# Patient Record
Sex: Male | Born: 1945 | Race: White | Hispanic: No | Smoking: Current every day smoker
Health system: Southern US, Community
[De-identification: ages and names within clinical notes are randomized; demographics above are authoritative.]

## PROBLEM LIST (undated history)

## (undated) DIAGNOSIS — I671 Cerebral aneurysm, nonruptured: Secondary | ICD-10-CM

## (undated) DIAGNOSIS — L0390 Cellulitis, unspecified: Secondary | ICD-10-CM

## (undated) DIAGNOSIS — E1140 Type 2 diabetes mellitus with diabetic neuropathy, unspecified: Secondary | ICD-10-CM

## (undated) DIAGNOSIS — J42 Unspecified chronic bronchitis: Secondary | ICD-10-CM

## (undated) DIAGNOSIS — H5440 Blindness, one eye, unspecified eye: Secondary | ICD-10-CM

## (undated) DIAGNOSIS — E119 Type 2 diabetes mellitus without complications: Secondary | ICD-10-CM

## (undated) DIAGNOSIS — M719 Bursopathy, unspecified: Secondary | ICD-10-CM

## (undated) DIAGNOSIS — E785 Hyperlipidemia, unspecified: Secondary | ICD-10-CM

## (undated) DIAGNOSIS — I10 Essential (primary) hypertension: Secondary | ICD-10-CM

---

## 1999-07-19 ENCOUNTER — Emergency Department (HOSPITAL_COMMUNITY): Admission: EM | Admit: 1999-07-19 | Discharge: 1999-07-19 | Payer: Self-pay | Admitting: Emergency Medicine

## 1999-07-20 ENCOUNTER — Emergency Department (HOSPITAL_COMMUNITY): Admission: EM | Admit: 1999-07-20 | Discharge: 1999-07-20 | Payer: Self-pay | Admitting: Emergency Medicine

## 2006-08-28 ENCOUNTER — Inpatient Hospital Stay (HOSPITAL_COMMUNITY): Admission: EM | Admit: 2006-08-28 | Discharge: 2006-08-30 | Payer: Self-pay | Admitting: Emergency Medicine

## 2015-04-08 ENCOUNTER — Encounter (HOSPITAL_COMMUNITY): Payer: Self-pay | Admitting: *Deleted

## 2015-04-08 ENCOUNTER — Emergency Department (HOSPITAL_COMMUNITY): Payer: Medicare Other

## 2015-04-08 ENCOUNTER — Inpatient Hospital Stay (HOSPITAL_COMMUNITY)
Admission: EM | Admit: 2015-04-08 | Discharge: 2015-04-15 | DRG: 871 | Disposition: A | Discharge status: Home Health | Payer: Medicare Other | Attending: Internal Medicine | Admitting: Internal Medicine

## 2015-04-08 DIAGNOSIS — I70209 Unspecified atherosclerosis of native arteries of extremities, unspecified extremity: Secondary | ICD-10-CM | POA: Diagnosis present

## 2015-04-08 DIAGNOSIS — Z7982 Long term (current) use of aspirin: Secondary | ICD-10-CM | POA: Diagnosis not present

## 2015-04-08 DIAGNOSIS — Z880 Allergy status to penicillin: Secondary | ICD-10-CM | POA: Diagnosis not present

## 2015-04-08 DIAGNOSIS — Z833 Family history of diabetes mellitus: Secondary | ICD-10-CM | POA: Diagnosis not present

## 2015-04-08 DIAGNOSIS — E872 Acidosis: Secondary | ICD-10-CM | POA: Diagnosis present

## 2015-04-08 DIAGNOSIS — I10 Essential (primary) hypertension: Secondary | ICD-10-CM | POA: Diagnosis present

## 2015-04-08 DIAGNOSIS — R739 Hyperglycemia, unspecified: Secondary | ICD-10-CM

## 2015-04-08 DIAGNOSIS — I714 Abdominal aortic aneurysm, without rupture: Secondary | ICD-10-CM | POA: Diagnosis present

## 2015-04-08 DIAGNOSIS — E1165 Type 2 diabetes mellitus with hyperglycemia: Secondary | ICD-10-CM | POA: Diagnosis present

## 2015-04-08 DIAGNOSIS — R7881 Bacteremia: Principal | ICD-10-CM | POA: Diagnosis present

## 2015-04-08 DIAGNOSIS — F17210 Nicotine dependence, cigarettes, uncomplicated: Secondary | ICD-10-CM | POA: Diagnosis present

## 2015-04-08 DIAGNOSIS — E860 Dehydration: Secondary | ICD-10-CM | POA: Diagnosis present

## 2015-04-08 DIAGNOSIS — I350 Nonrheumatic aortic (valve) stenosis: Secondary | ICD-10-CM | POA: Diagnosis present

## 2015-04-08 DIAGNOSIS — IMO0002 Reserved for concepts with insufficient information to code with codable children: Secondary | ICD-10-CM | POA: Insufficient documentation

## 2015-04-08 DIAGNOSIS — A419 Sepsis, unspecified organism: Secondary | ICD-10-CM | POA: Diagnosis not present

## 2015-04-08 DIAGNOSIS — M79651 Pain in right thigh: Secondary | ICD-10-CM | POA: Diagnosis not present

## 2015-04-08 DIAGNOSIS — E871 Hypo-osmolality and hyponatremia: Secondary | ICD-10-CM | POA: Diagnosis present

## 2015-04-08 DIAGNOSIS — J209 Acute bronchitis, unspecified: Secondary | ICD-10-CM | POA: Diagnosis present

## 2015-04-08 DIAGNOSIS — H5441 Blindness, right eye, normal vision left eye: Secondary | ICD-10-CM | POA: Diagnosis present

## 2015-04-08 DIAGNOSIS — F10239 Alcohol dependence with withdrawal, unspecified: Secondary | ICD-10-CM | POA: Diagnosis present

## 2015-04-08 DIAGNOSIS — F1010 Alcohol abuse, uncomplicated: Secondary | ICD-10-CM | POA: Diagnosis not present

## 2015-04-08 DIAGNOSIS — R400 Somnolence: Secondary | ICD-10-CM | POA: Diagnosis not present

## 2015-04-08 DIAGNOSIS — R29898 Other symptoms and signs involving the musculoskeletal system: Secondary | ICD-10-CM | POA: Diagnosis present

## 2015-04-08 DIAGNOSIS — B9562 Methicillin resistant Staphylococcus aureus infection as the cause of diseases classified elsewhere: Secondary | ICD-10-CM | POA: Diagnosis present

## 2015-04-08 DIAGNOSIS — E1169 Type 2 diabetes mellitus with other specified complication: Secondary | ICD-10-CM | POA: Diagnosis present

## 2015-04-08 DIAGNOSIS — I671 Cerebral aneurysm, nonruptured: Secondary | ICD-10-CM | POA: Diagnosis present

## 2015-04-08 DIAGNOSIS — G9340 Encephalopathy, unspecified: Secondary | ICD-10-CM | POA: Diagnosis present

## 2015-04-08 DIAGNOSIS — E785 Hyperlipidemia, unspecified: Secondary | ICD-10-CM | POA: Diagnosis present

## 2015-04-08 DIAGNOSIS — F1020 Alcohol dependence, uncomplicated: Secondary | ICD-10-CM | POA: Diagnosis not present

## 2015-04-08 DIAGNOSIS — E1310 Other specified diabetes mellitus with ketoacidosis without coma: Secondary | ICD-10-CM | POA: Diagnosis not present

## 2015-04-08 DIAGNOSIS — Z5329 Procedure and treatment not carried out because of patient's decision for other reasons: Secondary | ICD-10-CM | POA: Diagnosis present

## 2015-04-08 DIAGNOSIS — Z720 Tobacco use: Secondary | ICD-10-CM | POA: Diagnosis present

## 2015-04-08 DIAGNOSIS — M25559 Pain in unspecified hip: Secondary | ICD-10-CM

## 2015-04-08 DIAGNOSIS — E1110 Type 2 diabetes mellitus with ketoacidosis without coma: Secondary | ICD-10-CM | POA: Diagnosis present

## 2015-04-08 DIAGNOSIS — A4901 Methicillin susceptible Staphylococcus aureus infection, unspecified site: Secondary | ICD-10-CM | POA: Diagnosis not present

## 2015-04-08 DIAGNOSIS — E876 Hypokalemia: Secondary | ICD-10-CM | POA: Diagnosis not present

## 2015-04-08 DIAGNOSIS — M729 Fibroblastic disorder, unspecified: Secondary | ICD-10-CM | POA: Diagnosis present

## 2015-04-08 DIAGNOSIS — R011 Cardiac murmur, unspecified: Secondary | ICD-10-CM | POA: Diagnosis not present

## 2015-04-08 HISTORY — DX: Essential (primary) hypertension: I10

## 2015-04-08 HISTORY — DX: Blindness, one eye, unspecified eye: H54.40

## 2015-04-08 HISTORY — DX: Cerebral aneurysm, nonruptured: I67.1

## 2015-04-08 HISTORY — DX: Hyperlipidemia, unspecified: E78.5

## 2015-04-08 LAB — BASIC METABOLIC PANEL
ANION GAP: 20 — AB (ref 5–15)
Anion gap: 17 — ABNORMAL HIGH (ref 5–15)
BUN: 13 mg/dL (ref 6–20)
BUN: 15 mg/dL (ref 6–20)
CALCIUM: 9.7 mg/dL (ref 8.9–10.3)
CALCIUM: 9 mg/dL (ref 8.9–10.3)
CHLORIDE: 93 mmol/L — AB (ref 101–111)
CO2: 18 mmol/L — AB (ref 22–32)
CO2: 18 mmol/L — ABNORMAL LOW (ref 22–32)
CREATININE: 1.08 mg/dL (ref 0.61–1.24)
Chloride: 96 mmol/L — ABNORMAL LOW (ref 101–111)
Creatinine, Ser: 1.29 mg/dL — ABNORMAL HIGH (ref 0.61–1.24)
GFR calc non Af Amer: 55 mL/min — ABNORMAL LOW (ref >60)
Glucose, Bld: 424 mg/dL — ABNORMAL HIGH (ref 65–99)
Glucose, Bld: 297 mg/dL — ABNORMAL HIGH (ref 65–99)
Potassium: 4.8 mmol/L (ref 3.5–5.1)
Potassium: 4.2 mmol/L (ref 3.5–5.1)
SODIUM: 131 mmol/L — AB (ref 135–145)
Sodium: 131 mmol/L — ABNORMAL LOW (ref 135–145)

## 2015-04-08 LAB — CBG MONITORING, ED
GLUCOSE-CAPILLARY: 456 mg/dL — AB (ref 65–99)
GLUCOSE-CAPILLARY: 337 mg/dL — AB (ref 65–99)
Glucose-Capillary: 447 mg/dL — ABNORMAL HIGH (ref 65–99)
Glucose-Capillary: 235 mg/dL — ABNORMAL HIGH (ref 65–99)
Glucose-Capillary: 236 mg/dL — ABNORMAL HIGH (ref 65–99)

## 2015-04-08 LAB — CBC
HCT: 47.4 % (ref 39.0–52.0)
HEMOGLOBIN: 17 g/dL (ref 13.0–17.0)
MCH: 31.8 pg (ref 26.0–34.0)
MCHC: 35.9 g/dL (ref 30.0–36.0)
MCV: 88.6 fL (ref 78.0–100.0)
Platelets: 223 10*3/uL (ref 150–400)
RBC: 5.35 MIL/uL (ref 4.22–5.81)
RDW: 12.7 % (ref 11.5–15.5)
WBC: 17.7 10*3/uL — ABNORMAL HIGH (ref 4.0–10.5)

## 2015-04-08 LAB — ETHANOL: Alcohol, Ethyl (B): <5 mg/dL (ref <5)

## 2015-04-08 LAB — I-STAT CG4 LACTIC ACID, ED
LACTIC ACID, VENOUS: 3 mmol/L — AB (ref 0.5–2.0)
Lactic Acid, Venous: 1.14 mmol/L (ref 0.5–2.0)

## 2015-04-08 LAB — HEPATIC FUNCTION PANEL
ALBUMIN: 3 g/dL — AB (ref 3.5–5.0)
ALT: 13 U/L — AB (ref 17–63)
AST: 31 U/L (ref 15–41)
Alkaline Phosphatase: 75 U/L (ref 38–126)
BILIRUBIN DIRECT: 0.1 mg/dL (ref 0.1–0.5)
BILIRUBIN TOTAL: 0.9 mg/dL (ref 0.3–1.2)
Indirect Bilirubin: 0.8 mg/dL (ref 0.3–0.9)
Total Protein: 6 g/dL — ABNORMAL LOW (ref 6.5–8.1)

## 2015-04-08 LAB — URINALYSIS, ROUTINE W REFLEX MICROSCOPIC
Ketones, ur: >80 mg/dL — AB
Nitrite: NEGATIVE
Specific Gravity, Urine: 1.026 (ref 1.005–1.030)
Urobilinogen, UA: 0.2 mg/dL (ref 0.0–1.0)
pH: 5.5 (ref 5.0–8.0)

## 2015-04-08 LAB — LIPASE, BLOOD: Lipase: 12 U/L — ABNORMAL LOW (ref 22–51)

## 2015-04-08 LAB — URINE MICROSCOPIC-ADD ON

## 2015-04-08 LAB — AMMONIA: Ammonia: 19 umol/L (ref 9–35)

## 2015-04-08 MED ORDER — SODIUM CHLORIDE 0.9 % IV BOLUS (SEPSIS)
500.0000 mL | Freq: Once | INTRAVENOUS | Status: AC
  Administered 2015-04-08: 500 mL via INTRAVENOUS

## 2015-04-08 MED ORDER — KETOROLAC TROMETHAMINE 30 MG/ML IJ SOLN
15.0000 mg | Freq: Once | INTRAMUSCULAR | Status: AC
  Administered 2015-04-09: 15 mg via INTRAVENOUS
  Filled 2015-04-08: qty 1

## 2015-04-08 MED ORDER — INSULIN ASPART 100 UNIT/ML ~~LOC~~ SOLN
10.0000 [IU] | Freq: Once | SUBCUTANEOUS | Status: AC
  Administered 2015-04-08: 10 [IU] via INTRAVENOUS
  Filled 2015-04-08: qty 1

## 2015-04-08 MED ORDER — THIAMINE HCL 100 MG/ML IJ SOLN
Freq: Once | INTRAVENOUS | Status: AC
  Administered 2015-04-09: 04:00:00 via INTRAVENOUS
  Filled 2015-04-08: qty 1000

## 2015-04-08 MED ORDER — ASPIRIN EC 81 MG PO TBEC
81.0000 mg | DELAYED_RELEASE_TABLET | Freq: Every day | ORAL | Status: DC
  Administered 2015-04-09 – 2015-04-15 (×6): 81 mg via ORAL
  Filled 2015-04-08 (×7): qty 1

## 2015-04-08 MED ORDER — METFORMIN HCL 500 MG PO TABS: 1000.0000 mg | ORAL_TABLET | Freq: Two times a day (BID) | ORAL | Status: DC

## 2015-04-08 MED ORDER — SODIUM CHLORIDE 0.9 % IV SOLN
INTRAVENOUS | Status: DC
  Administered 2015-04-08: 20:00:00 via INTRAVENOUS

## 2015-04-08 MED ORDER — LORAZEPAM 2 MG/ML IJ SOLN: 1.0000 mg | Freq: Once | INTRAMUSCULAR | Status: AC

## 2015-04-08 MED ORDER — METOPROLOL SUCCINATE ER 50 MG PO TB24
50.0000 mg | ORAL_TABLET | Freq: Every day | ORAL | Status: DC
  Administered 2015-04-09: 50 mg via ORAL
  Filled 2015-04-08: qty 1

## 2015-04-08 MED ORDER — LINAGLIPTIN 5 MG PO TABS: 5.0000 mg | ORAL_TABLET | Freq: Every day | ORAL | Status: DC

## 2015-04-08 MED ORDER — ENOXAPARIN SODIUM 40 MG/0.4ML ~~LOC~~ SOLN
40.0000 mg | Freq: Every day | SUBCUTANEOUS | Status: DC
  Administered 2015-04-09 – 2015-04-15 (×7): 40 mg via SUBCUTANEOUS
  Filled 2015-04-08 (×7): qty 0.4

## 2015-04-08 MED ORDER — ONDANSETRON HCL 4 MG PO TABS: 4.0000 mg | ORAL_TABLET | Freq: Four times a day (QID) | ORAL | Status: DC | PRN

## 2015-04-08 MED ORDER — VITAMIN B-1 100 MG PO TABS
100.0000 mg | ORAL_TABLET | Freq: Every day | ORAL | Status: DC
  Administered 2015-04-09: 100 mg via ORAL
  Filled 2015-04-08: qty 1

## 2015-04-08 MED ORDER — FAMOTIDINE 20 MG PO TABS
20.0000 mg | ORAL_TABLET | Freq: Two times a day (BID) | ORAL | Status: DC
  Administered 2015-04-09 (×2): 20 mg via ORAL
  Filled 2015-04-08 (×2): qty 1

## 2015-04-08 MED ORDER — ONDANSETRON HCL 4 MG/2ML IJ SOLN: 4.0000 mg | Freq: Four times a day (QID) | INTRAMUSCULAR | Status: DC | PRN

## 2015-04-08 NOTE — ED Notes (Signed)
Pt incontinent of urine, pt cleaned and dried.  Red area noted at rectum
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
## 2015-04-08 NOTE — ED Notes (Signed)
CBG 236. 

## 2015-04-08 NOTE — ED Notes (Signed)
Pt friend reports rt leg pain and difficulty walking that has worsened in the last 2-3 days. Pt also reports "error" reading on CBG machine and has not been checking blood sugars. Denies any dizziness or lightheadedness. Pt reports increased urination.

## 2015-04-09 ENCOUNTER — Inpatient Hospital Stay (HOSPITAL_COMMUNITY): Payer: Medicare Other

## 2015-04-09 ENCOUNTER — Encounter (HOSPITAL_COMMUNITY): Payer: Self-pay

## 2015-04-09 DIAGNOSIS — R509 Fever, unspecified: Secondary | ICD-10-CM | POA: Insufficient documentation

## 2015-04-09 DIAGNOSIS — D72829 Elevated white blood cell count, unspecified: Secondary | ICD-10-CM | POA: Insufficient documentation

## 2015-04-09 DIAGNOSIS — E1310 Other specified diabetes mellitus with ketoacidosis without coma: Secondary | ICD-10-CM

## 2015-04-09 DIAGNOSIS — R29898 Other symptoms and signs involving the musculoskeletal system: Secondary | ICD-10-CM

## 2015-04-09 DIAGNOSIS — R011 Cardiac murmur, unspecified: Secondary | ICD-10-CM

## 2015-04-09 DIAGNOSIS — Z720 Tobacco use: Secondary | ICD-10-CM | POA: Diagnosis present

## 2015-04-09 DIAGNOSIS — J209 Acute bronchitis, unspecified: Secondary | ICD-10-CM | POA: Insufficient documentation

## 2015-04-09 DIAGNOSIS — E785 Hyperlipidemia, unspecified: Secondary | ICD-10-CM | POA: Diagnosis present

## 2015-04-09 DIAGNOSIS — G9340 Encephalopathy, unspecified: Secondary | ICD-10-CM

## 2015-04-09 DIAGNOSIS — F1010 Alcohol abuse, uncomplicated: Secondary | ICD-10-CM

## 2015-04-09 DIAGNOSIS — I10 Essential (primary) hypertension: Secondary | ICD-10-CM | POA: Diagnosis present

## 2015-04-09 DIAGNOSIS — E1110 Type 2 diabetes mellitus with ketoacidosis without coma: Secondary | ICD-10-CM | POA: Diagnosis present

## 2015-04-09 DIAGNOSIS — E1165 Type 2 diabetes mellitus with hyperglycemia: Secondary | ICD-10-CM

## 2015-04-09 LAB — GLUCOSE, CAPILLARY
Glucose-Capillary: 333 mg/dL — ABNORMAL HIGH (ref 65–99)
Glucose-Capillary: 213 mg/dL — ABNORMAL HIGH (ref 65–99)
Glucose-Capillary: 189 mg/dL — ABNORMAL HIGH (ref 65–99)
Glucose-Capillary: 280 mg/dL — ABNORMAL HIGH (ref 65–99)

## 2015-04-09 LAB — COMPREHENSIVE METABOLIC PANEL
ALBUMIN: 2.7 g/dL — AB (ref 3.5–5.0)
ALK PHOS: 72 U/L (ref 38–126)
ALT: 13 U/L — ABNORMAL LOW (ref 17–63)
ANION GAP: 14 (ref 5–15)
AST: 25 U/L (ref 15–41)
BILIRUBIN TOTAL: 1 mg/dL (ref 0.3–1.2)
BUN: 16 mg/dL (ref 6–20)
CALCIUM: 8.6 mg/dL — AB (ref 8.9–10.3)
CO2: 19 mmol/L — ABNORMAL LOW (ref 22–32)
Chloride: 97 mmol/L — ABNORMAL LOW (ref 101–111)
Creatinine, Ser: 1.04 mg/dL (ref 0.61–1.24)
GFR calc non Af Amer: >60 mL/min (ref >60)
Glucose, Bld: 322 mg/dL — ABNORMAL HIGH (ref 65–99)
POTASSIUM: 4 mmol/L (ref 3.5–5.1)
SODIUM: 130 mmol/L — AB (ref 135–145)
TOTAL PROTEIN: 5.4 g/dL — AB (ref 6.5–8.1)

## 2015-04-09 LAB — CBC
HEMATOCRIT: 43 % (ref 39.0–52.0)
HEMOGLOBIN: 15.1 g/dL (ref 13.0–17.0)
MCH: 31 pg (ref 26.0–34.0)
MCHC: 35.1 g/dL (ref 30.0–36.0)
MCV: 88.3 fL (ref 78.0–100.0)
Platelets: 206 10*3/uL (ref 150–400)
RBC: 4.87 MIL/uL (ref 4.22–5.81)
RDW: 12.9 % (ref 11.5–15.5)
WBC: 16.5 10*3/uL — ABNORMAL HIGH (ref 4.0–10.5)

## 2015-04-09 LAB — BASIC METABOLIC PANEL
ANION GAP: 11 (ref 5–15)
Anion gap: 10 (ref 5–15)
BUN: 15 mg/dL (ref 6–20)
BUN: 14 mg/dL (ref 6–20)
CHLORIDE: 100 mmol/L — AB (ref 101–111)
CO2: 21 mmol/L — AB (ref 22–32)
CO2: 18 mmol/L — AB (ref 22–32)
Calcium: 8.7 mg/dL — ABNORMAL LOW (ref 8.9–10.3)
Calcium: 8.3 mg/dL — ABNORMAL LOW (ref 8.9–10.3)
Chloride: 101 mmol/L (ref 101–111)
Creatinine, Ser: 0.88 mg/dL (ref 0.61–1.24)
Creatinine, Ser: 0.85 mg/dL (ref 0.61–1.24)
GFR calc Af Amer: >60 mL/min (ref >60)
GFR calc non Af Amer: >60 mL/min (ref >60)
GFR calc non Af Amer: >60 mL/min (ref >60)
GLUCOSE: 225 mg/dL — AB (ref 65–99)
GLUCOSE: 207 mg/dL — AB (ref 65–99)
POTASSIUM: 3.5 mmol/L (ref 3.5–5.1)
Potassium: 3.7 mmol/L (ref 3.5–5.1)
SODIUM: 131 mmol/L — AB (ref 135–145)
Sodium: 130 mmol/L — ABNORMAL LOW (ref 135–145)

## 2015-04-09 LAB — VITAMIN B12: Vitamin B-12: 572 pg/mL (ref 180–914)

## 2015-04-09 LAB — TSH: TSH: 0.947 u[IU]/mL (ref 0.350–4.500)

## 2015-04-09 LAB — PHOSPHORUS: PHOSPHORUS: 3.2 mg/dL (ref 2.5–4.6)

## 2015-04-09 LAB — CK: CK TOTAL: 338 U/L (ref 49–397)

## 2015-04-09 LAB — MAGNESIUM: Magnesium: 1.5 mg/dL — ABNORMAL LOW (ref 1.7–2.4)

## 2015-04-09 MED ORDER — LORAZEPAM 2 MG/ML IJ SOLN
0.0000 mg | Freq: Four times a day (QID) | INTRAMUSCULAR | Status: AC
  Administered 2015-04-09 (×2): 2 mg via INTRAVENOUS
  Filled 2015-04-09 (×4): qty 1

## 2015-04-09 MED ORDER — POTASSIUM CHLORIDE 2 MEQ/ML IV SOLN
INTRAVENOUS | Status: DC
  Administered 2015-04-09 (×2): via INTRAVENOUS
  Filled 2015-04-09 (×4): qty 1000

## 2015-04-09 MED ORDER — SODIUM CHLORIDE 0.9 % IV SOLN
1500.0000 mg | Freq: Once | INTRAVENOUS | Status: AC
  Administered 2015-04-09: 1500 mg via INTRAVENOUS
  Filled 2015-04-09: qty 1500

## 2015-04-09 MED ORDER — THIAMINE HCL 100 MG/ML IJ SOLN
100.0000 mg | Freq: Every day | INTRAMUSCULAR | Status: DC
  Administered 2015-04-10 – 2015-04-12 (×3): 100 mg via INTRAVENOUS
  Filled 2015-04-09 (×3): qty 2

## 2015-04-09 MED ORDER — IPRATROPIUM-ALBUTEROL 0.5-2.5 (3) MG/3ML IN SOLN
3.0000 mL | Freq: Two times a day (BID) | RESPIRATORY_TRACT | Status: DC
  Administered 2015-04-10 – 2015-04-14 (×8): 3 mL via RESPIRATORY_TRACT
  Filled 2015-04-09 (×10): qty 3

## 2015-04-09 MED ORDER — IPRATROPIUM-ALBUTEROL 0.5-2.5 (3) MG/3ML IN SOLN: 3.0000 mL | RESPIRATORY_TRACT | Status: DC | PRN

## 2015-04-09 MED ORDER — LEVOFLOXACIN IN D5W 750 MG/150ML IV SOLN
750.0000 mg | INTRAVENOUS | Status: DC
  Administered 2015-04-09: 750 mg via INTRAVENOUS
  Filled 2015-04-09 (×2): qty 150

## 2015-04-09 MED ORDER — INSULIN ASPART 100 UNIT/ML ~~LOC~~ SOLN
0.0000 [IU] | Freq: Every day | SUBCUTANEOUS | Status: DC
  Administered 2015-04-09: 3 [IU] via SUBCUTANEOUS
  Administered 2015-04-12 – 2015-04-13 (×2): 2 [IU] via SUBCUTANEOUS
  Administered 2015-04-14: 3 [IU] via SUBCUTANEOUS

## 2015-04-09 MED ORDER — SODIUM CHLORIDE 0.9 % IV BOLUS (SEPSIS)
500.0000 mL | Freq: Once | INTRAVENOUS | Status: AC
  Administered 2015-04-09: 500 mL via INTRAVENOUS

## 2015-04-09 MED ORDER — METOPROLOL TARTRATE 1 MG/ML IV SOLN
2.5000 mg | Freq: Four times a day (QID) | INTRAVENOUS | Status: DC
  Administered 2015-04-10: 2.5 mg via INTRAVENOUS
  Filled 2015-04-09: qty 5

## 2015-04-09 MED ORDER — ADULT MULTIVITAMIN W/MINERALS CH
1.0000 | ORAL_TABLET | Freq: Every day | ORAL | Status: DC
  Administered 2015-04-09: 1 via ORAL
  Filled 2015-04-09: qty 1

## 2015-04-09 MED ORDER — POTASSIUM CHLORIDE IN NACL 20-0.9 MEQ/L-% IV SOLN
INTRAVENOUS | Status: DC
  Administered 2015-04-10 – 2015-04-11 (×5): via INTRAVENOUS
  Filled 2015-04-09 (×4): qty 1000

## 2015-04-09 MED ORDER — FOLIC ACID 1 MG PO TABS
1.0000 mg | ORAL_TABLET | Freq: Every day | ORAL | Status: DC
  Administered 2015-04-09: 1 mg via ORAL
  Filled 2015-04-09: qty 1

## 2015-04-09 MED ORDER — LORAZEPAM 2 MG/ML IJ SOLN
0.0000 mg | Freq: Two times a day (BID) | INTRAMUSCULAR | Status: DC
  Administered 2015-04-10 – 2015-04-11 (×2): 2 mg via INTRAVENOUS
  Filled 2015-04-09 (×2): qty 1

## 2015-04-09 MED ORDER — NICOTINE 21 MG/24HR TD PT24
21.0000 mg | MEDICATED_PATCH | Freq: Every day | TRANSDERMAL | Status: DC | PRN
  Administered 2015-04-13: 21 mg via TRANSDERMAL
  Filled 2015-04-09: qty 1

## 2015-04-09 MED ORDER — INSULIN ASPART 100 UNIT/ML ~~LOC~~ SOLN
0.0000 [IU] | SUBCUTANEOUS | Status: DC
  Administered 2015-04-09: 11 [IU] via SUBCUTANEOUS
  Administered 2015-04-09: 5 [IU] via SUBCUTANEOUS

## 2015-04-09 MED ORDER — IPRATROPIUM-ALBUTEROL 0.5-2.5 (3) MG/3ML IN SOLN
3.0000 mL | Freq: Four times a day (QID) | RESPIRATORY_TRACT | Status: DC
  Administered 2015-04-09 (×2): 3 mL via RESPIRATORY_TRACT
  Filled 2015-04-09 (×3): qty 3

## 2015-04-09 MED ORDER — LORAZEPAM 1 MG PO TABS: 1.0000 mg | ORAL_TABLET | Freq: Four times a day (QID) | ORAL | Status: AC | PRN

## 2015-04-09 MED ORDER — INSULIN ASPART 100 UNIT/ML ~~LOC~~ SOLN
0.0000 [IU] | SUBCUTANEOUS | Status: DC
  Administered 2015-04-09: 2 [IU] via SUBCUTANEOUS
  Administered 2015-04-10 (×2): 3 [IU] via SUBCUTANEOUS
  Administered 2015-04-10: 2 [IU] via SUBCUTANEOUS
  Administered 2015-04-10: 5 [IU] via SUBCUTANEOUS
  Administered 2015-04-10 (×2): 3 [IU] via SUBCUTANEOUS
  Administered 2015-04-11: 2 [IU] via SUBCUTANEOUS
  Administered 2015-04-11 (×2): 1 [IU] via SUBCUTANEOUS
  Administered 2015-04-12: 7 [IU] via SUBCUTANEOUS
  Administered 2015-04-12: 2 [IU] via SUBCUTANEOUS
  Administered 2015-04-12: 1 [IU] via SUBCUTANEOUS
  Administered 2015-04-12: 2 [IU] via SUBCUTANEOUS
  Administered 2015-04-12: 3 [IU] via SUBCUTANEOUS
  Administered 2015-04-12: 5 [IU] via SUBCUTANEOUS
  Administered 2015-04-13 (×2): 3 [IU] via SUBCUTANEOUS
  Administered 2015-04-13: 5 [IU] via SUBCUTANEOUS
  Administered 2015-04-13 – 2015-04-14 (×2): 2 [IU] via SUBCUTANEOUS
  Administered 2015-04-14: 3 [IU] via SUBCUTANEOUS
  Administered 2015-04-14: 5 [IU] via SUBCUTANEOUS

## 2015-04-09 MED ORDER — VANCOMYCIN HCL IN DEXTROSE 1-5 GM/200ML-% IV SOLN
1000.0000 mg | Freq: Two times a day (BID) | INTRAVENOUS | Status: DC
  Administered 2015-04-09 – 2015-04-11 (×5): 1000 mg via INTRAVENOUS
  Filled 2015-04-09 (×7): qty 200

## 2015-04-09 MED ORDER — FAMOTIDINE IN NACL 20-0.9 MG/50ML-% IV SOLN
20.0000 mg | Freq: Two times a day (BID) | INTRAVENOUS | Status: DC
  Administered 2015-04-09 – 2015-04-11 (×5): 20 mg via INTRAVENOUS
  Filled 2015-04-09 (×8): qty 50

## 2015-04-09 MED ORDER — LORAZEPAM 2 MG/ML IJ SOLN
1.0000 mg | Freq: Four times a day (QID) | INTRAMUSCULAR | Status: AC | PRN
  Administered 2015-04-09 – 2015-04-11 (×2): 1 mg via INTRAVENOUS
  Filled 2015-04-09 (×2): qty 1

## 2015-04-09 MED ORDER — INSULIN ASPART 100 UNIT/ML ~~LOC~~ SOLN: 0.0000 [IU] | Freq: Every day | SUBCUTANEOUS | Status: DC

## 2015-04-09 MED ORDER — INSULIN GLARGINE 100 UNIT/ML ~~LOC~~ SOLN
10.0000 [IU] | Freq: Every day | SUBCUTANEOUS | Status: DC
  Administered 2015-04-09: 10 [IU] via SUBCUTANEOUS
  Filled 2015-04-09 (×2): qty 0.1

## 2015-04-09 MED ORDER — INSULIN ASPART 100 UNIT/ML ~~LOC~~ SOLN: 0.0000 [IU] | Freq: Three times a day (TID) | SUBCUTANEOUS | Status: DC

## 2015-04-09 NOTE — Progress Notes (Signed)
Pt came from ED to room 6N26; pt is asleep; daughter at bedside. Will continue to monitor
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
## 2015-04-09 NOTE — Progress Notes (Signed)
  Echocardiogram 2D Echocardiogram has been performed.  Andrew [name] 04/09/2015, 9:32 AM

## 2015-04-09 NOTE — Progress Notes (Signed)
CRITICAL VALUE ALERT  Critical value received: 2725 Date of notification:  04/09/15  Time of notification: 0852  Critical value read back: yes  Nurse who received alert: Jeralene Peters MD notified (1st page):  D. Tat Time of first page: [phone] MD notified (2nd page):  Time of second page:  Responding MD: D. Tat Time MD responded:  [phone]

## 2015-04-09 NOTE — Progress Notes (Signed)
Inpatient Diabetes Program Recommendations  AACE/ADA: New Consensus Statement on Inpatient Glycemic Control (2013)  Target Ranges:  Prepandial:   less than 140 mg/dL      Peak postprandial:   less than 180 mg/dL (1-2 hours)      Critically ill patients:  140 - 180 mg/dL   Review of Glycemic Control:  Results for VISHAAL, STROLLO (MRN [id number]) as of 04/09/2015 11:36  Ref. Range 04/08/2015 18:20 04/08/2015 19:58 04/08/2015 23:07 04/09/2015 07:48 04/09/2015 11:04  Glucose-Capillary Latest Ref Range: 65-99 mg/dL 337 (H) 235 (H) 236 (H) 333 (H) 213 (H)   A1C pending.  Diabetes history: Type 2 diabetes Outpatient Diabetes medications: Januvia 100 mg daily and Metformin 1000 mg bid Current orders for Inpatient glycemic control:  Novolog moderate tid with meals and HS  Please consider starting basal insulin while patient is in the hospital.  Consider Lantus 16 units daily (0.2 unit/kg).  While NPO, consider increasing Novolog frequency to q 4 hours and change scale to sensitive.    Note that patient was on Metformin at home prior to admit, however due to history of ETOH abuse, may consider not restarting Metformin at discharge?  Will follow.  Thanks, Adah Perl, RN, BC-ADM Inpatient Diabetes Coordinator Pager [phone] (8a-5p)
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
## 2015-04-09 NOTE — Progress Notes (Signed)
Utilization review completed. Terik Haughey, RN, BSN. 

## 2015-04-09 NOTE — Progress Notes (Signed)
ANTIBIOTIC CONSULT NOTE - INITIAL  Pharmacy Consult for vancomycin Indication: Bacteremia  Allergies  Allergen Reactions  . Penicillins Rash and Other (See Comments)    Possibly rash?    Patient Measurements: Weight: 182 lb 5.1 oz (82.7 kg)  Vital Signs: Temp: 98.3 F (36.8 C) (08/23 0538) Temp Source: Oral (08/23 0538) BP: 154/65 mmHg (08/23 0538) Pulse Rate: 78 (08/23 0538) Intake/Output from previous day: 08/22 0701 - 08/23 0700 In: 555.4 [P.O.:220; I.V.:185.4; IV Piggyback:150] Out: -  Intake/Output from this shift: Total I/O In: 0  Out: 200 [Urine:200]  Labs:  Recent Labs  04/08/15 1416 04/08/15 1916 04/09/15 0502  WBC 17.7*  --  16.5*  HGB 17.0  --  15.1  PLT 223  --  206  CREATININE 1.29* 1.08 1.04   CrCl cannot be calculated (Unknown ideal weight.). No results for input(s): VANCOTROUGH, VANCOPEAK, VANCORANDOM, GENTTROUGH, GENTPEAK, GENTRANDOM, TOBRATROUGH, TOBRAPEAK, TOBRARND, AMIKACINPEAK, AMIKACINTROU, AMIKACIN in the last 72 hours.   Microbiology: Recent Results (from the past 720 hour(s))  Culture, blood (routine x 2)     Status: None (Preliminary result)   Collection Time: 04/08/15  8:15 PM  Result Value Ref Range Status   Specimen Description BLOOD RIGHT ARM  Final   Special Requests BOTTLES DRAWN AEROBIC AND ANAEROBIC 10ML  Final   Culture  Setup Time   Final    GRAM POSITIVE COCCI IN CLUSTERS ANAEROBIC BOTTLE ONLY CRITICAL RESULT CALLED TO, READ BACK BY AND VERIFIED WITH: J. BOZEMAN,RN AT 0848 ON [id number] BY Rhea Bleacher    Culture GRAM POSITIVE COCCI  Final   Report Status PENDING  Incomplete    Medical History: Past Medical History  Diagnosis Date  . Diabetes mellitus without complication   . Hypertension   . Hyperlipemia   . Blind right eye    Assessment: 69 yo M presents on 8/22 with R leg pain and DM. Levaquin was started yesterday for acute bronchitis and low grade fever. Found to have 1/2 positive blood cx's for GPC in clusters  so pharmacy consulted to add vancomycin. Now afebrile, WBC elevated at 16.5. SCr 1.04, CrCl ~70-29ml/min.  Goal of Therapy:  Vancomycin trough level 15-20 mcg/ml  Resolution of infection  Plan:  Give vancomycin 1500mg  IV x 1, then start 1g IV Q12H Continue Levaquin 750mg  IV Q24 per MD Monitor clinical picture, renal function, VT prn F/U C&S, abx deescalation / LOT  Lucindia Lemley J 04/09/2015,9:05 AM

## 2015-04-10 ENCOUNTER — Inpatient Hospital Stay (HOSPITAL_COMMUNITY): Payer: Medicare Other

## 2015-04-10 DIAGNOSIS — B9561 Methicillin susceptible Staphylococcus aureus infection as the cause of diseases classified elsewhere: Secondary | ICD-10-CM

## 2015-04-10 DIAGNOSIS — R7881 Bacteremia: Secondary | ICD-10-CM | POA: Diagnosis present

## 2015-04-10 DIAGNOSIS — F1020 Alcohol dependence, uncomplicated: Secondary | ICD-10-CM

## 2015-04-10 DIAGNOSIS — I70209 Unspecified atherosclerosis of native arteries of extremities, unspecified extremity: Secondary | ICD-10-CM | POA: Diagnosis present

## 2015-04-10 DIAGNOSIS — M79651 Pain in right thigh: Secondary | ICD-10-CM | POA: Diagnosis present

## 2015-04-10 DIAGNOSIS — Z720 Tobacco use: Secondary | ICD-10-CM

## 2015-04-10 DIAGNOSIS — A4901 Methicillin susceptible Staphylococcus aureus infection, unspecified site: Secondary | ICD-10-CM

## 2015-04-10 LAB — RAPID URINE DRUG SCREEN, HOSP PERFORMED
AMPHETAMINES: NOT DETECTED
BENZODIAZEPINES: POSITIVE — AB
Barbiturates: NOT DETECTED
Cocaine: NOT DETECTED
OPIATES: NOT DETECTED
Tetrahydrocannabinol: NOT DETECTED

## 2015-04-10 LAB — BASIC METABOLIC PANEL
ANION GAP: 16 — AB (ref 5–15)
BUN: 13 mg/dL (ref 6–20)
CO2: 17 mmol/L — AB (ref 22–32)
Calcium: 8.5 mg/dL — ABNORMAL LOW (ref 8.9–10.3)
Chloride: 100 mmol/L — ABNORMAL LOW (ref 101–111)
Creatinine, Ser: 1.03 mg/dL (ref 0.61–1.24)
GFR calc Af Amer: >60 mL/min (ref >60)
GLUCOSE: 297 mg/dL — AB (ref 65–99)
POTASSIUM: 3.6 mmol/L (ref 3.5–5.1)
Sodium: 133 mmol/L — ABNORMAL LOW (ref 135–145)

## 2015-04-10 LAB — CBC
HEMATOCRIT: 42.3 % (ref 39.0–52.0)
HEMOGLOBIN: 14.9 g/dL (ref 13.0–17.0)
MCH: 31.2 pg (ref 26.0–34.0)
MCHC: 35.2 g/dL (ref 30.0–36.0)
MCV: 88.5 fL (ref 78.0–100.0)
Platelets: 191 10*3/uL (ref 150–400)
RBC: 4.78 MIL/uL (ref 4.22–5.81)
RDW: 13.1 % (ref 11.5–15.5)
WBC: 12.7 10*3/uL — ABNORMAL HIGH (ref 4.0–10.5)

## 2015-04-10 LAB — GLUCOSE, CAPILLARY
GLUCOSE-CAPILLARY: 267 mg/dL — AB (ref 65–99)
GLUCOSE-CAPILLARY: 205 mg/dL — AB (ref 65–99)
Glucose-Capillary: 210 mg/dL — ABNORMAL HIGH (ref 65–99)
Glucose-Capillary: 207 mg/dL — ABNORMAL HIGH (ref 65–99)
Glucose-Capillary: 159 mg/dL — ABNORMAL HIGH (ref 65–99)
Glucose-Capillary: 209 mg/dL — ABNORMAL HIGH (ref 65–99)

## 2015-04-10 LAB — URINE CULTURE

## 2015-04-10 LAB — HEMOGLOBIN A1C
Hgb A1c MFr Bld: 13.8 % — ABNORMAL HIGH (ref 4.8–5.6)
MEAN PLASMA GLUCOSE: 349 mg/dL

## 2015-04-10 MED ORDER — SODIUM BICARBONATE 650 MG PO TABS
650.0000 mg | ORAL_TABLET | Freq: Two times a day (BID) | ORAL | Status: AC
  Administered 2015-04-10 (×2): 650 mg via ORAL
  Filled 2015-04-10 (×2): qty 1

## 2015-04-10 MED ORDER — CHLORHEXIDINE GLUCONATE 0.12 % MT SOLN
15.0000 mL | Freq: Two times a day (BID) | OROMUCOSAL | Status: DC
  Administered 2015-04-10 – 2015-04-14 (×9): 15 mL via OROMUCOSAL
  Filled 2015-04-10 (×7): qty 15

## 2015-04-10 MED ORDER — METOPROLOL TARTRATE 1 MG/ML IV SOLN
5.0000 mg | Freq: Four times a day (QID) | INTRAVENOUS | Status: DC
  Administered 2015-04-10 – 2015-04-12 (×8): 5 mg via INTRAVENOUS
  Filled 2015-04-10 (×8): qty 5

## 2015-04-10 MED ORDER — SODIUM CHLORIDE 0.9 % IV BOLUS (SEPSIS)
1000.0000 mL | Freq: Once | INTRAVENOUS | Status: AC
  Administered 2015-04-10: 1000 mL via INTRAVENOUS

## 2015-04-10 MED ORDER — IOHEXOL 300 MG/ML  SOLN
100.0000 mL | Freq: Once | INTRAMUSCULAR | Status: AC | PRN
  Administered 2015-04-10: 100 mL via INTRAVENOUS

## 2015-04-10 MED ORDER — INSULIN GLARGINE 100 UNIT/ML ~~LOC~~ SOLN
16.0000 [IU] | Freq: Every day | SUBCUTANEOUS | Status: DC
  Administered 2015-04-10 – 2015-04-13 (×4): 16 [IU] via SUBCUTANEOUS
  Filled 2015-04-10 (×5): qty 0.16

## 2015-04-10 MED ORDER — CETYLPYRIDINIUM CHLORIDE 0.05 % MT LIQD
7.0000 mL | Freq: Two times a day (BID) | OROMUCOSAL | Status: DC
  Administered 2015-04-10 – 2015-04-15 (×5): 7 mL via OROMUCOSAL
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
## 2015-04-10 NOTE — Consult Note (Signed)
[facility]    Date of Admission:  04/08/2015           Day 2 vancomycin       Reason for Consult: Automatic consultation for staph aureus bacteremia    Primary Care Physician: Dr. Kathryne Eriksson  Principal Problem:   Staphylococcus aureus bacteremia Active Problems:   Diabetic ketoacidosis without coma associated with type 2 diabetes mellitus   Acute pain of right thigh   Hypertension   Hyperlipidemia   Alcohol abuse   Tobacco abuse disorder   Acute encephalopathy   Leg weakness   Atherosclerotic peripheral vascular disease   . antiseptic oral rinse  7 mL Mouth Rinse q12n4p  . aspirin EC  81 mg Oral Daily  . chlorhexidine  15 mL Mouth Rinse BID  . enoxaparin (LOVENOX) injection  40 mg Subcutaneous Daily  . famotidine (PEPCID) IV  20 mg Intravenous Q12H  . insulin aspart  0-5 Units Subcutaneous QHS  . insulin aspart  0-9 Units Subcutaneous Q4H  . insulin glargine  16 Units Subcutaneous QHS  . ipratropium-albuterol  3 mL Nebulization BID  . LORazepam  0-4 mg Intravenous Q6H   Followed by  . [START ON 04/11/2015] LORazepam  0-4 mg Intravenous Q12H  . metoprolol  5 mg Intravenous 4 times per day  . sodium bicarbonate  650 mg Oral BID  . thiamine IV  100 mg Intravenous Daily  . vancomycin  1,000 mg Intravenous Q12H    Recommendations: 1. Continue vancomycin pending final antibiotics susceptibility results 2. Repeat blood cultures 3. Transthoracic echocardiogram 4. Consider imaging of right thigh   Assessment: Andrew [name] has staph aureus bacteremia in the setting of poorly controlled diabetes and heavy alcohol use. I do not see a definite focus for infection but the acute right thigh pain may need further investigation. He has a history of penicillin allergy and cannot tell me if he has ever safely taken cephalo-spore ends. I will continue vancomycin alone pending antibiotics susceptibility results. I've ordered repeat blood cultures  and transthoracic echocardiogram.    HPI: Andrew [name] is a 68 y.o. male with diabetes, hypertension, dyslipidemia and heavy alcohol use who was brought to the hospital 2 days ago by his neighbor who had become aware of poor blood sugar control. He was febrile on admission and both admission blood cultures are growing staph aureus. He was encephalopathic upon admission but is now a little more alert and able to answer some questions. He states that he has pain in both legs but it is much worse in his right thigh. He states that the pain has been there for the last 4 days. He denies any trauma.   Review of Systems: Review of systems not obtained due to patient factors.  Past Medical History  Diagnosis Date  . Diabetes mellitus without complication   . Hypertension   . Hyperlipemia   . Blind right eye     Social History  Substance Use Topics  . Smoking status: Current Every Day Smoker -- 1.50 packs/day    Types: Cigarettes  . Smokeless tobacco: None  . Alcohol Use: 16.8 oz/week    28 Cans of beer per week    Family History  Problem Relation Age of Onset  . Diabetes Mellitus II Mother   . Diabetes Mellitus II Father   . Diabetes Mellitus II Sister   . Diabetes Mellitus II Sister    Allergies  Allergen Reactions  . Penicillins Rash and Other (See Comments)    Possibly rash?    OBJECTIVE: Blood pressure 177/69, pulse 78, temperature 98.5 F (36.9 C), temperature source Oral, resp. rate 18, weight 184 lb 11.9 oz (83.8 kg), SpO2 95 %. General: He is asleep but arouses slowly to voice and is able to answer simple questions. He is disheveled and unshaven Skin: Abrasions on his right elbow. No splinter or conjunctival hemorrhages noted. 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 Pain with palpation in right thigh. No cellulitis or fluctuance noted. No foot ulcers.  Lab Results Lab Results  Component Value Date   WBC 12.7* 04/10/2015   HGB 14.9 04/10/2015   HCT 42.3 04/10/2015   MCV 88.5 04/10/2015   PLT 191 04/10/2015    Lab Results  Component Value Date   CREATININE 1.03 04/10/2015   BUN 13 04/10/2015   NA 133* 04/10/2015   K 3.6 04/10/2015   CL 100* 04/10/2015   CO2 17* 04/10/2015    Lab Results  Component Value Date   ALT 13* 04/09/2015   AST 25 04/09/2015   ALKPHOS 72 04/09/2015   BILITOT 1.0 04/09/2015     Microbiology: Recent Results (from the past 240 hour(s))  Culture, blood (routine x 2)     Status: None (Preliminary result)   Collection Time: 04/08/15  8:15 PM  Result Value Ref Range Status   Specimen Description BLOOD RIGHT ARM  Final   Special Requests BOTTLES DRAWN AEROBIC AND ANAEROBIC 10ML  Final   Culture  Setup Time   Final    GRAM POSITIVE COCCI IN CLUSTERS IN BOTH AEROBIC AND ANAEROBIC BOTTLES CRITICAL RESULT CALLED TO, READ BACK BY AND VERIFIED WITH: J. BOZEMAN,RN AT [id number] ON [id number] BY Rhea Bleacher    Culture STAPHYLOCOCCUS AUREUS  Final   Report Status PENDING  Incomplete  Culture, blood (routine x 2)     Status: None (Preliminary result)   Collection Time: 04/08/15  8:25 PM  Result Value Ref Range Status   Specimen Description BLOOD RIGHT FOREARM  Final   Special Requests BOTTLES DRAWN AEROBIC AND ANAEROBIC 10ML  Final   Culture  Setup Time   Final    GRAM POSITIVE COCCI IN CLUSTERS IN BOTH AEROBIC AND ANAEROBIC BOTTLES CRITICAL RESULT CALLED TO, READ BACK BY AND VERIFIED WITH: J [facility] 04/09/15 @ Gustav[place] M VESTAL    Culture STAPHYLOCOCCUS AUREUS  Final   Report Status PENDING  Incomplete  Urine culture     Status: None   Collection Time: 04/08/15  9:20 PM  Result Value Ref Range Status   Specimen Description URINE, CLEAN CATCH  Final   Special Requests NONE  Final     Culture MULTIPLE SPECIES PRESENT, SUGGEST RECOLLECTION  Final   Report Status 04/10/2015 FINAL  Final    Michel Bickers, MD [facility] [phone] pager   [phone] cell 04/10/2015, 12:11 PM

## 2015-04-11 DIAGNOSIS — E785 Hyperlipidemia, unspecified: Secondary | ICD-10-CM

## 2015-04-11 DIAGNOSIS — B9562 Methicillin resistant Staphylococcus aureus infection as the cause of diseases classified elsewhere: Secondary | ICD-10-CM

## 2015-04-11 DIAGNOSIS — A419 Sepsis, unspecified organism: Secondary | ICD-10-CM

## 2015-04-11 DIAGNOSIS — I10 Essential (primary) hypertension: Secondary | ICD-10-CM

## 2015-04-11 LAB — GLUCOSE, CAPILLARY
GLUCOSE-CAPILLARY: 99 mg/dL (ref 65–99)
GLUCOSE-CAPILLARY: 126 mg/dL — AB (ref 65–99)
GLUCOSE-CAPILLARY: 120 mg/dL — AB (ref 65–99)
Glucose-Capillary: 160 mg/dL — ABNORMAL HIGH (ref 65–99)
Glucose-Capillary: 132 mg/dL — ABNORMAL HIGH (ref 65–99)

## 2015-04-11 LAB — CBC
HEMATOCRIT: 40.3 % (ref 39.0–52.0)
HEMOGLOBIN: 14.2 g/dL (ref 13.0–17.0)
MCH: 30.7 pg (ref 26.0–34.0)
MCHC: 35.2 g/dL (ref 30.0–36.0)
MCV: 87 fL (ref 78.0–100.0)
Platelets: 165 10*3/uL (ref 150–400)
RBC: 4.63 MIL/uL (ref 4.22–5.81)
RDW: 13.3 % (ref 11.5–15.5)
WBC: 11.6 10*3/uL — ABNORMAL HIGH (ref 4.0–10.5)

## 2015-04-11 LAB — CULTURE, BLOOD (ROUTINE X 2)

## 2015-04-11 LAB — COMPREHENSIVE METABOLIC PANEL
ALBUMIN: 2.2 g/dL — AB (ref 3.5–5.0)
ALK PHOS: 94 U/L (ref 38–126)
ALT: 24 U/L (ref 17–63)
ANION GAP: 9 (ref 5–15)
AST: 41 U/L (ref 15–41)
BILIRUBIN TOTAL: 0.7 mg/dL (ref 0.3–1.2)
BUN: 7 mg/dL (ref 6–20)
CALCIUM: 8.3 mg/dL — AB (ref 8.9–10.3)
CO2: 24 mmol/L (ref 22–32)
CREATININE: 0.52 mg/dL — AB (ref 0.61–1.24)
Chloride: 102 mmol/L (ref 101–111)
GFR calc Af Amer: >60 mL/min (ref >60)
GFR calc non Af Amer: >60 mL/min (ref >60)
GLUCOSE: 142 mg/dL — AB (ref 65–99)
Potassium: 3.1 mmol/L — ABNORMAL LOW (ref 3.5–5.1)
Sodium: 135 mmol/L (ref 135–145)
TOTAL PROTEIN: 5.1 g/dL — AB (ref 6.5–8.1)

## 2015-04-11 MED ORDER — POTASSIUM CHLORIDE CRYS ER 20 MEQ PO TBCR
40.0000 meq | EXTENDED_RELEASE_TABLET | ORAL | Status: AC
  Administered 2015-04-11: 40 meq via ORAL
  Filled 2015-04-11: qty 2

## 2015-04-11 MED ORDER — SODIUM CHLORIDE 0.9 % IV SOLN
INTRAVENOUS | Status: DC
  Administered 2015-04-11 – 2015-04-12 (×3): via INTRAVENOUS
  Filled 2015-04-11 (×6): qty 1000

## 2015-04-11 NOTE — Evaluation (Signed)
Physical Therapy Evaluation Patient Details Name: Andrew [name] MRN: [id number] DOB: [birth year] Today's Date: 04/11/2015   History of Present Illness  69 year old male with diabetes mellitus, hypertension, hyperlipidemia presents with 3 day history of generalized weakness, bilateral lower extremity pain and weakness, lethargy and somnolence, and polyuria. Found to have staph aureus bacteremia, diabetic ketoacidosis/Diabetes mellitus type II uncontrolled, RLE pain/weakness, and acute encephalopathy.  Clinical Impression  Pt admitted with the above complications. Pt currently with functional limitations due to the deficits listed below (see PT Problem List). Limited evaluation due to significant lethargy. Required max assist to stand, unsafe to transfer at this time. MD reports pt will not be scheduled routine ativan tomorrow which may provide a better picture of Andrew [name] current functional level. Pt will benefit from skilled PT to increase their independence and safety with mobility to allow discharge to the venue listed below.       Follow Up Recommendations Other (comment) (To be determined based on progression.) Hopeful to obtain a better assessment of patient's current functional status tomorrow if he is less lethargic. However, we may need to consider higher level of care (SNF) if patient is still having great difficulty with mobility.     Equipment Recommendations   (TBD based on progression)    Recommendations for Other Services OT consult     Precautions / Restrictions Precautions Precautions: Fall Restrictions Weight Bearing Restrictions: No      Mobility  Bed Mobility Overal bed mobility: Needs Assistance Bed Mobility: Rolling;Sidelying to Sit;Sit to Sidelying Rolling: Mod assist Sidelying to sit: Mod assist;HOB elevated     Sit to sidelying: Mod assist General bed mobility comments: Mod assist to bring LEs off of bed and for truncal support. Max VC for technique  and initiation.  Transfers Overall transfer level: Needs assistance Equipment used: Rolling walker (2 wheeled) Transfers: Sit to/from Stand Sit to Stand: Max assist         General transfer comment: Pt attempted on own and could not initiate stand from bed. Max assist for boost and cues for hand placement and upright posture to rise, but did not stand fully upright before LEs buckle and pt sits back onto bed. Poor grip with hands BIL. Poor initiation and not alert enough to attempt pivot transfer safely.  Ambulation/Gait                Stairs            Wheelchair Mobility    Modified Rankin (Stroke Patients Only)       Balance Overall balance assessment: Needs assistance Sitting-balance support: No upper extremity supported;Feet supported Sitting balance-Leahy Scale: Fair     Standing balance support: Bilateral upper extremity supported Standing balance-Leahy Scale: Zero                               Pertinent Vitals/Pain Pain Assessment: 0-10 Pain Score:  ("off and on" no value or location given) Pain Intervention(s): Monitored during session;Repositioned    Home Living Family/patient expects to be discharged to:: Private residence Living Arrangements: Children (Lives with son, questionable ability to care for pt ) Available Help at Discharge: Family Type of Home: Mobile home Home Access: Stairs to enter Entrance Stairs-Rails: Psychiatric nurse of Steps: 5 Home Layout: One level Home Equipment: Cane - single point Additional Comments: Pt is a poor historian, most information provided from friend in room.    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 Pt states he sometimes uses cane to ambulate.     Hand Dominance        Extremity/Trunk  Assessment   Upper Extremity Assessment: Defer to OT evaluation           Lower Extremity Assessment: Difficult to assess due to impaired cognition         Communication   Communication: Other (comment) (low tone- low level of arousal)  Cognition Arousal/Alertness: Lethargic;Suspect due to medications Behavior During Therapy: Flat affect Overall Cognitive Status: No family/caregiver present to determine baseline cognitive functioning Area of Impairment: Orientation Orientation Level: Disoriented to;Place;Time;Situation                  General Comments General comments (skin integrity, edema, etc.): Very lethargic, requires tactile and verbal cues to facilitate desired tasks    Exercises        Assessment/Plan    PT Assessment Patient needs continued PT services  PT Diagnosis Difficulty walking;Generalized weakness;Acute pain;Altered mental status   PT Problem List Decreased strength;Decreased activity tolerance;Decreased balance;Decreased mobility;Decreased knowledge of use of DME;Decreased cognition;Decreased safety awareness;Pain  PT Treatment Interventions DME instruction;Gait training;Stair training;Functional mobility training;Therapeutic activities;Therapeutic exercise;Balance training;Cognitive remediation;Patient/family education   PT Goals (Current goals can be found in the Care Plan section) Acute Rehab PT Goals Patient Stated Goal: None stated PT Goal Formulation: Patient unable to participate in goal setting Time For Goal Achievement: 04/25/15 Potential to Achieve Goals: Good    Frequency Min 3X/week   Barriers to discharge Decreased caregiver support questionable assist from family at home.    Co-evaluation               End of Session Equipment Utilized During Treatment: Gait belt Activity Tolerance: Patient limited by lethargy Patient left: in bed;with call bell/phone within reach;with bed alarm set;with nursing/sitter in room Nurse  Communication: Mobility status         Time: 5631-4970 PT Time Calculation (min) (ACUTE ONLY): 18 min   Charges:   PT Evaluation $Initial PT Evaluation Tier I: 1 Procedure     PT G CodesEllouise Newer 04/11/2015, 3:52 PM Camille Bal [place], [place]

## 2015-04-11 NOTE — Care Management Important Message (Signed)
Important Message  Patient Details  Name: Andrew [name] MRN: [id number] Date of Birth: Jul 15, 1946   Medicare Important Message Given:  Yes-second notification given    Delorse Lek 04/11/2015, 12:07 PM
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
## 2015-04-11 NOTE — Progress Notes (Signed)
Patient ID: Andrew [name], male   DOB: 24-Apr-1946, 69 y.o.   MRN: [id number]         [facility]    Date of Admission:  04/08/2015   Total days of antibiotics 3          Principal Problem:   Bacteremia due to methicillin resistant Staphylococcus aureus Active Problems:   Diabetic ketoacidosis without coma associated with type 2 diabetes mellitus   Acute pain of right thigh   Hypertension   Hyperlipidemia   Alcohol abuse   Tobacco abuse disorder   Acute encephalopathy   Leg weakness   Atherosclerotic peripheral vascular disease   Right thigh pain   . antiseptic oral rinse  7 mL Mouth Rinse q12n4p  . aspirin EC  81 mg Oral Daily  . chlorhexidine  15 mL Mouth Rinse BID  . enoxaparin (LOVENOX) injection  40 mg Subcutaneous Daily  . famotidine (PEPCID) IV  20 mg Intravenous Q12H  . insulin aspart  0-5 Units Subcutaneous QHS  . insulin aspart  0-9 Units Subcutaneous Q4H  . insulin glargine  16 Units Subcutaneous QHS  . ipratropium-albuterol  3 mL Nebulization BID  . LORazepam  0-4 mg Intravenous Q12H  . metoprolol  5 mg Intravenous 4 times per day  . potassium chloride  40 mEq Oral Q4H  . thiamine IV  100 mg Intravenous Daily  . vancomycin  1,000 mg Intravenous Q12H   Review of Systems: Review of systems not obtained due to patient factors.  Past Medical History  Diagnosis Date  . Diabetes mellitus without complication   . Hypertension   . Hyperlipemia   . Blind right eye     Social History  Substance Use Topics  . Smoking status: Current Every Day Smoker -- 1.50 packs/day    Types: Cigarettes  . Smokeless tobacco: None  . Alcohol Use: 16.8 oz/week    28 Cans of beer per week    Family History  Problem Relation Age of Onset  . Diabetes Mellitus II Mother   . Diabetes Mellitus II Father   . Diabetes Mellitus II Sister   . Diabetes Mellitus II Sister    Allergies  Allergen Reactions  . Penicillins Rash and Other (See Comments)      Possibly rash?    OBJECTIVE: Filed Vitals:   04/10/15 2108 04/11/15 0137 04/11/15 0350 04/11/15 1312  BP:  117/49 130/79 163/57  Pulse:  71 71 81  Temp:  97.9 F (36.6 C) 98 F (36.7 C) 98.4 F (36.9 C)  TempSrc:  Oral Oral Oral  Resp:  19 19 22   Weight:      SpO2: 96% 97% 96% 92%   There is no height on file to calculate BMI.  General: somnolent. He does not arouse to voice (he has received Ativan) Skin: no splinter or conjunctival hemorrhages. No rash Lungs: clear Cor: regular S1 and S2 no murmurs Right thigh: No redness, swelling or fluctuance. Palpation does not seem to elicit pain  Lab Results Lab Results  Component Value Date   WBC 11.6* 04/11/2015   HGB 14.2 04/11/2015   HCT 40.3 04/11/2015   MCV 87.0 04/11/2015   PLT 165 04/11/2015    Lab Results  Component Value Date   CREATININE 0.52* 04/11/2015   BUN 7 04/11/2015   NA 135 04/11/2015   K 3.1* 04/11/2015   CL 102 04/11/2015   CO2 24 04/11/2015    Lab Results  Component Value Date  ALT 24 04/11/2015   AST 41 04/11/2015   ALKPHOS 94 04/11/2015   BILITOT 0.7 04/11/2015     Microbiology: Recent Results (from the past 240 hour(s))  Culture, blood (routine x 2)     Status: None   Collection Time: 04/08/15  8:15 PM  Result Value Ref Range Status   Specimen Description BLOOD RIGHT ARM  Final   Special Requests BOTTLES DRAWN AEROBIC AND ANAEROBIC 10ML  Final   Culture  Setup Time   Final    GRAM POSITIVE COCCI IN CLUSTERS IN BOTH AEROBIC AND ANAEROBIC BOTTLES CRITICAL RESULT CALLED TO, READ BACK BY AND VERIFIED WITH: J. BOZEMAN,RN AT 0848 ON [id number] BY Rhea Bleacher    Culture METHICILLIN RESISTANT STAPHYLOCOCCUS AUREUS  Final   Report Status 04/11/2015 FINAL  Final   Organism ID, Bacteria METHICILLIN RESISTANT STAPHYLOCOCCUS AUREUS  Final      Susceptibility   Methicillin resistant staphylococcus aureus - MIC*    CIPROFLOXACIN <=0.5 SENSITIVE Sensitive     ERYTHROMYCIN >=8 RESISTANT Resistant      GENTAMICIN <=0.5 SENSITIVE Sensitive     OXACILLIN >=4 RESISTANT Resistant     TETRACYCLINE <=1 SENSITIVE Sensitive     VANCOMYCIN 1 SENSITIVE Sensitive     TRIMETH/SULFA <=10 SENSITIVE Sensitive     CLINDAMYCIN <=0.25 SENSITIVE Sensitive     RIFAMPIN <=0.5 SENSITIVE Sensitive     Inducible Clindamycin NEGATIVE Sensitive     * METHICILLIN RESISTANT STAPHYLOCOCCUS AUREUS  Culture, blood (routine x 2)     Status: None   Collection Time: 04/08/15  8:25 PM  Result Value Ref Range Status   Specimen Description BLOOD RIGHT FOREARM  Final   Special Requests BOTTLES DRAWN AEROBIC AND ANAEROBIC 10ML  Final   Culture  Setup Time   Final    GRAM POSITIVE COCCI IN CLUSTERS IN BOTH AEROBIC AND ANAEROBIC BOTTLES CRITICAL RESULT CALLED TO, READ BACK BY AND VERIFIED WITH: J BOZEMAN 04/09/15 @ 0949 M VESTAL    Culture   Final    STAPHYLOCOCCUS AUREUS SUSCEPTIBILITIES PERFORMED ON PREVIOUS CULTURE WITHIN THE LAST 5 DAYS.    Report Status 04/11/2015 FINAL  Final  Urine culture     Status: None   Collection Time: 04/08/15  9:20 PM  Result Value Ref Range Status   Specimen Description URINE, CLEAN CATCH  Final   Special Requests NONE  Final   Culture MULTIPLE SPECIES PRESENT, SUGGEST RECOLLECTION  Final   Report Status 04/10/2015 FINAL  Final  Culture, blood (routine x 2)     Status: None (Preliminary result)   Collection Time: 04/10/15  1:20 PM  Result Value Ref Range Status   Specimen Description BLOOD RIGHT ARM  Final   Special Requests BOTTLES DRAWN AEROBIC ONLY 5CC  Final   Culture NO GROWTH < 24 HOURS  Final   Report Status PENDING  Incomplete  Culture, blood (routine x 2)     Status: None (Preliminary result)   Collection Time: 04/10/15  1:30 PM  Result Value Ref Range Status   Specimen Description BLOOD RIGHT HAND  Final   Special Requests IN PEDIATRIC BOTTLE 3CC  Final   Culture NO GROWTH < 24 HOURS  Final   Report Status PENDING  Incomplete   CT OF THE RIGHT HIP WITH CONTRAST;  CT OF THE RIGHT LOWER EXTREMITY WITH CONTRAST 04/10/2015  TECHNIQUE: Multidetector CT imaging was performed following the standard protocol during bolus administration of intravenous contrast.  CONTRAST: 154mL OMNIPAQUE IOHEXOL 300 MG/ML SOLN  COMPARISON: None.  FINDINGS: There is no acute fracture or dislocation. There is no lytic or sclerotic osseous lesion. There is mild generalized osteopenia. The superior and inferior right pubic rami are intact. There is no bone destruction.  The muscles enhance normally and homogeneously. There is a small amount of fluid deep to the extensor compartment thigh muscles. There is no other fluid collection or hematoma. There is peripheral vascular atherosclerotic disease. There is no soft tissue emphysema. There is no radiopaque foreign body.  IMPRESSION: 1. Nonspecific fasciitis involving the extensor compartment of the right thigh which may be secondary to an infectious or inflammatory etiology. There is no drainable fluid collection.   Electronically Signed  By: Kathreen Devoid  On: 04/10/2015 20:07  ASSESSMENT: He has MRSA bacteremia and may have some very early soft tissue infection in his right thigh. I doubt that he needs surgery. I will continue vancomycin pending repeat blood cultures. Transthoracic echocardiogram does not reveal any evidence of endocarditis. We will need to consider TEE once he is more alert.  PLAN: 1. Continue vancomycin 2. Monitor repeat blood cultures 3. TEE soon  Michel Bickers, [place] [facility] [place] [phone] pager   [phone] cell 04/11/2015, 2:40 PM

## 2015-04-11 NOTE — Evaluation (Signed)
Clinical/Bedside Swallow Evaluation Patient Details  Name: Andrew [name] MRN: [id number] Date of Birth: 1945/10/23  Today's Date: 04/11/2015 Time: SLP Start Time (ACUTE ONLY): 70 SLP Stop Time (ACUTE ONLY): 1605 SLP Time Calculation (min) (ACUTE ONLY): 15 min  Past Medical History:  Past Medical History  Diagnosis Date  . Diabetes mellitus without complication   . Hypertension   . Hyperlipemia   . Blind right eye    Past Surgical History: History reviewed. No pertinent past surgical history. HPI:  69 year old male with diabetes mellitus, hypertension, hyperlipidemia presents with 3 day history of generalized weakness, bilateral lower extremity pain and weakness, lethargy and somnolence, and polyuria. Found to have staph aureus bacteremia, diabetic ketoacidosis/Diabetes mellitus type II uncontrolled, RLE pain/weakness, and acute encephalopathy.   Assessment / Plan / Recommendation Clinical Impression  Bedside swallow evaluation completed.  Patient's lethargy appears to be cause of his acute reversible dysphagia.  Patient required Max multimodal cues for arousal to consume Dys.2 textures and thin liquids with effective oral clearance and no overt s/s of aspiration.  Patient declined further trials due to report of being too sleepy.  Patient will benefit from acute follow up for diet check and potential upgrade, but will likely not require SLP follow up at discharge.     Aspiration Risk  Mild    Diet Recommendation Dysphagia 2 (Fine chop);Thin   Medication Administration: Whole meds with puree Compensations: Slow rate;Small sips/bites    Other  Recommendations Oral Care Recommendations: Oral care BID   Follow Up Recommendations  None   Frequency and Duration min 2x/week  1 week   Pertinent Vitals/Pain None   SLP Swallow Goals  see care plan    Swallow Study Prior Functional Status  Type of Home: Mobile home Available Help at Discharge: Family    General Other  Pertinent Information: 69 year old male with diabetes mellitus, hypertension, hyperlipidemia presents with 3 day history of generalized weakness, bilateral lower extremity pain and weakness, lethargy and somnolence, and polyuria. Found to have staph aureus bacteremia, diabetic ketoacidosis/Diabetes mellitus type II uncontrolled, RLE pain/weakness, and acute encephalopathy. Type of Study: Bedside swallow evaluation Previous Swallow Assessment: none on record Diet Prior to this Study: Thin liquids Temperature Spikes Noted: No Respiratory Status: Room air History of Recent Intubation: No Behavior/Cognition: Lethargic/Drowsy;Requires cueing Oral Cavity - Dentition: Missing dentition Self-Feeding Abilities: Able to feed self;Needs assist;Needs set up Patient Positioning: Upright in bed Baseline Vocal Quality: Normal Volitional Cough: Weak Volitional Swallow: Able to elicit    Oral/Motor/Sensory Function Overall Oral Motor/Sensory Function: Appears within functional limits for tasks assessed (some generalized weakness)   Ice Chips Ice chips: Within functional limits Presentation: Spoon   Thin Liquid Thin Liquid: Within functional limits Presentation: Cup;Straw Pharyngeal  Phase Impairments: Multiple swallows Other Comments: however large sips were consumed     Nectar Thick Nectar Thick Liquid: Not tested   Honey Thick Honey Thick Liquid: Not tested   Puree Puree: Within functional limits   Solid   GO    Other Comments: refused regular textures, but safest consumed Dys.2 textures      Gunnar Fusi, M.A., CCC-SLP [phone]  Charlcie Prisco 04/11/2015,4:28 PM

## 2015-04-12 ENCOUNTER — Encounter (HOSPITAL_COMMUNITY): Payer: Self-pay | Admitting: Internal Medicine

## 2015-04-12 DIAGNOSIS — E876 Hypokalemia: Secondary | ICD-10-CM

## 2015-04-12 DIAGNOSIS — R400 Somnolence: Secondary | ICD-10-CM

## 2015-04-12 DIAGNOSIS — I671 Cerebral aneurysm, nonruptured: Secondary | ICD-10-CM | POA: Diagnosis present

## 2015-04-12 HISTORY — DX: Cerebral aneurysm, nonruptured: I67.1

## 2015-04-12 LAB — BASIC METABOLIC PANEL
Anion gap: 11 (ref 5–15)
CALCIUM: 8.3 mg/dL — AB (ref 8.9–10.3)
CO2: 24 mmol/L (ref 22–32)
CREATININE: 0.5 mg/dL — AB (ref 0.61–1.24)
Chloride: 100 mmol/L — ABNORMAL LOW (ref 101–111)
GFR calc Af Amer: >60 mL/min (ref >60)
GFR calc non Af Amer: >60 mL/min (ref >60)
GLUCOSE: 142 mg/dL — AB (ref 65–99)
Potassium: 3.1 mmol/L — ABNORMAL LOW (ref 3.5–5.1)
Sodium: 135 mmol/L (ref 135–145)

## 2015-04-12 LAB — VANCOMYCIN, TROUGH: VANCOMYCIN TR: 8 ug/mL — AB (ref 10.0–20.0)

## 2015-04-12 LAB — GLUCOSE, CAPILLARY
GLUCOSE-CAPILLARY: 161 mg/dL — AB (ref 65–99)
GLUCOSE-CAPILLARY: 162 mg/dL — AB (ref 65–99)
Glucose-Capillary: 132 mg/dL — ABNORMAL HIGH (ref 65–99)
Glucose-Capillary: 211 mg/dL — ABNORMAL HIGH (ref 65–99)
Glucose-Capillary: 342 mg/dL — ABNORMAL HIGH (ref 65–99)
Glucose-Capillary: 274 mg/dL — ABNORMAL HIGH (ref 65–99)
Glucose-Capillary: 249 mg/dL — ABNORMAL HIGH (ref 65–99)

## 2015-04-12 LAB — CBC
HCT: 41.2 % (ref 39.0–52.0)
HEMOGLOBIN: 14.9 g/dL (ref 13.0–17.0)
MCH: 31.3 pg (ref 26.0–34.0)
MCHC: 36.2 g/dL — AB (ref 30.0–36.0)
MCV: 86.6 fL (ref 78.0–100.0)
Platelets: 208 10*3/uL (ref 150–400)
RBC: 4.76 MIL/uL (ref 4.22–5.81)
RDW: 13.1 % (ref 11.5–15.5)
WBC: 11.8 10*3/uL — ABNORMAL HIGH (ref 4.0–10.5)

## 2015-04-12 LAB — MAGNESIUM: Magnesium: 1.2 mg/dL — ABNORMAL LOW (ref 1.7–2.4)

## 2015-04-12 MED ORDER — KETOROLAC TROMETHAMINE 30 MG/ML IJ SOLN
30.0000 mg | Freq: Four times a day (QID) | INTRAMUSCULAR | Status: DC | PRN
  Administered 2015-04-12: 30 mg via INTRAVENOUS
  Filled 2015-04-12: qty 1

## 2015-04-12 MED ORDER — VANCOMYCIN HCL 10 G IV SOLR
1500.0000 mg | Freq: Two times a day (BID) | INTRAVENOUS | Status: DC
  Administered 2015-04-12 – 2015-04-13 (×4): 1500 mg via INTRAVENOUS
  Filled 2015-04-12 (×5): qty 1500

## 2015-04-12 MED ORDER — VITAMIN B-1 100 MG PO TABS
100.0000 mg | ORAL_TABLET | Freq: Every day | ORAL | Status: DC
  Administered 2015-04-13 – 2015-04-15 (×3): 100 mg via ORAL
  Filled 2015-04-12 (×3): qty 1

## 2015-04-12 MED ORDER — POTASSIUM CHLORIDE CRYS ER 20 MEQ PO TBCR
40.0000 meq | EXTENDED_RELEASE_TABLET | ORAL | Status: AC
  Administered 2015-04-12 (×2): 40 meq via ORAL
  Filled 2015-04-12 (×2): qty 2

## 2015-04-12 MED ORDER — METOPROLOL TARTRATE 1 MG/ML IV SOLN
7.5000 mg | Freq: Four times a day (QID) | INTRAVENOUS | Status: DC
  Administered 2015-04-12 – 2015-04-13 (×5): 7.5 mg via INTRAVENOUS
  Filled 2015-04-12 (×5): qty 10

## 2015-04-12 MED ORDER — MAGNESIUM SULFATE 4 GM/100ML IV SOLN
4.0000 g | Freq: Once | INTRAVENOUS | Status: AC
  Administered 2015-04-12: 4 g via INTRAVENOUS
  Filled 2015-04-12: qty 100

## 2015-04-12 MED ORDER — TRAMADOL HCL 50 MG PO TABS
50.0000 mg | ORAL_TABLET | Freq: Four times a day (QID) | ORAL | Status: DC | PRN
  Administered 2015-04-15: 50 mg via ORAL
  Filled 2015-04-12: qty 1

## 2015-04-12 MED ORDER — FAMOTIDINE 20 MG PO TABS
20.0000 mg | ORAL_TABLET | Freq: Two times a day (BID) | ORAL | Status: DC
  Administered 2015-04-12 – 2015-04-15 (×6): 20 mg via ORAL
  Filled 2015-04-12 (×6): qty 1

## 2015-04-12 NOTE — Evaluation (Signed)
Occupational Therapy Evaluation Patient Details Name: Andrew [name] MRN: [id number] DOB: [birth year] Today's Date: 04/12/2015    History of Present Illness 69 year old male with diabetes mellitus, hypertension, hyperlipidemia presents with 3 day history of generalized weakness, bilateral lower extremity pain and weakness, lethargy and somnolence, and polyuria. Found to have staph aureus bacteremia, diabetic ketoacidosis/Diabetes mellitus type II uncontrolled, RLE pain/weakness, and acute encephalopathy.   Clinical Impression   PT admitted with staph aureus bacteremia and plyura. Pt currently with functional limitiations due to the deficits listed below (see OT problem list). PTA from home  Pt will benefit from skilled OT to increase their independence and safety with adls and balance to allow discharge SNF. Pt currently with decr sitting balance, standing tolerance and cogntiive deficits     Follow Up Recommendations  SNF    Equipment Recommendations  Other (comment) (defer)    Recommendations for Other Services       Precautions / Restrictions Precautions Precautions: Fall      Mobility Bed Mobility Overal bed mobility: Needs Assistance Bed Mobility: Supine to Sit Rolling: Min assist         General bed mobility comments: pt heavy use of bed rail and hob elevated slightly. Pt able to progress bil LE off eob  Transfers Overall transfer level: Needs assistance Equipment used: Rolling walker (2 wheeled) Transfers: Sit to/from Stand Sit to Stand: +2 physical assistance;Min assist Stand pivot transfers: Min assist       General transfer comment: cues for safety. pt motivated to get in chair to eat and talking about desire to return to smoking. Pt with noticeable stains on bil UE from cigarettes    Balance Overall balance assessment: Needs assistance Sitting-balance support: Bilateral upper extremity supported;Feet supported Sitting balance-Leahy Scale: Good                                       ADL Overall ADL's : Needs assistance/impaired Eating/Feeding: Set up;Sitting Eating/Feeding Details (indicate cue type and reason): cues for swallowing precautions, pocketing minimal and very rapid intake of food in mouth. cues to drink between bites. Pt states "69 yo old and I can't even eat by myself" Grooming: Wash/dry hands;Set up;Sitting   Upper Body Bathing: Moderate assistance;Bed level   Lower Body Bathing: Total assistance;Sit to/from stand                         General ADL Comments: Pt agreeable reluctantly to complete OOB to chair to eat lunch at 1300PM. pt declined PO intake previous with RN assistance. 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    Lower Extremity Assessment Lower Extremity Assessment: Defer to PT evaluation   Cervical / Trunk Assessment Cervical / Trunk Assessment: Kyphotic   Communication Communication Communication: No difficulties   Cognition Arousal/Alertness: Awake/alert Behavior During Therapy: Flat affect Overall Cognitive Status: No family/caregiver present to determine baseline cognitive functioning  General Comments       Exercises       Shoulder Instructions      Home Living Family/patient expects to be discharged to:: Private residence Living Arrangements: Children Available Help at Discharge: Family Type of Home: Mobile home Home Access: Stairs to enter Technical brewer of Steps: 5 Entrance Stairs-Rails: Right;Left Home Layout: One level     Bathroom Shower/Tub:  Teacher, early years/pre: [place]: Cane - single point          Prior Functioning/Environment Level of Independence: Independent with assistive device(s)             OT Diagnosis: Generalized weakness;Cognitive deficits   OT Problem List: Decreased strength;Decreased activity tolerance;Impaired balance (sitting and/or standing);Decreased safety awareness;Decreased cognition;Decreased knowledge of use of DME or AE;Decreased knowledge of precautions   OT Treatment/Interventions: Self-care/ADL training;Therapeutic exercise;DME and/or AE instruction;Therapeutic activities;Cognitive remediation/compensation;Patient/family education;Balance training    OT Goals(Current goals can be found in the care plan section) Acute Rehab OT Goals Patient Stated Goal: to smoke OT Goal Formulation: Patient unable to participate in goal setting Time For Goal Achievement: 04/26/15 Potential to Achieve Goals: Good  OT Frequency: Min 2X/week   Barriers to D/C:            Co-evaluation              End of Session Equipment Utilized During Treatment: Gait belt;Rolling walker Nurse Communication: Mobility status;Precautions  Activity Tolerance: Patient tolerated treatment well Patient left: in chair;with call bell/phone within reach;with chair alarm set   Time: 7262-0355 OT Time Calculation (min): 28 min Charges:  OT General Charges $OT Visit: 1 Procedure OT Evaluation $Initial OT Evaluation Tier I: 1 Procedure OT Treatments $Self Care/Home Management : 8-22 mins G-Codes:    Peri Maris 27-Apr-2015, 3:05 PM   Jeri Modena   OTR/L Pager: [phone] Office: [phone] .

## 2015-04-12 NOTE — Progress Notes (Signed)
Speech Language Pathology Treatment: Dysphagia  Patient Details Name: Andrew [name] MRN: [id number] DOB: 1946/07/26 Today's Date: 04/12/2015 Time: 1601-0932 SLP Time Calculation (min) (ACUTE ONLY): 10 min  Assessment / Plan / Recommendation Clinical Impression  Pt more alert today and able to consume regular textures and thin liquids with Mod I and extra time. He does describe eating softer solids at home. Will advance diet to Dys 3 textures, continuing thin liquids. Will f/u an additional time while pt remains in house to ensure tolerance.   HPI Other Pertinent Information: 69 year old male with diabetes mellitus, hypertension, hyperlipidemia presents with 3 day history of generalized weakness, bilateral lower extremity pain and weakness, lethargy and somnolence, and polyuria. Found to have staph aureus bacteremia, diabetic ketoacidosis/Diabetes mellitus type II uncontrolled, RLE pain/weakness, and acute encephalopathy.   Pertinent Vitals Pain Assessment: No/denies pain  SLP Plan  Continue with current plan of care    Recommendations Diet recommendations: Dysphagia 3 (mechanical soft);Thin liquid Liquids provided via: Cup;Straw Medication Administration: Whole meds with puree Supervision: Patient able to self feed;Intermittent supervision to cue for compensatory strategies Compensations: Slow rate;Small sips/bites Postural Changes and/or Swallow Maneuvers: Seated upright 90 degrees       Oral Care Recommendations: Oral care BID Follow up Recommendations: None Plan: Continue with current plan of care     Germain Osgood, M.A. CCC-SLP [phone]  Germain Osgood 04/12/2015, 2:15 PM

## 2015-04-12 NOTE — Progress Notes (Signed)
ANTIBIOTIC CONSULT NOTE - Follow-up  Pharmacy Consult for vancomycin Indication: Bacteremia  Allergies  Allergen Reactions  . Penicillins Rash and Other (See Comments)    Possibly rash?    Patient Measurements: Weight: 184 lb 11.9 oz (83.8 kg)  Vital Signs: Temp: 98.6 F (37 C) (08/26 0500) Temp Source: Oral (08/26 0500) BP: 179/57 mmHg (08/26 0500) Pulse Rate: 75 (08/26 0500) Intake/Output from previous day: 08/25 0701 - 08/26 0700 In: 3264.6 [P.O.:300; I.V.:2464.6; IV Piggyback:500] Out: -  Intake/Output from this shift: Total I/O In: 240 [P.O.:240] Out: -   Labs:  Recent Labs  04/10/15 0324 04/11/15 0404 04/12/15 0341  WBC 12.7* 11.6* 11.8*  HGB 14.9 14.2 14.9  PLT 191 165 208  CREATININE 1.03 0.52* 0.50*   CrCl cannot be calculated (Unknown ideal weight.).  Recent Labs  04/12/15 0925  [place] 8*     Microbiology: Recent Results (from the past 720 hour(s))  Culture, blood (routine x 2)     Status: None   Collection Time: 04/08/15  8:15 PM  Result Value Ref Range Status   Specimen Description BLOOD RIGHT ARM  Final   Special Requests BOTTLES DRAWN AEROBIC AND ANAEROBIC 10ML  Final   Culture  Setup Time   Final    GRAM POSITIVE COCCI IN CLUSTERS IN BOTH AEROBIC AND ANAEROBIC BOTTLES CRITICAL RESULT CALLED TO, READ BACK BY AND VERIFIED WITH: J. BOZEMAN,RN AT 0848 ON [id number] BY Rhea Bleacher    Culture METHICILLIN RESISTANT STAPHYLOCOCCUS AUREUS  Final   Report Status 04/11/2015 FINAL  Final   Organism ID, Bacteria METHICILLIN RESISTANT STAPHYLOCOCCUS AUREUS  Final      Susceptibility   Methicillin resistant staphylococcus aureus - MIC*    CIPROFLOXACIN <=0.5 SENSITIVE Sensitive     ERYTHROMYCIN >=8 RESISTANT Resistant     GENTAMICIN <=0.5 SENSITIVE Sensitive     OXACILLIN >=4 RESISTANT Resistant     TETRACYCLINE <=1 SENSITIVE Sensitive     VANCOMYCIN 1 SENSITIVE Sensitive     TRIMETH/SULFA <=10 SENSITIVE Sensitive     CLINDAMYCIN <=0.25  SENSITIVE Sensitive     RIFAMPIN <=0.5 SENSITIVE Sensitive     Inducible Clindamycin NEGATIVE Sensitive     * METHICILLIN RESISTANT STAPHYLOCOCCUS AUREUS  Culture, blood (routine x 2)     Status: None   Collection Time: 04/08/15  8:25 PM  Result Value Ref Range Status   Specimen Description BLOOD RIGHT FOREARM  Final   Special Requests BOTTLES DRAWN AEROBIC AND ANAEROBIC 10ML  Final   Culture  Setup Time   Final    GRAM POSITIVE COCCI IN CLUSTERS IN BOTH AEROBIC AND ANAEROBIC BOTTLES CRITICAL RESULT CALLED TO, READ BACK BY AND VERIFIED WITH: J BOZEMAN 04/09/15 @ 0949 M VESTAL    Culture   Final    STAPHYLOCOCCUS AUREUS SUSCEPTIBILITIES PERFORMED ON PREVIOUS CULTURE WITHIN THE LAST 5 DAYS.    Report Status 04/11/2015 FINAL  Final  Urine culture     Status: None   Collection Time: 04/08/15  9:20 PM  Result Value Ref Range Status   Specimen Description URINE, CLEAN CATCH  Final   Special Requests NONE  Final   Culture MULTIPLE SPECIES PRESENT, SUGGEST RECOLLECTION  Final   Report Status 04/10/2015 FINAL  Final  Culture, blood (routine x 2)     Status: None (Preliminary result)   Collection Time: 04/10/15  1:20 PM  Result Value Ref Range Status   Specimen Description BLOOD RIGHT ARM  Final   Special Requests BOTTLES DRAWN AEROBIC ONLY  5CC  Final   Culture NO GROWTH < 24 HOURS  Final   Report Status PENDING  Incomplete  Culture, blood (routine x 2)     Status: None (Preliminary result)   Collection Time: 04/10/15  1:30 PM  Result Value Ref Range Status   Specimen Description BLOOD RIGHT HAND  Final   Special Requests IN PEDIATRIC BOTTLE 3CC  Final   Culture NO GROWTH < 24 HOURS  Final   Report Status PENDING  Incomplete   Assessment: 69 yo M presents on 8/22 with R leg pain and DM. Levaquin was started yesterday for acute bronchitis and low grade fever. Started on vancomycin for MRSA bacteremia. Pt is afebrile and WBC is 11.8. Scr is down to 0.5. A trough today is low at 8.    8/22 Blood cx > 2/2 MRSA 8/22 Urine cx > multiple species 8/24 Blood cx > NGTD  8/23 Vancomycin >> 8/23 Levaquin >> 8/24  8/26 VT = 8 on 1gm IV Q12H  Goal of Therapy:  Vancomycin trough level 15-20 mcg/ml  Resolution of infection  Plan:  - Change vancomycin to 1500mg  IV Q12H - F/u renal fxn, C&S, clinical status and trough at [facility], PharmD, BCPS Pager # [phone] 04/12/2015 10:38 AM

## 2015-04-12 NOTE — Progress Notes (Signed)
Physical Therapy Treatment Patient Details Name: Andrew [name] MRN: [id number] DOB: [birth year] Today's Date: 04/12/2015    History of Present Illness 69 year old male with diabetes mellitus, hypertension, hyperlipidemia presents with 3 day history of generalized weakness, bilateral lower extremity pain and weakness, lethargy and somnolence, and polyuria. Found to have staph aureus bacteremia, diabetic ketoacidosis/Diabetes mellitus type II uncontrolled, RLE pain/weakness, and acute encephalopathy.    PT Comments    Patient more alert and, though slow with all responses able to verbalize he cannot manage while his son is at work and feels will needs post acute rehab prior to d/c home.  Will continue to follow acutely.  Follow Up Recommendations  SNF     Equipment Recommendations  None recommended by PT    Recommendations for Other Services       Precautions / Restrictions Precautions Precautions: Fall Restrictions Weight Bearing Restrictions: No    Mobility  Bed Mobility Overal bed mobility: Needs Assistance Bed Mobility: Supine to Sit         Sit to sidelying: Min assist;HOB elevated General bed mobility comments: heavy use of rail, mod assist to scoot to edge of bed, increased time allowed with increased patient participation  Transfers Overall transfer level: Needs assistance Equipment used: Rolling walker (2 wheeled) Transfers: Sit to/from [facility] Sit to Stand: Mod assist;+2 safety/equipment Stand pivot transfers: Mod assist       General transfer comment: Patient pulling up on walker, assist to stabilize, but able to lift with help and stand pivot with walker to chair increased time, cues for sequencing and very small shuffling steps  Ambulation/Gait                 Stairs            Wheelchair Mobility    Modified Rankin (Stroke Patients Only)       Balance Overall balance assessment: Needs assistance   Sitting  balance-Leahy Scale: Good     Standing balance support: Bilateral upper extremity supported Standing balance-Leahy Scale: Poor Standing balance comment: minguard with RW for support for static balance, mod support moving bed to chair due to posterior bias                    Cognition Arousal/Alertness: Awake/alert Behavior During Therapy: Flat affect Overall Cognitive Status: No family/caregiver present to determine baseline cognitive functioning                      Exercises      General Comments        Pertinent Vitals/Pain Pain Score: 8  Pain Location: right leg/hip Pain Descriptors / Indicators: Aching Pain Intervention(s): Monitored during session;Repositioned    Home Living                      Prior Function            PT Goals (current goals can now be found in the care plan section) Acute Rehab PT Goals Patient Stated Goal: agreeable to SNF Progress towards PT goals: Progressing toward goals    Frequency  Min 3X/week    PT Plan Discharge plan needs to be updated    Co-evaluation             End of Session Equipment Utilized During Treatment: Gait belt Activity Tolerance: Patient tolerated treatment well Patient left: in chair;with call bell/phone within reach;with chair alarm set     Time:  7425-9563 PT Time Calculation (min) (ACUTE ONLY): 18 min  Charges:  $Therapeutic Activity: 8-22 mins                    G Codes:      WYNN,CYNDI 2015/05/04, 10:16 AM  Magda Kiel, PT [phone] 05-04-2015

## 2015-04-12 NOTE — Progress Notes (Signed)
Long discussion with the patient regarding need for TEE. Cardiology consulted for TEE for MRSA bacteremia. Although he had acute encephalopathy, during my interview, he was alert and oriented x 4. He repeated stating he is on low income and cannot afford more test despite me telling him our Education officer, museum and case manager can help him with that and this test if identify cardiac SBE can actually prevent future admissions.   After repeatedly emphasizing the benefit of TEE, he became emotional and broke into tears. At this point, i do not feel comfortable pressing on the issue. Will let Internal medicine and ID to discuss with him again, if he agreed, cardiology would be happy to do the TEE.  Andrew Corrigan PA Pager: [phone]

## 2015-04-12 NOTE — Progress Notes (Signed)
Patient ID: Andrew [name], male   DOB: 1946-04-17, 69 y.o.   MRN: [id number]         [place] [facility]    Date of Admission:  04/08/2015   Total days of antibiotics 4          Principal Problem:   Bacteremia due to methicillin resistant Staphylococcus aureus Active Problems:   Diabetic ketoacidosis without coma associated with type 2 diabetes mellitus   Acute pain of right thigh   Hypertension   Hyperlipidemia   Alcohol abuse   Tobacco abuse disorder   Acute encephalopathy   Leg weakness   Atherosclerotic peripheral vascular disease   Right thigh pain   Hypokalemia   . antiseptic oral rinse  7 mL Mouth Rinse q12n4p  . aspirin EC  81 mg Oral Daily  . chlorhexidine  15 mL Mouth Rinse BID  . enoxaparin (LOVENOX) injection  40 mg Subcutaneous Daily  . famotidine  20 mg Oral BID  . insulin aspart  0-5 Units Subcutaneous QHS  . insulin aspart  0-9 Units Subcutaneous Q4H  . insulin glargine  16 Units Subcutaneous QHS  . ipratropium-albuterol  3 mL Nebulization BID  . metoprolol  7.5 mg Intravenous 4 times per day  . potassium chloride  40 mEq Oral Q4H  . [START ON 04/13/2015] thiamine  100 mg Oral Daily  . vancomycin  1,500 mg Intravenous Q12H   Review of Systems: Review of systems not obtained due to patient factors.  Past Medical History  Diagnosis Date  . Diabetes mellitus without complication   . Hypertension   . Hyperlipemia   . Blind right eye     Social History  Substance Use Topics  . Smoking status: Current Every Day Smoker -- 1.50 packs/day    Types: Cigarettes  . Smokeless tobacco: None  . Alcohol Use: 16.8 oz/week    28 Cans of beer per week    Family History  Problem Relation Age of Onset  . Diabetes Mellitus II Mother   . Diabetes Mellitus II Father   . Diabetes Mellitus II Sister   . Diabetes Mellitus II Sister    Allergies  Allergen Reactions  . Penicillins Rash and Other (See Comments)    Possibly rash?     OBJECTIVE: Filed Vitals:   04/12/15 0500 04/12/15 1000 04/12/15 1150 04/12/15 1300  BP: 179/57  163/69   Pulse: 75  84   Temp: 98.6 F (37 C)  98.6 F (37 C)   TempSrc: Oral  Oral   Resp: 19  26   Height:    5\' 6"  (1.676 m)  Weight:      SpO2: 95% 95% 94%    Body mass index is 29.83 kg/(m^2).  General: he is a little more alert than yesterday but remains quite somnolent and difficult to arouse. He will grunt occasional one-word answers to questions Skin: no splinter or conjunctival hemorrhages. Abrasion right elbow Lungs: clear Cor: regular S1 and S2 no murmurs Right thigh: palpation of his right lateral thigh proximally does elicit pain but there is no redness, warmth, or fluctuance noted  Lab Results Lab Results  Component Value Date   WBC 11.8* 04/12/2015   HGB 14.9 04/12/2015   HCT 41.2 04/12/2015   MCV 86.6 04/12/2015   PLT 208 04/12/2015    Lab Results  Component Value Date   CREATININE 0.50* 04/12/2015   BUN <5* 04/12/2015   NA 135 04/12/2015   K 3.1* 04/12/2015  CL 100* 04/12/2015   CO2 24 04/12/2015    Lab Results  Component Value Date   ALT 24 04/11/2015   AST 41 04/11/2015   ALKPHOS 94 04/11/2015   BILITOT 0.7 04/11/2015     Microbiology: Recent Results (from the past 240 hour(s))  Culture, blood (routine x 2)     Status: None   Collection Time: 04/08/15  8:15 PM  Result Value Ref Range Status   Specimen Description BLOOD RIGHT ARM  Final   Special Requests BOTTLES DRAWN AEROBIC AND ANAEROBIC 10ML  Final   Culture  Setup Time   Final    GRAM POSITIVE COCCI IN CLUSTERS IN BOTH AEROBIC AND ANAEROBIC BOTTLES CRITICAL RESULT CALLED TO, READ BACK BY AND VERIFIED WITH: J. BOZEMAN,RN AT 0848 ON [id number] BY Rhea Bleacher    Culture METHICILLIN RESISTANT STAPHYLOCOCCUS AUREUS  Final   Report Status 04/11/2015 FINAL  Final   Organism ID, Bacteria METHICILLIN RESISTANT STAPHYLOCOCCUS AUREUS  Final      Susceptibility   Methicillin resistant  staphylococcus aureus - MIC*    CIPROFLOXACIN <=0.5 SENSITIVE Sensitive     ERYTHROMYCIN >=8 RESISTANT Resistant     GENTAMICIN <=0.5 SENSITIVE Sensitive     OXACILLIN >=4 RESISTANT Resistant     TETRACYCLINE <=1 SENSITIVE Sensitive     VANCOMYCIN 1 SENSITIVE Sensitive     TRIMETH/SULFA <=10 SENSITIVE Sensitive     CLINDAMYCIN <=0.25 SENSITIVE Sensitive     RIFAMPIN <=0.5 SENSITIVE Sensitive     Inducible Clindamycin NEGATIVE Sensitive     * METHICILLIN RESISTANT STAPHYLOCOCCUS AUREUS  Culture, blood (routine x 2)     Status: None   Collection Time: 04/08/15  8:25 PM  Result Value Ref Range Status   Specimen Description BLOOD RIGHT FOREARM  Final   Special Requests BOTTLES DRAWN AEROBIC AND ANAEROBIC 10ML  Final   Culture  Setup Time   Final    GRAM POSITIVE COCCI IN CLUSTERS IN BOTH AEROBIC AND ANAEROBIC BOTTLES CRITICAL RESULT CALLED TO, READ BACK BY AND VERIFIED WITH: J BOZEMAN 04/09/15 @ 0949 M VESTAL    Culture   Final    STAPHYLOCOCCUS AUREUS SUSCEPTIBILITIES PERFORMED ON PREVIOUS CULTURE WITHIN THE LAST 5 DAYS.    Report Status 04/11/2015 FINAL  Final  Urine culture     Status: None   Collection Time: 04/08/15  9:20 PM  Result Value Ref Range Status   Specimen Description URINE, CLEAN CATCH  Final   Special Requests NONE  Final   Culture MULTIPLE SPECIES PRESENT, SUGGEST RECOLLECTION  Final   Report Status 04/10/2015 FINAL  Final  Culture, blood (routine x 2)     Status: None (Preliminary result)   Collection Time: 04/10/15  1:20 PM  Result Value Ref Range Status   Specimen Description BLOOD RIGHT ARM  Final   Special Requests BOTTLES DRAWN AEROBIC ONLY 5CC  Final   Culture NO GROWTH < 24 HOURS  Final   Report Status PENDING  Incomplete  Culture, blood (routine x 2)     Status: None (Preliminary result)   Collection Time: 04/10/15  1:30 PM  Result Value Ref Range Status   Specimen Description BLOOD RIGHT HAND  Final   Special Requests IN PEDIATRIC BOTTLE 3CC  Final     Culture NO GROWTH < 24 HOURS  Final   Report Status PENDING  Incomplete   CT OF THE RIGHT HIP WITH CONTRAST; CT OF THE RIGHT LOWER EXTREMITY WITH CONTRAST 04/10/2015  TECHNIQUE: Multidetector CT imaging was  performed following the standard protocol during bolus administration of intravenous contrast.  CONTRAST: 132mL OMNIPAQUE IOHEXOL 300 MG/ML SOLN  COMPARISON: None.  FINDINGS: There is no acute fracture or dislocation. There is no lytic or sclerotic osseous lesion. There is mild generalized osteopenia. The superior and inferior right pubic rami are intact. There is no bone destruction.  The muscles enhance normally and homogeneously. There is a small amount of fluid deep to the extensor compartment thigh muscles. There is no other fluid collection or hematoma. There is peripheral vascular atherosclerotic disease. There is no soft tissue emphysema. There is no radiopaque foreign body.  IMPRESSION: 1. Nonspecific fasciitis involving the extensor compartment of the right thigh which may be secondary to an infectious or inflammatory etiology. There is no drainable fluid collection.   Electronically Signed  By: Kathreen Devoid  On: 04/10/2015 20:07  ASSESSMENT: He has MRSA bacteremia and may have some very early soft tissue infection in his right thigh.I would simply continue vancomycin for now and see how his mental status and right thigh pain respond to vancomycin. I would ask cardiology to consider TEE early next week. Repeat blood cultures remain negative. If they are negative tomorrow it would be okay to place PICC.  PLAN: 1. Continue vancomycin 2. Monitor repeat blood cultures 3. TEE soon 4. Please call Dr. Talbot Grumbling [phone]) for any infectious disease questions this weekend  Michel Bickers, MD [facility] for [place] [phone] pager   [phone] cell 04/12/2015, 2:15 PM

## 2015-04-12 NOTE — Progress Notes (Signed)
[facility] PROGRESS NOTE  Andrew [name] NAT:[id number] DOB: [birth year] DOA: 04/08/2015 PCP: No primary care provider on file.  Brief history 69 year old male with diabetes mellitus, hypertension, hyperlipidemia presents with 3 day history of generalized weakness, bilateral lower extremity pain and weakness, lethargy and somnolence, and polyuria. The patient lives with his son, but unfortunately he and his son both drink on a daily basis. The patient's daughter provides supplemental history, and she states that he has been drinking daily for at least the last 3 years. She is not clear exactly how much he drinks but his primary alcohol is beer. Currently, the patient is encephalopathic and unable to provide any history. Upon presentation, the patient was noted to have serum glucose 424 with anion gap of 20. The patient was started on intravenous fluids with improvement of his anion gap and serum glucose. Blood cultures were obtained and are growing MRSA.   Assessment/Plan: #1 MRSA  bacteremia Preliminary blood cultures 2 out of 2 positive for staph aureus bacteremia. Patient has been seen in consultation by infectious diseases and recommend continue IV vancomycin. IV Levaquin has been discontinued. 2-D echo has been done with a EF of 65-70%. No wall motion abnormalities. Mild aortic valvular stenosis. Grade 1 diastolic dysfunction. Patient still with complaints of right thigh pain. Plain films of the right thigh was negative. CT of the right hip and right thigh show nonspecific fasciitis/very early soft tissue infection. Continue IV vancomycin. ID recommended TEE early next week to rule out vegetations. Will ask cardiology for TEE hopefully home on day. Per ID if repeat blood cultures continued to remain negative tomorrow okay to place a PICC line. ID following and appreciate input and recommendations.  #2 acute encephalopathy Likely multifactorial secondary to dehydration, history of alcohol  use, bacteremia and DKA. Patient eyes closed. Patient was alert early on per nursing. CT head negative. B-12 levels within normal limits. TSH within normal limits. Ammonia level at 19. Patient has been seen by speech therapy and patient placed on a dysphagia 2 diet. Continue empiric IV vancomycin. Follow.  #3 diabetic ketoacidosis/Diabetes mellitus type II uncontrolled Anion gap closed and patient has been transitioned to subcutaneous insulin. Hemoglobin A1c13.8. CBGs have ranged from 132-211. Continue Lantus 16 units daily. Sliding scale insulin. Continue current dysphagia 2 diet. Follow.  #4 right leg pain/weakness B-12 levels within normal limits. TSH within normal limits. MRI of the T and L-spine with no source of infection. No definite cord pathology or acute fracture noted on MRI of the T-spine. MRI of the L-spine with no acute fracture or malalignment. Mild canal stenosis at L3-4. Neural foraminal narrowing L2-3 through L5-S1. Moderate to severe on the right at L5-S1. Tiny) right central L5-S1 disc extrusion. Plain films of the right thigh were negative. CT scan of the right thigh with and nonspecific fasciitis involving the extensor compartment of the right thigh may be secondary to an infectious or inflammatory etiology. No abscess noted. Continue empiric IV antibiotics. Follow.   #5 hyponatremia/hypokalemia/hypomagnesemia Likely secondary to problem #3. Hyponatremia has improved. Replete potassium and magnesium. Keep magnesium greater than 2.  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 Patient currently sleeping and mental status slightly more improved than yesterday as he was more alert earlier on today. Patient denies any abdominal pain. Blood pressure has been stable. Discussed with vascular surgery, Dr. Donnetta Hutching who will assess the patient and patient likely needs outpatient follow-up with surveillance ultrasound in about 6 months.   #7 ETOH dependence Continue  the alcohol withdrawal  protocol. Scheduled Ativan has been discontinued secondary to patient's increased lethargy. Continue when necessary Ativan. Continue thiamine and folic acid.  #8 prophylaxis Lovenox for DVT prophylaxis.   Code Status: Full Family Communication: No family at bedside. Disposition Plan: Likely skilled nursing facility   Consultants:  ID: Dr. Megan Salon 04/10/2015    Procedures:  Plain films right femur 04/08/2015  2-D echo 04/09/2015,   MRI T and L spine 04/09/2015  Chest x-ray 04/08/2015  CT head 04/10/2015  Antibiotics:  IV Vancomycin 04/09/2015  IV Levaquin 04/09/2015>>>>> 04/09/2015  HPI/Subjective: Patient sleeping. Per nursing patient was slightly more alert earlier on today than he was yesterday.  Objective: Filed Vitals:   04/12/15 1150  BP: 163/69  Pulse: 84  Temp: 98.6 F (37 C)  Resp: 26    Intake/Output Summary (Last 24 hours) at 04/12/15 1537 Last data filed at 04/12/15 1400  Gross per 24 hour  Intake 3944.59 ml  Output      0 ml  Net 3944.59 ml   Filed Weights   04/09/15 0242 04/10/15 0528  Weight: 82.7 kg (182 lb 5.1 oz) 83.8 kg (184 lb 11.9 oz)    Exam:   General:  NAD  Cardiovascular: RRR  Respiratory: CTAB  Abdomen: Soft/NT/ND/+BS  Musculoskeletal: No c/c/e.   Data Reviewed: Basic Metabolic Panel:  Recent Labs Lab 04/09/15 0502 04/09/15 1052 04/09/15 1416 04/10/15 0324 04/11/15 0404 04/12/15 0341  NA 130* 131* 130* 133* 135 135  K 4.0 3.7 3.5 3.6 3.1* 3.1*  CL 97* 100* 101 100* 102 100*  CO2 19* 21* 18* 17* 24 24  GLUCOSE 322* 225* 207* 297* 142* 142*  BUN 16 15 14 13 7  <5*  CREATININE 1.04 0.88 0.85 1.03 0.52* 0.50*  CALCIUM 8.6* 8.7* 8.3* 8.5* 8.3* 8.3*  MG 1.5*  --   --   --   --  1.2*  PHOS 3.2  --   --   --   --   --    Liver Function Tests:  Recent Labs Lab 04/08/15 2049 04/09/15 0502 04/11/15 0404  AST 31 25 41  ALT 13* 13* 24  ALKPHOS 75 72 94  BILITOT 0.9 1.0 0.7  PROT 6.0* 5.4* 5.1*   ALBUMIN 3.0* 2.7* 2.2*    Recent Labs Lab 04/08/15 2049  LIPASE 12*    Recent Labs Lab 04/08/15 2046  AMMONIA 19   CBC:  Recent Labs Lab 04/08/15 1416 04/09/15 0502 04/10/15 0324 04/11/15 0404 04/12/15 0341  WBC 17.7* 16.5* 12.7* 11.6* 11.8*  HGB 17.0 15.1 14.9 14.2 14.9  HCT 47.4 43.0 42.3 40.3 41.2  MCV 88.6 88.3 88.5 87.0 86.6  PLT 223 206 191 165 208   Cardiac Enzymes:  Recent Labs Lab 04/09/15 1555  CKTOTAL 338   BNP (last 3 results) No results for input(s): BNP in the last 8760 hours.  ProBNP (last 3 results) No results for input(s): PROBNP in the last 8760 hours.  CBG:  Recent Labs Lab 04/11/15 1952 04/12/15 0020 04/12/15 0402 04/12/15 0819 04/12/15 1138  GLUCAP 120* 161* 132* 162* 211*    Recent Results (from the past 240 hour(s))  Culture, blood (routine x 2)     Status: None   Collection Time: 04/08/15  8:15 PM  Result Value Ref Range Status   Specimen Description BLOOD RIGHT ARM  Final   Special Requests BOTTLES DRAWN AEROBIC AND ANAEROBIC 10ML  Final   Culture  Setup Time   Final  GRAM POSITIVE COCCI IN CLUSTERS IN BOTH AEROBIC AND ANAEROBIC BOTTLES CRITICAL RESULT CALLED TO, READ BACK BY AND VERIFIED WITH: J. BOZEMAN,RN AT 0848 ON [id number] BY Rhea Bleacher    Culture METHICILLIN RESISTANT STAPHYLOCOCCUS AUREUS  Final   Report Status 04/11/2015 FINAL  Final   Organism ID, Bacteria METHICILLIN RESISTANT STAPHYLOCOCCUS AUREUS  Final      Susceptibility   Methicillin resistant staphylococcus aureus - MIC*    CIPROFLOXACIN <=0.5 SENSITIVE Sensitive     ERYTHROMYCIN >=8 RESISTANT Resistant     GENTAMICIN <=0.5 SENSITIVE Sensitive     OXACILLIN >=4 RESISTANT Resistant     TETRACYCLINE <=1 SENSITIVE Sensitive     VANCOMYCIN 1 SENSITIVE Sensitive     TRIMETH/SULFA <=10 SENSITIVE Sensitive     CLINDAMYCIN <=0.25 SENSITIVE Sensitive     RIFAMPIN <=0.5 SENSITIVE Sensitive     Inducible Clindamycin NEGATIVE Sensitive     * METHICILLIN  RESISTANT STAPHYLOCOCCUS AUREUS  Culture, blood (routine x 2)     Status: None   Collection Time: 04/08/15  8:25 PM  Result Value Ref Range Status   Specimen Description BLOOD RIGHT FOREARM  Final   Special Requests BOTTLES DRAWN AEROBIC AND ANAEROBIC 10ML  Final   Culture  Setup Time   Final    GRAM POSITIVE COCCI IN CLUSTERS IN BOTH AEROBIC AND ANAEROBIC BOTTLES CRITICAL RESULT CALLED TO, READ BACK BY AND VERIFIED WITH: J BOZEMAN 04/09/15 @ 0949 M VESTAL    Culture   Final    STAPHYLOCOCCUS AUREUS SUSCEPTIBILITIES PERFORMED ON PREVIOUS CULTURE WITHIN THE LAST 5 DAYS.    Report Status 04/11/2015 FINAL  Final  Urine culture     Status: None   Collection Time: 04/08/15  9:20 PM  Result Value Ref Range Status   Specimen Description URINE, CLEAN CATCH  Final   Special Requests NONE  Final   Culture MULTIPLE SPECIES PRESENT, SUGGEST RECOLLECTION  Final   Report Status 04/10/2015 FINAL  Final  Culture, blood (routine x 2)     Status: None (Preliminary result)   Collection Time: 04/10/15  1:20 PM  Result Value Ref Range Status   Specimen Description BLOOD RIGHT ARM  Final   Special Requests BOTTLES DRAWN AEROBIC ONLY 5CC  Final   Culture NO GROWTH 2 DAYS  Final   Report Status PENDING  Incomplete  Culture, blood (routine x 2)     Status: None (Preliminary result)   Collection Time: 04/10/15  1:30 PM  Result Value Ref Range Status   Specimen Description BLOOD RIGHT HAND  Final   Special Requests IN PEDIATRIC BOTTLE 3CC  Final   Culture NO GROWTH 2 DAYS  Final   Report Status PENDING  Incomplete     Studies: Ct Hip Right W Contrast  04/10/2015   CLINICAL DATA:  Chronic right hip and thigh pain.  No trauma.  EXAM: CT OF THE RIGHT HIP WITH CONTRAST; CT OF THE RIGHT LOWER EXTREMITY WITH CONTRAST  TECHNIQUE: Multidetector CT imaging was performed following the standard protocol during bolus administration of intravenous contrast.  CONTRAST:  137mL OMNIPAQUE IOHEXOL 300 MG/ML  SOLN   COMPARISON:  None.  FINDINGS: There is no acute fracture or dislocation. There is no lytic or sclerotic osseous lesion. There is mild generalized osteopenia. The superior and inferior right pubic rami are intact. There is no bone destruction.  The muscles enhance normally and homogeneously. There is a small amount of fluid deep to the extensor compartment thigh muscles. There is no other  fluid collection or hematoma. There is peripheral vascular atherosclerotic disease. There is no soft tissue emphysema. There is no radiopaque foreign body.  IMPRESSION: 1. Nonspecific fasciitis involving the extensor compartment of the right thigh which may be secondary to an infectious or inflammatory etiology. There is no drainable fluid collection.   Electronically Signed   By: Kathreen Devoid   On: 04/10/2015 20:07   Ct Femur Right W Contrast  04/10/2015   CLINICAL DATA:  Chronic right hip and thigh pain.  No trauma.  EXAM: CT OF THE RIGHT HIP WITH CONTRAST; CT OF THE RIGHT LOWER EXTREMITY WITH CONTRAST  TECHNIQUE: Multidetector CT imaging was performed following the standard protocol during bolus administration of intravenous contrast.  CONTRAST:  140mL OMNIPAQUE IOHEXOL 300 MG/ML  SOLN  COMPARISON:  None.  FINDINGS: There is no acute fracture or dislocation. There is no lytic or sclerotic osseous lesion. There is mild generalized osteopenia. The superior and inferior right pubic rami are intact. There is no bone destruction.  The muscles enhance normally and homogeneously. There is a small amount of fluid deep to the extensor compartment thigh muscles. There is no other fluid collection or hematoma. There is peripheral vascular atherosclerotic disease. There is no soft tissue emphysema. There is no radiopaque foreign body.  IMPRESSION: 1. Nonspecific fasciitis involving the extensor compartment of the right thigh which may be secondary to an infectious or inflammatory etiology. There is no drainable fluid collection.    Electronically Signed   By: Kathreen Devoid   On: 04/10/2015 20:07    Scheduled Meds: . antiseptic oral rinse  7 mL Mouth Rinse q12n4p  . aspirin EC  81 mg Oral Daily  . chlorhexidine  15 mL Mouth Rinse BID  . enoxaparin (LOVENOX) injection  40 mg Subcutaneous Daily  . famotidine  20 mg Oral BID  . insulin aspart  0-5 Units Subcutaneous QHS  . insulin aspart  0-9 Units Subcutaneous Q4H  . insulin glargine  16 Units Subcutaneous QHS  . ipratropium-albuterol  3 mL Nebulization BID  . magnesium sulfate 1 - 4 g bolus IVPB  4 g Intravenous Once  . metoprolol  7.5 mg Intravenous 4 times per day  . [START ON 04/13/2015] thiamine  100 mg Oral Daily  . vancomycin  1,500 mg Intravenous Q12H   Continuous Infusions:    Principal Problem:   Bacteremia due to methicillin resistant Staphylococcus aureus Active Problems:   Hypertension   Hyperlipidemia   Alcohol abuse   Tobacco abuse disorder   Acute encephalopathy   Diabetic ketoacidosis without coma associated with type 2 diabetes mellitus   Leg weakness   Atherosclerotic peripheral vascular disease   Acute pain of right thigh   Right thigh pain   Hypokalemia   Saccular aneurysm: 4.8cm infrarenal AAA per MRI (04/09/2015)    Time spent: 33 mins    [facility] MD [facility] Pager [phone]. If 7PM-7AM, please contact night-coverage at [email or web address], password [facility] 04/12/2015, 3:37 PM  LOS: 4 days

## 2015-04-13 DIAGNOSIS — I10 Essential (primary) hypertension: Secondary | ICD-10-CM | POA: Clinically undetermined

## 2015-04-13 DIAGNOSIS — I714 Abdominal aortic aneurysm, without rupture: Secondary | ICD-10-CM

## 2015-04-13 LAB — GLUCOSE, CAPILLARY
GLUCOSE-CAPILLARY: 163 mg/dL — AB (ref 65–99)
GLUCOSE-CAPILLARY: 204 mg/dL — AB (ref 65–99)
GLUCOSE-CAPILLARY: 208 mg/dL — AB (ref 65–99)
GLUCOSE-CAPILLARY: 234 mg/dL — AB (ref 65–99)
Glucose-Capillary: 114 mg/dL — ABNORMAL HIGH (ref 65–99)
Glucose-Capillary: 274 mg/dL — ABNORMAL HIGH (ref 65–99)

## 2015-04-13 LAB — CBC
HCT: 38 % — ABNORMAL LOW (ref 39.0–52.0)
Hemoglobin: 13.4 g/dL (ref 13.0–17.0)
MCH: 30.6 pg (ref 26.0–34.0)
MCHC: 35.3 g/dL (ref 30.0–36.0)
MCV: 86.8 fL (ref 78.0–100.0)
PLATELETS: 218 10*3/uL (ref 150–400)
RBC: 4.38 MIL/uL (ref 4.22–5.81)
RDW: 13 % (ref 11.5–15.5)
WBC: 8.9 10*3/uL (ref 4.0–10.5)

## 2015-04-13 LAB — BASIC METABOLIC PANEL
ANION GAP: 7 (ref 5–15)
BUN: 7 mg/dL (ref 6–20)
CALCIUM: 8.2 mg/dL — AB (ref 8.9–10.3)
CHLORIDE: 102 mmol/L (ref 101–111)
CO2: 27 mmol/L (ref 22–32)
CREATININE: 0.49 mg/dL — AB (ref 0.61–1.24)
GFR calc non Af Amer: >60 mL/min (ref >60)
GLUCOSE: 183 mg/dL — AB (ref 65–99)
Potassium: 3.4 mmol/L — ABNORMAL LOW (ref 3.5–5.1)
SODIUM: 136 mmol/L (ref 135–145)

## 2015-04-13 LAB — MAGNESIUM: MAGNESIUM: 1.8 mg/dL (ref 1.7–2.4)

## 2015-04-13 MED ORDER — MAGNESIUM SULFATE 2 GM/50ML IV SOLN
2.0000 g | Freq: Once | INTRAVENOUS | Status: AC
  Administered 2015-04-13: 2 g via INTRAVENOUS
  Filled 2015-04-13 (×2): qty 50

## 2015-04-13 MED ORDER — LORAZEPAM 2 MG/ML IJ SOLN
1.0000 mg | Freq: Four times a day (QID) | INTRAMUSCULAR | Status: DC | PRN
  Administered 2015-04-13: 1 mg via INTRAVENOUS
  Filled 2015-04-13: qty 1

## 2015-04-13 MED ORDER — LORAZEPAM 1 MG PO TABS
1.0000 mg | ORAL_TABLET | Freq: Four times a day (QID) | ORAL | Status: DC | PRN
  Administered 2015-04-14: 1 mg via ORAL
  Filled 2015-04-13: qty 1

## 2015-04-13 MED ORDER — METOPROLOL TARTRATE 25 MG PO TABS
25.0000 mg | ORAL_TABLET | Freq: Two times a day (BID) | ORAL | Status: DC
  Administered 2015-04-13 – 2015-04-14 (×3): 25 mg via ORAL
  Filled 2015-04-13 (×3): qty 1

## 2015-04-13 MED ORDER — ADULT MULTIVITAMIN W/MINERALS CH
1.0000 | ORAL_TABLET | Freq: Every day | ORAL | Status: DC
  Administered 2015-04-13 – 2015-04-15 (×3): 1 via ORAL
  Filled 2015-04-13 (×3): qty 1

## 2015-04-13 MED ORDER — POTASSIUM CHLORIDE CRYS ER 20 MEQ PO TBCR
40.0000 meq | EXTENDED_RELEASE_TABLET | Freq: Once | ORAL | Status: AC
  Administered 2015-04-13: 40 meq via ORAL
  Filled 2015-04-13: qty 2

## 2015-04-13 MED ORDER — AMLODIPINE BESYLATE 5 MG PO TABS
5.0000 mg | ORAL_TABLET | Freq: Every day | ORAL | Status: DC
  Administered 2015-04-13: 5 mg via ORAL
  Filled 2015-04-13: qty 1
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
## 2015-04-13 NOTE — Progress Notes (Signed)
[facility] PROGRESS NOTE  Andrew [name] UYQ:[id number] DOB: Nov 28, 1945 DOA: 04/08/2015 PCP: No primary care provider on file.  Brief history 69 year old male with diabetes mellitus, hypertension, hyperlipidemia presents with 3 day history of generalized weakness, bilateral lower extremity pain and weakness, lethargy and somnolence, and polyuria. The patient lives with his son, but unfortunately he and his son both drink on a daily basis. The patient's daughter provides supplemental history, and she states that he has been drinking daily for at least the last 3 years. She is not clear exactly how much he drinks but his primary alcohol is beer. Currently, the patient is encephalopathic and unable to provide any history. Upon presentation, the patient was noted to have serum glucose 424 with anion gap of 20. The patient was started on intravenous fluids with improvement of his anion gap and serum glucose. Blood cultures were obtained and are growing MRSA.   Assessment/Plan: #1 MRSA  bacteremia Preliminary blood cultures 2 out of 2 positive for staph aureus bacteremia. Patient has been seen in consultation by infectious diseases and recommend continue IV vancomycin. IV Levaquin has been discontinued. 2-D echo has been done with a EF of 65-70%. No wall motion abnormalities. Mild aortic valvular stenosis. Grade 1 diastolic dysfunction. Patient still with complaints of right thigh pain. Plain films of the right thigh was negative. CT of the right hip and right thigh show nonspecific fasciitis/very early soft tissue infection. Continue IV vancomycin. ID recommended TEE early next week to rule out vegetations. Cardiology was asked to assess patient for TEE however patient kept repeating to cardiology PA that he was on a low income and cannot afford any more tests and a such cardiology will hold off until patient is in agreement. Spoke with patient concerning TEE and need for it to rule out vegetations  however patient is concerned about his finances and patient insistent on being discharged home today in order to take care of some business. Severity of patient's illness was explained to the patient and told that he was not medically stable at this time to be discharged. It was explained to patient that if he wanted to leave he would have to leave [place]. Patient's family to speak with patient. If repeat blood cultures continued to remain negative tomorrow okay to place a PICC line. ID following and appreciate input and recommendations. Patient will likely need at least 6-8 weeks of IV antibiotics.  #2 acute encephalopathy Likely multifactorial secondary to dehydration, history of alcohol use, bacteremia and DKA. Patient is more alert today interacting and following commands and answering questions appropriately. CT head negative. B-12 levels within normal limits. TSH within normal limits. Ammonia level at 19. Patient has been seen by speech therapy and diet has been advanced to a regular diet with thin liquids. Continue empiric IV vancomycin. Follow.  #3 diabetic ketoacidosis/Diabetes mellitus type II uncontrolled Anion gap closed and patient has been transitioned to subcutaneous insulin. Hemoglobin A1c13.8. CBGs have ranged from 132-211. Continue Lantus 16 units daily. Sliding scale insulin. Patient's diet has been advanced to a regular diet which will change to carb modified diet.  #4 right leg pain/weakness B-12 levels within normal limits. TSH within normal limits. MRI of the T and L-spine with no source of infection. No definite cord pathology or acute fracture noted on MRI of the T-spine. MRI of the L-spine with no acute fracture or malalignment. Mild canal stenosis at L3-4. Neural foraminal narrowing L2-3 through L5-S1. Moderate to severe on the  right at L5-S1. Tiny) right central L5-S1 disc extrusion. Plain films of the right thigh were negative. CT scan of the right thigh with and  nonspecific fasciitis involving the extensor compartment of the right thigh may be secondary to an infectious or inflammatory etiology. No abscess noted. Continue empiric IV antibiotics. Follow.   #5 hyponatremia/hypokalemia/hypomagnesemia Likely secondary to problem #3. Hyponatremia has improved. Replete potassium and magnesium. Keep magnesium greater than 2.  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 Patient hasn't seen in consultation by vascular surgery, Dr. Donnetta Hutching was recommending outpatient follow-up with repeat ultrasound in 6 months. Vascular surgery recommending that if aneurysm reached 5.5-6 cm would consider elective repair. Appreciate vascular surgeries input and recommendations.   #7 ETOH dependence Continue the alcohol withdrawal protocol. Scheduled Ativan has been discontinued secondary to patient's increased lethargy. Patient is more alert. Patient with no signs of DTs at this time. Continue when necessary Ativan. Continue thiamine and folic acid.  #8 hypertension Change IV Lopressor to oral Lopressor. Add Norvasc for better blood pressure control.  #9 prophylaxis Lovenox for DVT prophylaxis.   Code Status: Full Family Communication: Updated patient and son at bedside. Disposition Plan: Likely needs skilled nursing facility   Consultants:  ID: Dr. Megan Salon 04/10/2015  Vascular surgery: Dr. Donnetta Hutching 04/13/2015  Procedures:  Plain films right femur 04/08/2015  2-D echo 04/09/2015,   MRI T and L spine 04/09/2015  Chest x-ray 04/08/2015  CT head 04/10/2015  Antibiotics:  IV Vancomycin 04/09/2015  IV Levaquin 04/09/2015>>>>> 04/09/2015  HPI/Subjective: Patient alert and following commands. Patient denies any chest pain. No shortness of breath. No abdominal pain. Patient states he needs to leave today, see has business to take care of at home. Patient is concerned of his finances.  Objective: Filed Vitals:   04/13/15 0418  BP: 169/65  Pulse: 89   Temp: 98.3 F (36.8 C)  Resp: 19    Intake/Output Summary (Last 24 hours) at 04/13/15 1557 Last data filed at 04/13/15 0905  Gross per 24 hour  Intake    980 ml  Output    700 ml  Net    280 ml   Filed Weights   04/09/15 0242 04/10/15 0528  Weight: 82.7 kg (182 lb 5.1 oz) 83.8 kg (184 lb 11.9 oz)    Exam:   General:  NAD. Alert.  Cardiovascular: RRR  Respiratory: CTAB  Abdomen: Soft/NT/ND/+BS  Musculoskeletal: No c/c/e.   Data Reviewed: Basic Metabolic Panel:  Recent Labs Lab 04/09/15 0502  04/09/15 1416 04/10/15 0324 04/11/15 0404 04/12/15 0341 04/13/15 0252  NA 130*  < > 130* 133* 135 135 136  K 4.0  < > 3.5 3.6 3.1* 3.1* 3.4*  CL 97*  < > 101 100* 102 100* 102  CO2 19*  < > 18* 17* 24 24 27   GLUCOSE 322*  < > 207* 297* 142* 142* 183*  BUN 16  < > 14 13 7  <5* 7  CREATININE 1.04  < > 0.85 1.03 0.52* 0.50* 0.49*  CALCIUM 8.6*  < > 8.3* 8.5* 8.3* 8.3* 8.2*  MG 1.5*  --   --   --   --  1.2* 1.8  PHOS 3.2  --   --   --   --   --   --   < > = values in this interval not displayed. Liver Function Tests:  Recent Labs Lab 04/08/15 2049 04/09/15 0502 04/11/15 0404  AST 31 25 41  ALT 13* 13* 24  ALKPHOS 75 72 94  BILITOT 0.9 1.0 0.7  PROT 6.0* 5.4* 5.1*  ALBUMIN 3.0* 2.7* 2.2*    Recent Labs Lab 04/08/15 2049  LIPASE 12*    Recent Labs Lab 04/08/15 2046  AMMONIA 19   CBC:  Recent Labs Lab 04/09/15 0502 04/10/15 0324 04/11/15 0404 04/12/15 0341 04/13/15 0252  WBC 16.5* 12.7* 11.6* 11.8* 8.9  HGB 15.1 14.9 14.2 14.9 13.4  HCT 43.0 42.3 40.3 41.2 38.0*  MCV 88.3 88.5 87.0 86.6 86.8  PLT 206 191 165 208 218   Cardiac Enzymes:  Recent Labs Lab 04/09/15 1555  CKTOTAL 338   BNP (last 3 results) No results for input(s): BNP in the last 8760 hours.  ProBNP (last 3 results) No results for input(s): PROBNP in the last 8760 hours.  CBG:  Recent Labs Lab 04/13/15 0003 04/13/15 0414 04/13/15 0812 04/13/15 1205 04/13/15 1532   GLUCAP 234* 163* 114* 274* 204*    Recent Results (from the past 240 hour(s))  Culture, blood (routine x 2)     Status: None   Collection Time: 04/08/15  8:15 PM  Result Value Ref Range Status   Specimen Description BLOOD RIGHT ARM  Final   Special Requests BOTTLES DRAWN AEROBIC AND ANAEROBIC 10ML  Final   Culture  Setup Time   Final    GRAM POSITIVE COCCI IN CLUSTERS IN BOTH AEROBIC AND ANAEROBIC BOTTLES CRITICAL RESULT CALLED TO, READ BACK BY AND VERIFIED WITH: J. BOZEMAN,RN AT 0848 ON [id number] BY Rhea Bleacher    Culture METHICILLIN RESISTANT STAPHYLOCOCCUS AUREUS  Final   Report Status 04/11/2015 FINAL  Final   Organism ID, Bacteria METHICILLIN RESISTANT STAPHYLOCOCCUS AUREUS  Final      Susceptibility   Methicillin resistant staphylococcus aureus - MIC*    CIPROFLOXACIN <=0.5 SENSITIVE Sensitive     ERYTHROMYCIN >=8 RESISTANT Resistant     GENTAMICIN <=0.5 SENSITIVE Sensitive     OXACILLIN >=4 RESISTANT Resistant     TETRACYCLINE <=1 SENSITIVE Sensitive     VANCOMYCIN 1 SENSITIVE Sensitive     TRIMETH/SULFA <=10 SENSITIVE Sensitive     CLINDAMYCIN <=0.25 SENSITIVE Sensitive     RIFAMPIN <=0.5 SENSITIVE Sensitive     Inducible Clindamycin NEGATIVE Sensitive     * METHICILLIN RESISTANT STAPHYLOCOCCUS AUREUS  Culture, blood (routine x 2)     Status: None   Collection Time: 04/08/15  8:25 PM  Result Value Ref Range Status   Specimen Description BLOOD RIGHT FOREARM  Final   Special Requests BOTTLES DRAWN AEROBIC AND ANAEROBIC 10ML  Final   Culture  Setup Time   Final    GRAM POSITIVE COCCI IN CLUSTERS IN BOTH AEROBIC AND ANAEROBIC BOTTLES CRITICAL RESULT CALLED TO, READ BACK BY AND VERIFIED WITH: J BOZEMAN 04/09/15 @ 0949 M VESTAL    Culture   Final    STAPHYLOCOCCUS AUREUS SUSCEPTIBILITIES PERFORMED ON PREVIOUS CULTURE WITHIN THE LAST 5 DAYS.    Report Status 04/11/2015 FINAL  Final  Urine culture     Status: None   Collection Time: 04/08/15  9:20 PM  Result Value Ref  Range Status   Specimen Description URINE, CLEAN CATCH  Final   Special Requests NONE  Final   Culture MULTIPLE SPECIES PRESENT, SUGGEST RECOLLECTION  Final   Report Status 04/10/2015 FINAL  Final  Culture, blood (routine x 2)     Status: None (Preliminary result)   Collection Time: 04/10/15  1:20 PM  Result Value Ref Range Status   Specimen Description BLOOD  RIGHT ARM  Final   Special Requests BOTTLES DRAWN AEROBIC ONLY 5CC  Final   Culture NO GROWTH 3 DAYS  Final   Report Status PENDING  Incomplete  Culture, blood (routine x 2)     Status: None (Preliminary result)   Collection Time: 04/10/15  1:30 PM  Result Value Ref Range Status   Specimen Description BLOOD RIGHT HAND  Final   Special Requests IN PEDIATRIC BOTTLE 3CC  Final   Culture NO GROWTH 3 DAYS  Final   Report Status PENDING  Incomplete     Studies: No results found.  Scheduled Meds: . antiseptic oral rinse  7 mL Mouth Rinse q12n4p  . aspirin EC  81 mg Oral Daily  . chlorhexidine  15 mL Mouth Rinse BID  . enoxaparin (LOVENOX) injection  40 mg Subcutaneous Daily  . famotidine  20 mg Oral BID  . insulin aspart  0-5 Units Subcutaneous QHS  . insulin aspart  0-9 Units Subcutaneous Q4H  . insulin glargine  16 Units Subcutaneous QHS  . ipratropium-albuterol  3 mL Nebulization BID  . magnesium sulfate 1 - 4 g bolus IVPB  2 g Intravenous Once  . metoprolol  7.5 mg Intravenous 4 times per day  . thiamine  100 mg Oral Daily  . vancomycin  1,500 mg Intravenous Q12H   Continuous Infusions:    Principal Problem:   Bacteremia due to methicillin resistant Staphylococcus aureus Active Problems:   Hypertension   Hyperlipidemia   Alcohol abuse   Tobacco abuse disorder   Acute encephalopathy   Diabetic ketoacidosis without coma associated with type 2 diabetes mellitus   Leg weakness   Atherosclerotic peripheral vascular disease   Acute pain of right thigh   Right thigh pain   Hypokalemia   Saccular aneurysm: 4.8cm  infrarenal AAA per MRI (04/09/2015)   Hypomagnesemia    Time spent: 13 mins    [facility] MD [facility] Pager [phone]. If 7PM-7AM, please contact night-coverage at [email or web address], password [facility] 04/13/2015, 3:57 PM  LOS: 5 days

## 2015-04-13 NOTE — Progress Notes (Signed)
Pt very agitated and wanting to go home.  Walked with rolling walker around the dept.  Called Dr. Grandville Silos and asked for sedative med.

## 2015-04-13 NOTE — Progress Notes (Signed)
Speech Language Pathology Treatment: Dysphagia  Patient Details Name: Andrew [name] MRN: [id number] DOB: 1945/10/28 Today's Date: 04/13/2015 Time: 4099-2780 SLP Time Calculation (min) (ACUTE ONLY): 15 min  Assessment / Plan / Recommendation Clinical Impression  Pt continues with improved safety with swallowing.  Still presents with mild confusion, but is eating safely and protecting his airway now that encephalopathy is resolving.  Recommend advancing diet to regular solids, thin liquids.  No present concerns for aspiration.  SLP to sign off.    HPI Other Pertinent Information: 69 year old male with diabetes mellitus, hypertension, hyperlipidemia presents with 3 day history of generalized weakness, bilateral lower extremity pain and weakness, lethargy and somnolence, and polyuria. Found to have staph aureus bacteremia, diabetic ketoacidosis/Diabetes mellitus type II uncontrolled, RLE pain/weakness, and acute encephalopathy.   Pertinent Vitals Pain Assessment: No/denies pain  SLP Plan  All goals met    Recommendations Diet recommendations: Regular;Thin liquid Liquids provided via: Cup;Straw Medication Administration: Whole meds with liquid Supervision: Patient able to self feed;Intermittent supervision to cue for compensatory strategies Compensations: Slow rate;Small sips/bites              Oral Care Recommendations: Oral care BID Plan: All goals met        [name] 04/13/2015, 10:39 AM

## 2015-04-14 ENCOUNTER — Other Ambulatory Visit: Payer: Self-pay | Admitting: *Deleted

## 2015-04-14 DIAGNOSIS — I714 Abdominal aortic aneurysm, without rupture, unspecified: Secondary | ICD-10-CM

## 2015-04-14 LAB — CBC
HEMATOCRIT: 43.3 % (ref 39.0–52.0)
Hemoglobin: 15 g/dL (ref 13.0–17.0)
MCH: 30.8 pg (ref 26.0–34.0)
MCHC: 34.6 g/dL (ref 30.0–36.0)
MCV: 88.9 fL (ref 78.0–100.0)
Platelets: 274 10*3/uL (ref 150–400)
RBC: 4.87 MIL/uL (ref 4.22–5.81)
RDW: 13.3 % (ref 11.5–15.5)
WBC: 9.1 10*3/uL (ref 4.0–10.5)

## 2015-04-14 LAB — BASIC METABOLIC PANEL
Anion gap: 10 (ref 5–15)
BUN: <5 mg/dL — ABNORMAL LOW (ref 6–20)
CALCIUM: 8.7 mg/dL — AB (ref 8.9–10.3)
CO2: 28 mmol/L (ref 22–32)
CREATININE: 0.51 mg/dL — AB (ref 0.61–1.24)
Chloride: 99 mmol/L — ABNORMAL LOW (ref 101–111)
GFR calc Af Amer: >60 mL/min (ref >60)
GFR calc non Af Amer: >60 mL/min (ref >60)
GLUCOSE: 202 mg/dL — AB (ref 65–99)
Potassium: 3.4 mmol/L — ABNORMAL LOW (ref 3.5–5.1)
Sodium: 137 mmol/L (ref 135–145)

## 2015-04-14 LAB — GLUCOSE, CAPILLARY
Glucose-Capillary: 167 mg/dL — ABNORMAL HIGH (ref 65–99)
Glucose-Capillary: 259 mg/dL — ABNORMAL HIGH (ref 65–99)
Glucose-Capillary: 250 mg/dL — ABNORMAL HIGH (ref 65–99)
Glucose-Capillary: 257 mg/dL — ABNORMAL HIGH (ref 65–99)

## 2015-04-14 LAB — VANCOMYCIN, TROUGH: Vancomycin Tr: 13 ug/mL (ref 10.0–20.0)

## 2015-04-14 LAB — MAGNESIUM: Magnesium: 1.7 mg/dL (ref 1.7–2.4)

## 2015-04-14 MED ORDER — INSULIN ASPART 100 UNIT/ML ~~LOC~~ SOLN
0.0000 [IU] | Freq: Three times a day (TID) | SUBCUTANEOUS | Status: DC
  Administered 2015-04-15 (×2): 3 [IU] via SUBCUTANEOUS
  Administered 2015-04-15: 2 [IU] via SUBCUTANEOUS

## 2015-04-14 MED ORDER — INSULIN ASPART 100 UNIT/ML ~~LOC~~ SOLN
3.0000 [IU] | Freq: Three times a day (TID) | SUBCUTANEOUS | Status: DC
  Administered 2015-04-14 – 2015-04-15 (×3): 3 [IU] via SUBCUTANEOUS

## 2015-04-14 MED ORDER — VANCOMYCIN HCL 10 G IV SOLR
1750.0000 mg | Freq: Two times a day (BID) | INTRAVENOUS | Status: DC
  Administered 2015-04-14 – 2015-04-15 (×3): 1750 mg via INTRAVENOUS
  Filled 2015-04-14 (×4): qty 1750

## 2015-04-14 MED ORDER — AMLODIPINE BESYLATE 10 MG PO TABS
10.0000 mg | ORAL_TABLET | Freq: Every day | ORAL | Status: DC
Start: 2015-04-14 — End: 2015-04-14

## 2015-04-14 MED ORDER — AMLODIPINE BESYLATE 5 MG PO TABS
5.0000 mg | ORAL_TABLET | Freq: Every day | ORAL | Status: DC
  Administered 2015-04-14: 5 mg via ORAL
  Filled 2015-04-14: qty 1

## 2015-04-14 MED ORDER — INSULIN GLARGINE 100 UNIT/ML ~~LOC~~ SOLN
20.0000 [IU] | Freq: Every day | SUBCUTANEOUS | Status: DC
  Administered 2015-04-15: 20 [IU] via SUBCUTANEOUS
  Filled 2015-04-14 (×4): qty 0.2

## 2015-04-14 MED ORDER — MAGNESIUM SULFATE 50 % IJ SOLN
3.0000 g | Freq: Once | INTRAVENOUS | Status: AC
  Administered 2015-04-14: 3 g via INTRAVENOUS
  Filled 2015-04-14: qty 6

## 2015-04-14 MED ORDER — POTASSIUM CHLORIDE CRYS ER 20 MEQ PO TBCR
40.0000 meq | EXTENDED_RELEASE_TABLET | Freq: Once | ORAL | Status: AC
  Administered 2015-04-14: 40 meq via ORAL
  Filled 2015-04-14: qty 2

## 2015-04-14 NOTE — Progress Notes (Signed)
Discussed plan of care with dtr, Janette, at length.  Talked with IV team regarding PICC line placement.  Most likely, it will be tomorrow morning before the line is placed due to emergencies.

## 2015-04-14 NOTE — Progress Notes (Signed)
Called and left a message for son Letitia Libra to call me at hospital.  Called and s/w Janette, dtr. Regarding pt wanting to leave AMA against recommendation of Dr. Grandville Silos who spoke with him earlier.  She is coming over to the hospital at present.

## 2015-04-14 NOTE — Clinical Social Work Note (Cosign Needed)
Clinical Social Work Assessment  Patient Details  Name: Andrew [name] MRN: [id number] Date of Birth: May 15, 1946  Date of referral:  04/14/15               Reason for consult:  Facility Placement, Discharge Planning                Permission sought to share information with:  Case Manager, Family Supports, Customer service manager Permission granted to share information::  Yes, Verbal Permission Granted  Name::      ([place] and Donnie )  Agency::   (None )  Relationship::   (Daughter and Son)  Sport and exercise psychologist Information:   [phone])  Housing/Transportation Living arrangements for the past 2 months:  Single Family Home Source of Information:  Patient Patient Interpreter Needed:  None Criminal Activity/Legal Involvement Pertinent to Current Situation/Hospitalization:  No - Comment as needed Significant Relationships:  Adult Children Lives with:  Self Do you feel safe going back to the place where you live?  Yes Need for family participation in patient care:  Yes (Comment)  Care giving concerns:  Patient lives alone at home.    Social Worker assessment / plan:  Holiday representative met with patient at bedside in reference to post-acute placement for SNF. CSW introduced CSW role and SNF process. CSW also reviewed and provided SNF list. Patient stated he wants to return home as he has responsibilities to care for at home. Patient currently refusing SNF placement. CSW asked if patient had family to handle his responsibilities at home while his away at short-term rehab. Patient stated his wife is deceased and he cannot depend on his son because he works often. Pt is NOT agreeable to SNF search and insist that he returns home. CSW noted patient attempting to leave AMA. RN has contacted family. No further concerns reported by patient at this time. CSW to contact pt's dtr and or son and will continue to follow patient for disposition.   Employment status:  Retired Designer, industrial/product PT Recommendations:  [place] / Referral to community resources:  [place]  Patient/Family's Response to care:  Pt lying to bed alert and oriented x4. Pt refusing SNF placement and stated he will return home alone. Pt respectful during assessment and appreciated visit from [place].   Patient/Family's Understanding of and Emotional Response to Diagnosis, Current Treatment, and Prognosis: Patient refusing recommended rehab placement. Pt acknowledged he will be home alone however still insisting that returns home TODAY.   Emotional Assessment Appearance:  Appears younger than stated age Attitude/Demeanor/Rapport:  Apprehensive Affect (typically observed):  Pleasant, Blunt, Apprehensive Orientation:  Oriented to Self, Oriented to Place, Oriented to  Time, Oriented to Situation Alcohol / Substance use:  Not Applicable Psych involvement (Current and /or in the community):  No (Comment)  Discharge Needs  Concerns to be addressed:  Denies Needs/Concerns at this time, Patient refuses services Readmission within the last 30 days:  No Current discharge risk:  Dependent with Mobility, Lives alone Barriers to Discharge:  Continued Medical Work up  [employer], MSW, LCSWA [phone] 04/14/2015 2:39 PM

## 2015-04-14 NOTE — Progress Notes (Signed)
ANTIBIOTIC CONSULT NOTE - Follow-up  Pharmacy Consult for vancomycin Indication: Bacteremia  Allergies  Allergen Reactions  . Penicillins Rash and Other (See Comments)    Possibly rash?    Patient Measurements: Height: 5\' 6"  (167.6 cm) Weight: 184 lb 11.9 oz (83.8 kg) IBW/kg (Calculated) : 63.8  Vital Signs: Temp: 97.7 F (36.5 C) (08/28 0656) Temp Source: Oral (08/28 0656) BP: 166/70 mmHg (08/28 0656) Pulse Rate: 70 (08/28 0656) Intake/Output from previous day: 08/27 0701 - 08/28 0700 In: 990 [P.O.:440; IV Piggyback:550] Out: 350 [Urine:350] Intake/Output from this shift: Total I/O In: 240 [P.O.:240] Out: -   Labs:  Recent Labs  04/12/15 0341 04/13/15 0252 04/14/15 0405  WBC 11.8* 8.9 9.1  HGB 14.9 13.4 15.0  PLT 208 218 274  CREATININE 0.50* 0.49* 0.51*   Estimated Creatinine Clearance: 88.5 mL/min (by C-G formula based on Cr of 0.51).  Recent Labs  04/12/15 0925 04/14/15 1205  [place] 8* 13     Microbiology: Recent Results (from the past 720 hour(s))  Culture, blood (routine x 2)     Status: None   Collection Time: 04/08/15  8:15 PM  Result Value Ref Range Status   Specimen Description BLOOD RIGHT ARM  Final   Special Requests BOTTLES DRAWN AEROBIC AND ANAEROBIC 10ML  Final   Culture  Setup Time   Final    GRAM POSITIVE COCCI IN CLUSTERS IN BOTH AEROBIC AND ANAEROBIC BOTTLES CRITICAL RESULT CALLED TO, READ BACK BY AND VERIFIED WITH: J. BOZEMAN,RN AT 0848 ON [id number] BY Rhea Bleacher    Culture METHICILLIN RESISTANT STAPHYLOCOCCUS AUREUS  Final   Report Status 04/11/2015 FINAL  Final   Organism ID, Bacteria METHICILLIN RESISTANT STAPHYLOCOCCUS AUREUS  Final      Susceptibility   Methicillin resistant staphylococcus aureus - MIC*    CIPROFLOXACIN <=0.5 SENSITIVE Sensitive     ERYTHROMYCIN >=8 RESISTANT Resistant     GENTAMICIN <=0.5 SENSITIVE Sensitive     OXACILLIN >=4 RESISTANT Resistant     TETRACYCLINE <=1 SENSITIVE Sensitive    VANCOMYCIN 1 SENSITIVE Sensitive     TRIMETH/SULFA <=10 SENSITIVE Sensitive     CLINDAMYCIN <=0.25 SENSITIVE Sensitive     RIFAMPIN <=0.5 SENSITIVE Sensitive     Inducible Clindamycin NEGATIVE Sensitive     * METHICILLIN RESISTANT STAPHYLOCOCCUS AUREUS  Culture, blood (routine x 2)     Status: None   Collection Time: 04/08/15  8:25 PM  Result Value Ref Range Status   Specimen Description BLOOD RIGHT FOREARM  Final   Special Requests BOTTLES DRAWN AEROBIC AND ANAEROBIC 10ML  Final   Culture  Setup Time   Final    GRAM POSITIVE COCCI IN CLUSTERS IN BOTH AEROBIC AND ANAEROBIC BOTTLES CRITICAL RESULT CALLED TO, READ BACK BY AND VERIFIED WITH: J BOZEMAN 04/09/15 @ 0949 M VESTAL    Culture   Final    STAPHYLOCOCCUS AUREUS SUSCEPTIBILITIES PERFORMED ON PREVIOUS CULTURE WITHIN THE LAST 5 DAYS.    Report Status 04/11/2015 FINAL  Final  Urine culture     Status: None   Collection Time: 04/08/15  9:20 PM  Result Value Ref Range Status   Specimen Description URINE, CLEAN CATCH  Final   Special Requests NONE  Final   Culture MULTIPLE SPECIES PRESENT, SUGGEST RECOLLECTION  Final   Report Status 04/10/2015 FINAL  Final  Culture, blood (routine x 2)     Status: None (Preliminary result)   Collection Time: 04/10/15  1:20 PM  Result Value Ref Range Status  Specimen Description BLOOD RIGHT ARM  Final   Special Requests BOTTLES DRAWN AEROBIC ONLY 5CC  Final   Culture NO GROWTH 3 DAYS  Final   Report Status PENDING  Incomplete  Culture, blood (routine x 2)     Status: None (Preliminary result)   Collection Time: 04/10/15  1:30 PM  Result Value Ref Range Status   Specimen Description BLOOD RIGHT HAND  Final   Special Requests IN PEDIATRIC BOTTLE 3CC  Final   Culture NO GROWTH 3 DAYS  Final   Report Status PENDING  Incomplete   Assessment: 69 yo M presents on 8/22 with R leg pain and DM. Levaquin was started yesterday for acute bronchitis and low grade fever. Started on vancomycin for MRSA  bacteremia. Pt is afebrile and WBC is 11.8. Scr is down to 0.5. A trough today remains low at 13.    8/22 Blood cx > 2/2 MRSA 8/22 Urine cx > multiple species 8/24 Blood cx > NGTD  8/23 Vancomycin >> 8/23 Levaquin >> 8/24  8/26 VT = 8 on 1gm IV Q12H 8/28 VT = 13 on 1500mg  IV Q12H  Goal of Therapy:  Vancomycin trough level 15-20 mcg/ml  Resolution of infection  Plan:  - Change vancomycin to 1750mg  IV Q12H - will need to check trough right away at steady state, trying to avoid Q8H dosing with a prolonged course of vancomycin anticipated - F/u renal fxn, C&S, clinical status and trough at [facility], PharmD, BCPS Pager # [phone] 04/14/2015 1:29 PM

## 2015-04-14 NOTE — Progress Notes (Signed)
[facility] PROGRESS NOTE  Andrew [name] UTM:[id number] DOB: 08/05/1946 DOA: 04/08/2015 PCP: No primary care provider on file.  Brief history 69 year old male with diabetes mellitus, hypertension, hyperlipidemia presents with 3 day history of generalized weakness, bilateral lower extremity pain and weakness, lethargy and somnolence, and polyuria. The patient lives with his son, but unfortunately he and his son both drink on a daily basis. The patient's daughter provides supplemental history, and she states that he has been drinking daily for at least the last 3 years. She is not clear exactly how much he drinks but his primary alcohol is beer. Currently, the patient is encephalopathic and unable to provide any history. Upon presentation, the patient was noted to have serum glucose 424 with anion gap of 20. The patient was started on intravenous fluids with improvement of his anion gap and serum glucose. Blood cultures were obtained and are growing MRSA.  TEE has been recommended per ID however patient concern about finances and currently refusing TEE.  Assessment/Plan: #1 MRSA  bacteremia Blood cultures from 04/08/2015 positive for MRSA. Patient has been seen in consultation by infectious diseases and recommend continue IV vancomycin. IV Levaquin has been discontinued. 2-D echo has been done with a EF of 65-70%. No wall motion abnormalities. Mild aortic valvular stenosis. Grade 1 diastolic dysfunction. No signs of vegetations. Patient still with complaints of right thigh pain. Plain films of the right thigh was negative. CT of the right hip and right thigh show nonspecific fasciitis/very early soft tissue infection. Continue IV vancomycin. ID recommended TEE early next week to rule out vegetations. Cardiology was asked to assess patient for TEE however patient kept repeating to cardiology PA that he was on a low income and cannot afford any more tests and as such cardiology will hold off until  patient is in agreement. Spoke with patient concerning TEE and need for it to rule out vegetations however patient is concerned about his finances and patient insistent on being discharged home today in order to take care of some business. Severity of patient's illness was explained to the patient and told that he was not medically stable at this time to be discharged. It was explained to patient that if he wanted to leave he would have to leave [place]. Patient's neighbor witnessed discussion and was trying to speak with patient. Repeat blood cultures from 04/10/2015 are currently remaining negative. Will place an order for PICC line. Patient will likely need at least 6-8 weeks of IV antibiotics. ID following.  #2 acute encephalopathy Likely multifactorial secondary to dehydration, history of alcohol use, bacteremia and DKA. Patient is more alert today interacting and following commands and answering questions appropriately. CT head negative. B-12 levels within normal limits. TSH within normal limits. Ammonia level at 19. Patient has been seen by speech therapy and diet has been advanced. Patient currently tolerating a solid diet. Continue empiric IV vancomycin. Follow.  #3 diabetic ketoacidosis/Diabetes mellitus type II uncontrolled Likely triggered by problem #1(MRSA bacteremia ).Anion gap closed and patient has been transitioned to subcutaneous insulin. Hemoglobin A1c13.8. CBGs have ranged from 167-259. Continue Lantus 16 units daily. Sliding scale insulin. Will add meal coverage insulin. Continue carb modified diet.  #4 right leg pain/weakness B-12 levels within normal limits. TSH within normal limits. MRI of the T and L-spine with no source of infection. No definite cord pathology or acute fracture noted on MRI of the T-spine. MRI of the L-spine with no acute fracture or malalignment. Mild canal stenosis  at L3-4. Neural foraminal narrowing L2-3 through L5-S1. Moderate to severe on the  right at L5-S1. Tiny) right central L5-S1 disc extrusion. Plain films of the right thigh were negative. CT scan of the right thigh with and nonspecific fasciitis involving the extensor compartment of the right thigh may be secondary to an infectious or inflammatory etiology. No abscess noted. Continue empiric IV antibiotics. Follow.   #5 hyponatremia/hypokalemia/hypomagnesemia Likely secondary to problem #3. Hyponatremia has improved. Replete potassium and magnesium. Keep magnesium greater than 2.  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 Patient hasn't seen in consultation by vascular surgery, Dr. Donnetta Hutching was recommending outpatient follow-up with repeat ultrasound in 6 months. Vascular surgery recommending that if aneurysm reached 5.5-6 cm would consider elective repair. Appreciate vascular surgeries input and recommendations.   #7 ETOH dependence Continue the alcohol withdrawal protocol. Scheduled Ativan has been discontinued secondary to patient's increased lethargy. Patient is more alert. Patient with no signs of DTs at this time. Continue when necessary Ativan. Continue thiamine and folic acid.  #8 hypertension Continue oral Lopressor. Increase Norvasc to 10 mg daily.   #9 prophylaxis Lovenox for DVT prophylaxis.   Code Status: Full Family Communication: Updated patient and neighbor at bedside. Disposition Plan: Likely needs skilled nursing facility versus home health. Patient asking to leave today however he's been advised that he is not medically ready to leave.   Consultants:  ID: Dr. Megan Salon 04/10/2015  Vascular surgery: Dr. Donnetta Hutching 04/13/2015  Procedures:  Plain films right femur 04/08/2015  2-D echo 04/09/2015,   MRI T and L spine 04/09/2015  Chest x-ray 04/08/2015  CT head 04/10/2015  Antibiotics:  IV Vancomycin 04/09/2015  IV Levaquin 04/09/2015>>>>> 04/09/2015  HPI/Subjective: Patient alert and following commands. Patient denies any chest pain.  No shortness of breath. No abdominal pain. Patient states he is ready to leave today. Patient has been advised that is not medically stable at this time to leave.  Objective: Filed Vitals:   04/14/15 1255  BP: 175/81  Pulse: 72  Temp: 97.7 F (36.5 C)  Resp: 18    Intake/Output Summary (Last 24 hours) at 04/14/15 1543 Last data filed at 04/14/15 1403  Gross per 24 hour  Intake   2290 ml  Output    350 ml  Net   1940 ml   Filed Weights   04/09/15 0242 04/10/15 0528  Weight: 82.7 kg (182 lb 5.1 oz) 83.8 kg (184 lb 11.9 oz)    Exam:   General:  NAD. Alert.  Cardiovascular: RRR  Respiratory: CTAB  Abdomen: Soft/NT/ND/+BS  Musculoskeletal: No c/c/e.   Data Reviewed: Basic Metabolic Panel:  Recent Labs Lab 04/09/15 0502  04/10/15 0324 04/11/15 0404 04/12/15 0341 04/13/15 0252 04/14/15 0405  NA 130*  < > 133* 135 135 136 137  K 4.0  < > 3.6 3.1* 3.1* 3.4* 3.4*  CL 97*  < > 100* 102 100* 102 99*  CO2 19*  < > 17* 24 24 27 28   GLUCOSE 322*  < > 297* 142* 142* 183* 202*  BUN 16  < > 13 7 <5* 7 <5*  CREATININE 1.04  < > 1.03 0.52* 0.50* 0.49* 0.51*  CALCIUM 8.6*  < > 8.5* 8.3* 8.3* 8.2* 8.7*  MG 1.5*  --   --   --  1.2* 1.8 1.7  PHOS 3.2  --   --   --   --   --   --   < > = values in this interval  not displayed. Liver Function Tests:  Recent Labs Lab 04/08/15 2049 04/09/15 0502 04/11/15 0404  AST 31 25 41  ALT 13* 13* 24  ALKPHOS 75 72 94  BILITOT 0.9 1.0 0.7  PROT 6.0* 5.4* 5.1*  ALBUMIN 3.0* 2.7* 2.2*    Recent Labs Lab 04/08/15 2049  LIPASE 12*    Recent Labs Lab 04/08/15 2046  AMMONIA 19   CBC:  Recent Labs Lab 04/10/15 0324 04/11/15 0404 04/12/15 0341 04/13/15 0252 04/14/15 0405  WBC 12.7* 11.6* 11.8* 8.9 9.1  HGB 14.9 14.2 14.9 13.4 15.0  HCT 42.3 40.3 41.2 38.0* 43.3  MCV 88.5 87.0 86.6 86.8 88.9  PLT 191 165 208 218 274   Cardiac Enzymes:  Recent Labs Lab 04/09/15 1555  CKTOTAL 338   BNP (last 3 results) No  results for input(s): BNP in the last 8760 hours.  ProBNP (last 3 results) No results for input(s): PROBNP in the last 8760 hours.  CBG:  Recent Labs Lab 04/13/15 1205 04/13/15 1532 04/13/15 2053 04/14/15 0735 04/14/15 1207  GLUCAP 274* 204* 208* 167* 259*    Recent Results (from the past 240 hour(s))  Culture, blood (routine x 2)     Status: None   Collection Time: 04/08/15  8:15 PM  Result Value Ref Range Status   Specimen Description BLOOD RIGHT ARM  Final   Special Requests BOTTLES DRAWN AEROBIC AND ANAEROBIC 10ML  Final   Culture  Setup Time   Final    GRAM POSITIVE COCCI IN CLUSTERS IN BOTH AEROBIC AND ANAEROBIC BOTTLES CRITICAL RESULT CALLED TO, READ BACK BY AND VERIFIED WITH: J. BOZEMAN,RN AT 0848 ON [id number] BY Rhea Bleacher    Culture METHICILLIN RESISTANT STAPHYLOCOCCUS AUREUS  Final   Report Status 04/11/2015 FINAL  Final   Organism ID, Bacteria METHICILLIN RESISTANT STAPHYLOCOCCUS AUREUS  Final      Susceptibility   Methicillin resistant staphylococcus aureus - MIC*    CIPROFLOXACIN <=0.5 SENSITIVE Sensitive     ERYTHROMYCIN >=8 RESISTANT Resistant     GENTAMICIN <=0.5 SENSITIVE Sensitive     OXACILLIN >=4 RESISTANT Resistant     TETRACYCLINE <=1 SENSITIVE Sensitive     VANCOMYCIN 1 SENSITIVE Sensitive     TRIMETH/SULFA <=10 SENSITIVE Sensitive     CLINDAMYCIN <=0.25 SENSITIVE Sensitive     RIFAMPIN <=0.5 SENSITIVE Sensitive     Inducible Clindamycin NEGATIVE Sensitive     * METHICILLIN RESISTANT STAPHYLOCOCCUS AUREUS  Culture, blood (routine x 2)     Status: None   Collection Time: 04/08/15  8:25 PM  Result Value Ref Range Status   Specimen Description BLOOD RIGHT FOREARM  Final   Special Requests BOTTLES DRAWN AEROBIC AND ANAEROBIC 10ML  Final   Culture  Setup Time   Final    GRAM POSITIVE COCCI IN CLUSTERS IN BOTH AEROBIC AND ANAEROBIC BOTTLES CRITICAL RESULT CALLED TO, READ BACK BY AND VERIFIED WITH: J BOZEMAN 04/09/15 @ 0949 M VESTAL    Culture    Final    STAPHYLOCOCCUS AUREUS SUSCEPTIBILITIES PERFORMED ON PREVIOUS CULTURE WITHIN THE LAST 5 DAYS.    Report Status 04/11/2015 FINAL  Final  Urine culture     Status: None   Collection Time: 04/08/15  9:20 PM  Result Value Ref Range Status   Specimen Description URINE, CLEAN CATCH  Final   Special Requests NONE  Final   Culture MULTIPLE SPECIES PRESENT, SUGGEST RECOLLECTION  Final   Report Status 04/10/2015 FINAL  Final  Culture, blood (routine x 2)  Status: None (Preliminary result)   Collection Time: 04/10/15  1:20 PM  Result Value Ref Range Status   Specimen Description BLOOD RIGHT ARM  Final   Special Requests BOTTLES DRAWN AEROBIC ONLY 5CC  Final   Culture NO GROWTH 3 DAYS  Final   Report Status PENDING  Incomplete  Culture, blood (routine x 2)     Status: None (Preliminary result)   Collection Time: 04/10/15  1:30 PM  Result Value Ref Range Status   Specimen Description BLOOD RIGHT HAND  Final   Special Requests IN PEDIATRIC BOTTLE 3CC  Final   Culture NO GROWTH 3 DAYS  Final   Report Status PENDING  Incomplete     Studies: No results found.  Scheduled Meds: . amLODipine  5 mg Oral Daily  . antiseptic oral rinse  7 mL Mouth Rinse q12n4p  . aspirin EC  81 mg Oral Daily  . chlorhexidine  15 mL Mouth Rinse BID  . enoxaparin (LOVENOX) injection  40 mg Subcutaneous Daily  . famotidine  20 mg Oral BID  . insulin aspart  0-5 Units Subcutaneous QHS  . insulin aspart  0-9 Units Subcutaneous Q4H  . insulin aspart  3 Units Subcutaneous TID WC  . insulin glargine  16 Units Subcutaneous QHS  . metoprolol tartrate  25 mg Oral BID  . multivitamin with minerals  1 tablet Oral Daily  . thiamine  100 mg Oral Daily  . vancomycin  1,750 mg Intravenous Q12H   Continuous Infusions:    Principal Problem:   Bacteremia due to methicillin resistant Staphylococcus aureus Active Problems:   Hypertension   Hyperlipidemia   Alcohol abuse   Tobacco abuse disorder   Acute  encephalopathy   Diabetic ketoacidosis without coma associated with type 2 diabetes mellitus   Leg weakness   Atherosclerotic peripheral vascular disease   Acute pain of right thigh   Right thigh pain   Hypokalemia   Saccular aneurysm: 4.8cm infrarenal AAA per MRI (04/09/2015)   Hypomagnesemia   Essential hypertension    Time spent: 56 mins    [facility] MD [facility] Pager [phone]. If 7PM-7AM, please contact night-coverage at [email or web address], password [facility] 04/14/2015, 3:43 PM  LOS: 6 days

## 2015-04-15 ENCOUNTER — Encounter (HOSPITAL_COMMUNITY)
Admission: EM | Disposition: A | Discharge status: Home Health | Payer: Self-pay | Source: Home / Self Care | Attending: Internal Medicine

## 2015-04-15 LAB — GLUCOSE, CAPILLARY
GLUCOSE-CAPILLARY: 202 mg/dL — AB (ref 65–99)
Glucose-Capillary: 182 mg/dL — ABNORMAL HIGH (ref 65–99)
Glucose-Capillary: 217 mg/dL — ABNORMAL HIGH (ref 65–99)

## 2015-04-15 LAB — CBC
HEMATOCRIT: 40.2 % (ref 39.0–52.0)
HEMOGLOBIN: 13.8 g/dL (ref 13.0–17.0)
MCH: 30.3 pg (ref 26.0–34.0)
MCHC: 34.3 g/dL (ref 30.0–36.0)
MCV: 88.2 fL (ref 78.0–100.0)
Platelets: 352 10*3/uL (ref 150–400)
RBC: 4.56 MIL/uL (ref 4.22–5.81)
RDW: 13.3 % (ref 11.5–15.5)
WBC: 8.3 10*3/uL (ref 4.0–10.5)

## 2015-04-15 LAB — BASIC METABOLIC PANEL
Anion gap: 11 (ref 5–15)
BUN: 7 mg/dL (ref 6–20)
CHLORIDE: 97 mmol/L — AB (ref 101–111)
CO2: 27 mmol/L (ref 22–32)
CREATININE: 0.56 mg/dL — AB (ref 0.61–1.24)
Calcium: 8.6 mg/dL — ABNORMAL LOW (ref 8.9–10.3)
GFR calc non Af Amer: >60 mL/min (ref >60)
GLUCOSE: 261 mg/dL — AB (ref 65–99)
Potassium: 3.5 mmol/L (ref 3.5–5.1)
Sodium: 135 mmol/L (ref 135–145)

## 2015-04-15 LAB — CULTURE, BLOOD (ROUTINE X 2)
CULTURE: NO GROWTH
Culture: NO GROWTH

## 2015-04-15 LAB — MAGNESIUM: Magnesium: 1.7 mg/dL (ref 1.7–2.4)

## 2015-04-15 SURGERY — ECHOCARDIOGRAM, TRANSESOPHAGEAL
Anesthesia: Moderate Sedation

## 2015-04-15 MED ORDER — METOPROLOL TARTRATE 50 MG PO TABS
50.0000 mg | ORAL_TABLET | Freq: Two times a day (BID) | ORAL | Status: DC
  Administered 2015-04-15: 50 mg via ORAL
  Filled 2015-04-15: qty 1

## 2015-04-15 MED ORDER — HYDRALAZINE HCL 20 MG/ML IJ SOLN: 10.0000 mg | Freq: Four times a day (QID) | INTRAMUSCULAR | Status: DC | PRN

## 2015-04-15 MED ORDER — SODIUM CHLORIDE 0.9 % IV SOLN: 1750.0000 mg | Freq: Two times a day (BID) | INTRAVENOUS | Status: AC

## 2015-04-15 MED ORDER — TRAMADOL HCL 50 MG PO TABS: 50.0000 mg | ORAL_TABLET | Freq: Four times a day (QID) | ORAL | Status: DC | PRN

## 2015-04-15 MED ORDER — AMLODIPINE BESYLATE 10 MG PO TABS: 10.0000 mg | ORAL_TABLET | Freq: Every day | ORAL | Status: DC

## 2015-04-15 MED ORDER — AMLODIPINE BESYLATE 10 MG PO TABS
10.0000 mg | ORAL_TABLET | Freq: Every day | ORAL | Status: DC
  Administered 2015-04-15: 10 mg via ORAL
  Filled 2015-04-15: qty 1

## 2015-04-15 MED ORDER — INSULIN STARTER KIT- SYRINGES (ENGLISH)
1.0000 | Freq: Once | Status: DC
  Filled 2015-04-15: qty 1

## 2015-04-15 MED ORDER — METOPROLOL SUCCINATE ER 100 MG PO TB24: 100.0000 mg | ORAL_TABLET | Freq: Every day | ORAL | Status: DC

## 2015-04-15 MED ORDER — ADULT MULTIVITAMIN W/MINERALS CH: 1.0000 | ORAL_TABLET | Freq: Every day | ORAL | Status: DC

## 2015-04-15 MED ORDER — THIAMINE HCL 100 MG PO TABS: 100.0000 mg | ORAL_TABLET | Freq: Every day | ORAL | Status: DC

## 2015-04-15 MED ORDER — HEPARIN SOD (PORK) LOCK FLUSH 100 UNIT/ML IV SOLN
250.0000 [IU] | INTRAVENOUS | Status: AC | PRN
  Administered 2015-04-15: 250 [IU]

## 2015-04-15 MED ORDER — INSULIN ASPART PROT & ASPART (70-30 MIX) 100 UNIT/ML ~~LOC~~ SUSP
20.0000 [IU] | Freq: Two times a day (BID) | SUBCUTANEOUS | Status: DC
  Administered 2015-04-15: 20 [IU] via SUBCUTANEOUS
  Filled 2015-04-15: qty 10

## 2015-04-15 MED ORDER — MAGNESIUM SULFATE 50 % IJ SOLN
3.0000 g | Freq: Once | INTRAVENOUS | Status: AC
  Administered 2015-04-15: 3 g via INTRAVENOUS
  Filled 2015-04-15: qty 6

## 2015-04-15 NOTE — Progress Notes (Addendum)
Inpatient Diabetes Program Recommendations  AACE/ADA: New Consensus Statement on Inpatient Glycemic Control (2013)  Target Ranges:  Prepandial:   less than 140 mg/dL      Peak postprandial:   less than 180 mg/dL (1-2 hours)      Critically ill patients:  140 - 180 mg/dL   Results for Andrew [name], Andrew [name] (MRN [id number]) as of 04/15/2015 13:48  Ref. Range 04/14/2015 07:35 04/14/2015 12:07 04/14/2015 17:11 04/14/2015 21:08  Glucose-Capillary Latest Ref Range: 65-99 mg/dL 167 (H) 259 (H) 250 (H) 257 (H)    Results for Andrew [name], Andrew [name] (MRN [id number]) as of 04/15/2015 13:48  Ref. Range 04/15/2015 07:28 04/15/2015 13:01  Glucose-Capillary Latest Ref Range: 65-99 mg/dL 202 (H) 182 (H)    Diabetes history: Type 2 diabetes  Home DM Meds: Januvia 100 mg daily       Metformin 1000 mg bid  Current Insulin Orders: Lantus 20 units QHS          Novolog Sensitive SSI (0-9 units) TID AC + HS      Novolog 3 units tidwc     -Note that patient was on Metformin at home prior to admit, however due to history of ETOH abuse, may consider not restarting Metformin at discharge.  -Note also patient requiring a fair amount of insulin here in the hospital.  Likely needs insulin at home, however, I am unsure he will be able to provide insulin injections to himself at home.    [facility] with patient about using insulin at home.  Patient told me he has taken insulin before but could not tell me what kind or how much.  I placed a vial of saline and an insulin syringe in front of patient and asked him to please demonstrate to me that he was comfortable drawing up and giving insulin.  Patient looked at me and told me he wasn't "gonna do it" and that his daughter would have to give him his insulin at home.  Patient then went on to tell me that he wants to go home today and that he is "gonna leave" if the doctor does not let him go home.  "That doctor has had 7 days to fix me!".  -Have asked RNs caring for patient to provide  insulin education to patient's daughter and/or son when they arrive.  Not sure patient will be able or willing to give himself insulin at home.    MD- Not sure we should d/c patient home on a complicated insulin regimen.  Patient does not seem willing to learn how to give insulin to himself and I don't know how available patient's son and daughter will be to provide 4 shots of insulin per day to patient (Lantus and Novolog).    Perhaps we should consider switching patient to bid dosing of 70/30 insulin?  We could go ahead and convert patient to 70/30 insulin here in the hospital and send him home on that regimen instead.  Would start with 70/30 insulin- 20 units bid with meals (start tonight at supper)     Will follow Wyn Quaker RN, MSN, CDE Diabetes Coordinator Inpatient Glycemic Control Team Team Pager: [phone] (8a-5p)

## 2015-04-15 NOTE — Care Management Note (Signed)
Case Management Note  Patient Details  Name: Andrew [name] MRN: [id number] Date of Birth: 1945/12/20  Subjective/Objective:                    Action/Plan:  Confirmed face sheet information with patient .  Explained PT/OT recommending short term SNF placement  And he may need PICC and 6 to 8 weeks of IV antibiotics , patient states he is aware but refusing SNF .   Explained home health will teach him how to adm IV antibiotics [facility] will not be there to give every dose) , patient states he will be able to adm IV ABX once shown.   Patient gave permission for NCM to talk with his daughter Lou Cal [phone] . Spoke with Ms Constance Goltz . She is aware patient refusing SNF and plan to discharge to home with 6 to 8 weeks IV antibiotics . Ms Constance Goltz states her brother lives with patient and is able to provide 24 assistance and assist with IV antibiotics .   Referral given to [facility] with [place] .  Will need home health orders.   Magdalen Spatz RN BSN [phone]  Expected Discharge Date:                Expected Discharge Plan:  [place]  In-House Referral:     Discharge planning Services  CM Consult  Post Acute Care Choice:  Home Health Choice offered to:   patient   DME Arranged:    DME Agency:     HH Arranged:  RN [place] Agency:  [facility]  Status of Service:  In process, will continue to follow  Medicare Important Message Given:  Yes-second notification given Date Medicare IM Given:    Medicare IM give by:    Date Additional Medicare IM Given:    Additional Medicare Important Message give by:     If discussed at [place] of Stay Meetings, dates discussed:    Additional Comments:  Marilu Favre, RN 04/15/2015, 8:56 AM

## 2015-04-15 NOTE — Discharge Summary (Signed)
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 BOTH AEROBIC AND ANAEROBIC BOTTLES CRITICAL RESULT CALLED TO, READ BACK BY AND VERIFIED WITH: J BOZEMAN 04/09/15 @ 0949 M VESTAL    Culture   Final    STAPHYLOCOCCUS AUREUS SUSCEPTIBILITIES PERFORMED ON PREVIOUS CULTURE WITHIN THE LAST 5 DAYS.    Report Status 04/11/2015 FINAL  Final  Urine culture     Status: None   Collection Time: 04/08/15  9:20 PM  Result Value Ref Range Status   Specimen Description URINE, CLEAN CATCH  Final   Special Requests NONE  Final   Culture MULTIPLE SPECIES PRESENT, SUGGEST RECOLLECTION  Final   Report Status 04/10/2015 FINAL  Final  Culture, blood (routine x 2)     Status: None   Collection Time: 04/10/15  1:20 PM  Result Value Ref Range Status   Specimen Description BLOOD RIGHT ARM  Final   Special Requests BOTTLES DRAWN AEROBIC ONLY 5CC  Final   Culture NO GROWTH 5 DAYS  Final   Report Status 04/15/2015 FINAL  Final  Culture, blood (routine x 2)     Status: None   Collection Time: 04/10/15  1:30 PM  Result Value Ref Range Status   Specimen Description BLOOD RIGHT HAND  Final   Special  Requests IN PEDIATRIC BOTTLE 3CC  Final   Culture NO GROWTH 5 DAYS  Final   Report Status 04/15/2015 FINAL  Final     Labs: Basic Metabolic Panel:  Recent Labs Lab 04/09/15 0502  04/11/15 0404 04/12/15 0341 04/13/15 0252 04/14/15 0405 04/15/15 0240  NA  130*  < > 135 135 136 137 135  K 4.0  < > 3.1* 3.1* 3.4* 3.4* 3.5  CL 97*  < > 102 100* 102 99* 97*  CO2 19*  < > 24 24 27 28 27   GLUCOSE 322*  < > 142* 142* 183* 202* 261*  BUN 16  < > 7 <5* 7 <5* 7  CREATININE 1.04  < > 0.52* 0.50* 0.49* 0.51* 0.56*  CALCIUM 8.6*  < > 8.3* 8.3* 8.2* 8.7* 8.6*  MG 1.5*  --   --  1.2* 1.8 1.7 1.7  PHOS 3.2  --   --   --   --   --   --   < > = values in this interval not displayed. Liver Function Tests:  Recent Labs Lab 04/08/15 2049 04/09/15 0502 04/11/15 0404  AST 31 25 41  ALT 13* 13* 24  ALKPHOS 75 72 94  BILITOT 0.9 1.0 0.7  PROT 6.0* 5.4* 5.1*  ALBUMIN 3.0* 2.7* 2.2*    Recent Labs Lab 04/08/15 2049  LIPASE 12*    Recent Labs Lab 04/08/15 2046  AMMONIA 19   CBC:  Recent Labs Lab 04/11/15 0404 04/12/15 0341 04/13/15 0252 04/14/15 0405 04/15/15 0240  WBC 11.6* 11.8* 8.9 9.1 8.3  HGB 14.2 14.9 13.4 15.0 13.8  HCT 40.3 41.2 38.0* 43.3 40.2  MCV 87.0 86.6 86.8 88.9 88.2  PLT 165 208 218 274 352   Cardiac Enzymes:  Recent Labs Lab 04/09/15 1555  CKTOTAL 338   BNP: BNP (last 3 results) No results for input(s): BNP in the last 8760 hours.  ProBNP (last 3 results) No results for input(s): PROBNP in the last 8760 hours.  CBG:  Recent Labs Lab 04/14/15 1207 04/14/15 1711 04/14/15 2108 04/15/15 0728 04/15/15 1301  GLUCAP 259* 250* 257* 202* 182*       Signed:  Vanisha Whiten MD [facility] 04/15/2015, 4:05 PM

## 2015-04-15 NOTE — Progress Notes (Signed)
Peripherally Inserted Central Catheter/Midline Placement  The IV Nurse has discussed with the patient and/or persons authorized to consent for the patient, the purpose of this procedure and the potential benefits and risks involved with this procedure.  The benefits include less needle sticks, lab draws from the catheter and patient may be discharged home with the catheter.  Risks include, but not limited to, infection, bleeding, blood clot (thrombus formation), and puncture of an artery; nerve damage and irregular heat beat.  Alternatives to this procedure were also discussed.  PICC/Midline Placement Documentation        Maveryck Bahri, Nicolette Bang 04/15/2015, 1:54 PM

## 2015-04-15 NOTE — Progress Notes (Signed)
Patient ID: Andrew [name], male   DOB: [birth year], 69 y.o.   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 amLODipine  10 mg Oral Daily  . antiseptic oral rinse  7 mL Mouth Rinse q12n4p  . aspirin EC  81 mg Oral Daily  . enoxaparin (LOVENOX) injection  40 mg Subcutaneous Daily  . famotidine  20 mg Oral BID  . insulin aspart  0-5 Units Subcutaneous QHS  . insulin aspart  0-9 Units Subcutaneous TID WC  . insulin aspart  3 Units Subcutaneous TID WC  . insulin glargine  20 Units Subcutaneous QHS  . magnesium sulfate 1 - 4 g bolus IVPB  3 g Intravenous Once  . metoprolol tartrate  50 mg Oral BID  . multivitamin with minerals  1 tablet Oral Daily  . thiamine  100 mg Oral Daily  . vancomycin  1,750 mg Intravenous Q12H   SUBJECTIVE: He is adamant that he is going home today.  Review of Systems: Review of systems not obtained due to patient factors.  Past Medical History  Diagnosis Date  . Diabetes mellitus without complication   . Hypertension   . Hyperlipemia   . Blind right eye   . 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 Smoking status: Current Every Day Smoker -- 1.50 packs/day    Types: Cigarettes  . Smokeless tobacco: None  . Alcohol Use: 16.8 oz/week    28 Cans of beer per week    Family History   Problem Relation Age of Onset  . Diabetes Mellitus II Mother   . Diabetes Mellitus II Father   . Diabetes Mellitus II Sister   . Diabetes Mellitus II Sister    Allergies  Allergen Reactions  . Penicillins Rash and Other (See Comments)    Possibly rash?    OBJECTIVE: Filed Vitals:   04/14/15 1255 04/14/15 2109 04/15/15 0517 04/15/15 1047  BP: 175/81 172/83 178/74 172/73  Pulse: 72 71 67 70  Temp: 97.7 F (36.5 C) 98.6 F (37 C) 98.3 F (36.8 C)   TempSrc: Oral Oral Oral   Resp: 18  18 18   Height:      Weight:      SpO2: 97% 95% 97% 98%   Body mass index is 29.83 kg/(m^2).  General: he is much more alert Skin: no splinter or conjunctival hemorrhages. Abrasion right elbow Unchanged Lungs: clear Cor: regular S1 and S2 no murmurs Right thigh: less pain with palpation  Lab Results Lab Results  Component Value Date   WBC 8.3 04/15/2015   HGB 13.8 04/15/2015   HCT 40.2 04/15/2015   MCV 88.2 04/15/2015   PLT 352 04/15/2015    Lab Results  Component Value Date  CREATININE 0.56* 04/15/2015   BUN 7 04/15/2015   NA 135 04/15/2015   K 3.5 04/15/2015   CL 97* 04/15/2015   CO2 27 04/15/2015    Lab Results  Component Value Date   ALT 24 04/11/2015   AST 41 04/11/2015   ALKPHOS 94 04/11/2015   BILITOT 0.7 04/11/2015     Microbiology: Recent Results (from the past 240 hour(s))  Culture, blood (routine x 2)     Status: None   Collection Time: 04/08/15  8:15 PM  Result Value Ref Range Status   Specimen Description BLOOD RIGHT ARM  Final   Special Requests BOTTLES DRAWN AEROBIC AND ANAEROBIC 10ML  Final   Culture  Setup Time   Final    GRAM POSITIVE COCCI IN CLUSTERS IN BOTH AEROBIC AND ANAEROBIC BOTTLES CRITICAL RESULT CALLED TO, READ BACK BY AND VERIFIED WITH: J. BOZEMAN,RN AT 0848 ON [id number] BY Rhea Bleacher    Culture METHICILLIN RESISTANT STAPHYLOCOCCUS AUREUS  Final   Report Status 04/11/2015 FINAL  Final   Organism ID, Bacteria METHICILLIN RESISTANT  STAPHYLOCOCCUS AUREUS  Final      Susceptibility   Methicillin resistant staphylococcus aureus - MIC*    CIPROFLOXACIN <=0.5 SENSITIVE Sensitive     ERYTHROMYCIN >=8 RESISTANT Resistant     GENTAMICIN <=0.5 SENSITIVE Sensitive     OXACILLIN >=4 RESISTANT Resistant     TETRACYCLINE <=1 SENSITIVE Sensitive     VANCOMYCIN 1 SENSITIVE Sensitive     TRIMETH/SULFA <=10 SENSITIVE Sensitive     CLINDAMYCIN <=0.25 SENSITIVE Sensitive     RIFAMPIN <=0.5 SENSITIVE Sensitive     Inducible Clindamycin NEGATIVE Sensitive     * METHICILLIN RESISTANT STAPHYLOCOCCUS AUREUS  Culture, blood (routine x 2)     Status: None   Collection Time: 04/08/15  8:25 PM  Result Value Ref Range Status   Specimen Description BLOOD RIGHT FOREARM  Final   Special Requests BOTTLES DRAWN AEROBIC AND ANAEROBIC 10ML  Final   Culture  Setup Time   Final    GRAM POSITIVE COCCI IN CLUSTERS IN BOTH AEROBIC AND ANAEROBIC BOTTLES CRITICAL RESULT CALLED TO, READ BACK BY AND VERIFIED WITH: J BOZEMAN 04/09/15 @ 0949 M VESTAL    Culture   Final    STAPHYLOCOCCUS AUREUS SUSCEPTIBILITIES PERFORMED ON PREVIOUS CULTURE WITHIN THE LAST 5 DAYS.    Report Status 04/11/2015 FINAL  Final  Urine culture     Status: None   Collection Time: 04/08/15  9:20 PM  Result Value Ref Range Status   Specimen Description URINE, CLEAN CATCH  Final   Special Requests NONE  Final   Culture MULTIPLE SPECIES PRESENT, SUGGEST RECOLLECTION  Final   Report Status 04/10/2015 FINAL  Final  Culture, blood (routine x 2)     Status: None   Collection Time: 04/10/15  1:20 PM  Result Value Ref Range Status   Specimen Description BLOOD RIGHT ARM  Final   Special Requests BOTTLES DRAWN AEROBIC ONLY 5CC  Final   Culture NO GROWTH 5 DAYS  Final   Report Status 04/15/2015 FINAL  Final  Culture, blood (routine x 2)     Status: None   Collection Time: 04/10/15  1:30 PM  Result Value Ref Range Status   Specimen Description BLOOD RIGHT HAND  Final   Special  Requests IN PEDIATRIC BOTTLE 3CC  Final   Culture NO GROWTH 5 DAYS  Final   Report Status 04/15/2015 FINAL  Final    ASSESSMENT: He is waiting PICC placement.  He did not even want to talk to me today about the potential utility of TEE. He states that he needs to go home today. I would simply continue vancomycin for 3 more weeks to cover for the possibility of endocarditis as well as right thigh soft tissue infection.  PLAN: 1. Continue vancomycin For 3 more weeks through 05/05/2015  Michel Bickers, MD [facility] [phone] pager   [phone] cell 04/15/2015, 12:53 PM

## 2015-04-15 NOTE — Progress Notes (Signed)
OT Cancellation Note  Patient Details Name: Andrew [name] MRN: [id number] DOB: 02-Jun-1946   Cancelled Treatment:    Reason Eval/Treat Not Completed: Patient declined OT treatment on my attempt to provide services. He first reported, "I'm lazy." After encouragement provided to participate, patient reported, "I'm not doing anything right now, and don't come back." Note case management notes indicating patient refusing SNF placement, and that patient will be home alone at times because his son works. OT will attempt treatment again tomorrow.  Brant Peets A 04/15/2015, 10:13 AM

## 2015-04-16 NOTE — Progress Notes (Signed)
Patient's family has arrived and is "ready to take patient home family". The son Brynda Greathouse is the primary caregiver. Brynda Greathouse is able to verbalize his understanding of the communication between his sister and [facility] staff from earlier today. Discharge instructions, including next dose due times and medication changes are reviewed with the patient, Brynda Greathouse, and a family friend. Printed AVS and prescriptions are given to son. Patient is discharged to home via wheelchair. Accompanied by family

## 2015-04-17 ENCOUNTER — Emergency Department (HOSPITAL_COMMUNITY)
Admission: EM | Admit: 2015-04-17 | Discharge: 2015-04-18 | Disposition: A | Discharge status: Home / Self Care | Payer: Medicare Other | Attending: Emergency Medicine | Admitting: Emergency Medicine

## 2015-04-17 DIAGNOSIS — X58XXXA Exposure to other specified factors, initial encounter: Secondary | ICD-10-CM | POA: Insufficient documentation

## 2015-04-17 DIAGNOSIS — R531 Weakness: Secondary | ICD-10-CM | POA: Insufficient documentation

## 2015-04-17 DIAGNOSIS — Y9389 Activity, other specified: Secondary | ICD-10-CM | POA: Diagnosis not present

## 2015-04-17 DIAGNOSIS — Y9289 Other specified places as the place of occurrence of the external cause: Secondary | ICD-10-CM | POA: Diagnosis not present

## 2015-04-17 DIAGNOSIS — Z880 Allergy status to penicillin: Secondary | ICD-10-CM | POA: Insufficient documentation

## 2015-04-17 DIAGNOSIS — S0993XA Unspecified injury of face, initial encounter: Secondary | ICD-10-CM | POA: Insufficient documentation

## 2015-04-17 DIAGNOSIS — Z720 Tobacco use: Secondary | ICD-10-CM | POA: Diagnosis not present

## 2015-04-17 DIAGNOSIS — I10 Essential (primary) hypertension: Secondary | ICD-10-CM | POA: Diagnosis not present

## 2015-04-17 DIAGNOSIS — H5441 Blindness, right eye, normal vision left eye: Secondary | ICD-10-CM | POA: Insufficient documentation

## 2015-04-17 DIAGNOSIS — Z79899 Other long term (current) drug therapy: Secondary | ICD-10-CM | POA: Diagnosis not present

## 2015-04-17 DIAGNOSIS — E1165 Type 2 diabetes mellitus with hyperglycemia: Secondary | ICD-10-CM | POA: Diagnosis present

## 2015-04-17 DIAGNOSIS — Y998 Other external cause status: Secondary | ICD-10-CM | POA: Insufficient documentation

## 2015-04-17 DIAGNOSIS — R569 Unspecified convulsions: Secondary | ICD-10-CM | POA: Insufficient documentation

## 2015-04-17 DIAGNOSIS — Z7982 Long term (current) use of aspirin: Secondary | ICD-10-CM | POA: Diagnosis not present

## 2015-04-18 ENCOUNTER — Encounter (HOSPITAL_COMMUNITY): Payer: Self-pay | Admitting: Emergency Medicine

## 2015-04-18 ENCOUNTER — Emergency Department (HOSPITAL_COMMUNITY): Payer: Medicare Other

## 2015-04-18 LAB — CBC WITH DIFFERENTIAL/PLATELET
BASOS ABS: 0 10*3/uL (ref 0.0–0.1)
BASOS PCT: 0 % (ref 0–1)
EOS ABS: 0.2 10*3/uL (ref 0.0–0.7)
Eosinophils Relative: 1 % (ref 0–5)
HEMATOCRIT: 38.8 % — AB (ref 39.0–52.0)
Hemoglobin: 13.3 g/dL (ref 13.0–17.0)
Lymphocytes Relative: 7 % — ABNORMAL LOW (ref 12–46)
Lymphs Abs: 1.2 10*3/uL (ref 0.7–4.0)
MCH: 31.1 pg (ref 26.0–34.0)
MCHC: 34.3 g/dL (ref 30.0–36.0)
MCV: 90.7 fL (ref 78.0–100.0)
MONO ABS: 1 10*3/uL (ref 0.1–1.0)
Monocytes Relative: 6 % (ref 3–12)
NEUTROS ABS: 14.3 10*3/uL — AB (ref 1.7–7.7)
NEUTROS PCT: 86 % — AB (ref 43–77)
Platelets: 413 10*3/uL — ABNORMAL HIGH (ref 150–400)
RBC: 4.28 MIL/uL (ref 4.22–5.81)
RDW: 13 % (ref 11.5–15.5)
WBC: 16.7 10*3/uL — ABNORMAL HIGH (ref 4.0–10.5)

## 2015-04-18 LAB — URINALYSIS, ROUTINE W REFLEX MICROSCOPIC
BILIRUBIN URINE: NEGATIVE
Glucose, UA: >1000 mg/dL — AB
KETONES UR: NEGATIVE mg/dL
Leukocytes, UA: NEGATIVE
NITRITE: NEGATIVE
SPECIFIC GRAVITY, URINE: 1.015 (ref 1.005–1.030)
UROBILINOGEN UA: 0.2 mg/dL (ref 0.0–1.0)
pH: 7 (ref 5.0–8.0)

## 2015-04-18 LAB — COMPREHENSIVE METABOLIC PANEL
ALK PHOS: 183 U/L — AB (ref 38–126)
ALT: 33 U/L (ref 17–63)
ANION GAP: 9 (ref 5–15)
AST: 28 U/L (ref 15–41)
Albumin: 2.8 g/dL — ABNORMAL LOW (ref 3.5–5.0)
BILIRUBIN TOTAL: 0.7 mg/dL (ref 0.3–1.2)
BUN: 10 mg/dL (ref 6–20)
CALCIUM: 9 mg/dL (ref 8.9–10.3)
CO2: 30 mmol/L (ref 22–32)
CREATININE: 0.6 mg/dL — AB (ref 0.61–1.24)
Chloride: 94 mmol/L — ABNORMAL LOW (ref 101–111)
GFR calc non Af Amer: >60 mL/min (ref >60)
Glucose, Bld: 380 mg/dL — ABNORMAL HIGH (ref 65–99)
Potassium: 3.9 mmol/L (ref 3.5–5.1)
Sodium: 133 mmol/L — ABNORMAL LOW (ref 135–145)
TOTAL PROTEIN: 6.4 g/dL — AB (ref 6.5–8.1)

## 2015-04-18 LAB — TROPONIN I

## 2015-04-18 LAB — URINE MICROSCOPIC-ADD ON

## 2015-04-18 LAB — ETHANOL

## 2015-04-18 LAB — CBG MONITORING, ED: Glucose-Capillary: 383 mg/dL — ABNORMAL HIGH (ref 65–99)

## 2015-04-18 MED ORDER — LORAZEPAM 1 MG PO TABS: ORAL_TABLET | ORAL | Status: DC

## 2015-04-18 NOTE — Discharge Instructions (Signed)
You describe a seizure tonight. His CT scan did not show a reason for a seizure. He may only have one, if he has another, return to the ED and he will be started on seizure medication at that time. You should get a call about getting an outpatient MR of his brain to further evaluate him for seizures. Call Dr Freddie Apley office, he is a neurologist, to have him do further evaluation of seizures on your father, such as an EEG or brain wave test. NO DRIVING until told he can by his doctors.    Driving and Equipment Restrictions Some medical problems make it dangerous to drive, ride a bike, or use machines. Some of these problems are:  A hard blow to the head (concussion).  Passing out (fainting).  Twitching and shaking (seizures).  Low blood sugar.  Taking medicine to help you relax (sedatives).  Taking pain medicines.  Wearing an eye patch.  Wearing splints. This can make it hard to use parts of your body that you need to drive safely. HOME CARE   Do not drive until your doctor says it is okay.  Do not use machines until your doctor says it is okay. You may need a form signed by your doctor (medical release) before you can drive again. You may also need this form before you do other tasks where you need to be fully alert. MAKE SURE YOU:  Understand these instructions.  Will watch your condition.  Will get help right away if you are not doing well or get worse. Document Released: 09/10/2004 Document Revised: 10/26/2011 Document Reviewed: 12/11/2009 [facility] Patient Information 2015 [place], [place]. This information is not intended to replace advice given to you by your health care provider. Make sure you discuss any questions you have with your health care provider.  Seizure, Adult A seizure is abnormal electrical activity in the brain. Seizures usually last from 30 seconds to 2 minutes. There are various types of seizures. Before a seizure, you may have a warning sensation (aura)  that a seizure is about to occur. An aura may include the following symptoms:   Fear or anxiety.  Nausea.  Feeling like the room is spinning (vertigo).  Vision changes, such as seeing flashing lights or spots. Common symptoms during a seizure include:  A change in attention or behavior (altered mental status).  Convulsions with rhythmic jerking movements.  Drooling.  Rapid eye movements.  Grunting.  Loss of bladder and bowel control.  Bitter taste in the mouth.  Tongue biting. After a seizure, you may feel confused and sleepy. You may also have an injury resulting from convulsions during the seizure. HOME CARE INSTRUCTIONS   If you are given medicines, take them exactly as prescribed by your health care provider.  Keep all follow-up appointments as directed by your health care provider.  Do not swim or drive or engage in risky activity during which a seizure could cause further injury to you or others until your health care provider says it is OK.  Get adequate rest.  Teach friends and family what to do if you have a seizure. They should:  Lay you on the ground to prevent a fall.  Put a cushion under your head.  Loosen any tight clothing around your neck.  Turn you on your side. If vomiting occurs, this helps keep your airway clear.  Stay with you until you recover.  Know whether or not you need emergency care. SEEK IMMEDIATE MEDICAL CARE IF:  The  seizure lasts longer than 5 minutes.  The seizure is severe or you do not wake up immediately after the seizure.  You have an altered mental status after the seizure.  You are having more frequent or worsening seizures. Someone should drive you to the emergency department or call local emergency services (911 in [place] MAKE SURE YOU:  Understand these instructions.  Will watch your condition.  Will get help right away if you are not doing well or get worse. Document Released: 07/31/2000 Document Revised:  05/24/2013 Document Reviewed: 03/15/2013 [facility] Patient Information 2015 [place], [place]. This information is not intended to replace advice given to you by your health care provider. Make sure you discuss any questions you have with your health care provider.
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
## 2015-04-18 NOTE — ED Notes (Signed)
Per ems they were called out for cpr and family was doing chest compressions on pt while he was talking to them. Pt was combative when ems arrive and blood sugar 346.

## 2015-04-19 ENCOUNTER — Telehealth: Payer: Self-pay | Admitting: Vascular Surgery

## 2015-04-19 NOTE — ED Notes (Signed)
Patient daughter at bedside until time of discharge, patient didn't have any further incidents of getting up unassisted, he was given assistance with all of his bathroom room needs x 1 person assistance.

## 2015-04-19 NOTE — ED Notes (Signed)
Patient ambulated unassisted out of bed and wake to back of door and urinated on floor, patient legs gave out and patient was assisted to floor by Maurine Simmering, ED tech.  After assessing patient for any injuries, he was able to stand with two person assist, and placed back in bed.  No injuries noted, vital signs stable. Fall bracelet and socks placed on patient, with frequent monitoring.

## 2015-04-19 NOTE — Telephone Encounter (Addendum)
-----   Message from Mena Goes, RN sent at 04/14/2015 12:44 PM EDT ----- Regarding: Schedule   ----- Message -----    From: Rosetta Posner, MD    Sent: 04/13/2015   9:16 AM      To: [facility] Charge Pool  Level III consult for incidental finding of 4.8 cm aneurysm. Needs office visit and ultrasound with me in 6 months. Does not need to go on the green list.  unable to notify patient by phone of fu appt.  on 10-22-14 at 9:30 for lab and then to see dr. early at 10:30, mailed appt. letter

## 2015-04-22 ENCOUNTER — Other Ambulatory Visit (HOSPITAL_COMMUNITY)
Admission: RE | Admit: 2015-04-22 | Discharge: 2015-04-22 | Disposition: A | Discharge status: Home / Self Care | Payer: Medicare Other | Source: Other Acute Inpatient Hospital | Attending: Internal Medicine | Admitting: Internal Medicine

## 2015-04-22 DIAGNOSIS — B9562 Methicillin resistant Staphylococcus aureus infection as the cause of diseases classified elsewhere: Secondary | ICD-10-CM | POA: Diagnosis present

## 2015-04-22 LAB — CBC WITH DIFFERENTIAL/PLATELET
BASOS ABS: 0.1 10*3/uL (ref 0.0–0.1)
BASOS PCT: 1 % (ref 0–1)
EOS PCT: 2 % (ref 0–5)
Eosinophils Absolute: 0.3 10*3/uL (ref 0.0–0.7)
HCT: 42 % (ref 39.0–52.0)
Hemoglobin: 14.4 g/dL (ref 13.0–17.0)
LYMPHS PCT: 18 % (ref 12–46)
Lymphs Abs: 2 10*3/uL (ref 0.7–4.0)
MCH: 31.3 pg (ref 26.0–34.0)
MCHC: 34.3 g/dL (ref 30.0–36.0)
MCV: 91.3 fL (ref 78.0–100.0)
MONO ABS: 1.1 10*3/uL — AB (ref 0.1–1.0)
Monocytes Relative: 9 % (ref 3–12)
NEUTROS ABS: 8.2 10*3/uL — AB (ref 1.7–7.7)
Neutrophils Relative %: 70 % (ref 43–77)
PLATELETS: 436 10*3/uL — AB (ref 150–400)
RBC: 4.6 MIL/uL (ref 4.22–5.81)
RDW: 12.9 % (ref 11.5–15.5)
WBC: 11.7 10*3/uL — AB (ref 4.0–10.5)

## 2015-04-22 LAB — BASIC METABOLIC PANEL
ANION GAP: 8 (ref 5–15)
BUN: 12 mg/dL (ref 6–20)
CALCIUM: 9.1 mg/dL (ref 8.9–10.3)
CO2: 31 mmol/L (ref 22–32)
Chloride: 92 mmol/L — ABNORMAL LOW (ref 101–111)
Creatinine, Ser: 0.62 mg/dL (ref 0.61–1.24)
GFR calc Af Amer: >60 mL/min (ref >60)
GLUCOSE: 392 mg/dL — AB (ref 65–99)
POTASSIUM: 5 mmol/L (ref 3.5–5.1)
SODIUM: 131 mmol/L — AB (ref 135–145)

## 2015-04-22 LAB — C-REACTIVE PROTEIN

## 2015-04-22 LAB — VANCOMYCIN, TROUGH: Vancomycin Tr: 22 ug/mL — ABNORMAL HIGH (ref 10.0–20.0)

## 2015-04-22 LAB — SEDIMENTATION RATE: SED RATE: 46 mm/h — AB (ref 0–16)

## 2015-04-28 ENCOUNTER — Other Ambulatory Visit (HOSPITAL_COMMUNITY)
Admission: RE | Admit: 2015-04-28 | Discharge: 2015-04-28 | Disposition: A | Discharge status: Home / Self Care | Payer: Medicare Other | Source: Skilled Nursing Facility | Attending: Internal Medicine | Admitting: Internal Medicine

## 2015-04-28 DIAGNOSIS — Z5181 Encounter for therapeutic drug level monitoring: Secondary | ICD-10-CM | POA: Diagnosis present

## 2015-04-28 LAB — BASIC METABOLIC PANEL
Anion gap: 10 (ref 5–15)
BUN: 14 mg/dL (ref 6–20)
CALCIUM: 8.9 mg/dL (ref 8.9–10.3)
CO2: 29 mmol/L (ref 22–32)
Chloride: 96 mmol/L — ABNORMAL LOW (ref 101–111)
Creatinine, Ser: 0.5 mg/dL — ABNORMAL LOW (ref 0.61–1.24)
GFR calc Af Amer: >60 mL/min (ref >60)
GLUCOSE: 236 mg/dL — AB (ref 65–99)
Potassium: 4 mmol/L (ref 3.5–5.1)
Sodium: 135 mmol/L (ref 135–145)

## 2015-04-28 LAB — CBC WITH DIFFERENTIAL/PLATELET
BASOS ABS: 0.2 10*3/uL — AB (ref 0.0–0.1)
Basophils Relative: 2 % — ABNORMAL HIGH (ref 0–1)
EOS PCT: 3 % (ref 0–5)
Eosinophils Absolute: 0.3 10*3/uL (ref 0.0–0.7)
HEMATOCRIT: 44 % (ref 39.0–52.0)
Hemoglobin: 14.7 g/dL (ref 13.0–17.0)
LYMPHS PCT: 21 % (ref 12–46)
Lymphs Abs: 1.9 10*3/uL (ref 0.7–4.0)
MCH: 30.8 pg (ref 26.0–34.0)
MCHC: 33.4 g/dL (ref 30.0–36.0)
MCV: 92.2 fL (ref 78.0–100.0)
MONO ABS: 1 10*3/uL (ref 0.1–1.0)
MONOS PCT: 11 % (ref 3–12)
Neutro Abs: 5.8 10*3/uL (ref 1.7–7.7)
Neutrophils Relative %: 63 % (ref 43–77)
PLATELETS: 313 10*3/uL (ref 150–400)
RBC: 4.77 MIL/uL (ref 4.22–5.81)
RDW: 13.3 % (ref 11.5–15.5)
WBC: 9.2 10*3/uL (ref 4.0–10.5)

## 2015-04-28 LAB — C-REACTIVE PROTEIN

## 2015-04-28 LAB — SEDIMENTATION RATE: SED RATE: 24 mm/h — AB (ref 0–16)

## 2015-04-28 LAB — VANCOMYCIN, TROUGH: Vancomycin Tr: 17 ug/mL (ref 10.0–20.0)

## 2015-05-05 ENCOUNTER — Other Ambulatory Visit (HOSPITAL_COMMUNITY)
Admission: RE | Admit: 2015-05-05 | Discharge: 2015-05-05 | Disposition: A | Discharge status: Home / Self Care | Payer: Medicare Other | Source: Other Acute Inpatient Hospital | Attending: Internal Medicine | Admitting: Internal Medicine

## 2015-05-05 DIAGNOSIS — Z5181 Encounter for therapeutic drug level monitoring: Secondary | ICD-10-CM | POA: Diagnosis present

## 2015-05-05 DIAGNOSIS — E118 Type 2 diabetes mellitus with unspecified complications: Secondary | ICD-10-CM | POA: Diagnosis present

## 2015-05-05 LAB — CBC WITH DIFFERENTIAL/PLATELET
Basophils Absolute: 0.1 K/uL (ref 0.0–0.1)
Basophils Relative: 1 %
Eosinophils Absolute: 0.4 K/uL (ref 0.0–0.7)
Eosinophils Relative: 4 %
HCT: 41.4 % (ref 39.0–52.0)
Hemoglobin: 13.9 g/dL (ref 13.0–17.0)
Lymphocytes Relative: 18 %
Lymphs Abs: 1.8 K/uL (ref 0.7–4.0)
MCH: 30.1 pg (ref 26.0–34.0)
MCHC: 33.6 g/dL (ref 30.0–36.0)
MCV: 89.6 fL (ref 78.0–100.0)
Monocytes Absolute: 1.5 K/uL — ABNORMAL HIGH (ref 0.1–1.0)
Monocytes Relative: 15 %
Neutro Abs: 6 K/uL (ref 1.7–7.7)
Neutrophils Relative %: 62 %
Platelets: 338 K/uL (ref 150–400)
RBC: 4.62 MIL/uL (ref 4.22–5.81)
RDW: 13 % (ref 11.5–15.5)
WBC: 9.6 K/uL (ref 4.0–10.5)

## 2015-05-05 LAB — BASIC METABOLIC PANEL WITH GFR
Anion gap: 9 (ref 5–15)
BUN: 15 mg/dL (ref 6–20)
CO2: 29 mmol/L (ref 22–32)
Calcium: 8.6 mg/dL — ABNORMAL LOW (ref 8.9–10.3)
Chloride: 97 mmol/L — ABNORMAL LOW (ref 101–111)
Creatinine, Ser: 0.55 mg/dL — ABNORMAL LOW (ref 0.61–1.24)
GFR calc Af Amer: >60 mL/min
GFR calc non Af Amer: >60 mL/min
Glucose, Bld: 114 mg/dL — ABNORMAL HIGH (ref 65–99)
Potassium: 4.3 mmol/L (ref 3.5–5.1)
Sodium: 135 mmol/L (ref 135–145)

## 2015-05-05 LAB — SEDIMENTATION RATE: Sed Rate: 38 mm/hr — ABNORMAL HIGH (ref 0–16)

## 2015-05-05 LAB — C-REACTIVE PROTEIN: CRP: 1.1 mg/dL — ABNORMAL HIGH

## 2015-05-05 LAB — VANCOMYCIN, TROUGH: VANCOMYCIN TR: 19 ug/mL (ref 10.0–20.0)

## 2015-05-09 ENCOUNTER — Telehealth: Payer: Self-pay | Admitting: Internal Medicine

## 2015-05-09 NOTE — Telephone Encounter (Signed)
[facility] pharmacy called asking about the end date of the antibiotics and the end date. I looked at the progress note from Dr. Megan Salon and gave them the stop date of 05/05/2015

## 2015-07-16 ENCOUNTER — Encounter: Payer: Self-pay | Admitting: Internal Medicine

## 2015-10-22 ENCOUNTER — Other Ambulatory Visit (HOSPITAL_COMMUNITY): Payer: Medicare Other

## 2015-10-22 ENCOUNTER — Ambulatory Visit: Payer: Medicare Other | Admitting: Vascular Surgery

## 2015-10-30 ENCOUNTER — Encounter: Payer: Self-pay | Admitting: Vascular Surgery

## 2015-11-05 ENCOUNTER — Ambulatory Visit: Payer: Medicare Other | Admitting: Vascular Surgery

## 2015-11-05 ENCOUNTER — Other Ambulatory Visit (HOSPITAL_COMMUNITY): Payer: Medicare Other

## 2016-01-16 DIAGNOSIS — H548 Legal blindness, as defined in USA: Secondary | ICD-10-CM | POA: Diagnosis present

## 2016-03-11 DIAGNOSIS — E1143 Type 2 diabetes mellitus with diabetic autonomic (poly)neuropathy: Secondary | ICD-10-CM | POA: Diagnosis present

## 2016-08-30 ENCOUNTER — Inpatient Hospital Stay (HOSPITAL_COMMUNITY): Payer: Medicare Other

## 2016-08-30 ENCOUNTER — Emergency Department (HOSPITAL_COMMUNITY): Payer: Medicare Other

## 2016-08-30 ENCOUNTER — Inpatient Hospital Stay (HOSPITAL_COMMUNITY)
Admission: EM | Admit: 2016-08-30 | Discharge: 2016-09-15 | DRG: 871 | Disposition: A | Discharge status: Home Health | Payer: Medicare Other | Attending: Family Medicine | Admitting: Family Medicine

## 2016-08-30 ENCOUNTER — Encounter (HOSPITAL_COMMUNITY): Payer: Self-pay | Admitting: Emergency Medicine

## 2016-08-30 DIAGNOSIS — L03113 Cellulitis of right upper limb: Secondary | ICD-10-CM | POA: Insufficient documentation

## 2016-08-30 DIAGNOSIS — E43 Unspecified severe protein-calorie malnutrition: Secondary | ICD-10-CM | POA: Diagnosis present

## 2016-08-30 DIAGNOSIS — Z833 Family history of diabetes mellitus: Secondary | ICD-10-CM | POA: Diagnosis not present

## 2016-08-30 DIAGNOSIS — F17210 Nicotine dependence, cigarettes, uncomplicated: Secondary | ICD-10-CM | POA: Diagnosis present

## 2016-08-30 DIAGNOSIS — E876 Hypokalemia: Secondary | ICD-10-CM | POA: Diagnosis present

## 2016-08-30 DIAGNOSIS — M71121 Other infective bursitis, right elbow: Secondary | ICD-10-CM | POA: Diagnosis present

## 2016-08-30 DIAGNOSIS — E1140 Type 2 diabetes mellitus with diabetic neuropathy, unspecified: Secondary | ICD-10-CM | POA: Diagnosis present

## 2016-08-30 DIAGNOSIS — I5032 Chronic diastolic (congestive) heart failure: Secondary | ICD-10-CM | POA: Diagnosis present

## 2016-08-30 DIAGNOSIS — E860 Dehydration: Secondary | ICD-10-CM | POA: Diagnosis present

## 2016-08-30 DIAGNOSIS — Z5320 Procedure and treatment not carried out because of patient's decision for unspecified reasons: Secondary | ICD-10-CM | POA: Diagnosis present

## 2016-08-30 DIAGNOSIS — J9811 Atelectasis: Secondary | ICD-10-CM | POA: Diagnosis present

## 2016-08-30 DIAGNOSIS — E872 Acidosis: Secondary | ICD-10-CM | POA: Diagnosis present

## 2016-08-30 DIAGNOSIS — J9601 Acute respiratory failure with hypoxia: Secondary | ICD-10-CM | POA: Diagnosis present

## 2016-08-30 DIAGNOSIS — I714 Abdominal aortic aneurysm, without rupture: Secondary | ICD-10-CM | POA: Diagnosis present

## 2016-08-30 DIAGNOSIS — L8990 Pressure ulcer of unspecified site, unspecified stage: Secondary | ICD-10-CM | POA: Insufficient documentation

## 2016-08-30 DIAGNOSIS — B9562 Methicillin resistant Staphylococcus aureus infection as the cause of diseases classified elsewhere: Secondary | ICD-10-CM | POA: Diagnosis present

## 2016-08-30 DIAGNOSIS — N179 Acute kidney failure, unspecified: Secondary | ICD-10-CM | POA: Diagnosis present

## 2016-08-30 DIAGNOSIS — R1312 Dysphagia, oropharyngeal phase: Secondary | ICD-10-CM | POA: Diagnosis present

## 2016-08-30 DIAGNOSIS — L0291 Cutaneous abscess, unspecified: Secondary | ICD-10-CM

## 2016-08-30 DIAGNOSIS — A4102 Sepsis due to Methicillin resistant Staphylococcus aureus: Secondary | ICD-10-CM | POA: Diagnosis present

## 2016-08-30 DIAGNOSIS — A419 Sepsis, unspecified organism: Secondary | ICD-10-CM | POA: Diagnosis present

## 2016-08-30 DIAGNOSIS — Z880 Allergy status to penicillin: Secondary | ICD-10-CM

## 2016-08-30 DIAGNOSIS — J189 Pneumonia, unspecified organism: Secondary | ICD-10-CM | POA: Diagnosis present

## 2016-08-30 DIAGNOSIS — G9341 Metabolic encephalopathy: Secondary | ICD-10-CM | POA: Diagnosis present

## 2016-08-30 DIAGNOSIS — Z79899 Other long term (current) drug therapy: Secondary | ICD-10-CM | POA: Diagnosis not present

## 2016-08-30 DIAGNOSIS — F1010 Alcohol abuse, uncomplicated: Secondary | ICD-10-CM | POA: Diagnosis present

## 2016-08-30 DIAGNOSIS — E871 Hypo-osmolality and hyponatremia: Secondary | ICD-10-CM | POA: Diagnosis present

## 2016-08-30 DIAGNOSIS — Z6821 Body mass index (BMI) 21.0-21.9, adult: Secondary | ICD-10-CM

## 2016-08-30 DIAGNOSIS — Y95 Nosocomial condition: Secondary | ICD-10-CM | POA: Diagnosis present

## 2016-08-30 DIAGNOSIS — L89029 Pressure ulcer of left elbow, unspecified stage: Secondary | ICD-10-CM | POA: Diagnosis present

## 2016-08-30 DIAGNOSIS — M25521 Pain in right elbow: Secondary | ICD-10-CM

## 2016-08-30 DIAGNOSIS — R748 Abnormal levels of other serum enzymes: Secondary | ICD-10-CM | POA: Diagnosis not present

## 2016-08-30 DIAGNOSIS — E11649 Type 2 diabetes mellitus with hypoglycemia without coma: Secondary | ICD-10-CM | POA: Diagnosis present

## 2016-08-30 DIAGNOSIS — R749 Abnormal serum enzyme level, unspecified: Secondary | ICD-10-CM | POA: Diagnosis not present

## 2016-08-30 DIAGNOSIS — F419 Anxiety disorder, unspecified: Secondary | ICD-10-CM | POA: Diagnosis present

## 2016-08-30 DIAGNOSIS — E785 Hyperlipidemia, unspecified: Secondary | ICD-10-CM | POA: Diagnosis present

## 2016-08-30 DIAGNOSIS — R778 Other specified abnormalities of plasma proteins: Secondary | ICD-10-CM | POA: Diagnosis present

## 2016-08-30 DIAGNOSIS — H5461 Unqualified visual loss, right eye, normal vision left eye: Secondary | ICD-10-CM | POA: Diagnosis present

## 2016-08-30 DIAGNOSIS — I10 Essential (primary) hypertension: Secondary | ICD-10-CM | POA: Diagnosis not present

## 2016-08-30 DIAGNOSIS — R079 Chest pain, unspecified: Secondary | ICD-10-CM | POA: Diagnosis present

## 2016-08-30 DIAGNOSIS — Z9119 Patient's noncompliance with other medical treatment and regimen: Secondary | ICD-10-CM

## 2016-08-30 DIAGNOSIS — Z794 Long term (current) use of insulin: Secondary | ICD-10-CM | POA: Diagnosis not present

## 2016-08-30 DIAGNOSIS — B9561 Methicillin susceptible Staphylococcus aureus infection as the cause of diseases classified elsewhere: Secondary | ICD-10-CM | POA: Diagnosis not present

## 2016-08-30 DIAGNOSIS — R0602 Shortness of breath: Secondary | ICD-10-CM

## 2016-08-30 DIAGNOSIS — E878 Other disorders of electrolyte and fluid balance, not elsewhere classified: Secondary | ICD-10-CM | POA: Diagnosis present

## 2016-08-30 DIAGNOSIS — Z7982 Long term (current) use of aspirin: Secondary | ICD-10-CM

## 2016-08-30 DIAGNOSIS — R531 Weakness: Secondary | ICD-10-CM

## 2016-08-30 DIAGNOSIS — I110 Hypertensive heart disease with heart failure: Secondary | ICD-10-CM | POA: Diagnosis present

## 2016-08-30 DIAGNOSIS — R7989 Other specified abnormal findings of blood chemistry: Secondary | ICD-10-CM

## 2016-08-30 DIAGNOSIS — R0603 Acute respiratory distress: Secondary | ICD-10-CM

## 2016-08-30 DIAGNOSIS — R7881 Bacteremia: Secondary | ICD-10-CM | POA: Diagnosis present

## 2016-08-30 LAB — I-STAT VENOUS BLOOD GAS, ED
ACID-BASE DEFICIT: 7 mmol/L — AB (ref 0.0–2.0)
Bicarbonate: 19.4 mmol/L — ABNORMAL LOW (ref 20.0–28.0)
O2 Saturation: 59 %
TCO2: 21 mmol/L (ref 0–100)
pCO2, Ven: 40.5 mmHg — ABNORMAL LOW (ref 44.0–60.0)
pH, Ven: 7.289 (ref 7.250–7.430)
pO2, Ven: 34 mmHg (ref 32.0–45.0)

## 2016-08-30 LAB — URINALYSIS, ROUTINE W REFLEX MICROSCOPIC
BILIRUBIN URINE: NEGATIVE
Glucose, UA: >=500 mg/dL — AB
KETONES UR: 20 mg/dL — AB
Leukocytes, UA: NEGATIVE
Nitrite: NEGATIVE
PH: 5 (ref 5.0–8.0)
Protein, ur: 100 mg/dL — AB
Specific Gravity, Urine: 1.02 (ref 1.005–1.030)

## 2016-08-30 LAB — PROTIME-INR
INR: 1.19
PROTHROMBIN TIME: 15.2 s (ref 11.4–15.2)

## 2016-08-30 LAB — CBC
HCT: 45.4 % (ref 39.0–52.0)
HEMOGLOBIN: 16.5 g/dL (ref 13.0–17.0)
MCH: 30.1 pg (ref 26.0–34.0)
MCHC: 36.3 g/dL — ABNORMAL HIGH (ref 30.0–36.0)
MCV: 82.8 fL (ref 78.0–100.0)
Platelets: 309 10*3/uL (ref 150–400)
RBC: 5.48 MIL/uL (ref 4.22–5.81)
RDW: 13.4 % (ref 11.5–15.5)
WBC: 24.8 10*3/uL — ABNORMAL HIGH (ref 4.0–10.5)

## 2016-08-30 LAB — I-STAT TROPONIN, ED: TROPONIN I, POC: 0.06 ng/mL (ref 0.00–0.08)

## 2016-08-30 LAB — C-REACTIVE PROTEIN: CRP: 26.1 mg/dL — ABNORMAL HIGH (ref <1.0)

## 2016-08-30 LAB — BASIC METABOLIC PANEL
ANION GAP: 20 — AB (ref 5–15)
BUN: 29 mg/dL — ABNORMAL HIGH (ref 6–20)
CALCIUM: 9.9 mg/dL (ref 8.9–10.3)
CO2: 17 mmol/L — AB (ref 22–32)
Chloride: 92 mmol/L — ABNORMAL LOW (ref 101–111)
Creatinine, Ser: 1.28 mg/dL — ABNORMAL HIGH (ref 0.61–1.24)
GFR calc non Af Amer: 55 mL/min — ABNORMAL LOW (ref >60)
Glucose, Bld: 279 mg/dL — ABNORMAL HIGH (ref 65–99)
Potassium: 4.2 mmol/L (ref 3.5–5.1)
Sodium: 129 mmol/L — ABNORMAL LOW (ref 135–145)

## 2016-08-30 LAB — PROCALCITONIN: Procalcitonin: 1.62 ng/mL

## 2016-08-30 LAB — APTT: APTT: 37 s — AB (ref 24–36)

## 2016-08-30 LAB — I-STAT CG4 LACTIC ACID, ED: Lactic Acid, Venous: 4.85 mmol/L (ref 0.5–1.9)

## 2016-08-30 LAB — GLUCOSE, CAPILLARY: Glucose-Capillary: 272 mg/dL — ABNORMAL HIGH (ref 65–99)

## 2016-08-30 LAB — SEDIMENTATION RATE: Sed Rate: 56 mm/hr — ABNORMAL HIGH (ref 0–16)

## 2016-08-30 LAB — BRAIN NATRIURETIC PEPTIDE: B NATRIURETIC PEPTIDE 5: 292.7 pg/mL — AB (ref 0.0–100.0)

## 2016-08-30 LAB — TROPONIN I: TROPONIN I: 0.07 ng/mL — AB (ref <0.03)

## 2016-08-30 LAB — CBG MONITORING, ED: Glucose-Capillary: 303 mg/dL — ABNORMAL HIGH (ref 65–99)

## 2016-08-30 LAB — D-DIMER, QUANTITATIVE (NOT AT ARMC): D DIMER QUANT: 2.64 ug{FEU}/mL — AB (ref 0.00–0.50)

## 2016-08-30 LAB — LACTIC ACID, PLASMA: Lactic Acid, Venous: 1.5 mmol/L (ref 0.5–1.9)

## 2016-08-30 MED ORDER — ONDANSETRON HCL 4 MG/2ML IJ SOLN: 4.0000 mg | Freq: Four times a day (QID) | INTRAMUSCULAR | Status: DC | PRN

## 2016-08-30 MED ORDER — SODIUM CHLORIDE 0.9 % IV BOLUS (SEPSIS): 1000.0000 mL | Freq: Once | INTRAVENOUS | Status: DC

## 2016-08-30 MED ORDER — ADULT MULTIVITAMIN W/MINERALS CH
1.0000 | ORAL_TABLET | Freq: Every day | ORAL | Status: DC
  Administered 2016-08-31 – 2016-09-15 (×15): 1 via ORAL
  Filled 2016-08-30 (×16): qty 1

## 2016-08-30 MED ORDER — ZOLPIDEM TARTRATE 5 MG PO TABS
5.0000 mg | ORAL_TABLET | Freq: Every evening | ORAL | Status: DC | PRN
  Administered 2016-08-30 – 2016-09-06 (×3): 5 mg via ORAL
  Filled 2016-08-30 (×3): qty 1

## 2016-08-30 MED ORDER — VANCOMYCIN HCL IN DEXTROSE 750-5 MG/150ML-% IV SOLN
750.0000 mg | Freq: Two times a day (BID) | INTRAVENOUS | Status: DC
  Filled 2016-08-30: qty 150

## 2016-08-30 MED ORDER — VITAMIN B-1 100 MG PO TABS
100.0000 mg | ORAL_TABLET | Freq: Every day | ORAL | Status: DC
  Administered 2016-08-31 – 2016-09-15 (×15): 100 mg via ORAL
  Filled 2016-08-30 (×17): qty 1

## 2016-08-30 MED ORDER — OXYCODONE-ACETAMINOPHEN 5-325 MG PO TABS
1.0000 | ORAL_TABLET | Freq: Four times a day (QID) | ORAL | Status: DC | PRN
  Administered 2016-08-30 – 2016-09-07 (×7): 1 via ORAL
  Filled 2016-08-30 (×8): qty 1

## 2016-08-30 MED ORDER — AMLODIPINE BESYLATE 10 MG PO TABS
10.0000 mg | ORAL_TABLET | Freq: Every day | ORAL | Status: DC
  Administered 2016-08-31 – 2016-09-15 (×15): 10 mg via ORAL
  Filled 2016-08-30 (×3): qty 1
  Filled 2016-08-30: qty 2
  Filled 2016-08-30: qty 1
  Filled 2016-08-30 (×5): qty 2
  Filled 2016-08-30: qty 1
  Filled 2016-08-30: qty 2
  Filled 2016-08-30: qty 1
  Filled 2016-08-30: qty 2
  Filled 2016-08-30 (×2): qty 1
  Filled 2016-08-30: qty 2

## 2016-08-30 MED ORDER — INSULIN GLARGINE 100 UNIT/ML ~~LOC~~ SOLN
3.0000 [IU] | Freq: Every day | SUBCUTANEOUS | Status: DC
  Administered 2016-08-30: 3 [IU] via SUBCUTANEOUS
  Filled 2016-08-30: qty 0.03

## 2016-08-30 MED ORDER — SODIUM CHLORIDE 0.9 % IV SOLN
INTRAVENOUS | Status: DC
  Administered 2016-08-30: 21:00:00 via INTRAVENOUS

## 2016-08-30 MED ORDER — SODIUM CHLORIDE 0.9 % IV BOLUS (SEPSIS)
1500.0000 mL | Freq: Once | INTRAVENOUS | Status: AC
  Administered 2016-08-30: 1500 mL via INTRAVENOUS

## 2016-08-30 MED ORDER — ACETAMINOPHEN 325 MG PO TABS: 650.0000 mg | ORAL_TABLET | ORAL | Status: DC | PRN

## 2016-08-30 MED ORDER — METOPROLOL SUCCINATE ER 100 MG PO TB24
100.0000 mg | ORAL_TABLET | Freq: Every day | ORAL | Status: DC
  Administered 2016-08-31 – 2016-09-15 (×15): 100 mg via ORAL
  Filled 2016-08-30 (×16): qty 1

## 2016-08-30 MED ORDER — SODIUM CHLORIDE 0.9 % IV BOLUS (SEPSIS)
1000.0000 mL | Freq: Once | INTRAVENOUS | Status: AC
  Administered 2016-08-30: 1000 mL via INTRAVENOUS

## 2016-08-30 MED ORDER — PIPERACILLIN-TAZOBACTAM 3.375 G IVPB 30 MIN
3.3750 g | Freq: Once | INTRAVENOUS | Status: AC
  Administered 2016-08-30: 3.375 g via INTRAVENOUS
  Filled 2016-08-30: qty 50

## 2016-08-30 MED ORDER — INSULIN ASPART 100 UNIT/ML ~~LOC~~ SOLN
0.0000 [IU] | Freq: Every day | SUBCUTANEOUS | Status: DC
  Administered 2016-08-30: 3 [IU] via SUBCUTANEOUS

## 2016-08-30 MED ORDER — ASPIRIN EC 81 MG PO TBEC: 81.0000 mg | DELAYED_RELEASE_TABLET | Freq: Every day | ORAL | Status: DC

## 2016-08-30 MED ORDER — ONDANSETRON HCL 4 MG/2ML IJ SOLN: 4.0000 mg | Freq: Three times a day (TID) | INTRAMUSCULAR | Status: DC | PRN

## 2016-08-30 MED ORDER — VANCOMYCIN HCL IN DEXTROSE 1-5 GM/200ML-% IV SOLN
1000.0000 mg | Freq: Once | INTRAVENOUS | Status: AC
  Administered 2016-08-30: 1000 mg via INTRAVENOUS
  Filled 2016-08-30: qty 200

## 2016-08-30 MED ORDER — PIPERACILLIN-TAZOBACTAM 3.375 G IVPB
3.3750 g | Freq: Three times a day (TID) | INTRAVENOUS | Status: DC
  Administered 2016-08-31 (×2): 3.375 g via INTRAVENOUS
  Filled 2016-08-30 (×3): qty 50

## 2016-08-30 MED ORDER — ALBUTEROL SULFATE (2.5 MG/3ML) 0.083% IN NEBU: 2.5000 mg | INHALATION_SOLUTION | RESPIRATORY_TRACT | Status: DC | PRN

## 2016-08-30 MED ORDER — ENOXAPARIN SODIUM 40 MG/0.4ML ~~LOC~~ SOLN
40.0000 mg | SUBCUTANEOUS | Status: DC
  Administered 2016-08-30 – 2016-09-14 (×16): 40 mg via SUBCUTANEOUS
  Filled 2016-08-30 (×18): qty 0.4

## 2016-08-30 MED ORDER — INSULIN ASPART 100 UNIT/ML ~~LOC~~ SOLN
0.0000 [IU] | Freq: Three times a day (TID) | SUBCUTANEOUS | Status: DC
  Administered 2016-09-01: 2 [IU] via SUBCUTANEOUS
  Administered 2016-09-02: 3 [IU] via SUBCUTANEOUS

## 2016-08-30 NOTE — ED Triage Notes (Addendum)
Per family onset yesterday with weakness, shortness of breath, and chest pain. Family also reports that blood sugar was > 500 yesterday. Pt unable to sit up in wheelchair in triage. Pt also has redness, swelling to left elbow area.

## 2016-08-30 NOTE — ED Provider Notes (Signed)
[facility] DEPT Provider Note  CSN: [id number] Arrival date & time: 08/30/16  [id number]  History   Chief Complaint Chief Complaint  Patient presents with  . Weakness  . Shortness of Breath  . Chest Pain   HPI LADARIUS [facility] [name] is a 71 y.o. male.  The history is provided by a relative and medical records.  Illness  This is a new problem. The current episode [facility] [name] more than 2 days ago. The problem occurs constantly. The problem has been gradually worsening. Nothing aggravates the symptoms. Nothing relieves the symptoms.    Past Medical History:  Diagnosis Date  . Blind right eye   . Diabetes mellitus without complication ([place])   . Hyperlipemia   . Hypertension   . 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 Sepsis ([place]) 08/30/2016  . Chest pain 08/30/2016  . Cellulitis of right elbow 08/30/2016  . AKI (acute kidney injury) ([place]) 08/30/2016  . Chronic diastolic CHF (congestive heart failure) ([place]) 08/30/2016  . Elbow pain, right   . Essential hypertension   . Hypokalemia 04/12/2015  . 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 Hypomagnesemia   . Bacteremia due to methicillin resistant Staphylococcus aureus 04/10/2015  . Atherosclerotic peripheral vascular disease ([place]) 04/10/2015  . Acute pain of right thigh 04/10/2015  . Right thigh pain   . Acute bronchitis 04/09/2015  . Hypertension 04/09/2015  . Hyperlipidemia 04/09/2015  . Alcohol abuse 04/09/2015  . Tobacco abuse disorder 04/09/2015  . Acute encephalopathy   . Diabetic ketoacidosis without coma associated with type 2 diabetes mellitus ([place])   . Leg weakness    History reviewed. No pertinent surgical history.   Home Medications    Prior to Admission medications   Medication Sig Start Date End Date Taking? Authorizing Provider  amLODipine (NORVASC) 10 MG tablet Take 1 tablet (10 mg total) by  mouth daily. 04/15/15  Yes Eugenie Filler, MD  aspirin 325 MG tablet Take 325 mg by mouth daily.   Yes Historical Provider, MD  gabapentin (NEURONTIN) 100 MG capsule Take 100 mg by mouth 3 (three) times daily.   Yes Historical Provider, MD  Insulin Glargine (TOUJEO SOLOSTAR) 300 UNIT/ML SOPN Inject 60 units of lipase into the skin daily. Per sliding scale   Yes Historical Provider, MD  JANUVIA 100 MG tablet Take 100 mg by mouth daily. 02/07/15  Yes Historical Provider, MD  lisinopril (PRINIVIL,ZESTRIL) 10 MG tablet Take 10 mg by mouth daily.   Yes Historical Provider, MD  metFORMIN (GLUCOPHAGE) 500 MG tablet Take 1,000 mg by mouth 2 (two) times daily with a meal.   Yes Historical Provider, MD  metoprolol succinate (TOPROL-XL) 50 MG 24 hr tablet Take 50 mg by mouth daily. Take with or immediately following a meal.   Yes Historical Provider, MD  simvastatin (ZOCOR) 40 MG tablet Take 40 mg by mouth daily.   Yes Historical Provider, MD  aspirin EC 81 MG tablet Take 81 mg by mouth daily.    Historical Provider, MD  LORazepam (ATIVAN) 1 MG tablet Take 1 tablet 30 minutes before MRI Patient not taking: Reported on 08/30/2016 04/18/15   Rolland Porter, MD  metoprolol succinate (TOPROL-XL) 100 MG 24 hr tablet Take 1 tablet (100 mg total) by mouth daily. Patient not taking: Reported on 08/30/2016 04/15/15   Eugenie Filler, MD  Multiple Vitamin (MULTIVITAMIN WITH MINERALS) TABS tablet Take 1 tablet  by mouth daily. Patient not taking: Reported on 08/30/2016 04/15/15   Eugenie Filler, MD  thiamine 100 MG tablet Take 1 tablet (100 mg total) by mouth daily. Patient not taking: Reported on 08/30/2016 04/15/15   Eugenie Filler, MD  traMADol (ULTRAM) 50 MG tablet Take 1 tablet (50 mg total) by mouth every 6 (six) hours as needed for moderate pain. Patient not taking: Reported on 08/30/2016 04/15/15   Eugenie Filler, MD   Family History Family History  Problem Relation Age of Onset  . Diabetes Mellitus II Mother     . Diabetes Mellitus II Father   . Diabetes Mellitus II Sister   . Diabetes Mellitus II Sister    Social History Social History  Substance Use Topics  . Smoking status: Current Every Day Smoker    Packs/day: 1.50    Types: Cigarettes  . Smokeless tobacco: Never Used  . Alcohol use 16.8 oz/week    28 Cans of beer per week    Allergies   Penicillins  Review of Systems Review of Systems  Unable to perform ROS: Acuity of condition  Constitutional: Positive for appetite change (decreased), chills and fever.  Respiratory: Positive for cough.     Physical Exam Updated Vital Signs BP (!) 146/71 (BP Location: Left Arm)   Pulse 99   Temp 99.4 F (37.4 C) (Oral)   Resp 20   Ht 5\' 8"  (1.727 m)   Wt 65.4 kg   SpO2 98%   BMI 21.92 kg/m   Physical Exam  Constitutional: No distress.  Elderly ill-appearing Caucasian male  HENT:  Head: Normocephalic and atraumatic.  Eyes: Pupils are equal, round, and reactive to light.  Neck: Normal range of motion. Neck supple.  Cardiovascular: Regular rhythm and normal heart sounds.  Tachycardia present.   Borderline hypotensive  Pulmonary/Chest: Effort normal and breath sounds normal.  Coarse breath bases bilaterally  Abdominal: Soft. Bowel sounds are normal. He exhibits no distension.  Musculoskeletal: He exhibits no edema or deformity.  Neurological: He is alert. He displays normal reflexes.  Skin: Skin is dry. Capillary refill takes 2 to 3 seconds. He is not diaphoretic. There is pallor.  Nursing note and vitals reviewed.   ED Treatments / Results  Labs (all labs ordered are listed, but only abnormal results are displayed) Labs Reviewed  BASIC METABOLIC PANEL - Abnormal; Notable for the following:       Result Value   Sodium 129 (*)    Chloride 92 (*)    CO2 17 (*)    Glucose, Bld 279 (*)    BUN 29 (*)    Creatinine, Ser 1.28 (*)    GFR calc non Af Amer 55 (*)    Anion gap 20 (*)    All other components within normal limits   CBC - Abnormal; Notable for the following:    WBC 24.8 (*)    MCHC 36.3 (*)    All other components within normal limits  URINALYSIS, ROUTINE W REFLEX MICROSCOPIC - Abnormal; Notable for the following:    APPearance HAZY (*)    Glucose, UA >=500 (*)    Hgb urine dipstick MODERATE (*)    Ketones, ur 20 (*)    Protein, ur 100 (*)    Bacteria, UA RARE (*)    Squamous Epithelial / LPF 0-5 (*)    All other components within normal limits  BRAIN NATRIURETIC PEPTIDE - Abnormal; Notable for the following:    B Natriuretic Peptide 292.7 (*)  All other components within normal limits  D-DIMER, QUANTITATIVE (NOT AT [facility]) - Abnormal; Notable for the following:    D-Dimer, Quant 2.64 (*)    All other components within normal limits  C-REACTIVE PROTEIN - Abnormal; Notable for the following:    CRP 26.1 (*)    All other components within normal limits  SEDIMENTATION RATE - Abnormal; Notable for the following:    Sed Rate 56 (*)    All other components within normal limits  TROPONIN I - Abnormal; Notable for the following:    Troponin I 0.07 (*)    All other components within normal limits  APTT - Abnormal; Notable for the following:    aPTT 37 (*)    All other components within normal limits  GLUCOSE, CAPILLARY - Abnormal; Notable for the following:    Glucose-Capillary 272 (*)    All other components within normal limits  I-STAT CG4 LACTIC ACID, ED - Abnormal; Notable for the following:    Lactic Acid, Venous 4.85 (*)    All other components within normal limits  CBG MONITORING, ED - Abnormal; Notable for the following:    Glucose-Capillary 303 (*)    All other components within normal limits  I-STAT VENOUS BLOOD GAS, ED - Abnormal; Notable for the following:    pCO2, Ven 40.5 (*)    Bicarbonate 19.4 (*)    Acid-base deficit 7.0 (*)    All other components within normal limits  CULTURE, BLOOD (ROUTINE X 2)  CULTURE, BLOOD (ROUTINE X 2)  URINE CULTURE  CREATININE, URINE, RANDOM   SODIUM, URINE, RANDOM  LACTIC ACID, PLASMA  LACTIC ACID, PLASMA  PROCALCITONIN  PROTIME-INR  HIV ANTIBODY (ROUTINE TESTING)  HEMOGLOBIN A1C  LIPID PANEL  TROPONIN I  TROPONIN I  I-STAT TROPOININ, ED   EKG  EKG Interpretation  Date/Time:  Sunday August 30 2016 17:48:05 EST Ventricular Rate:  116 PR Interval:  188 QRS Duration: 98 QT Interval:  370 QTC Calculation: 514 R Axis:   52 Text Interpretation:  Sinus tachycardia Biatrial enlargement Left ventricular hypertrophy with repolarization abnormality Abnormal ECG SINCE LAST TRACING HEART RATE HAS INCREASED Confirmed by RAY MD, Andee Poles [phone]) on 08/30/2016 5:53:35 PM      Radiology Dg Elbow 2 Views Right  Result Date: 08/30/2016 CLINICAL DATA:  L bowl redness swelling and pain EXAM: RIGHT ELBOW - 2 VIEW COMPARISON:  None. FINDINGS: Examination is limited by a positioning. No gross fracture dislocation. Large spur of the olecranon. Diffuse soft tissue swelling. Probable joint effusion although limited assessment due to positioning. IMPRESSION: 1. Nonstandard positioning limits the exam 2. No gross fracture dislocation 3. Soft tissue swelling.  Possible joint effusion Electronically Signed   By: Donavan Foil M.D.   On: 08/30/2016 22:52   Dg Chest Portable 1 View  Result Date: 08/30/2016 CLINICAL DATA:  Weakness and shortness of breath with chest pain EXAM: PORTABLE CHEST 1 VIEW COMPARISON:  04/08/2015 FINDINGS: Mild patchy atelectasis at the left lung base. No effusion. Stable cardiomediastinal silhouette with atherosclerosis. No pneumothorax. Multiple metallic densities over the neck and upper chest. IMPRESSION: Mild patchy atelectasis at the left lung base. Atherosclerotic vascular disease of the aorta Electronically Signed   By: Donavan Foil M.D.   On: 08/30/2016 18:42    Procedures Procedures (including critical care time)  Medications Ordered in ED Medications  amLODipine (NORVASC) tablet 10 mg (not administered)   aspirin EC tablet 81 mg (81 mg Oral Not Given 08/30/16 2108)  metoprolol succinate (TOPROL-XL)  24 hr tablet 100 mg (not administered)  multivitamin with minerals tablet 1 tablet (1 tablet Oral Not Given 08/30/16 2109)  thiamine (VITAMIN B-1) tablet 100 mg (100 mg Oral Not Given 08/30/16 2109)  oxyCODONE-acetaminophen (PERCOCET/ROXICET) 5-325 MG per tablet 1 tablet (1 tablet Oral Given 08/30/16 2219)  acetaminophen (TYLENOL) tablet 650 mg (not administered)  ondansetron (ZOFRAN) injection 4 mg (not administered)  enoxaparin (LOVENOX) injection 40 mg (40 mg Subcutaneous Given 08/30/16 2131)  zolpidem (AMBIEN) tablet 5 mg (5 mg Oral Given 08/30/16 2219)  albuterol (PROVENTIL) (2.5 MG/3ML) 0.083% nebulizer solution 2.5 mg (not administered)  insulin aspart (novoLOG) injection 0-9 Units (not administered)  insulin aspart (novoLOG) injection 0-5 Units (3 Units Subcutaneous Given 08/30/16 2213)  insulin glargine (LANTUS) injection 3 Units (3 Units Subcutaneous Given 08/30/16 2212)  piperacillin-tazobactam (ZOSYN) IVPB 3.375 g (not administered)  vancomycin (VANCOCIN) IVPB 750 mg/150 ml premix (not administered)  iopamidol (ISOVUE-370) 76 % injection (not administered)  piperacillin-tazobactam (ZOSYN) IVPB 3.375 g (0 g Intravenous Stopped 08/30/16 1945)  vancomycin (VANCOCIN) IVPB 1000 mg/200 mL premix (0 mg Intravenous Stopped 08/30/16 2008)  sodium chloride 0.9 % bolus 1,000 mL (0 mLs Intravenous Stopped 08/30/16 2028)  sodium chloride 0.9 % bolus 1,500 mL (1,500 mLs Intravenous New Bag/Given 08/30/16 2111)     Initial Impression / Assessment and Plan / ED Course  I have reviewed the triage vital signs and the nursing notes.  Pertinent labs & imaging results that were available during my care of the patient were reviewed by me and considered in my medical decision making (see chart for details).  Clinical Course   71 y.o. male with above stated PMHx, HPI, and physical. History as above.  Concern for  sepsis. Blood & urine cultures obtained, Given IVF's. [facility] [name] on broad spectrum abx with Vancomycin/Zosyn. WBC 25. LA 5. Chest x-ray with patchiness at left lung base, atelectasis versus pneumonia. UA with rare bacteria but negative nitrite and no leuks - doubt UTI. R elbow cellulitis possible source.  Laboratory and imaging results were personally reviewed by myself and used in the medical decision making of this patient's treatment and disposition.  Pt admitted to medicine for further evaluation and management of sepsis. Pt understands and agrees with the plan and has no further questions or concerns.   Pt care discussed with and followed by my attending, Dr. Clarene Critchley, MD Pager [phone]   Final Clinical Impressions(s) / ED Diagnoses  Sepsis Leukocytosis AKI Hyponatremia Hypochloremia  New Prescriptions Current Discharge Medication List       Mayer Camel, MD 08/31/16 [id number]    Pattricia Boss, MD 09/03/16 1332

## 2016-08-30 NOTE — ED Notes (Signed)
[phone] Libra (son)   [phone] Barbaraann Share) neighbor  Please call with info  [phone] Jeanett Schlein (daughter)

## 2016-08-30 NOTE — Progress Notes (Signed)
Pharmacy Antibiotic Note  Andrew [name] is a 71 y.o. male admitted on 08/30/2016 with sepsis.  Pharmacy has been consulted for Vancomycin / Zosyn dosing.  Tolerated Zosyn in the ER  Plan: Zosyn 3.375 grams iv Q 8 hours - 4 hr infusion Vancomycin 750 mg iv Q 12 hours     Temp (24hrs), Avg:98.4 F (36.9 C), Min:97.7 F (36.5 C), Max:99.1 F (37.3 C)   Recent Labs Lab 08/30/16 1754 08/30/16 1804  WBC 24.8*  --   CREATININE 1.28*  --   LATICACIDVEN  --  4.85*    CrCl cannot be calculated (Unknown ideal weight.).    Allergies  Allergen Reactions  . Penicillins Rash and Other (See Comments)    Possibly rash?     Thank you for allowing pharmacy to be a part of this patient's care.  Tad Moore 08/30/2016 8:57 PM

## 2016-08-30 NOTE — H&P (Signed)
History and Physical    Andrew [name] BDZ:[id number] DOB: 02-24-1946 DOA: 08/30/2016  Referring MD/NP/PA:   PCP: Woody Seller, MD   Patient coming from:  The patient is coming from home.  At baseline, pt is independent for most of ADL.   Chief Complaint: chest pain, SOB, right elbow pain  HPI: Andrew [name] is a 71 y.o. male with medical history significant of hypertension, hyperlipidemia, diabetes mellitus, anxiety, AAA premises 4.8 cm on MRI 04/09/15), right eye blindness, dCHF, who presents with chest pain, SOB and right elbow pain.  Patient states that he has been having chest pain, shortness breath and generalized weakness for 2 days. His chest pain is located in the substernal area, moderate, nonradiating, pressure-like pain. It is associated with shortness of breath. No cough, fever or chills. No runny nose or sore throat. Patient also has right elbow pain, swelling and redness in the past several days. Patient denies nausea, vomiting, diarrhea, abdominal pain, symptoms of UTI or unilateral weakness.  ED Course: pt was found to have WBC 24.8, lactate of 4.85, troponin 0.07, acute renal injury with Cre 1.28, hyponatremia with sodium 129, which is corrected to 133, bicarbonate 17, AG 20, blood sugar 279. X-ray of the right elbow is negative for bony fracture. Chest x-ray showed patchy atelectasis in the left base without infiltration. D-dimer positive, but CT angiograms negative for PE or infiltration. Pt is admitted to tele bed as inpt.  # CTA of chest: 1. No CT evidence for acute pulmonary embolus. 2. No acute infiltrates.  Mild emphysematous disease. 3. 3 mm right upper lobe pulmonary nodule. No follow-up needed if patient is low-risk. Non-contrast chest CT can be considered in 12 months if patient is high-risk. This recommendation follows the consensus statement: Guidelines for Management of Incidental Pulmonary Nodules Detected on CT Images: From the Fleischner  Society 2017; Radiology 2017; 284:228-243. 4. Left adrenal gland appears slightly enlarged with possible nodules. Follow-up CT with adrenal protocol could be considered for further evaluation.      Review of Systems:   General: no fevers, chills, no changes in body weight, has fatigue HEENT: right eye blindness, No hearing changes or sore throat Respiratory: no dyspnea, coughing, wheezing CV: no chest pain, no palpitations GI: no nausea, vomiting, abdominal pain, diarrhea, constipation GU: no dysuria, burning on urination, increased urinary frequency, hematuria  Ext: no leg edema Neuro: no unilateral weakness, numbness, or tingling, no vision change or hearing loss Skin: right elbow is red, tender and erythematous. There is small skin tear in left elbow. MSK: No muscle spasm, no deformity, no limitation of range of movement in spin Heme: No easy bruising.  Travel history: No recent long distant travel.  Allergy:  Allergies  Allergen Reactions  . Penicillins Rash and Other (See Comments)    Possibly rash? Has patient had a PCN reaction causing immediate rash, facial/tongue/throat swelling, SOB or lightheadedness with hypotension: YES Has patient had a PCN reaction causing severe rash involving mucus membranes or skin necrosis:NO Has patient had a PCN reaction that required hospitalization NO Has patient had a PCN reaction occurring within the last 10 years:NO If all of the above answers are "NO", then may proceed with Cephalosporin use.    Past Medical History:  Diagnosis Date  . Blind right eye   . Diabetes mellitus without complication ([place])   . Hyperlipemia   . Hypertension   . 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 No  pertinent surgical history.  Social History:  reports that he has been smoking Cigarettes.  He has been smoking about 1.50 packs per day. He has never used smokeless tobacco. He reports that he drinks  about 16.8 oz of alcohol per week . His drug history is not on file.  Family History:  Family History  Problem Relation Age of Onset  . Diabetes Mellitus II Mother   . Diabetes Mellitus II Father   . Diabetes Mellitus II Sister   . Diabetes Mellitus II Sister      Prior to Admission medications   Medication Sig Start Date End Date Taking? Authorizing Provider  amLODipine (NORVASC) 10 MG tablet Take 1 tablet (10 mg total) by mouth daily. 04/15/15   Eugenie Filler, MD  aspirin EC 81 MG tablet Take 81 mg by mouth daily.    Historical Provider, MD  JANUVIA 100 MG tablet Take 100 mg by mouth daily. 02/07/15   Historical Provider, MD  LORazepam (ATIVAN) 1 MG tablet Take 1 tablet 30 minutes before MRI 04/18/15   Rolland Porter, MD  metFORMIN (GLUCOPHAGE) 500 MG tablet Take 1,000 mg by mouth 2 (two) times daily with a meal.    Historical Provider, MD  metoprolol succinate (TOPROL-XL) 100 MG 24 hr tablet Take 1 tablet (100 mg total) by mouth daily. 04/15/15   Eugenie Filler, MD  Multiple Vitamin (MULTIVITAMIN WITH MINERALS) TABS tablet Take 1 tablet by mouth daily. 04/15/15   Eugenie Filler, MD  thiamine 100 MG tablet Take 1 tablet (100 mg total) by mouth daily. 04/15/15   Eugenie Filler, MD  traMADol (ULTRAM) 50 MG tablet Take 1 tablet (50 mg total) by mouth every 6 (six) hours as needed for moderate pain. 04/15/15   Eugenie Filler, MD    Physical Exam: Vitals:   08/30/16 2116 08/30/16 2200 08/30/16 2237 08/31/16 0400  BP: 149/69 (!) 146/71  139/61  Pulse: 103 99  (!) 106  Resp: 18 20    Temp:  99.4 F (37.4 C)  99.1 F (37.3 C)  TempSrc:  Oral  Axillary  SpO2: 97% 98%  95%  Weight:  65.4 kg (144 lb 2.9 oz) 65.4 kg (144 lb 2.9 oz)   Height:   5' 8"  (1.727 m)    General: Not in acute distress HEENT:       Eyes: left eye PERRL, EOMI, no scleral icterus. Right eye blindness.       ENT: No discharge from the ears and nose, no pharynx injection, no tonsillar enlargement.        Neck:  No JVD, no bruit, no mass felt. Heme: No neck lymph node enlargement. Cardiac: S1/S2, RRR, No murmurs, No gallops or rubs. Respiratory: No rales, wheezing, rhonchi or rubs. GI: Soft, nondistended, nontender, no rebound pain, no organomegaly, BS present. GU: No hematuria Ext: No pitting leg edema bilaterally. 2+DP/PT pulse bilaterally. Musculoskeletal: No joint deformities, No joint redness or warmth, no limitation of ROM in spin. Skin: right elbow is red, tender and erythematous. There is small skin tear in left elbow. Neuro: Alert, oriented X3, cranial nerves II-XII grossly intact, moves all extremities normally.  Psych: Patient is not psychotic, no suicidal or hemocidal ideation.  Labs on Admission: I have personally reviewed following labs and imaging studies  CBC:  Recent Labs Lab 08/30/16 1754  WBC 24.8*  HGB 16.5  HCT 45.4  MCV 82.8  PLT 315   Basic Metabolic Panel:  Recent Labs Lab 08/30/16 1754  NA 129*  K 4.2  CL 92*  CO2 17*  GLUCOSE 279*  BUN 29*  CREATININE 1.28*  CALCIUM 9.9   GFR: Estimated Creatinine Clearance: 49 mL/min (by C-G formula based on SCr of 1.28 mg/dL (H)). Liver Function Tests: No results for input(s): AST, ALT, ALKPHOS, BILITOT, PROT, ALBUMIN in the last 168 hours. No results for input(s): LIPASE, AMYLASE in the last 168 hours. No results for input(s): AMMONIA in the last 168 hours. Coagulation Profile:  Recent Labs Lab 08/30/16 2216  INR 1.19   Cardiac Enzymes:  Recent Labs Lab 08/30/16 2216 08/31/16 0231  TROPONINI 0.07* 0.07*   BNP (last 3 results) No results for input(s): PROBNP in the last 8760 hours. HbA1C: No results for input(s): HGBA1C in the last 72 hours. CBG:  Recent Labs Lab 08/30/16 1824 08/30/16 2207  GLUCAP 303* 272*   Lipid Profile:  Recent Labs  08/31/16 0231  CHOL 108  HDL 44  LDLCALC 49  TRIG 75  CHOLHDL 2.5   Thyroid Function Tests: No results for input(s): TSH, T4TOTAL, FREET4,  T3FREE, THYROIDAB in the last 72 hours. Anemia Panel: No results for input(s): VITAMINB12, FOLATE, FERRITIN, TIBC, IRON, RETICCTPCT in the last 72 hours. Urine analysis:    Component Value Date/Time   COLORURINE YELLOW 08/30/2016 1926   APPEARANCEUR HAZY (A) 08/30/2016 1926   LABSPEC 1.020 08/30/2016 1926   PHURINE 5.0 08/30/2016 1926   GLUCOSEU >=500 (A) 08/30/2016 1926   HGBUR MODERATE (A) 08/30/2016 1926   BILIRUBINUR NEGATIVE 08/30/2016 1926   KETONESUR 20 (A) 08/30/2016 1926   PROTEINUR 100 (A) 08/30/2016 1926   UROBILINOGEN 0.2 04/18/2015 0210   NITRITE NEGATIVE 08/30/2016 1926   LEUKOCYTESUR NEGATIVE 08/30/2016 1926   Sepsis Labs: @LABRCNTIP (procalcitonin:4,lacticidven:4) )No results found for this or any previous visit (from the past 240 hour(s)).   Radiological Exams on Admission: Dg Elbow 2 Views Right  Result Date: 08/30/2016 CLINICAL DATA:  L bowl redness swelling and pain EXAM: RIGHT ELBOW - 2 VIEW COMPARISON:  None. FINDINGS: Examination is limited by a positioning. No gross fracture dislocation. Large spur of the olecranon. Diffuse soft tissue swelling. Probable joint effusion although limited assessment due to positioning. IMPRESSION: 1. Nonstandard positioning limits the exam 2. No gross fracture dislocation 3. Soft tissue swelling.  Possible joint effusion Electronically Signed   By: Donavan Foil M.D.   On: 08/30/2016 22:52   Ct Angio Chest Pe W Or Wo Contrast  Result Date: 08/31/2016 CLINICAL DATA:  Weakness shortness of breath and chest pain EXAM: CT ANGIOGRAPHY CHEST WITH CONTRAST TECHNIQUE: Multidetector CT imaging of the chest was performed using the standard protocol during bolus administration of intravenous contrast. Multiplanar CT image reconstructions and MIPs were obtained to evaluate the vascular anatomy. CONTRAST:  100 cc Isovue 370 intravenous COMPARISON:  Chest x-ray 08/30/1998 FINDINGS: Cardiovascular: Satisfactory opacification of the pulmonary  arteries to the segmental level. No evidence of pulmonary embolism. Extensive atherosclerotic calcifications within the aorta and great vessels. No aneurysm. Coronary artery calcifications. Heart size upper normal. No pericardial effusion. Mediastinum/Nodes: Trachea is midline. Thyroid unremarkable. Subcentimeter nonspecific mediastinal lymph nodes. Esophagus within normal limits. Lungs/Pleura: Mild emphysematous disease is present bilaterally. Tiny 3 mm nodule in the right upper lobe series 5, image number 32. No consolidation. No pneumothorax or pleural effusion. Dependent atelectasis. Upper Abdomen: No acute abnormality. Left adrenal gland appears enlarged with possible nodules. Musculoskeletal: Degenerative changes of the spine. No acute or suspicious bone lesions. Review of the MIP images confirms the above  findings. IMPRESSION: 1. No CT evidence for acute pulmonary embolus. 2. No acute infiltrates.  Mild emphysematous disease. 3. 3 mm right upper lobe pulmonary nodule. No follow-up needed if patient is low-risk. Non-contrast chest CT can be considered in 12 months if patient is high-risk. This recommendation follows the consensus statement: Guidelines for Management of Incidental Pulmonary Nodules Detected on CT Images: From the Fleischner Society 2017; Radiology 2017; 284:228-243. 4. Left adrenal gland appears slightly enlarged with possible nodules. Follow-up CT with adrenal protocol could be considered for further evaluation. Electronically Signed   By: Donavan Foil M.D.   On: 08/31/2016 03:06   Dg Chest Portable 1 View  Result Date: 08/30/2016 CLINICAL DATA:  Weakness and shortness of breath with chest pain EXAM: PORTABLE CHEST 1 VIEW COMPARISON:  04/08/2015 FINDINGS: Mild patchy atelectasis at the left lung base. No effusion. Stable cardiomediastinal silhouette with atherosclerosis. No pneumothorax. Multiple metallic densities over the neck and upper chest. IMPRESSION: Mild patchy atelectasis at the  left lung base. Atherosclerotic vascular disease of the aorta Electronically Signed   By: Donavan Foil M.D.   On: 08/30/2016 18:42     EKG: Independently reviewed.  Sinus rhythm, QTC 414, tachycardia, LVH, PAC, ? Multifocal atrial rhythm.   Assessment/Plan Principal Problem:   Chest pain Active Problems:   Alcohol abuse   Essential hypertension   Sepsis (HCC)   Cellulitis of right elbow   AKI (acute kidney injury) ([place])   Chronic diastolic CHF (congestive heart failure) (HCC)   Elevated troponin   Chest pain and positive trop: CT angiogram is negative for PE and infiltration. Patient has mild NSTEMI now.  - will admit to Tele bed for as inpt - cycle CE q6 x3 and repeat her EKG in the am  - Nitroglycerin, Morphine, and aspirin - start lipitor 40 mg daily - Risk factor stratification: will check FLP, and A1C  - 2d echo - please call Card in AM  Cellulitis in left elbow and sepsis: pt meets the criteria for sepsis with elevated lactate, leukocytosis, tachycardia and tachypnea. Currently hemodynamically stable. - Empiric antimicrobial treatment with vancomycin and Zosyn per pharmacy - PRN Zofran for nausea, Percocet for pain - Blood cultures x 2  - ESR and CRP - wound care consult - will get Procalcitonin and trend lactic acid levels per sepsis protocol. - IVF: 2.5 L of NS bolus  Chronic diastolic CHF (congestive heart failure): 2-D echo 04/09/15 showed EF of 65-70% with grade 1 diastolic dysfunction. Patient does not have leg edema or JVD. CHF seems to be compensated. BNP 292, indicating patient is at risk of developing CHF exacerbation. -Cannot start diuretics now, due to sepsis. -We'll monitor volume status closely -Continue metoprolol and aspirin  Alcohol abuse -in remission  Essential hypertension:  -Amlodipine, metoprolol, -Hold lisinopril due to AKI  Mild AKI: cre 1.28. Likely due to prerenal secondary to dehydration and continuation of ACEI - IVF as above -  Follow up renal function by BMP - Hold lisinopril   DVT ppx: SCD Code Status: Full code Family Communication: Yes, patient's son  at bed side Disposition Plan:  Anticipate discharge back to previous home environment Consults called:  none Admission status:  Inpatient/tele    Date of Service 08/31/2016    Ivor Costa [facility] Pager [phone]  If 7PM-7AM, please contact night-coverage [email or web address] Password [facility] 08/31/2016, 5:28 AM

## 2016-08-31 ENCOUNTER — Inpatient Hospital Stay (HOSPITAL_COMMUNITY): Payer: Medicare Other

## 2016-08-31 ENCOUNTER — Encounter (HOSPITAL_COMMUNITY): Payer: Self-pay | Admitting: Emergency Medicine

## 2016-08-31 ENCOUNTER — Other Ambulatory Visit (HOSPITAL_COMMUNITY): Payer: Medicare Other

## 2016-08-31 DIAGNOSIS — R7881 Bacteremia: Secondary | ICD-10-CM

## 2016-08-31 DIAGNOSIS — B9562 Methicillin resistant Staphylococcus aureus infection as the cause of diseases classified elsewhere: Secondary | ICD-10-CM

## 2016-08-31 DIAGNOSIS — R079 Chest pain, unspecified: Secondary | ICD-10-CM

## 2016-08-31 DIAGNOSIS — F1010 Alcohol abuse, uncomplicated: Secondary | ICD-10-CM

## 2016-08-31 DIAGNOSIS — L8990 Pressure ulcer of unspecified site, unspecified stage: Secondary | ICD-10-CM | POA: Insufficient documentation

## 2016-08-31 DIAGNOSIS — F17210 Nicotine dependence, cigarettes, uncomplicated: Secondary | ICD-10-CM

## 2016-08-31 DIAGNOSIS — Z880 Allergy status to penicillin: Secondary | ICD-10-CM

## 2016-08-31 DIAGNOSIS — I5032 Chronic diastolic (congestive) heart failure: Secondary | ICD-10-CM

## 2016-08-31 DIAGNOSIS — R778 Other specified abnormalities of plasma proteins: Secondary | ICD-10-CM | POA: Diagnosis present

## 2016-08-31 DIAGNOSIS — R748 Abnormal levels of other serum enzymes: Secondary | ICD-10-CM

## 2016-08-31 DIAGNOSIS — Z833 Family history of diabetes mellitus: Secondary | ICD-10-CM

## 2016-08-31 DIAGNOSIS — M71121 Other infective bursitis, right elbow: Secondary | ICD-10-CM

## 2016-08-31 DIAGNOSIS — R7989 Other specified abnormal findings of blood chemistry: Secondary | ICD-10-CM

## 2016-08-31 LAB — BLOOD CULTURE ID PANEL (REFLEXED)
ACINETOBACTER BAUMANNII: NOT DETECTED
Acinetobacter baumannii: NOT DETECTED
CANDIDA ALBICANS: NOT DETECTED
CANDIDA ALBICANS: NOT DETECTED
CANDIDA GLABRATA: NOT DETECTED
CANDIDA GLABRATA: NOT DETECTED
CANDIDA KRUSEI: NOT DETECTED
CANDIDA TROPICALIS: NOT DETECTED
Candida krusei: NOT DETECTED
Candida parapsilosis: NOT DETECTED
Candida parapsilosis: NOT DETECTED
Candida tropicalis: NOT DETECTED
ENTEROBACTER CLOACAE COMPLEX: NOT DETECTED
ENTEROBACTER CLOACAE COMPLEX: NOT DETECTED
ENTEROBACTERIACEAE SPECIES: NOT DETECTED
ENTEROCOCCUS SPECIES: NOT DETECTED
ESCHERICHIA COLI: NOT DETECTED
Enterobacteriaceae species: NOT DETECTED
Enterococcus species: NOT DETECTED
Escherichia coli: NOT DETECTED
Haemophilus influenzae: NOT DETECTED
Haemophilus influenzae: NOT DETECTED
KLEBSIELLA PNEUMONIAE: NOT DETECTED
Klebsiella oxytoca: NOT DETECTED
Klebsiella oxytoca: NOT DETECTED
Klebsiella pneumoniae: NOT DETECTED
LISTERIA MONOCYTOGENES: NOT DETECTED
LISTERIA MONOCYTOGENES: NOT DETECTED
Methicillin resistance: DETECTED — AB
Methicillin resistance: DETECTED — AB
NEISSERIA MENINGITIDIS: NOT DETECTED
Neisseria meningitidis: NOT DETECTED
PSEUDOMONAS AERUGINOSA: NOT DETECTED
Proteus species: NOT DETECTED
Proteus species: NOT DETECTED
Pseudomonas aeruginosa: NOT DETECTED
STAPHYLOCOCCUS SPECIES: DETECTED — AB
STREPTOCOCCUS AGALACTIAE: NOT DETECTED
STREPTOCOCCUS AGALACTIAE: NOT DETECTED
STREPTOCOCCUS PNEUMONIAE: NOT DETECTED
STREPTOCOCCUS PNEUMONIAE: NOT DETECTED
STREPTOCOCCUS PYOGENES: NOT DETECTED
STREPTOCOCCUS PYOGENES: NOT DETECTED
STREPTOCOCCUS SPECIES: NOT DETECTED
STREPTOCOCCUS SPECIES: NOT DETECTED
Serratia marcescens: NOT DETECTED
Serratia marcescens: NOT DETECTED
Staphylococcus aureus (BCID): DETECTED — AB
Staphylococcus aureus (BCID): DETECTED — AB
Staphylococcus species: DETECTED — AB

## 2016-08-31 LAB — CREATININE, URINE, RANDOM: Creatinine, Urine: 59.54 mg/dL

## 2016-08-31 LAB — ECHOCARDIOGRAM COMPLETE
Height: 68 in
WEIGHTICAEL: 2370.39 [oz_av]

## 2016-08-31 LAB — GLUCOSE, CAPILLARY
GLUCOSE-CAPILLARY: 37 mg/dL — AB (ref 65–99)
Glucose-Capillary: 92 mg/dL (ref 65–99)
Glucose-Capillary: 131 mg/dL — ABNORMAL HIGH (ref 65–99)
Glucose-Capillary: 44 mg/dL — CL (ref 65–99)
Glucose-Capillary: 168 mg/dL — ABNORMAL HIGH (ref 65–99)
Glucose-Capillary: 89 mg/dL (ref 65–99)

## 2016-08-31 LAB — LIPID PANEL
CHOL/HDL RATIO: 2.5 ratio
CHOLESTEROL: 108 mg/dL (ref 0–200)
HDL: 44 mg/dL (ref >40)
LDL Cholesterol: 49 mg/dL (ref 0–99)
Triglycerides: 75 mg/dL (ref <150)
VLDL: 15 mg/dL (ref 0–40)

## 2016-08-31 LAB — LACTIC ACID, PLASMA: LACTIC ACID, VENOUS: 1.2 mmol/L (ref 0.5–1.9)

## 2016-08-31 LAB — HIV ANTIBODY (ROUTINE TESTING W REFLEX): HIV Screen 4th Generation wRfx: NONREACTIVE

## 2016-08-31 LAB — SODIUM, URINE, RANDOM: Sodium, Ur: 22 mmol/L

## 2016-08-31 LAB — TROPONIN I
TROPONIN I: 0.21 ng/mL — AB (ref <0.03)
Troponin I: 0.07 ng/mL (ref <0.03)

## 2016-08-31 MED ORDER — GABAPENTIN 100 MG PO CAPS
100.0000 mg | ORAL_CAPSULE | Freq: Three times a day (TID) | ORAL | Status: DC
  Administered 2016-08-31 – 2016-09-07 (×21): 100 mg via ORAL
  Filled 2016-08-31 (×22): qty 1

## 2016-08-31 MED ORDER — DEXTROSE 50 % IV SOLN
1.0000 | Freq: Once | INTRAVENOUS | Status: AC
  Administered 2016-08-31: 25 mL via INTRAVENOUS

## 2016-08-31 MED ORDER — ATORVASTATIN CALCIUM 40 MG PO TABS
40.0000 mg | ORAL_TABLET | Freq: Every day | ORAL | Status: DC
  Administered 2016-08-31 – 2016-09-14 (×12): 40 mg via ORAL
  Filled 2016-08-31 (×16): qty 1

## 2016-08-31 MED ORDER — INSULIN GLARGINE 100 UNIT/ML ~~LOC~~ SOLN
40.0000 [IU] | Freq: Every day | SUBCUTANEOUS | Status: DC
  Administered 2016-08-31: 40 [IU] via SUBCUTANEOUS
  Filled 2016-08-31 (×2): qty 0.4

## 2016-08-31 MED ORDER — DEXTROSE-NACL 5-0.9 % IV SOLN
INTRAVENOUS | Status: DC
  Administered 2016-08-31: 18:00:00 via INTRAVENOUS

## 2016-08-31 MED ORDER — VANCOMYCIN HCL 500 MG IV SOLR
500.0000 mg | Freq: Two times a day (BID) | INTRAVENOUS | Status: DC
  Administered 2016-08-31 (×2): 500 mg via INTRAVENOUS
  Filled 2016-08-31 (×3): qty 500

## 2016-08-31 MED ORDER — NITROGLYCERIN 0.4 MG SL SUBL: 0.4000 mg | SUBLINGUAL_TABLET | SUBLINGUAL | Status: DC | PRN

## 2016-08-31 MED ORDER — ORAL CARE MOUTH RINSE
15.0000 mL | Freq: Two times a day (BID) | OROMUCOSAL | Status: DC
  Administered 2016-08-31 – 2016-09-10 (×15): 15 mL via OROMUCOSAL

## 2016-08-31 MED ORDER — DEXTROSE 50 % IV SOLN
INTRAVENOUS | Status: AC
  Administered 2016-08-31: 17:00:00
  Filled 2016-08-31: qty 50

## 2016-08-31 MED ORDER — ASPIRIN 325 MG PO TABS
325.0000 mg | ORAL_TABLET | Freq: Every day | ORAL | Status: DC
  Administered 2016-08-31 – 2016-09-15 (×15): 325 mg via ORAL
  Filled 2016-08-31 (×16): qty 1

## 2016-08-31 MED ORDER — ATORVASTATIN CALCIUM 40 MG PO TABS: 40.0000 mg | ORAL_TABLET | Freq: Every day | ORAL | Status: DC

## 2016-08-31 MED ORDER — MORPHINE SULFATE (PF) 2 MG/ML IV SOLN
2.0000 mg | INTRAVENOUS | Status: DC | PRN
  Administered 2016-09-06: 2 mg via INTRAVENOUS
  Filled 2016-08-31: qty 1

## 2016-08-31 MED ORDER — ACETAMINOPHEN 325 MG PO TABS
650.0000 mg | ORAL_TABLET | ORAL | Status: DC | PRN
  Administered 2016-09-03: 650 mg via ORAL
  Filled 2016-08-31: qty 2

## 2016-08-31 MED ORDER — MUPIROCIN CALCIUM 2 % EX CREA
TOPICAL_CREAM | Freq: Every day | CUTANEOUS | Status: DC
  Administered 2016-09-01 – 2016-09-15 (×15): via TOPICAL
  Filled 2016-08-31 (×3): qty 15

## 2016-08-31 MED ORDER — IOPAMIDOL (ISOVUE-370) INJECTION 76%
INTRAVENOUS | Status: AC
  Administered 2016-08-31: 100 mL
  Filled 2016-08-31: qty 100

## 2016-08-31 NOTE — Consult Note (Addendum)
[place] Nurse wound consult note from [phone] Reason for Consult: Consult requested for left elbow skin tears. Wound type: 3 full thickness abrasions Measurement: Dry scabbed area over the bony location of the left elbow; .3X.3cm, dark red and dry, no odor or drainage. Left outer elbow .5X2.5X.1cm and .8X.5X.1cm, both are red and moist with small amt pink drainage, no odor Dressing procedure/placement/frequency: Bactroban to provide antimicrobial benefits and promote moist healing, foam dressing to protect from further injury. No family present ot discuss plan of care and patient does not appear to understand.  Re-consult requested at 1415 to assess sacrum and left buttocks.  Inner gluteal fold and sacrum is red and moist; appearance consistent with moisture associated skin damage. Pt has a history of "boils" in the past, according to family members who discussed with the bedside nurse and stated pt had a previous wound to left buttocks prior to admission.  This location is red with intact skin; approx 1X1cm, appearance consistent with previous boil which has healed at this time.  There is no raised area, drainage, fluctuance, or pain.  Foam dressing to protect from further injury. No family at bedside to discuss plan of care.  Please re-consult if further assistance is needed.  Thank-you,  Julien Girt MSN, [place], [place], [place], [place]

## 2016-08-31 NOTE — Progress Notes (Addendum)
Patient ID: Andrew [name], male   DOB: 1946-01-03, 71 y.o.   MRN: [id number]    PROGRESS NOTE    Andrew [name]  GYF:[id number] DOB: Nov 02, 1945 DOA: 08/30/2016  PCP: Woody Seller, MD   Brief Narrative:  71 y.o. male with hypertension, hyperlipidemia, diabetes mellitus, anxiety, AAA premises 4.8 cm on MRI 04/09/15), right eye blindness, dCHF, who presented with two days duration of substernal and pressure like chest pain, SOB and right elbow pain.  ED Course: pt was found to have WBC 24.8, lactate of 4.85, troponin 0.07, acute renal injury with Cr 1.28, hyponatremia with sodium 129. Chest x-ray showed patchy atelectasis in the left base without infiltration. D-dimer positive, but CT angiograms negative for PE or infiltration.   # CTA of chest: 1. No CT evidence for acute pulmonary embolus. 2. No acute infiltrates. Mild emphysematous disease. 3. 3 mm right upper lobe pulmonary nodule.  4. Left adrenal gland enlarged with ? nodules. Follow-up CT with adrenal protocol for further evaluation.  Assessment & Plan:  Chest pain and positive trop - CT angiogram is negative for PE and infiltration - possible demands ischemia in the setting of sepsis but pt with underlying risk factors so will need monitoring  - initial troponin 0.07 --> 0.21 this AM - ECHO requested - cardiology consulted - keep on tele   Sepsis secondary to R olecranon bursitis, MRSA bacteremia  - pt met criteria for sepsis with elevated lactate, leukocytosis, tachycardia and tachypnea.  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 Likely due to prerenal secondary to dehydration, sepsis - continue to hold Lisinopril - BMP In AM  DVT prophylaxis: Lovenox SQ Code Status: Full  Family Communication: Patient at bedside, no family at bedside  Disposition Plan: to be determined   Consultants:   Cardiology   ID  Ortho   Procedures:   None  Antimicrobials:   Vancomycin 1/14 -->  Zosyn 1/14 --> 1/15   Subjective: Still with right elbow pain and intermittent chest pains.   Objective: Vitals:   08/31/16 0554 08/31/16 0812 08/31/16 1056 08/31/16 1129  BP:  124/70 (!) 124/50   Pulse:  (!) 105 96   Resp:  18 20   Temp:  98.3 F (36.8 C)  (!) 101.1 F (38.4 C)  TempSrc:  Axillary  Rectal  SpO2:  95% 93%   Weight: 67.2 kg (148 lb 2.4 oz)     Height:        Intake/Output Summary (Last 24 hours) at 08/31/16 1313 Last data filed at 08/31/16 1111  Gross per 24 hour  Intake  1450 ml  Output              600 ml  Net              850 ml   Filed Weights   08/30/16 2200 08/30/16 2237 08/31/16 0554  Weight: 65.4 kg (144 lb 2.9 oz) 65.4 kg (144 lb 2.9 oz) 67.2 kg (148 lb 2.4 oz)    Examination:  General exam: Appears lethargic but moving all 4 extremities  Respiratory system: diminished breath sounds at bases  Cardiovascular system: RRR. No JVD, murmurs, rubs, gallops or clicks. No pedal edema. Gastrointestinal system: Abdomen is nondistended, soft and nontender. No organomegaly or masses felt. Central nervous system: Right elbow  with erythema and TTP  Data Reviewed: I have personally reviewed following labs and imaging studies  CBC:  Recent Labs Lab 08/30/16 1754  WBC 24.8*  HGB 16.5  HCT 45.4  MCV 82.8  PLT 332   Basic Metabolic Panel:  Recent Labs Lab 08/30/16 1754  NA 129*  K 4.2  CL 92*  CO2 17*  GLUCOSE 279*  BUN 29*  CREATININE 1.28*  CALCIUM 9.9   Coagulation Profile:  Recent Labs Lab 08/30/16 2216  INR 1.19   Cardiac Enzymes:  Recent Labs Lab 08/30/16 2216 08/31/16 0231 08/31/16 0608  TROPONINI 0.07* 0.07* 0.21*   CBG:  Recent Labs Lab 08/30/16 1824 08/30/16 2207 08/31/16 0553 08/31/16 1135 08/31/16 1155  GLUCAP 303* 272* 92 44* 131*   Lipid Profile:  Recent Labs  08/31/16 0231  CHOL 108  HDL 44  LDLCALC 49  TRIG 75  CHOLHDL 2.5   Urine analysis:    Component Value Date/Time   COLORURINE YELLOW 08/30/2016 1926   APPEARANCEUR HAZY (A) 08/30/2016 1926   LABSPEC 1.020 08/30/2016 1926   PHURINE 5.0 08/30/2016 1926   GLUCOSEU >=500 (A) 08/30/2016 1926   HGBUR MODERATE (A) 08/30/2016 1926   BILIRUBINUR NEGATIVE 08/30/2016 1926   KETONESUR 20 (A) 08/30/2016 1926   PROTEINUR 100 (A) 08/30/2016 1926   UROBILINOGEN 0.2 04/18/2015 0210   NITRITE NEGATIVE 08/30/2016 1926   LEUKOCYTESUR NEGATIVE 08/30/2016 1926   Recent Results (from the past 240 hour(s))  Blood Culture (routine x 2)     Status: None (Preliminary result)   Collection Time: 08/30/16  6:48 PM  Result Value Ref Range Status   Specimen Description RIGHT ANTECUBITAL  Final   Special Requests BOTTLES DRAWN AEROBIC AND ANAEROBIC 5CC  Final   Culture  Setup Time   Final    GRAM POSITIVE COCCI IN CLUSTERS IN BOTH AEROBIC AND ANAEROBIC BOTTLES CRITICAL RESULT CALLED TO, READ BACK BY AND VERIFIED WITH: N. Batchelder Pharm.D. 11:35 08/31/16  ([email or web address])    Culture GRAM POSITIVE COCCI  Final   Report Status PENDING  Incomplete  Blood Culture ID Panel (Reflexed)     Status: Abnormal   Collection  Time: 08/30/16  6:48 PM  Result Value Ref Range Status   Enterococcus species NOT DETECTED NOT DETECTED Final   Listeria monocytogenes NOT DETECTED NOT DETECTED Final   Staphylococcus species DETECTED (A) NOT DETECTED Final    Comment: CRITICAL RESULT CALLED TO, READ BACK BY AND VERIFIED WITH: N. Batchelder Pharm.D. 11:35 08/31/16 ([email or web address])    Staphylococcus aureus DETECTED (A) NOT DETECTED Final    Comment: CRITICAL RESULT CALLED TO, READ BACK BY AND VERIFIED WITH: N. Batchelder Pharm.D. 11:35 08/31/16 ([email or web address])    Methicillin resistance DETECTED (A) NOT DETECTED Final    Comment: CRITICAL RESULT CALLED TO,  READ BACK BY AND VERIFIED WITH: N. Batchelder Pharm.D. 11:35 08/31/16 ([email or web address])    Streptococcus species NOT DETECTED NOT DETECTED Final   Streptococcus agalactiae NOT DETECTED NOT DETECTED Final   Streptococcus pneumoniae NOT DETECTED NOT DETECTED Final   Streptococcus pyogenes NOT DETECTED NOT DETECTED Final   Acinetobacter baumannii NOT DETECTED NOT DETECTED Final   Enterobacteriaceae species NOT DETECTED NOT DETECTED Final   Enterobacter cloacae complex NOT DETECTED NOT DETECTED Final   Escherichia coli NOT DETECTED NOT DETECTED Final   Klebsiella oxytoca NOT DETECTED NOT DETECTED Final   Klebsiella pneumoniae NOT DETECTED NOT DETECTED Final   Proteus species NOT DETECTED NOT DETECTED Final   Serratia marcescens NOT DETECTED NOT DETECTED Final   Haemophilus influenzae NOT DETECTED NOT DETECTED Final   Neisseria meningitidis NOT DETECTED NOT DETECTED Final   Pseudomonas aeruginosa NOT DETECTED NOT DETECTED Final   Candida albicans NOT DETECTED NOT DETECTED Final   Candida glabrata NOT DETECTED NOT DETECTED Final   Candida krusei NOT DETECTED NOT DETECTED Final   Candida parapsilosis NOT DETECTED NOT DETECTED Final   Candida tropicalis NOT DETECTED NOT DETECTED Final  Blood Culture (routine x 2)     Status: None (Preliminary result)   Collection Time: 08/30/16  6:59 PM    Result Value Ref Range Status   Specimen Description LEFT ANTECUBITAL  Final   Special Requests BOTTLES DRAWN AEROBIC AND ANAEROBIC  5CC  Final   Culture NO GROWTH < 24 HOURS  Final   Report Status PENDING  Incomplete      Radiology Studies: Dg Elbow 2 Views Right  Result Date: 08/30/2016 CLINICAL DATA:  L bowl redness swelling and pain EXAM: RIGHT ELBOW - 2 VIEW COMPARISON:  None. FINDINGS: Examination is limited by a positioning. No gross fracture dislocation. Large spur of the olecranon. Diffuse soft tissue swelling. Probable joint effusion although limited assessment due to positioning. IMPRESSION: 1. Nonstandard positioning limits the exam 2. No gross fracture dislocation 3. Soft tissue swelling.  Possible joint effusion Electronically Signed   By: Donavan Foil M.D.   On: 08/30/2016 22:52   Ct Angio Chest Pe W Or Wo Contrast  Result Date: 08/31/2016 CLINICAL DATA:  Weakness shortness of breath and chest pain EXAM: CT ANGIOGRAPHY CHEST WITH CONTRAST TECHNIQUE: Multidetector CT imaging of the chest was performed using the standard protocol during bolus administration of intravenous contrast. Multiplanar CT image reconstructions and MIPs were obtained to evaluate the vascular anatomy. CONTRAST:  100 cc Isovue 370 intravenous COMPARISON:  Chest x-ray 08/30/1998 FINDINGS: Cardiovascular: Satisfactory opacification of the pulmonary arteries to the segmental level. No evidence of pulmonary embolism. Extensive atherosclerotic calcifications within the aorta and great vessels. No aneurysm. Coronary artery calcifications. Heart size upper normal. No pericardial effusion. Mediastinum/Nodes: Trachea is midline. Thyroid unremarkable. Subcentimeter nonspecific mediastinal lymph nodes. Esophagus within normal limits. Lungs/Pleura: Mild emphysematous disease is present bilaterally. Tiny 3 mm nodule in the right upper lobe series 5, image number 32. No consolidation. No pneumothorax or pleural effusion.  Dependent atelectasis. Upper Abdomen: No acute abnormality. Left adrenal gland appears enlarged with possible nodules. Musculoskeletal: Degenerative changes of the spine. No acute or suspicious bone lesions. Review of the MIP images confirms the above findings. IMPRESSION: 1. No CT evidence for acute pulmonary embolus. 2. No acute infiltrates.  Mild emphysematous disease. 3. 3 mm right upper lobe pulmonary nodule. No follow-up needed if patient is low-risk. Non-contrast chest CT can be considered in 12 months if patient is high-risk. This recommendation follows the  consensus statement: Guidelines for Management of Incidental Pulmonary Nodules Detected on CT Images: From the Fleischner Society 2017; Radiology 2017; 284:228-243. 4. Left adrenal gland appears slightly enlarged with possible nodules. Follow-up CT with adrenal protocol could be considered for further evaluation. Electronically Signed   By: Donavan Foil M.D.   On: 08/31/2016 03:06   Dg Chest Portable 1 View  Result Date: 08/30/2016 CLINICAL DATA:  Weakness and shortness of breath with chest pain EXAM: PORTABLE CHEST 1 VIEW COMPARISON:  04/08/2015 FINDINGS: Mild patchy atelectasis at the left lung base. No effusion. Stable cardiomediastinal silhouette with atherosclerosis. No pneumothorax. Multiple metallic densities over the neck and upper chest. IMPRESSION: Mild patchy atelectasis at the left lung base. Atherosclerotic vascular disease of the aorta Electronically Signed   By: Donavan Foil M.D.   On: 08/30/2016 18:42      Scheduled Meds: . amLODipine  10 mg Oral Daily  . aspirin  325 mg Oral Daily  . atorvastatin  40 mg Oral q1800  . enoxaparin (LOVENOX) injection  40 mg Subcutaneous Q24H  . gabapentin  100 mg Oral TID  . insulin aspart  0-9 Units Subcutaneous TID WC  . insulin glargine  40 Units Subcutaneous Daily  . mouth rinse  15 mL Mouth Rinse BID  . metoprolol succinate  100 mg Oral Daily  . multivitamin with minerals  1 tablet  Oral Daily  . mupirocin cream   Topical Daily  . thiamine  100 mg Oral Daily  . vancomycin  500 mg Intravenous Q12H   Continuous Infusions:   LOS: 1 day   Time spent: 20 minutes   Faye Ramsay, MD [facility] Pager [phone]  If 7PM-7AM, please contact night-coverage [email or web address] Password [email or web address] 08/31/2016, 1:13 PM

## 2016-08-31 NOTE — Progress Notes (Signed)
  Echocardiogram 2D Echocardiogram has been performed.  Diamond Nickel 08/31/2016, 12:37 PM

## 2016-08-31 NOTE — Consult Note (Signed)
[facility]    Date of Admission:  08/30/2016           Day 2 vancomycin        Day 2 piperacillin tazobactam       Reason for Consult: Automatic consultation for staph aureus bacteremia     Principal Problem:   Bacteremia due to methicillin resistant Staphylococcus aureus Active Problems:   Septic olecranon bursitis of right elbow   Alcohol abuse   Essential hypertension   Sepsis ([place])   Chest pain   AKI (acute kidney injury) ([place])   Chronic diastolic CHF (congestive heart failure) ([facility])   Elevated troponin   Pressure injury of skin   . amLODipine  10 mg Oral Daily  . aspirin  325 mg Oral Daily  . atorvastatin  40 mg Oral q1800  . enoxaparin (LOVENOX) injection  40 mg Subcutaneous Q24H  . gabapentin  100 mg Oral TID  . insulin aspart  0-9 Units Subcutaneous TID WC  . insulin glargine  40 Units Subcutaneous Daily  . mouth rinse  15 mL Mouth Rinse BID  . metoprolol succinate  100 mg Oral Daily  . multivitamin with minerals  1 tablet Oral Daily  . mupirocin cream   Topical Daily  . thiamine  100 mg Oral Daily  . vancomycin  500 mg Intravenous Q12H    Recommendations: 1. Continue vancomycin 2. Discontinue piperacillin tazobactam 3. Await results of transthoracic echocardiogram 4. Repeat blood cultures ordered for tomorrow morning 5. Recommend orthopedic evaluation of right elbow   Assessment: He has septic olecranon bursitis of his right elbow complicated by MRSA bacteremia. I would recommend orthopedic evaluation for possible incision and drainage. A transthoracic echocardiogram has been performed and I will obtain repeat blood cultures in the morning.     HPI: Andrew [name] is a 71 y.o. male with a history of recurrent boils and MRSA bacteremia in 2016. He recently began to develop pain, redness and swelling of his right elbow followed by profound weakness leading to admission yesterday. One of 2 admission blood cultures  already growing MRSA.   Review of Systems: Review of Systems  Unable to perform ROS: Mental acuity    Past Medical History:  Diagnosis Date  . Blind right eye   . Diabetes mellitus without complication ([place])   . Hyperlipemia   . Hypertension   . 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 Smoking status: Current Every Day Smoker    Packs/day: 1.50    Types: Cigarettes  . Smokeless tobacco: Never Used  . Alcohol use 16.8 oz/week    28 Cans of beer per week    Family History  Problem Relation Age of Onset  . Diabetes Mellitus II Mother   . Diabetes Mellitus II Father   . Diabetes Mellitus II Sister   . Diabetes Mellitus II Sister    Allergies  Allergen Reactions  . Penicillins Rash and Other (See Comments)    Possibly rash? Has patient had a PCN reaction causing immediate rash, facial/tongue/throat swelling, SOB or lightheadedness with hypotension: YES Has patient had a PCN reaction causing severe rash involving mucus membranes or skin necrosis:NO Has patient had a PCN reaction that required hospitalization NO Has patient had a PCN reaction occurring within the last 10 years:NO If all of the above answers are "NO", then may proceed with Cephalosporin  use.    OBJECTIVE: Blood pressure (!) 124/50, pulse 96, temperature (!) 101.1 F (38.4 C), temperature source Rectal, resp. rate 20, height 5\' 8"  (1.727 m), weight 148 lb 2.4 oz (67.2 kg), SpO2 93 %.  Physical Exam  Constitutional:  He is very lethargic and does not answer questions. His son and a close neighbor are at the bedside.  HENT:  Poor dentition.  Eyes: Conjunctivae are normal.  Neck: Neck supple.  Cardiovascular: Normal rate and regular rhythm.   Murmur heard. Early 2/6 systolic murmur.  Pulmonary/Chest: Effort normal and breath sounds normal. He has no wheezes. He has no rales.  Abdominal: Soft. There is tenderness.  Musculoskeletal:    There is swelling, redness and fluctuance of the right olecranon bursa with surrounding soft tissue swelling and cellulitis.  His right thumb is surgically absent.  Skin:  There is a superficial area of redness on his left buttock primarily pressure injury. He has some healed scars on his sacrum from previous boils.    Lab Results Lab Results  Component Value Date   WBC 24.8 (H) 08/30/2016   HGB 16.5 08/30/2016   HCT 45.4 08/30/2016   MCV 82.8 08/30/2016   PLT 309 08/30/2016    Lab Results  Component Value Date   CREATININE 1.28 (H) 08/30/2016   BUN 29 (H) 08/30/2016   NA 129 (L) 08/30/2016   K 4.2 08/30/2016   CL 92 (L) 08/30/2016   CO2 17 (L) 08/30/2016    Lab Results  Component Value Date   ALT 33 04/18/2015   AST 28 04/18/2015   ALKPHOS 183 (H) 04/18/2015   BILITOT 0.7 04/18/2015     Microbiology: Recent Results (from the past 240 hour(s))  Blood Culture (routine x 2)     Status: None (Preliminary result)   Collection Time: 08/30/16  6:48 PM  Result Value Ref Range Status   Specimen Description RIGHT ANTECUBITAL  Final   Special Requests BOTTLES DRAWN AEROBIC AND ANAEROBIC 5CC  Final   Culture  Setup Time   Final    GRAM POSITIVE COCCI IN CLUSTERS IN BOTH AEROBIC AND ANAEROBIC BOTTLES CRITICAL RESULT CALLED TO, READ BACK BY AND VERIFIED WITH: N. Batchelder Pharm.D. 11:35 08/31/16  ([email or web address])    Culture GRAM POSITIVE COCCI  Final   Report Status PENDING  Incomplete  Blood Culture ID Panel (Reflexed)     Status: Abnormal   Collection Time: 08/30/16  6:48 PM  Result Value Ref Range Status   Enterococcus species NOT DETECTED NOT DETECTED Final   Listeria monocytogenes NOT DETECTED NOT DETECTED Final   Staphylococcus species DETECTED (A) NOT DETECTED Final    Comment: CRITICAL RESULT CALLED TO, READ BACK BY AND VERIFIED WITH: N. Batchelder Pharm.D. 11:35 08/31/16 ([email or web address])    Staphylococcus aureus DETECTED (A) NOT DETECTED Final    Comment: CRITICAL RESULT  CALLED TO, READ BACK BY AND VERIFIED WITH: N. Batchelder Pharm.D. 11:35 08/31/16 ([email or web address])    Methicillin resistance DETECTED (A) NOT DETECTED Final    Comment: CRITICAL RESULT CALLED TO, READ BACK BY AND VERIFIED WITH: N. Batchelder Pharm.D. 11:35 08/31/16 ([email or web address])    Streptococcus species NOT DETECTED NOT DETECTED Final   Streptococcus agalactiae NOT DETECTED NOT DETECTED Final   Streptococcus pneumoniae NOT DETECTED NOT DETECTED Final   Streptococcus pyogenes NOT DETECTED NOT DETECTED Final   Acinetobacter baumannii NOT DETECTED NOT DETECTED Final   Enterobacteriaceae species NOT DETECTED NOT DETECTED Final   Enterobacter cloacae complex NOT DETECTED NOT DETECTED Final  Escherichia coli NOT DETECTED NOT DETECTED Final   Klebsiella oxytoca NOT DETECTED NOT DETECTED Final   Klebsiella pneumoniae NOT DETECTED NOT DETECTED Final   Proteus species NOT DETECTED NOT DETECTED Final   Serratia marcescens NOT DETECTED NOT DETECTED Final   Haemophilus influenzae NOT DETECTED NOT DETECTED Final   Neisseria meningitidis NOT DETECTED NOT DETECTED Final   Pseudomonas aeruginosa NOT DETECTED NOT DETECTED Final   Candida albicans NOT DETECTED NOT DETECTED Final   Candida glabrata NOT DETECTED NOT DETECTED Final   Candida krusei NOT DETECTED NOT DETECTED Final   Candida parapsilosis NOT DETECTED NOT DETECTED Final   Candida tropicalis NOT DETECTED NOT DETECTED Final  Blood Culture (routine x 2)     Status: None (Preliminary result)   Collection Time: 08/30/16  6:59 PM  Result Value Ref Range Status   Specimen Description LEFT ANTECUBITAL  Final   Special Requests BOTTLES DRAWN AEROBIC AND ANAEROBIC  5CC  Final   Culture NO GROWTH < 24 HOURS  Final   Report Status PENDING  Incomplete    Michel Bickers, MD [facility] [phone] pager   [phone] cell 08/31/2016, 1:09 PM

## 2016-08-31 NOTE — Progress Notes (Signed)
Pharmacy Antibiotic Note  Andrew [name] is a 71 y.o. male admitted on 08/30/2016 with sepsis and right elbow cellulitis.  Pharmacy has been consulted for vancomycin and Zosyn dosing.  Patient has reduced renal clearance.  SCr currently at 1.28 and his baseline is around 0.5-0.6.   Plan: - Change vanc to 500mg  IV Q12H - Continue Zosyn 3.375gm IV Q8H, 4 hr infusion - Monitor renal fxn, micro data, vanc trough prior to 4th dose - Watch CBGs  Height: 5\' 8"  (172.7 cm) Weight: 148 lb 2.4 oz (67.2 kg) (bed scale) IBW/kg (Calculated) : 68.4  Temp (24hrs), Avg:98.8 F (37.1 C), Min:97.7 F (36.5 C), Max:99.4 F (37.4 C)   Recent Labs Lab 08/30/16 1754 08/30/16 1804 08/30/16 2106 08/31/16 0006  WBC 24.8*  --   --   --   CREATININE 1.28*  --   --   --   LATICACIDVEN  --  4.85* 1.5 1.2    Estimated Creatinine Clearance: 50.3 mL/min (by C-G formula based on SCr of 1.28 mg/dL (H)).    Allergies  Allergen Reactions  . Penicillins Rash and Other (See Comments)    Possibly rash? Has patient had a PCN reaction causing immediate rash, facial/tongue/throat swelling, SOB or lightheadedness with hypotension: YES Has patient had a PCN reaction causing severe rash involving mucus membranes or skin necrosis:NO Has patient had a PCN reaction that required hospitalization NO Has patient had a PCN reaction occurring within the last 10 years:NO If all of the above answers are "NO", then may proceed with Cephalosporin use.    Antimicrobials this admission:  Vanc 1/14 >> Zosyn 1/14 >>  Dose adjustments this admission:  N/A  Microbiology results:  1/14 BCx x2 -  1/14 UCx -    [name] D. Mina Marble, PharmD, BCPS Pager:  [phone] 08/31/2016, 7:38 AM

## 2016-08-31 NOTE — Progress Notes (Signed)
PHARMACY - PHYSICIAN COMMUNICATION CRITICAL VALUE ALERT - BLOOD CULTURE IDENTIFICATION (BCID)  Results for orders placed or performed during the hospital encounter of 08/30/16  Blood Culture ID Panel (Reflexed) (Collected: 08/30/2016  6:59 PM)  Result Value Ref Range   Enterococcus species NOT DETECTED NOT DETECTED   Listeria monocytogenes NOT DETECTED NOT DETECTED   Staphylococcus species DETECTED (A) NOT DETECTED   Staphylococcus aureus DETECTED (A) NOT DETECTED   Methicillin resistance DETECTED (A) NOT DETECTED   Streptococcus species NOT DETECTED NOT DETECTED   Streptococcus agalactiae NOT DETECTED NOT DETECTED   Streptococcus pneumoniae NOT DETECTED NOT DETECTED   Streptococcus pyogenes NOT DETECTED NOT DETECTED   Acinetobacter baumannii NOT DETECTED NOT DETECTED   Enterobacteriaceae species NOT DETECTED NOT DETECTED   Enterobacter cloacae complex NOT DETECTED NOT DETECTED   Escherichia coli NOT DETECTED NOT DETECTED   Klebsiella oxytoca NOT DETECTED NOT DETECTED   Klebsiella pneumoniae NOT DETECTED NOT DETECTED   Proteus species NOT DETECTED NOT DETECTED   Serratia marcescens NOT DETECTED NOT DETECTED   Haemophilus influenzae NOT DETECTED NOT DETECTED   Neisseria meningitidis NOT DETECTED NOT DETECTED   Pseudomonas aeruginosa NOT DETECTED NOT DETECTED   Candida albicans NOT DETECTED NOT DETECTED   Candida glabrata NOT DETECTED NOT DETECTED   Candida krusei NOT DETECTED NOT DETECTED   Candida parapsilosis NOT DETECTED NOT DETECTED   Candida tropicalis NOT DETECTED NOT DETECTED    Name of physician (or Provider) Contacted: BCID for other BCx resulted earlier in the day and shows the same  Changes to prescribed antibiotics required: on vancomycin, no change needed  Renold Genta, PharmD, BCPS Clinical Pharmacist Phone for tonight - [place] - [phone] 08/31/2016 10:27 PM

## 2016-08-31 NOTE — Progress Notes (Signed)
Inpatient Diabetes Program Recommendations  AACE/ADA: New Consensus Statement on Inpatient Glycemic Control (2015)  Target Ranges:  Prepandial:   less than 140 mg/dL      Peak postprandial:   less than 180 mg/dL (1-2 hours)      Critically ill patients:  140 - 180 mg/dL   Lab Results  Component Value Date   GLUCAP 131 (H) 08/31/2016   HGBA1C 13.8 (H) 04/09/2015    Review of Glycemic Control  Diabetes history: DM2 Outpatient Diabetes medications: Toujeo 60 units QD, metformin 1000 mg bid, Januvia 100 mg QD Current orders for Inpatient glycemic control: Lantus 40 units QD, Novolog 0-9 units tidwc  Low blood sugar this am likely d/t poor po intake and pt took his usual dose of Toujeo 60 units prior to being admitted. HgbA1C pending  Inpatient Diabetes Program Recommendations:    Decrease Lantus to 30 units QD. Change Novolog 0-9 units to Q4H while NPO.  Will continue to follow. Thank you. Lorenda Peck, RD, LDN, CDE Inpatient Diabetes Coordinator [phone]

## 2016-08-31 NOTE — Progress Notes (Signed)
Dr. Doyle Askew sent text page with hypoglycemic results of 44.  CBG up to 131 after 1/2 amp D50% and Temp = 101.1 Rectally.

## 2016-08-31 NOTE — Progress Notes (Addendum)
Received phone call from lab. Critical value troponin=0.07. VSS. Denies chest pain or distress. On call MD Hugelmeyer paged with value. Waiting for futher orders.

## 2016-08-31 NOTE — Progress Notes (Signed)
2-D echocardiogram performed at bedside today.  Family Andrew [name], son)  and caregiver/neighbor at bedside and updated on plan.  Dr. Megan Salon in for ID consult and discussed antibiotic and blood culture results with son and caregiver at bedside.

## 2016-08-31 NOTE — Progress Notes (Signed)
No independent void today and mid-day bladder scan showed urinary retention.  Dr. Doyle Askew notified and foley ordered and inserted.   Dr. Lorin Mercy, Orthopedic MD in and attempted to drain left elbow after local anesthesia.  No fluid obtained.  MD placed bandaid and gauze taped over bandaid.  Gauze dressing remains clean, dry, and intact.

## 2016-08-31 NOTE — Progress Notes (Signed)
Pt's second troponin came back critically high = 0.07 which is the same as the last one.pt remains asymptomatic. VSS. On call MD Hugelmeyer paged with value. Waiting for further orders

## 2016-08-31 NOTE — Progress Notes (Signed)
  PHARMACY - PHYSICIAN COMMUNICATION CRITICAL VALUE ALERT - BLOOD CULTURE IDENTIFICATION (BCID)  Results for orders placed or performed during the hospital encounter of 08/30/16  Blood Culture ID Panel (Reflexed) (Collected: 08/30/2016  6:48 PM)  Result Value Ref Range   Enterococcus species NOT DETECTED NOT DETECTED   Listeria monocytogenes NOT DETECTED NOT DETECTED   Staphylococcus species DETECTED (A) NOT DETECTED   Staphylococcus aureus DETECTED (A) NOT DETECTED   Methicillin resistance DETECTED (A) NOT DETECTED   Streptococcus species NOT DETECTED NOT DETECTED   Streptococcus agalactiae NOT DETECTED NOT DETECTED   Streptococcus pneumoniae NOT DETECTED NOT DETECTED   Streptococcus pyogenes NOT DETECTED NOT DETECTED   Acinetobacter baumannii NOT DETECTED NOT DETECTED   Enterobacteriaceae species NOT DETECTED NOT DETECTED   Enterobacter cloacae complex NOT DETECTED NOT DETECTED   Escherichia coli NOT DETECTED NOT DETECTED   Klebsiella oxytoca NOT DETECTED NOT DETECTED   Klebsiella pneumoniae NOT DETECTED NOT DETECTED   Proteus species NOT DETECTED NOT DETECTED   Serratia marcescens NOT DETECTED NOT DETECTED   Haemophilus influenzae NOT DETECTED NOT DETECTED   Neisseria meningitidis NOT DETECTED NOT DETECTED   Pseudomonas aeruginosa NOT DETECTED NOT DETECTED   Candida albicans NOT DETECTED NOT DETECTED   Candida glabrata NOT DETECTED NOT DETECTED   Candida krusei NOT DETECTED NOT DETECTED   Candida parapsilosis NOT DETECTED NOT DETECTED   Candida tropicalis NOT DETECTED NOT DETECTED    Name of physician (or Provider) Contacted: I. Doyle Askew. No response  Came in septic with R elbow cellulitis. Blood cx with GPC in clusters. BCID reports as MRSA. Will need endocarditis work up. ID will be automatically consulted.  Changes to prescribed antibiotics required: Consider stopping Zosyn and continue vancomycin  Elenor Quinones, PharmD, [facility] Clinical Pharmacist Pager  [phone] 08/31/2016 11:43 AM

## 2016-08-31 NOTE — Progress Notes (Signed)
Physician notified: Doyle Askew At: 1432  Regarding: FYI second I+O cath to be performed per protocol. Bladder scan >442ml.

## 2016-08-31 NOTE — Consult Note (Signed)
CARDIOLOGY CONSULT NOTE   Patient ID: Andrew [name] MRN: [id number] DOB/AGE: [birth year] 71 y.o.  Admit date: 08/30/2016  Requesting Physician: Dr. Doyle Askew  Primary Physician:   Woody Seller, MD Primary Cardiologist: New Reason for Consultation: chest pain/ elevated troponin  HPI: Andrew [name] is a 71 y.o. male with a history of HTN, HLD, DMT2, AAA (4.8cm on MRI 03/2015), right eye blindness, chronic diastolic CHF, tobacco abuse and alcohol abuse who presented to [facility] on 08/30/16 with chest pain and right elbow pain. Found to have septic olecranon bursitis of his right elbow complicated by MRSA bacteremia. Cardiology consulted for chest pain and elevated troponin.   Admitted in 03/2015 for staph bacteremia and DKA. 2D ECHO showed normal LV function, G1DD and mild AS. ID recommended TEE to rule out vegetations. Cardiology was asked to assess patient for TEE however patient refused due to financial concerns. He was empirically treated for endocarditis with IV Abx x3 weeks. An incidental 4.8 cm saccular infrarenal AAA was noted on MRI.   He was in his usual state of health until ~08/28/16 when he started having chest pain, SOB and generalized weakness as well as right elbow pain. He sought care in the ER on 08/30/16.   ED Course: pt was found to have WBC 24.8, lactate of 4.85, troponin 0.07, acute renal injury with Cre 1.28, hyponatremia with sodium 129, which is corrected to 133, bicarbonate 17, AG 20, blood sugar 279. X-ray of the right elbow is negative for bony fracture. Chest x-ray showed patchy atelectasis in the left base without infiltration. D-dimer positive, but CT angiograms negative for PE or infiltration. ECG showed sinus tach HR 116 with inferolateral ST depression vs LVH with repol abnormality.   He was found to have septic olecranon bursitis of his right elbow complicated by MRSA bacteremia. ID recommended orthopedic evaluation for possible incision and drainage.  Currently he is febrile with a temp of 101 and tachycardic. He will not wake up to answer my questions therefore no history can be otained.   Past Medical History:  Diagnosis Date  . Blind right eye   . Diabetes mellitus without complication ([place])   . Hyperlipemia   . Hypertension   . Saccular aneurysm: 4.8cm infrarenal AAA per MRI (04/09/2015) 04/12/2015     History reviewed. No pertinent surgical history.  Allergies  Allergen Reactions  . Penicillins Rash and Other (See Comments)    Possibly rash? Has patient had a PCN reaction causing immediate rash, facial/tongue/throat swelling, SOB or lightheadedness with hypotension: YES Has patient had a PCN reaction causing severe rash involving mucus membranes or skin necrosis:NO Has patient had a PCN reaction that required hospitalization NO Has patient had a PCN reaction occurring within the last 10 years:NO If all of the above answers are "NO", then may proceed with Cephalosporin use.    I have reviewed the patient's current medications . amLODipine  10 mg Oral Daily  . aspirin  325 mg Oral Daily  . atorvastatin  40 mg Oral q1800  . enoxaparin (LOVENOX) injection  40 mg Subcutaneous Q24H  . gabapentin  100 mg Oral TID  . insulin aspart  0-9 Units Subcutaneous TID WC  . insulin glargine  40 Units Subcutaneous Daily  . mouth rinse  15 mL Mouth Rinse BID  . metoprolol succinate  100 mg Oral Daily  . multivitamin with minerals  1 tablet Oral Daily  . mupirocin cream   Topical Daily  . thiamine  100 mg Oral Daily  . vancomycin  500 mg Intravenous Q12H    acetaminophen, albuterol, morphine injection, nitroGLYCERIN, ondansetron (ZOFRAN) IV, oxyCODONE-acetaminophen, zolpidem  Prior to Admission medications   Medication Sig Start Date End Date Taking? Authorizing Provider  amLODipine (NORVASC) 10 MG tablet Take 1 tablet (10 mg total) by mouth daily. 04/15/15  Yes Eugenie Filler, MD  aspirin 325 MG tablet Take 325 mg by mouth daily.    Yes Historical Provider, MD  gabapentin (NEURONTIN) 100 MG capsule Take 100 mg by mouth 3 (three) times daily.   Yes Historical Provider, MD  Insulin Glargine (TOUJEO SOLOSTAR) 300 UNIT/ML SOPN Inject 60 units of lipase into the skin daily. Per sliding scale   Yes Historical Provider, MD  JANUVIA 100 MG tablet Take 100 mg by mouth daily. 02/07/15  Yes Historical Provider, MD  lisinopril (PRINIVIL,ZESTRIL) 10 MG tablet Take 10 mg by mouth daily.   Yes Historical Provider, MD  metFORMIN (GLUCOPHAGE) 500 MG tablet Take 1,000 mg by mouth 2 (two) times daily with a meal.   Yes Historical Provider, MD  metoprolol succinate (TOPROL-XL) 50 MG 24 hr tablet Take 50 mg by mouth daily. Take with or immediately following a meal.   Yes Historical Provider, MD  simvastatin (ZOCOR) 40 MG tablet Take 40 mg by mouth daily.   Yes Historical Provider, MD     Social History   Social History  . Marital status: Single    Spouse name: N/A  . Number of children: N/A  . Years of education: N/A   Occupational History  . Not on file.   Social History Main Topics  . Smoking status: Current Every Day Smoker    Packs/day: 1.50    Types: Cigarettes  . Smokeless tobacco: Never Used  . Alcohol use 16.8 oz/week    28 Cans of beer per week  . Drug use: Unknown  . Sexual activity: Not on file   Other Topics Concern  . Not on file   Social History Narrative  . No narrative on file    Family Status  Relation Status  . Mother Deceased  . Father Deceased  . Sister Alive  . Sister Alive   Family History  Problem Relation Age of Onset  . Diabetes Mellitus II Mother   . Diabetes Mellitus II Father   . Diabetes Mellitus II Sister   . Diabetes Mellitus II Sister     ROS:  Full 14 point review of systems complete and found to be negative unless listed above.  Physical Exam: Blood pressure (!) 124/50, pulse 96, temperature (!) 101.1 F (38.4 C), temperature source Rectal, resp. rate 20, height 5\' 8"  (1.727  m), weight 148 lb 2.4 oz (67.2 kg), SpO2 93 %.  General: chronically ill appearing, lethargic and will no open eyes to talk to me.  Head: Eyes PERRLA, No xanthomas.   Normocephalic and atraumatic, oropharynx without edema or exudate.   Lungs: CTAB Heart: HRRR S1 S2, no rub/gallop, Heart regular rhythm, tachy with S1, S2  No murmur. pulses are 2+ extrem.   Neck: No carotid bruits. No lymphadenopathy. No  JVD. Abdomen: Bowel sounds present, abdomen soft and non-tender without masses or hernias noted. Msk:  No spine or cva tenderness. No weakness, no joint deformities or effusions. Extremities: No clubbing or cyanosis.  No LE edema.  Neuro: not responding to me  Psych:  Good affect, responds appropriately Skin: No rashes or lesions noted.  Labs:   Lab Results  Component Value Date   WBC 24.8 (H) 08/30/2016   HGB 16.5 08/30/2016   HCT 45.4 08/30/2016   MCV 82.8 08/30/2016   PLT 309 08/30/2016    Recent Labs  08/30/16 2216  INR 1.19    Recent Labs Lab 08/30/16 1754  NA 129*  K 4.2  CL 92*  CO2 17*  BUN 29*  CREATININE 1.28*  CALCIUM 9.9  GLUCOSE 279*   Magnesium  Date Value Ref Range Status  04/15/2015 1.7 1.7 - 2.4 mg/dL Final    Recent Labs  08/30/16 2216 08/31/16 0231 08/31/16 0608  TROPONINI 0.07* 0.07* 0.21*    Recent Labs  08/30/16 1802  TROPIPOC 0.06   No results found for: PROBNP Lab Results  Component Value Date   CHOL 108 08/31/2016   HDL 44 08/31/2016   LDLCALC 49 08/31/2016   TRIG 75 08/31/2016   Lab Results  Component Value Date   DDIMER 2.64 (H) 08/30/2016   Lipase  Date/Time Value Ref Range Status  04/08/2015 08:49 PM 12 (L) 22 - 51 U/L Final   TSH  Date/Time Value Ref Range Status  04/09/2015 02:21 PM 0.947 0.350 - 4.500 uIU/mL Final   Vitamin B-12  Date/Time Value Ref Range Status  04/09/2015 03:55 PM 572 180 - 914 pg/mL Final    Comment:    (NOTE) This assay is not validated for testing neonatal or myeloproliferative  syndrome specimens for Vitamin B12 levels.     Echo: pending.   ECG:  sinus tach HR 116 with inferolateral ST depression vs LVH with repol abnormality.   Radiology:  Dg Elbow 2 Views Right  Result Date: 08/30/2016 CLINICAL DATA:  L bowl redness swelling and pain EXAM: RIGHT ELBOW - 2 VIEW COMPARISON:  None. FINDINGS: Examination is limited by a positioning. No gross fracture dislocation. Large spur of the olecranon. Diffuse soft tissue swelling. Probable joint effusion although limited assessment due to positioning. IMPRESSION: 1. Nonstandard positioning limits the exam 2. No gross fracture dislocation 3. Soft tissue swelling.  Possible joint effusion Electronically Signed   By: Donavan Foil M.D.   On: 08/30/2016 22:52   Ct Angio Chest Pe W Or Wo Contrast  Result Date: 08/31/2016 CLINICAL DATA:  Weakness shortness of breath and chest pain EXAM: CT ANGIOGRAPHY CHEST WITH CONTRAST TECHNIQUE: Multidetector CT imaging of the chest was performed using the standard protocol during bolus administration of intravenous contrast. Multiplanar CT image reconstructions and MIPs were obtained to evaluate the vascular anatomy. CONTRAST:  100 cc Isovue 370 intravenous COMPARISON:  Chest x-ray 08/30/1998 FINDINGS: Cardiovascular: Satisfactory opacification of the pulmonary arteries to the segmental level. No evidence of pulmonary embolism. Extensive atherosclerotic calcifications within the aorta and great vessels. No aneurysm. Coronary artery calcifications. Heart size upper normal. No pericardial effusion. Mediastinum/Nodes: Trachea is midline. Thyroid unremarkable. Subcentimeter nonspecific mediastinal lymph nodes. Esophagus within normal limits. Lungs/Pleura: Mild emphysematous disease is present bilaterally. Tiny 3 mm nodule in the right upper lobe series 5, image number 32. No consolidation. No pneumothorax or pleural effusion. Dependent atelectasis. Upper Abdomen: No acute abnormality. Left adrenal gland  appears enlarged with possible nodules. Musculoskeletal: Degenerative changes of the spine. No acute or suspicious bone lesions. Review of the MIP images confirms the above findings. IMPRESSION: 1. No CT evidence for acute pulmonary embolus. 2. No acute infiltrates.  Mild emphysematous disease. 3. 3 mm right upper lobe pulmonary nodule. No follow-up needed if patient is low-risk. Non-contrast chest CT can be considered in 12 months if patient  is high-risk. This recommendation follows the consensus statement: Guidelines for Management of Incidental Pulmonary Nodules Detected on CT Images: From the Fleischner Society 2017; Radiology 2017; 284:228-243. 4. Left adrenal gland appears slightly enlarged with possible nodules. Follow-up CT with adrenal protocol could be considered for further evaluation. Electronically Signed   By: Donavan Foil M.D.   On: 08/31/2016 03:06   Dg Chest Portable 1 View  Result Date: 08/30/2016 CLINICAL DATA:  Weakness and shortness of breath with chest pain EXAM: PORTABLE CHEST 1 VIEW COMPARISON:  04/08/2015 FINDINGS: Mild patchy atelectasis at the left lung base. No effusion. Stable cardiomediastinal silhouette with atherosclerosis. No pneumothorax. Multiple metallic densities over the neck and upper chest. IMPRESSION: Mild patchy atelectasis at the left lung base. Atherosclerotic vascular disease of the aorta Electronically Signed   By: Donavan Foil M.D.   On: 08/30/2016 18:42    ASSESSMENT AND PLAN:    Principal Problem:   Bacteremia due to methicillin resistant Staphylococcus aureus Active Problems:   Alcohol abuse   Essential hypertension   Sepsis ([place])   Chest pain   AKI (acute kidney injury) ([place])   Septic olecranon bursitis of right elbow   Chronic diastolic CHF (congestive heart failure) ([facility])   Elevated troponin   Pressure injury of skin   Andrew [name] is a 71 y.o. male with a history of HTN, HLD, DMT2, AAA (4.8cm on MRI 03/2015), right eye blindness, chronic  diastolic CHF, tobacco abuse and alcohol abuse who presented to [facility] on 08/30/16 with chest pain and right elbow pain. Found to have septic olecranon bursitis of his right elbow complicated by MRSA bacteremia. Cardiology consulted for chest pain and elevated troponin.   Chest pain/Elevated troponin: patient is not responding to me in the room but appears comfortable with no chest pain. Although, he did present with chest pain. ECG with sinus tachycardia with some inferolateral ST depression vs LVH with repol abnormality on initial ECG. Troponin 0.07--> 0.07--> 0.21. Could be demand in the setting of sepsis. Unless there is a very large troponin bump, would wait until he recovers from sepsis/bacteremia and plan for nuclear stress test vs cath at a later time.  Septic olecranon bursitis of his right elbow complicated by MRSA bacteremia: continue IV abx per ID. Plans to call ortho to drain elbow. Will wait on formal read of echo but possible mass on mitral valve (only seen in one view) will likely need a TEE  HTN: Bp stable currently.   DMT2: per IM  AAA: this can be followed as an outpatient   AMS: non responsive on my interview. Per RN, he has been like this all day. ? Further work up?   Signed: Angelena Form, PA-C 08/31/2016 1:39 PM  Pager [phone]  Co-Sign MD

## 2016-08-31 NOTE — Consult Note (Signed)
Reason for Consult:right olecranon bursal abscess suspected Referring Physician: Doyle Askew MD,   ID team Andrew [name] is an 71 y.o. male.  HPI: 71 yo male admitted with DM, AAA, dCHF, admitted with chest pain fever and right arm pain. No chills or fever . Pt with low BS improving today with Gm positive bacteremia. Right olecranon swelling and erythema noted on admission.    Past Medical History:  Diagnosis Date  . Blind right eye   . Diabetes mellitus without complication ([place])   . Hyperlipemia   . Hypertension   . 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 No pertinent surgical history.  Family History  Problem Relation Age of Onset  . Diabetes Mellitus II Mother   . Diabetes Mellitus II Father   . Diabetes Mellitus II Sister   . Diabetes Mellitus II Sister     Social History:  reports that he has been smoking Cigarettes.  He has been smoking about 1.50 packs per day. He has never used smokeless tobacco. He reports that he drinks about 16.8 oz of alcohol per week . His drug history is not on file.  Allergies:  Allergies  Allergen Reactions  . Penicillins Rash and Other (See Comments)    Possibly rash? Has patient had a PCN reaction causing immediate rash, facial/tongue/throat swelling, SOB or lightheadedness with hypotension: YES Has patient had a PCN reaction causing severe rash involving mucus membranes or skin necrosis:NO Has patient had a PCN reaction that required hospitalization NO Has patient had a PCN reaction occurring within the last 10 years:NO If all of the above answers are "NO", then may proceed with Cephalosporin use.    Medications: I have reviewed the patient's current medications.  Results for orders placed or performed during the hospital encounter of 08/30/16 (from the past 48 hour(s))  Basic metabolic panel     Status: Abnormal   Collection Time: 08/30/16  5:54 PM  Result Value Ref Range    Sodium 129 (L) 135 - 145 mmol/L   Potassium 4.2 3.5 - 5.1 mmol/L   Chloride 92 (L) 101 - 111 mmol/L   CO2 17 (L) 22 - 32 mmol/L   Glucose, Bld 279 (H) 65 - 99 mg/dL   BUN 29 (H) 6 - 20 mg/dL   Creatinine, Ser 1.28 (H) 0.61 - 1.24 mg/dL   Calcium 9.9 8.9 - 10.3 mg/dL   GFR calc non Af Amer 55 (L) >60 mL/min   GFR calc Af Amer >60 >60 mL/min    Comment: (NOTE) The eGFR has been calculated using the CKD EPI equation. This calculation has not been validated in all clinical situations. eGFR's persistently <60 mL/min signify possible Chronic Kidney Disease.    Anion gap 20 (H) 5 - 15  CBC     Status: Abnormal   Collection Time: 08/30/16  5:54 PM  Result Value Ref Range   WBC 24.8 (H) 4.0 - 10.5 K/uL   RBC 5.48 4.22 - 5.81 MIL/uL   Hemoglobin 16.5 13.0 - 17.0 g/dL   HCT 45.4 39.0 - 52.0 %   MCV 82.8 78.0 - 100.0 fL   MCH 30.1 26.0 - 34.0 pg   MCHC 36.3 (H) 30.0 - 36.0 g/dL   RDW 13.4 11.5 - 15.5 %   Platelets 309 150 - 400 K/uL  I-stat troponin, ED     Status: None   Collection Time: 08/30/16  6:02 PM  Result Value Ref  Range   Troponin i, poc 0.06 0.00 - 0.08 ng/mL   Comment 3            Comment: Due to the release kinetics of cTnI, a negative result within the first hours of the onset of symptoms does not rule out myocardial infarction with certainty. If myocardial infarction is still suspected, repeat the test at appropriate intervals.   I-Stat CG4 Lactic Acid, ED     Status: Abnormal   Collection Time: 08/30/16  6:04 PM  Result Value Ref Range   Lactic Acid, Venous 4.85 (HH) 0.5 - 1.9 mmol/L   Comment NOTIFIED PHYSICIAN   CBG monitoring, ED     Status: Abnormal   Collection Time: 08/30/16  6:24 PM  Result Value Ref Range   Glucose-Capillary 303 (H) 65 - 99 mg/dL  Blood Culture (routine x 2)     Status: None (Preliminary result)   Collection Time: 08/30/16  6:48 PM  Result Value Ref Range   Specimen Description RIGHT ANTECUBITAL    Special Requests BOTTLES DRAWN  AEROBIC AND ANAEROBIC 5CC    Culture  Setup Time      GRAM POSITIVE COCCI IN CLUSTERS IN BOTH AEROBIC AND ANAEROBIC BOTTLES CRITICAL RESULT CALLED TO, READ BACK BY AND VERIFIED WITH: N. Batchelder Pharm.D. 11:35 08/31/16  ([email or web address])    Culture GRAM POSITIVE COCCI    Report Status PENDING   Blood Culture ID Panel (Reflexed)     Status: Abnormal   Collection Time: 08/30/16  6:48 PM  Result Value Ref Range   Enterococcus species NOT DETECTED NOT DETECTED   Listeria monocytogenes NOT DETECTED NOT DETECTED   Staphylococcus species DETECTED (A) NOT DETECTED    Comment: CRITICAL RESULT CALLED TO, READ BACK BY AND VERIFIED WITH: N. Batchelder Pharm.D. 11:35 08/31/16 ([email or web address])    Staphylococcus aureus DETECTED (A) NOT DETECTED    Comment: CRITICAL RESULT CALLED TO, READ BACK BY AND VERIFIED WITH: N. Batchelder Pharm.D. 11:35 08/31/16 ([email or web address])    Methicillin resistance DETECTED (A) NOT DETECTED    Comment: CRITICAL RESULT CALLED TO, READ BACK BY AND VERIFIED WITH: N. Batchelder Pharm.D. 11:35 08/31/16 ([email or web address])    Streptococcus species NOT DETECTED NOT DETECTED   Streptococcus agalactiae NOT DETECTED NOT DETECTED   Streptococcus pneumoniae NOT DETECTED NOT DETECTED   Streptococcus pyogenes NOT DETECTED NOT DETECTED   Acinetobacter baumannii NOT DETECTED NOT DETECTED   Enterobacteriaceae species NOT DETECTED NOT DETECTED   Enterobacter cloacae complex NOT DETECTED NOT DETECTED   Escherichia coli NOT DETECTED NOT DETECTED   Klebsiella oxytoca NOT DETECTED NOT DETECTED   Klebsiella pneumoniae NOT DETECTED NOT DETECTED   Proteus species NOT DETECTED NOT DETECTED   Serratia marcescens NOT DETECTED NOT DETECTED   Haemophilus influenzae NOT DETECTED NOT DETECTED   Neisseria meningitidis NOT DETECTED NOT DETECTED   Pseudomonas aeruginosa NOT DETECTED NOT DETECTED   Candida albicans NOT DETECTED NOT DETECTED   Candida glabrata NOT DETECTED NOT DETECTED   Candida krusei NOT DETECTED NOT DETECTED    Candida parapsilosis NOT DETECTED NOT DETECTED   Candida tropicalis NOT DETECTED NOT DETECTED  Blood Culture (routine x 2)     Status: None (Preliminary result)   Collection Time: 08/30/16  6:59 PM  Result Value Ref Range   Specimen Description LEFT ANTECUBITAL    Special Requests BOTTLES DRAWN AEROBIC AND ANAEROBIC  5CC    Culture  Setup Time      GRAM POSITIVE COCCI IN CLUSTERS ANAEROBIC BOTTLE ONLY Organism ID to follow  Culture PENDING    Report Status PENDING   I-Stat venous blood gas, ED     Status: Abnormal   Collection Time: 08/30/16  7:08 PM  Result Value Ref Range   pH, Ven 7.289 7.250 - 7.430   pCO2, Ven 40.5 (L) 44.0 - 60.0 mmHg   pO2, Ven 34.0 32.0 - 45.0 mmHg   Bicarbonate 19.4 (L) 20.0 - 28.0 mmol/L   TCO2 21 0 - 100 mmol/L   O2 Saturation 59.0 %   Acid-base deficit 7.0 (H) 0.0 - 2.0 mmol/L   Patient temperature HIDE    Sample type VENOUS    Comment NOTIFIED PHYSICIAN   Urinalysis, Routine w reflex microscopic     Status: Abnormal   Collection Time: 08/30/16  7:26 PM  Result Value Ref Range   Color, Urine YELLOW YELLOW   APPearance HAZY (A) CLEAR   Specific Gravity, Urine 1.020 1.005 - 1.030   pH 5.0 5.0 - 8.0   Glucose, UA >=500 (A) NEGATIVE mg/dL   Hgb urine dipstick MODERATE (A) NEGATIVE   Bilirubin Urine NEGATIVE NEGATIVE   Ketones, ur 20 (A) NEGATIVE mg/dL   Protein, ur 100 (A) NEGATIVE mg/dL   Nitrite NEGATIVE NEGATIVE   Leukocytes, UA NEGATIVE NEGATIVE   RBC / HPF 0-5 0 - 5 RBC/hpf   WBC, UA 0-5 0 - 5 WBC/hpf   Bacteria, UA RARE (A) NONE SEEN   Squamous Epithelial / LPF 0-5 (A) NONE SEEN   Mucous PRESENT    Hyaline Casts, UA PRESENT   Creatinine, urine, random     Status: None   Collection Time: 08/30/16  7:26 PM  Result Value Ref Range   Creatinine, Urine 59.54 mg/dL  Sodium, urine, random     Status: None   Collection Time: 08/30/16  7:26 PM  Result Value Ref Range   Sodium, Ur 22 mmol/L  Lactic acid, plasma     Status: None    Collection Time: 08/30/16  9:06 PM  Result Value Ref Range   Lactic Acid, Venous 1.5 0.5 - 1.9 mmol/L  Glucose, capillary     Status: Abnormal   Collection Time: 08/30/16 10:07 PM  Result Value Ref Range   Glucose-Capillary 272 (H) 65 - 99 mg/dL  Brain natriuretic peptide     Status: Abnormal   Collection Time: 08/30/16 10:16 PM  Result Value Ref Range   B Natriuretic Peptide 292.7 (H) 0.0 - 100.0 pg/mL  D-dimer, quantitative (not at [facility])     Status: Abnormal   Collection Time: 08/30/16 10:16 PM  Result Value Ref Range   D-Dimer, Quant 2.64 (H) 0.00 - 0.50 ug/mL-FEU    Comment: (NOTE) At the manufacturer cut-off of 0.50 ug/mL FEU, this assay has been documented to exclude PE with a sensitivity and negative predictive value of 97 to 99%.  At this time, this assay has not been approved by the FDA to exclude DVT/VTE. Results should be correlated with clinical presentation.   HIV antibody     Status: None   Collection Time: 08/30/16 10:16 PM  Result Value Ref Range   HIV Screen 4th Generation wRfx Non Reactive Non Reactive    Comment: (NOTE) Performed At: [facility] [place], [place] [id number] Lindon Romp MD [id number]   C-reactive protein     Status: Abnormal   Collection Time: 08/30/16 10:16 PM  Result Value Ref Range   CRP 26.1 (H) <1.0 mg/dL  Sedimentation rate     Status: Abnormal  Collection Time: 08/30/16 10:16 PM  Result Value Ref Range   Sed Rate 56 (H) 0 - 16 mm/hr  Troponin I (q 6hr x 3)     Status: Abnormal   Collection Time: 08/30/16 10:16 PM  Result Value Ref Range   Troponin I 0.07 (HH) <0.03 ng/mL    Comment: CRITICAL RESULT CALLED TO, READ BACK BY AND VERIFIED WITH: MARTIN,C RN 08/30/2016 2331 JORDANS   Procalcitonin     Status: None   Collection Time: 08/30/16 10:16 PM  Result Value Ref Range   Procalcitonin 1.62 ng/mL    Comment:        Interpretation: PCT > 0.5 ng/mL and <= 2 ng/mL: Systemic infection (sepsis) is  possible, but other conditions are known to elevate PCT as well. (NOTE)         ICU PCT Algorithm               Non ICU PCT Algorithm    ----------------------------     ------------------------------         PCT < 0.25 ng/mL                 PCT < 0.1 ng/mL     Stopping of antibiotics            Stopping of antibiotics       strongly encouraged.               strongly encouraged.    ----------------------------     ------------------------------       PCT level decrease by               PCT < 0.25 ng/mL       >= 80% from peak PCT       OR PCT 0.25 - 0.5 ng/mL          Stopping of antibiotics                                             encouraged.     Stopping of antibiotics           encouraged.    ----------------------------     ------------------------------       PCT level decrease by              PCT >= 0.25 ng/mL       < 80% from peak PCT        AND PCT >= 0.5 ng/mL             Continuing antibiotics                                              encouraged.       Continuing antibiotics            encouraged.    ----------------------------     ------------------------------     PCT level increase compared          PCT > 0.5 ng/mL         with peak PCT AND          PCT >= 0.5 ng/mL             Escalation of antibiotics  strongly encouraged.      Escalation of antibiotics        strongly encouraged.   Protime-INR     Status: None   Collection Time: 08/30/16 10:16 PM  Result Value Ref Range   Prothrombin Time 15.2 11.4 - 15.2 seconds   INR 1.19   APTT     Status: Abnormal   Collection Time: 08/30/16 10:16 PM  Result Value Ref Range   aPTT 37 (H) 24 - 36 seconds    Comment:        IF BASELINE aPTT IS ELEVATED, SUGGEST PATIENT RISK ASSESSMENT BE USED TO DETERMINE APPROPRIATE ANTICOAGULANT THERAPY.   Lactic acid, plasma     Status: None   Collection Time: 08/31/16 12:06 AM  Result Value Ref Range   Lactic Acid, Venous 1.2 0.5 -  1.9 mmol/L  Lipid panel     Status: None   Collection Time: 08/31/16  2:31 AM  Result Value Ref Range   Cholesterol 108 0 - 200 mg/dL   Triglycerides 75 <150 mg/dL   HDL 44 >40 mg/dL   Total CHOL/HDL Ratio 2.5 RATIO   VLDL 15 0 - 40 mg/dL   LDL Cholesterol 49 0 - 99 mg/dL    Comment:        Total Cholesterol/HDL:CHD Risk Coronary Heart Disease Risk Table                     Men   Women  1/2 Average Risk   3.4   3.3  Average Risk       5.0   4.4  2 X Average Risk   9.6   7.1  3 X Average Risk  23.4   11.0        Use the calculated Patient Ratio above and the CHD Risk Table to determine the patient's CHD Risk.        ATP III CLASSIFICATION (LDL):  <100     mg/dL   Optimal  100-129  mg/dL   Near or Above                    Optimal  130-159  mg/dL   Borderline  160-189  mg/dL   High  >190     mg/dL   Very High   Troponin I (q 6hr x 3)     Status: Abnormal   Collection Time: 08/31/16  2:31 AM  Result Value Ref Range   Troponin I 0.07 (HH) <0.03 ng/mL    Comment: CRITICAL VALUE NOTED.  VALUE IS CONSISTENT WITH PREVIOUSLY REPORTED AND CALLED VALUE.  Glucose, capillary     Status: None   Collection Time: 08/31/16  5:53 AM  Result Value Ref Range   Glucose-Capillary 92 65 - 99 mg/dL   Comment 1 Notify RN    Comment 2 Document in Chart   Troponin I (q 6hr x 3)     Status: Abnormal   Collection Time: 08/31/16  6:08 AM  Result Value Ref Range   Troponin I 0.21 (HH) <0.03 ng/mL    Comment: CRITICAL VALUE NOTED.  VALUE IS CONSISTENT WITH PREVIOUSLY REPORTED AND CALLED VALUE.  Glucose, capillary     Status: Abnormal   Collection Time: 08/31/16 11:35 AM  Result Value Ref Range   Glucose-Capillary 44 (LL) 65 - 99 mg/dL  Glucose, capillary     Status: Abnormal   Collection Time: 08/31/16 11:55 AM  Result Value Ref Range   Glucose-Capillary 131 (H)  65 - 99 mg/dL  Glucose, capillary     Status: Abnormal   Collection Time: 08/31/16  5:00 PM  Result Value Ref Range    Glucose-Capillary 37 (LL) 65 - 99 mg/dL   Comment 1 Notify RN   Glucose, capillary     Status: Abnormal   Collection Time: 08/31/16  5:28 PM  Result Value Ref Range   Glucose-Capillary 168 (H) 65 - 99 mg/dL    Dg Elbow 2 Views Right  Result Date: 08/30/2016 CLINICAL DATA:  L bowl redness swelling and pain EXAM: RIGHT ELBOW - 2 VIEW COMPARISON:  None. FINDINGS: Examination is limited by a positioning. No gross fracture dislocation. Large spur of the olecranon. Diffuse soft tissue swelling. Probable joint effusion although limited assessment due to positioning. IMPRESSION: 1. Nonstandard positioning limits the exam 2. No gross fracture dislocation 3. Soft tissue swelling.  Possible joint effusion Electronically Signed   By: Donavan Foil M.D.   On: 08/30/2016 22:52   Ct Angio Chest Pe W Or Wo Contrast  Result Date: 08/31/2016 CLINICAL DATA:  Weakness shortness of breath and chest pain EXAM: CT ANGIOGRAPHY CHEST WITH CONTRAST TECHNIQUE: Multidetector CT imaging of the chest was performed using the standard protocol during bolus administration of intravenous contrast. Multiplanar CT image reconstructions and MIPs were obtained to evaluate the vascular anatomy. CONTRAST:  100 cc Isovue 370 intravenous COMPARISON:  Chest x-ray 08/30/1998 FINDINGS: Cardiovascular: Satisfactory opacification of the pulmonary arteries to the segmental level. No evidence of pulmonary embolism. Extensive atherosclerotic calcifications within the aorta and great vessels. No aneurysm. Coronary artery calcifications. Heart size upper normal. No pericardial effusion. Mediastinum/Nodes: Trachea is midline. Thyroid unremarkable. Subcentimeter nonspecific mediastinal lymph nodes. Esophagus within normal limits. Lungs/Pleura: Mild emphysematous disease is present bilaterally. Tiny 3 mm nodule in the right upper lobe series 5, image number 32. No consolidation. No pneumothorax or pleural effusion. Dependent atelectasis. Upper Abdomen: No  acute abnormality. Left adrenal gland appears enlarged with possible nodules. Musculoskeletal: Degenerative changes of the spine. No acute or suspicious bone lesions. Review of the MIP images confirms the above findings. IMPRESSION: 1. No CT evidence for acute pulmonary embolus. 2. No acute infiltrates.  Mild emphysematous disease. 3. 3 mm right upper lobe pulmonary nodule. No follow-up needed if patient is low-risk. Non-contrast chest CT can be considered in 12 months if patient is high-risk. This recommendation follows the consensus statement: Guidelines for Management of Incidental Pulmonary Nodules Detected on CT Images: From the Fleischner Society 2017; Radiology 2017; 284:228-243. 4. Left adrenal gland appears slightly enlarged with possible nodules. Follow-up CT with adrenal protocol could be considered for further evaluation. Electronically Signed   By: Donavan Foil M.D.   On: 08/31/2016 03:06   Dg Chest Portable 1 View  Result Date: 08/30/2016 CLINICAL DATA:  Weakness and shortness of breath with chest pain EXAM: PORTABLE CHEST 1 VIEW COMPARISON:  04/08/2015 FINDINGS: Mild patchy atelectasis at the left lung base. No effusion. Stable cardiomediastinal silhouette with atherosclerosis. No pneumothorax. Multiple metallic densities over the neck and upper chest. IMPRESSION: Mild patchy atelectasis at the left lung base. Atherosclerotic vascular disease of the aorta Electronically Signed   By: Donavan Foil M.D.   On: 08/30/2016 18:42    Review of Systems  Constitutional: Negative for chills and fever.  Eyes:       Blind in one eye  Respiratory: Positive for shortness of breath.   Cardiovascular: Positive for chest pain.  Gastrointestinal: Negative for blood in stool, constipation and diarrhea.  Genitourinary: Negative for dysuria and urgency.  Musculoskeletal:       Right arm pain with cellulitis  Skin:       Right arm erythema times several days  Neurological: Negative for dizziness and  headaches.  Psychiatric/Behavioral:       Minimal Hx, answers short questions.    Blood pressure (!) 114/48, pulse 74, temperature 99.5 F (37.5 C), temperature source Rectal, resp. rate 20, height 5' 8"  (1.727 m), weight 148 lb 2.4 oz (67.2 kg), SpO2 93 %. Physical Exam  Constitutional:  Elderly, ill appearing .   Eyes:  Blind one eye  Neck: Normal range of motion. No tracheal deviation present. No thyromegaly present.  Cardiovascular: Normal rate.   Respiratory: No respiratory distress. He has no wheezes.  GI: He exhibits no distension.  Neurological: He is alert.  Skin: No rash noted. There is erythema. No pallor.  Psychiatric:  Answers shot questions.     Assessment/Plan: Betadine prep times 2 , olecranon bursa aspiration with multiple passes ( more than 20 ) with 18 gauge needle. Adjacent entry site adjacent to first and attempt to express any purulence not sucessful. Only trace blood obtained. No pus obtained. Will follow with you my cell  [phone].   CT angio of chest actually shot cuts through elbow which I reviewed. Do not see a pocket of fluid. He does have large spur at triceps insertion site. Plain xrays reviewed no Fx. No effusion.  Will follow. Thank you for the opportunity to see the patient in consultation.  Marybelle Killings 08/31/2016, 7:01 PM

## 2016-08-31 NOTE — Progress Notes (Addendum)
Lou Cal, Mr. Coelho daughter, told Aileen Pilot, RN that she is to be contacted if needed for her father and not her father's neighbor who Mr. Morone son, Letitia Libra confirmed provided care for Mr. Wassom.  This was reported to night shift RN, Edd Arbour, on hand-off report.  Linward Natal is name of neighbor/friend listed in contact list.

## 2016-08-31 NOTE — Progress Notes (Signed)
CBG 44.  Hypoglycemia protocol initiated and 45ml Dextrose 50% given IV.  15 minute folllow up CBG = 131

## 2016-09-01 DIAGNOSIS — M7021 Olecranon bursitis, right elbow: Secondary | ICD-10-CM

## 2016-09-01 DIAGNOSIS — Z9889 Other specified postprocedural states: Secondary | ICD-10-CM

## 2016-09-01 LAB — BASIC METABOLIC PANEL
ANION GAP: 11 (ref 5–15)
BUN: 16 mg/dL (ref 6–20)
CO2: 22 mmol/L (ref 22–32)
Calcium: 8.7 mg/dL — ABNORMAL LOW (ref 8.9–10.3)
Chloride: 100 mmol/L — ABNORMAL LOW (ref 101–111)
Creatinine, Ser: 0.5 mg/dL — ABNORMAL LOW (ref 0.61–1.24)
Glucose, Bld: 82 mg/dL (ref 65–99)
POTASSIUM: 2.4 mmol/L — AB (ref 3.5–5.1)
SODIUM: 133 mmol/L — AB (ref 135–145)

## 2016-09-01 LAB — CBC
HCT: 40 % (ref 39.0–52.0)
Hemoglobin: 14.5 g/dL (ref 13.0–17.0)
MCH: 29.5 pg (ref 26.0–34.0)
MCHC: 36.3 g/dL — ABNORMAL HIGH (ref 30.0–36.0)
MCV: 81.3 fL (ref 78.0–100.0)
PLATELETS: 251 10*3/uL (ref 150–400)
RBC: 4.92 MIL/uL (ref 4.22–5.81)
RDW: 14 % (ref 11.5–15.5)
WBC: 17 10*3/uL — AB (ref 4.0–10.5)

## 2016-09-01 LAB — GLUCOSE, CAPILLARY
GLUCOSE-CAPILLARY: 84 mg/dL (ref 65–99)
Glucose-Capillary: 70 mg/dL (ref 65–99)
Glucose-Capillary: 106 mg/dL — ABNORMAL HIGH (ref 65–99)
Glucose-Capillary: 94 mg/dL (ref 65–99)
Glucose-Capillary: 184 mg/dL — ABNORMAL HIGH (ref 65–99)
Glucose-Capillary: 154 mg/dL — ABNORMAL HIGH (ref 65–99)

## 2016-09-01 LAB — MAGNESIUM: MAGNESIUM: 1.6 mg/dL — AB (ref 1.7–2.4)

## 2016-09-01 LAB — HEMOGLOBIN A1C: Hgb A1c MFr Bld: >15.5 % — ABNORMAL HIGH (ref 4.8–5.6)

## 2016-09-01 LAB — URINE CULTURE: Culture: NO GROWTH

## 2016-09-01 MED ORDER — ENSURE ENLIVE PO LIQD
237.0000 mL | Freq: Three times a day (TID) | ORAL | Status: DC
  Administered 2016-09-02: 237 mL via ORAL

## 2016-09-01 MED ORDER — MAGNESIUM SULFATE 2 GM/50ML IV SOLN
2.0000 g | Freq: Once | INTRAVENOUS | Status: AC
  Administered 2016-09-01: 2 g via INTRAVENOUS
  Filled 2016-09-01: qty 50

## 2016-09-01 MED ORDER — POTASSIUM CHLORIDE CRYS ER 20 MEQ PO TBCR
40.0000 meq | EXTENDED_RELEASE_TABLET | ORAL | Status: AC
  Administered 2016-09-01 (×2): 40 meq via ORAL
  Filled 2016-09-01 (×2): qty 2

## 2016-09-01 MED ORDER — VANCOMYCIN HCL IN DEXTROSE 750-5 MG/150ML-% IV SOLN
750.0000 mg | Freq: Two times a day (BID) | INTRAVENOUS | Status: DC
  Administered 2016-09-01 – 2016-09-02 (×3): 750 mg via INTRAVENOUS
  Filled 2016-09-01 (×5): qty 150

## 2016-09-01 NOTE — Progress Notes (Signed)
Paged MD regarding holding patient long acting insulin lantus 40 units. Awaiting call back. Talked to pharmacist, she stated to hold insulin. Will continue to monitor patient.  Milanni Ayub Leory Plowman

## 2016-09-01 NOTE — Progress Notes (Addendum)
Patient ID: Andrew [name], male   DOB: [birth year], 71 y.o.   MRN: [id number]    PROGRESS NOTE    Andrew [name]  QUI:[id number] DOB: 05-Sep-1945 DOA: 08/30/2016  PCP: Woody Seller, MD   Brief Narrative:  71 y.o. male with hypertension, hyperlipidemia, diabetes mellitus, anxiety, AAA premises 4.8 cm on MRI 04/09/15), right eye blindness, dCHF, who presented with two days duration of substernal and pressure like chest pain, SOB and right elbow pain.  ED Course: pt was found to have WBC 24.8, lactate of 4.85, troponin 0.07, acute renal injury with Cr 1.28, hyponatremia with sodium 129. Chest x-ray showed patchy atelectasis in the left base without infiltration. D-dimer positive, but CT angiograms negative for PE or infiltration.   # CTA of chest: 1. No CT evidence for acute pulmonary embolus. 2. No acute infiltrates. Mild emphysematous disease. 3. 3 mm right upper lobe pulmonary nodule.  4. Left adrenal gland enlarged with ? nodules. Follow-up CT with adrenal protocol for further evaluation.  Assessment & Plan:  Chest pain and positive trop - CT angiogram is negative for PE and infiltration - possible demands ischemia in the setting of sepsis but pt with underlying risk factors so will need monitoring  - initial troponin 0.07 --> 0.21 this AM - cardio following, plan for TEE in AM - no need to cycle trop's further   Sepsis secondary to R olecranon bursitis, MRSA bacteremia  - pt met criteria for sepsis with elevated lactate, leukocytosis, tachycardia and tachypnea.  - continue vanc day #3 - appreciate ID team assistance, ortho consulted and attempted aspirating but only trace blood obtained, no pocket of fluid observed  - appreciate Dr. 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  - hypoglycemic this AM - has been NPO due to lethargy - now more alert, advance diet, SLP also requested - stop Lantus until oral intake improves   Chronic diastolic CHF (congestive heart failure) - 2-D echo 04/09/15 showed EF of 65-70% with grade 1 diastolic dysfunction - new ECHO requested on this admission - monitor volume status closely  - Continue metoprolol and aspirin - strict I/O, daily weights  - cardiology team following   Acute metabolic encephalopathy - from sepsis  - more alert this AM - advance diet  - will need PT eval as well   Hypokalemia - Mg also low, will supplement  - supplement K and repeat BMP in AM  Pressure injury - WOC consulted   Alcohol abuse - in remission  Essential hypertension:  - Amlodipine, metoprolol, - Hold lisinopril due to AKI - so far reasonable inpatient control   AKI, hyponatremia, metabolic acidosis  - Cr 8.72. Likely due to prerenal secondary to dehydration, sepsis - continue to hold Lisinopril - Cr is now WNL  - BMP In AM  DVT prophylaxis: Lovenox SQ Code Status: Full  Family Communication: Patient at bedside, no family at bedside  Disposition Plan: to be determined   Consultants:   Cardiology   ID  Ortho   Procedures:   None  Antimicrobials:   Vancomycin 1/14 -->  Zosyn 1/14 --> 1/15   Subjective: Still with right elbow pain and intermittent chest pains.   Objective: Vitals:  08/31/16 2109 09/01/16 0058 09/01/16 0451 09/01/16 1231  BP: 128/76 (!) 144/65 (!) 141/63 131/62  Pulse: 85 87 84 77  Resp: 20 18 18 18   Temp: (!) 100.4 F (38 C) (!) 100.8 F (38.2 C) (!) 100.5 F (38.1 C) 99 F (37.2 C)  TempSrc: Rectal Rectal Rectal Oral  SpO2: 100% 96% 99% 99%  Weight:   65.6 kg (144 lb 10 oz)   Height:        Intake/Output Summary (Last 24 hours) at 09/01/16 1323 Last data filed at 09/01/16 1025  Gross per 24 hour  Intake             1350 ml  Output              1075 ml  Net              275 ml   Filed Weights   08/30/16 2237 08/31/16 0554 09/01/16 0451  Weight: 65.4 kg (144 lb 2.9 oz) 67.2 kg (148 lb 2.4 oz) 65.6 kg (144 lb 10 oz)    Examination:  General exam: Appears more alert, oriented to name and DOB only  Respiratory system: diminished breath sounds at bases  Cardiovascular system: RRR. No JVD, murmurs, rubs, gallops or clicks. No pedal edema. Gastrointestinal system: Abdomen is nondistended, soft and nontender. No organomegaly or masses felt. Central nervous system: Right elbow with mild erythema and still TTP  Data Reviewed: I have personally reviewed following labs and imaging studies  CBC:  Recent Labs Lab 08/30/16 1754 09/01/16 0322  WBC 24.8* 17.0*  HGB 16.5 14.5  HCT 45.4 40.0  MCV 82.8 81.3  PLT 309 326   Basic Metabolic Panel:  Recent Labs Lab 08/30/16 1754 09/01/16 0322 09/01/16 0624  NA 129* 133*  --   K 4.2 2.4*  --   CL 92* 100*  --   CO2 17* 22  --   GLUCOSE 279* 82  --   BUN 29* 16  --   CREATININE 1.28* 0.50*  --   CALCIUM 9.9 8.7*  --   MG  --   --  1.6*   Coagulation Profile:  Recent Labs Lab 08/30/16 2216  INR 1.19   Cardiac Enzymes:  Recent Labs Lab 08/30/16 2216 08/31/16 0231 08/31/16 0608  TROPONINI 0.07* 0.07* 0.21*   CBG:  Recent Labs Lab 08/31/16 2059 09/01/16 0218 09/01/16 0434 09/01/16 0848 09/01/16 1101  GLUCAP 89 70 84 106* 94   Lipid Profile:  Recent Labs  08/31/16 0231  CHOL 108  HDL 44  LDLCALC 49  TRIG 75  CHOLHDL 2.5   Urine analysis:    Component Value Date/Time   COLORURINE YELLOW 08/30/2016 1926   APPEARANCEUR HAZY (A) 08/30/2016 1926   LABSPEC 1.020 08/30/2016 1926   PHURINE 5.0 08/30/2016 1926   GLUCOSEU >=500 (A) 08/30/2016 1926   HGBUR MODERATE (A) 08/30/2016 1926   BILIRUBINUR NEGATIVE 08/30/2016 1926   KETONESUR 20 (A) 08/30/2016 1926   PROTEINUR 100 (A) 08/30/2016 1926   UROBILINOGEN 0.2 04/18/2015 0210   NITRITE NEGATIVE  08/30/2016 1926   LEUKOCYTESUR NEGATIVE 08/30/2016 1926   Recent Results (from the past 240 hour(s))  Blood Culture (routine x 2)     Status: Abnormal (Preliminary result)   Collection Time: 08/30/16  6:48 PM  Result Value Ref Range Status   Specimen Description RIGHT ANTECUBITAL  Final   Special Requests BOTTLES DRAWN AEROBIC AND ANAEROBIC 5CC  Final   Culture  Setup Time  Final    GRAM POSITIVE COCCI IN CLUSTERS IN BOTH AEROBIC AND ANAEROBIC BOTTLES CRITICAL RESULT CALLED TO, READ BACK BY AND VERIFIED WITH: N. Batchelder Pharm.D. 11:35 08/31/16  ([email or web address])    Culture (A)  Final    STAPHYLOCOCCUS AUREUS SUSCEPTIBILITIES TO FOLLOW    Report Status PENDING  Incomplete  Blood Culture ID Panel (Reflexed)     Status: Abnormal   Collection Time: 08/30/16  6:48 PM  Result Value Ref Range Status   Enterococcus species NOT DETECTED NOT DETECTED Final   Listeria monocytogenes NOT DETECTED NOT DETECTED Final   Staphylococcus species DETECTED (A) NOT DETECTED Final    Comment: CRITICAL RESULT CALLED TO, READ BACK BY AND VERIFIED WITH: N. Batchelder Pharm.D. 11:35 08/31/16 ([email or web address])    Staphylococcus aureus DETECTED (A) NOT DETECTED Final    Comment: CRITICAL RESULT CALLED TO, READ BACK BY AND VERIFIED WITH: N. Batchelder Pharm.D. 11:35 08/31/16 ([email or web address])    Methicillin resistance DETECTED (A) NOT DETECTED Final    Comment: CRITICAL RESULT CALLED TO, READ BACK BY AND VERIFIED WITH: N. Batchelder Pharm.D. 11:35 08/31/16 ([email or web address])    Streptococcus species NOT DETECTED NOT DETECTED Final   Streptococcus agalactiae NOT DETECTED NOT DETECTED Final   Streptococcus pneumoniae NOT DETECTED NOT DETECTED Final   Streptococcus pyogenes NOT DETECTED NOT DETECTED Final   Acinetobacter baumannii NOT DETECTED NOT DETECTED Final   Enterobacteriaceae species NOT DETECTED NOT DETECTED Final   Enterobacter cloacae complex NOT DETECTED NOT DETECTED Final   Escherichia coli NOT DETECTED NOT DETECTED Final    Klebsiella oxytoca NOT DETECTED NOT DETECTED Final   Klebsiella pneumoniae NOT DETECTED NOT DETECTED Final   Proteus species NOT DETECTED NOT DETECTED Final   Serratia marcescens NOT DETECTED NOT DETECTED Final   Haemophilus influenzae NOT DETECTED NOT DETECTED Final   Neisseria meningitidis NOT DETECTED NOT DETECTED Final   Pseudomonas aeruginosa NOT DETECTED NOT DETECTED Final   Candida albicans NOT DETECTED NOT DETECTED Final   Candida glabrata NOT DETECTED NOT DETECTED Final   Candida krusei NOT DETECTED NOT DETECTED Final   Candida parapsilosis NOT DETECTED NOT DETECTED Final   Candida tropicalis NOT DETECTED NOT DETECTED Final  Blood Culture (routine x 2)     Status: Abnormal (Preliminary result)   Collection Time: 08/30/16  6:59 PM  Result Value Ref Range Status   Specimen Description LEFT ANTECUBITAL  Final   Special Requests BOTTLES DRAWN AEROBIC AND ANAEROBIC  5CC  Final   Culture  Setup Time   Final    GRAM POSITIVE COCCI IN CLUSTERS IN BOTH AEROBIC AND ANAEROBIC BOTTLES CRITICAL VALUE NOTED.  VALUE IS CONSISTENT WITH PREVIOUSLY REPORTED AND CALLED VALUE.    Culture STAPHYLOCOCCUS AUREUS (A)  Final   Report Status PENDING  Incomplete  Blood Culture ID Panel (Reflexed)     Status: Abnormal   Collection Time: 08/30/16  6:59 PM  Result Value Ref Range Status   Enterococcus species NOT DETECTED NOT DETECTED Final   Listeria monocytogenes NOT DETECTED NOT DETECTED Final   Staphylococcus species DETECTED (A) NOT DETECTED Final    Comment: CRITICAL RESULT CALLED TO, READ BACK BY AND VERIFIED WITH: J. [place] [facility] 08/31/16 AT 2211 BY J. FUDESCO    Staphylococcus aureus DETECTED (A) NOT DETECTED Final    Comment: CRITICAL RESULT CALLED TO, READ BACK BY AND VERIFIED WITH: J. [place] [facility] 08/31/16 AT 2211 BY J. FUDESCO    Methicillin resistance DETECTED (A) NOT DETECTED Final    Comment: CRITICAL RESULT CALLED TO, READ  BACK BY AND VERIFIED WITH: J. [place] [facility] 08/31/16 AT 2211 BY  J. FUDESCO    Streptococcus species NOT DETECTED NOT DETECTED Final   Streptococcus agalactiae NOT DETECTED NOT DETECTED Final   Streptococcus pneumoniae NOT DETECTED NOT DETECTED Final   Streptococcus pyogenes NOT DETECTED NOT DETECTED Final   Acinetobacter baumannii NOT DETECTED NOT DETECTED Final   Enterobacteriaceae species NOT DETECTED NOT DETECTED Final   Enterobacter cloacae complex NOT DETECTED NOT DETECTED Final   Escherichia coli NOT DETECTED NOT DETECTED Final   Klebsiella oxytoca NOT DETECTED NOT DETECTED Final   Klebsiella pneumoniae NOT DETECTED NOT DETECTED Final   Proteus species NOT DETECTED NOT DETECTED Final   Serratia marcescens NOT DETECTED NOT DETECTED Final   Haemophilus influenzae NOT DETECTED NOT DETECTED Final   Neisseria meningitidis NOT DETECTED NOT DETECTED Final   Pseudomonas aeruginosa NOT DETECTED NOT DETECTED Final   Candida albicans NOT DETECTED NOT DETECTED Final   Candida glabrata NOT DETECTED NOT DETECTED Final   Candida krusei NOT DETECTED NOT DETECTED Final   Candida parapsilosis NOT DETECTED NOT DETECTED Final   Candida tropicalis NOT DETECTED NOT DETECTED Final  Urine culture     Status: None   Collection Time: 08/30/16  7:26 PM  Result Value Ref Range Status   Specimen Description URINE, RANDOM  Final   Special Requests NONE  Final   Culture NO GROWTH  Final   Report Status 09/01/2016 FINAL  Final      Radiology Studies: Dg Elbow 2 Views Right  Result Date: 08/30/2016 CLINICAL DATA:  L bowl redness swelling and pain EXAM: RIGHT ELBOW - 2 VIEW COMPARISON:  None. FINDINGS: Examination is limited by a positioning. No gross fracture dislocation. Large spur of the olecranon. Diffuse soft tissue swelling. Probable joint effusion although limited assessment due to positioning. IMPRESSION: 1. Nonstandard positioning limits the exam 2. No gross fracture dislocation 3. Soft tissue swelling.  Possible joint effusion Electronically Signed   By: Donavan Foil M.D.   On: 08/30/2016 22:52   Ct Angio Chest Pe W Or Wo Contrast  Result Date: 08/31/2016 CLINICAL DATA:  Weakness shortness of breath and chest pain EXAM: CT ANGIOGRAPHY CHEST WITH CONTRAST TECHNIQUE: Multidetector CT imaging of the chest was performed using the standard protocol during bolus administration of intravenous contrast. Multiplanar CT image reconstructions and MIPs were obtained to evaluate the vascular anatomy. CONTRAST:  100 cc Isovue 370 intravenous COMPARISON:  Chest x-ray 08/30/1998 FINDINGS: Cardiovascular: Satisfactory opacification of the pulmonary arteries to the segmental level. No evidence of pulmonary embolism. Extensive atherosclerotic calcifications within the aorta and great vessels. No aneurysm. Coronary artery calcifications. Heart size upper normal. No pericardial effusion. Mediastinum/Nodes: Trachea is midline. Thyroid unremarkable. Subcentimeter nonspecific mediastinal lymph nodes. Esophagus within normal limits. Lungs/Pleura: Mild emphysematous disease is present bilaterally. Tiny 3 mm nodule in the right upper lobe series 5, image number 32. No consolidation. No pneumothorax or pleural effusion. Dependent atelectasis. Upper Abdomen: No acute abnormality. Left adrenal gland appears enlarged with possible nodules. Musculoskeletal: Degenerative changes of the spine. No acute or suspicious bone lesions. Review of the MIP images confirms the above findings. IMPRESSION: 1. No CT evidence for acute pulmonary embolus. 2. No acute infiltrates.  Mild emphysematous disease. 3. 3 mm right upper lobe pulmonary nodule. No follow-up needed if patient is low-risk. Non-contrast chest CT can be considered in 12 months if patient is high-risk. This recommendation follows the consensus statement: Guidelines for Management of Incidental Pulmonary Nodules Detected on CT  Images: From the Fleischner Society 2017; Radiology 2017; 284:[id number]. 4. Left adrenal gland appears slightly enlarged  with possible nodules. Follow-up CT with adrenal protocol could be considered for further evaluation. Electronically Signed   By: Donavan Foil M.D.   On: 08/31/2016 03:06   Dg Chest Portable 1 View  Result Date: 08/30/2016 CLINICAL DATA:  Weakness and shortness of breath with chest pain EXAM: PORTABLE CHEST 1 VIEW COMPARISON:  04/08/2015 FINDINGS: Mild patchy atelectasis at the left lung base. No effusion. Stable cardiomediastinal silhouette with atherosclerosis. No pneumothorax. Multiple metallic densities over the neck and upper chest. IMPRESSION: Mild patchy atelectasis at the left lung base. Atherosclerotic vascular disease of the aorta Electronically Signed   By: Donavan Foil M.D.   On: 08/30/2016 18:42      Scheduled Meds: . amLODipine  10 mg Oral Daily  . aspirin  325 mg Oral Daily  . atorvastatin  40 mg Oral q1800  . enoxaparin (LOVENOX) injection  40 mg Subcutaneous Q24H  . gabapentin  100 mg Oral TID  . insulin aspart  0-9 Units Subcutaneous TID WC  . insulin glargine  40 Units Subcutaneous Daily  . mouth rinse  15 mL Mouth Rinse BID  . metoprolol succinate  100 mg Oral Daily  . multivitamin with minerals  1 tablet Oral Daily  . mupirocin cream   Topical Daily  . thiamine  100 mg Oral Daily  . vancomycin  750 mg Intravenous Q12H   Continuous Infusions: . dextrose 5 % and 0.9% NaCl 50 mL/hr at 08/31/16 1737     LOS: 2 days   Time spent: 20 minutes   Faye Ramsay, MD [facility] Pager [phone]  If 7PM-7AM, please contact night-coverage [email or web address] Password [email or web address] 09/01/2016, 1:23 PM

## 2016-09-01 NOTE — Progress Notes (Signed)
PT Cancellation Note  Patient Details Name: Andrew [name] MRN: [id number] DOB: 11-20-1945   Cancelled Treatment:    Reason Eval/Treat Not Completed: Medical issues which prohibited therapy.  Critical lab values and will ck later.   [name] 09/01/2016, 8:27 AM   [name], PT MS Acute Rehab Dept. Number: [place]

## 2016-09-01 NOTE — Progress Notes (Signed)
Initial Nutrition Assessment  DOCUMENTATION CODES:   Severe malnutrition in context of chronic illness  INTERVENTION:   Recommend monitoring magnesium, potassium, and phosphorus daily for at least 3 days, MD to replete as needed, as pt is at risk for refeeding syndrome given severe malnutrition, history of alcohol abuse, and uncontrolled diabetes.   Provide Ensure Enlive po TID with meals, each supplement provides 350 kcal and 20 grams of protein Multivitamin with minerals   Recommend Dysphagia 3 (Mechanical Soft) Diet   NUTRITION DIAGNOSIS:   Malnutrition related to chronic illness as evidenced by severe depletion of body fat, severe depletion of muscle mass.   GOAL:   Patient will meet greater than or equal to 90% of their needs   MONITOR:   PO intake, Supplement acceptance, Labs, Weight trends, Skin, I & O's  REASON FOR ASSESSMENT:   Low Braden    ASSESSMENT:   71 y.o. male with medical history significant of hypertension, hyperlipidemia, diabetes mellitus, ETOH abuse, anxiety, AAA premises 4.8 cm on MRI 04/09/15), right eye blindness, dCHF, who presents with chest pain, SOB and right elbow pain.  Pt difficult to assess due to limited responsiveness. He is unable to provide much history other than that he used to weigh 228 lbs. He has severe muscle wasting and severe fat wasting per nutrition-focused physical exam. Per nurse tech, pt drank all of the liquids on his clear liquid lunch tray. Pt received an additional Soft foods tray, but did not eat.   Labs: glucose ranging 37 to 303 mg/dL,  HgbA1c >15.5%, low magnesium, low potassium, low sodium  Diet Order:  DIET SOFT Room service appropriate? Yes; Fluid consistency: Thin  Skin:  Wound (see comment) (3 full thickness abrasion on left elbow)  Last BM:  1/15  Height:   Ht Readings from Last 1 Encounters:  08/30/16 5\' 8"  (1.727 m)    Weight:   Wt Readings from Last 1 Encounters:  09/01/16 144 lb 10 oz (65.6  kg)    Ideal Body Weight:  70 kg  BMI:  Body mass index is 21.99 kg/m.  Estimated Nutritional Needs:   Kcal:  2000-2200  Protein:  90-100 grams  Fluid:  2 L/day  EDUCATION NEEDS:   No education needs identified at this time  [place], CSP, LDN Inpatient Clinical Dietitian Pager: [phone] After Hours Pager: [phone]

## 2016-09-01 NOTE — Progress Notes (Signed)
Pharmacy Antibiotic Note  Andrew [name] is a 71 y.o. male admitted on 08/30/2016 with sepsis and right elbow cellulitis.  Pharmacy has been consulted for vancomycin dosing.  Blood culture now growing MRSA.    Patient's renal function has improved.  He is afebrile but his WBC is improving.   Plan: - Increase vanc back to 750mg  IV Q12H - Monitor renal fxn, micro data, vanc trough prior to 4th dose - F/U reduce Lantus d/t hypoglycemia   Height: 5\' 8"  (172.7 cm) Weight: 144 lb 10 oz (65.6 kg) IBW/kg (Calculated) : 68.4  Temp (24hrs), Avg:100.1 F (37.8 C), Min:98.3 F (36.8 C), Max:101.1 F (38.4 C)   Recent Labs Lab 08/30/16 1754 08/30/16 1804 08/30/16 2106 08/31/16 0006 09/01/16 0322  WBC 24.8*  --   --   --  17.0*  CREATININE 1.28*  --   --   --  0.50*  LATICACIDVEN  --  4.85* 1.5 1.2  --     Estimated Creatinine Clearance: 78.6 mL/min (by C-G formula based on SCr of 0.5 mg/dL (L)).    Allergies  Allergen Reactions  . Penicillins Rash and Other (See Comments)    Possibly rash? Has patient had a PCN reaction causing immediate rash, facial/tongue/throat swelling, SOB or lightheadedness with hypotension: YES Has patient had a PCN reaction causing severe rash involving mucus membranes or skin necrosis:NO Has patient had a PCN reaction that required hospitalization NO Has patient had a PCN reaction occurring within the last 10 years:NO If all of the above answers are "NO", then may proceed with Cephalosporin use.    Antimicrobials this admission:  Vanc 1/14 >> Zosyn 1/14 >> 1/15  Dose adjustments this admission:  N/A  Microbiology results:  1/14 BCx x2 - [facility] (BCID MRSA) 1/14 UCx - 1/16 BCx x2 -   [name] D. Mina [place], PharmD, BCPS Pager:  [phone] 09/01/2016, 7:28 AM

## 2016-09-01 NOTE — Progress Notes (Signed)
[facility] for Infectious Disease   Reason for visit: Follow up on Staph aureus bacteremia  Interval History: had aspiration of joint but no fluid; no fluid noted on CT, no effusion per Dr. Lorin Mercy. Asking for water,  No other history of complaints elicited  Physical Exam: Constitutional:  Vitals:   09/01/16 0058 09/01/16 0451  BP: (!) 144/65 (!) 141/63  Pulse: 87 84  Resp: 18 18  Temp: (!) 100.8 F (38.2 C) (!) 100.5 F (38.1 C)   patient appears in NAD Respiratory: Normal respiratory effort; CTA B Cardiovascular: RRR GI: soft, nt MS: right elbow with some warmth, mild erythema  Review of Systems: Unable to be assessed due to mental status  Lab Results  Component Value Date   WBC 17.0 (H) 09/01/2016   HGB 14.5 09/01/2016   HCT 40.0 09/01/2016   MCV 81.3 09/01/2016   PLT 251 09/01/2016    Lab Results  Component Value Date   CREATININE 0.50 (L) 09/01/2016   BUN 16 09/01/2016   NA 133 (L) 09/01/2016   K 2.4 (LL) 09/01/2016   CL 100 (L) 09/01/2016   CO2 22 09/01/2016    Lab Results  Component Value Date   ALT 33 04/18/2015   AST 28 04/18/2015   ALKPHOS 183 (H) 04/18/2015     Microbiology: Recent Results (from the past 240 hour(s))  Blood Culture (routine x 2)     Status: None (Preliminary result)   Collection Time: 08/30/16  6:48 PM  Result Value Ref Range Status   Specimen Description RIGHT ANTECUBITAL  Final   Special Requests BOTTLES DRAWN AEROBIC AND ANAEROBIC 5CC  Final   Culture  Setup Time   Final    GRAM POSITIVE COCCI IN CLUSTERS IN BOTH AEROBIC AND ANAEROBIC BOTTLES CRITICAL RESULT CALLED TO, READ BACK BY AND VERIFIED WITH: N. Batchelder Pharm.D. 11:35 08/31/16  ([email or web address])    Culture GRAM POSITIVE COCCI  Final   Report Status PENDING  Incomplete  Blood Culture ID Panel (Reflexed)     Status: Abnormal   Collection Time: 08/30/16  6:48 PM  Result Value Ref Range Status   Enterococcus species NOT DETECTED NOT DETECTED Final   Listeria  monocytogenes NOT DETECTED NOT DETECTED Final   Staphylococcus species DETECTED (A) NOT DETECTED Final    Comment: CRITICAL RESULT CALLED TO, READ BACK BY AND VERIFIED WITH: N. Batchelder Pharm.D. 11:35 08/31/16 ([email or web address])    Staphylococcus aureus DETECTED (A) NOT DETECTED Final    Comment: CRITICAL RESULT CALLED TO, READ BACK BY AND VERIFIED WITH: N. Batchelder Pharm.D. 11:35 08/31/16 (wilsonm)    Methicillin resistance DETECTED (A) NOT DETECTED Final    Comment: CRITICAL RESULT CALLED TO, READ BACK BY AND VERIFIED WITH: N. Batchelder Pharm.D. 11:35 08/31/16 (wilsonm)    Streptococcus species NOT DETECTED NOT DETECTED Final   Streptococcus agalactiae NOT DETECTED NOT DETECTED Final   Streptococcus pneumoniae NOT DETECTED NOT DETECTED Final   Streptococcus pyogenes NOT DETECTED NOT DETECTED Final   Acinetobacter baumannii NOT DETECTED NOT DETECTED Final   Enterobacteriaceae species NOT DETECTED NOT DETECTED Final   Enterobacter cloacae complex NOT DETECTED NOT DETECTED Final   Escherichia coli NOT DETECTED NOT DETECTED Final   Klebsiella oxytoca NOT DETECTED NOT DETECTED Final   Klebsiella pneumoniae NOT DETECTED NOT DETECTED Final   Proteus species NOT DETECTED NOT DETECTED Final   Serratia marcescens NOT DETECTED NOT DETECTED Final   Haemophilus influenzae NOT DETECTED NOT DETECTED Final   Neisseria meningitidis NOT DETECTED NOT  DETECTED Final   Pseudomonas aeruginosa NOT DETECTED NOT DETECTED Final   Candida albicans NOT DETECTED NOT DETECTED Final   Candida glabrata NOT DETECTED NOT DETECTED Final   Candida krusei NOT DETECTED NOT DETECTED Final   Candida parapsilosis NOT DETECTED NOT DETECTED Final   Candida tropicalis NOT DETECTED NOT DETECTED Final  Blood Culture (routine x 2)     Status: None (Preliminary result)   Collection Time: 08/30/16  6:59 PM  Result Value Ref Range Status   Specimen Description LEFT ANTECUBITAL  Final   Special Requests BOTTLES DRAWN AEROBIC AND  ANAEROBIC  5CC  Final   Culture  Setup Time   Final    GRAM POSITIVE COCCI IN CLUSTERS IN BOTH AEROBIC AND ANAEROBIC BOTTLES Organism ID to follow    Culture PENDING  Incomplete   Report Status PENDING  Incomplete  Blood Culture ID Panel (Reflexed)     Status: Abnormal   Collection Time: 08/30/16  6:59 PM  Result Value Ref Range Status   Enterococcus species NOT DETECTED NOT DETECTED Final   Listeria monocytogenes NOT DETECTED NOT DETECTED Final   Staphylococcus species DETECTED (A) NOT DETECTED Final    Comment: CRITICAL RESULT CALLED TO, READ BACK BY AND VERIFIED WITH: J. Rheems PHARM 08/31/16 AT 2211 BY J. FUDESCO    Staphylococcus aureus DETECTED (A) NOT DETECTED Final    Comment: CRITICAL RESULT CALLED TO, READ BACK BY AND VERIFIED WITH: J. Kim PHARM 08/31/16 AT 2211 BY J. FUDESCO    Methicillin resistance DETECTED (A) NOT DETECTED Final    Comment: CRITICAL RESULT CALLED TO, READ BACK BY AND VERIFIED WITH: J. [place] PHARM 08/31/16 AT 2211 BY J. FUDESCO    Streptococcus species NOT DETECTED NOT DETECTED Final   Streptococcus agalactiae NOT DETECTED NOT DETECTED Final   Streptococcus pneumoniae NOT DETECTED NOT DETECTED Final   Streptococcus pyogenes NOT DETECTED NOT DETECTED Final   Acinetobacter baumannii NOT DETECTED NOT DETECTED Final   Enterobacteriaceae species NOT DETECTED NOT DETECTED Final   Enterobacter cloacae complex NOT DETECTED NOT DETECTED Final   Escherichia coli NOT DETECTED NOT DETECTED Final   Klebsiella oxytoca NOT DETECTED NOT DETECTED Final   Klebsiella pneumoniae NOT DETECTED NOT DETECTED Final   Proteus species NOT DETECTED NOT DETECTED Final   Serratia marcescens NOT DETECTED NOT DETECTED Final   Haemophilus influenzae NOT DETECTED NOT DETECTED Final   Neisseria meningitidis NOT DETECTED NOT DETECTED Final   Pseudomonas aeruginosa NOT DETECTED NOT DETECTED Final   Candida albicans NOT DETECTED NOT DETECTED Final   Candida glabrata NOT DETECTED NOT  DETECTED Final   Candida krusei NOT DETECTED NOT DETECTED Final   Candida parapsilosis NOT DETECTED NOT DETECTED Final   Candida tropicalis NOT DETECTED NOT DETECTED Final  Urine culture     Status: None   Collection Time: 08/30/16  7:26 PM  Result Value Ref Range Status   Specimen Description URINE, RANDOM  Final   Special Requests NONE  Final   Culture NO GROWTH  Final   Report Status 09/01/2016 FINAL  Final    Impression/Plan:  1. Staph aureus bacteremia - now 2/2.  Repeat cultures sent.  TTE with possible mitral valve vegetation.  Agree with TEE and cardiology following.     2. Olecranon bursitis - no fluid.  On antibiotics as above for #1.  Dr. Lorin Mercy following.

## 2016-09-01 NOTE — Progress Notes (Addendum)
MD returned page stated to hold insulin and stated to give patient crackers. Pt tolerated crackers well  Andrew [name] Leory Plowman

## 2016-09-01 NOTE — Progress Notes (Signed)
Will try to arrange for TEE tomorrow to evaluate for MRSA endocarditis. Keep NPO p MN. I discussed it with him and he says he will "have to think about it". I did share my suspicion for possible valve infection and the importance of identifying this as to how antibiotic therapy will be managed. Will review it tomorrow with him.  Pixie Casino, MD, [facility] Attending Cardiologist [place]

## 2016-09-01 NOTE — Evaluation (Signed)
Occupational Therapy Evaluation Patient Details Name: Andrew [name] MRN: [id number] DOB: 1946-08-16 Today's Date: 09/01/2016    History of Present Illness Pt admitted with chest pain, fever, SOB and R elbow pain. + for MRSA bacteremia and R elbow cellulits. PMH: DM, AAA, CHF, HTN, blindness in R eye, anxiety and presumed endocarditis.    Clinical Impression   MD agreeable to OT working with pt. Pt reports being able to walk and perform basic ADL independently. His son lives with him and performs housekeeping and cooking. Pt presents with pain "all over" with poor tolerance of bed level mobility. He currently requires extensive assist for all ADL and is disoriented to time and situation. Will follow acutely. Recommending SNF for continued rehab.     Follow Up Recommendations  SNF;Supervision/Assistance - 24 hour    Equipment Recommendations  3 in 1 bedside commode    Recommendations for Other Services       Precautions / Restrictions Precautions Precautions: Fall Restrictions Weight Bearing Restrictions: No      Mobility Bed Mobility Overal bed mobility: Needs Assistance Bed Mobility: Rolling;Supine to Sit;Sit to Supine Rolling: Modified independent (Device/Increase time) (to L using rail)   Supine to sit: Total assist Sit to supine: Max assist   General bed mobility comments: Pt with poor tolerance of bed mobility.  Transfers                 General transfer comment: Pt unable to tolerate OOB.    Balance                                            ADL Overall ADL's : Needs assistance/impaired Eating/Feeding: NPO   Grooming: Oral care;Wash/dry face;Bed level;Minimal assistance;Maximal assistance Grooming Details (indicate cue type and reason): max assist for oral care, pt repeatedly dropping [place] Upper Body Bathing: Total assistance;Bed level   Lower Body Bathing: Total assistance;Bed level   Upper Body Dressing : Maximal  assistance;Bed level   Lower Body Dressing: Total assistance;Bed level               Functional mobility during ADLs:  (pt unable to walk)       Vision     Perception     Praxis      Pertinent Vitals/Pain Pain Assessment: Faces Faces Pain Scale: Hurts whole lot Pain Location: all over with any movement Pain Descriptors / Indicators: Grimacing;Guarding;Moaning Pain Intervention(s): Monitored during session;Repositioned;Limited activity within patient's tolerance     Hand Dominance Right   Extremity/Trunk Assessment Upper Extremity Assessment Upper Extremity Assessment: RUE deficits/detail;LUE deficits/detail RUE Deficits / Details: painful, intact AROM of wrist and hand, not able to tolerate elbow or shoulder movement, elevated on pillow RUE: Unable to fully assess due to pain RUE Coordination: decreased gross motor LUE Deficits / Details: generalized weakness LUE Coordination: decreased gross motor;decreased fine motor   Lower Extremity Assessment Lower Extremity Assessment: Defer to PT evaluation       Communication Communication Communication: Expressive difficulties (somewhat difficult to understand)   Cognition Arousal/Alertness: Awake/alert Behavior During Therapy: Flat affect Overall Cognitive Status: No family/caregiver present to determine baseline cognitive functioning Area of Impairment: Memory;Orientation;Problem solving Orientation Level: Disoriented to;Situation;Time   Memory: Decreased short-term memory       Problem Solving: Slow processing;Decreased initiation;Difficulty sequencing;Requires verbal cues;Requires tactile cues     General Comments  Exercises       Shoulder Instructions      Home Living Family/patient expects to be discharged to:: Private residence Living Arrangements: Children (son) Available Help at Discharge: Family (pt reports his son does not work) Type of Home: Mobile home Home Access: Stairs to  enter Technical brewer of Steps: 3 Entrance Stairs-Rails: Right;Left;Can reach both Home Layout: One level     Bathroom Shower/Tub: Teacher, early years/pre: [place]: None          Prior Functioning/Environment Level of Independence: Needs assistance  Gait / Transfers Assistance Needed: walks without a device ADL's / Homemaking Assistance Needed: performs self care independently, son does cooking and housekeeping   Comments: Questionable historian.        OT Problem List: Decreased strength;Decreased range of motion;Decreased activity tolerance;Impaired balance (sitting and/or standing);Impaired vision/perception;Decreased coordination;Decreased cognition;Decreased safety awareness;Decreased knowledge of use of DME or AE;Impaired UE functional use;Pain;Increased edema   OT Treatment/Interventions: Self-care/ADL training;Therapeutic exercise;DME and/or AE instruction;Therapeutic activities;Cognitive remediation/compensation;Patient/family education;Balance training    OT Goals(Current goals can be found in the care plan section) Acute Rehab OT Goals Patient Stated Goal: to drink water OT Goal Formulation: Patient unable to participate in goal setting Time For Goal Achievement: 09/15/16 Potential to Achieve Goals: Good ADL Goals Pt Will Perform Eating: with set-up;sitting Pt Will Perform Grooming: with min assist;standing Pt Will Perform Upper Body Dressing: with min assist;sitting Pt Will Perform Lower Body Dressing: with min assist;sit to/from stand Pt Will Transfer to Toilet: with min assist;ambulating;bedside commode Pt Will Perform Toileting - Clothing Manipulation and hygiene: with min assist;sit to/from stand Pt/caregiver will Perform Home Exercise Program: Increased ROM;Right Upper extremity;With minimal assist Additional ADL Goal #1: Pt will recall edema management techniques for R UE.  OT Frequency: Min 2X/week   Barriers to  D/C:            Co-evaluation              End of Session Nurse Communication: Mobility status  Activity Tolerance: Patient limited by pain Patient left: in bed;with call bell/phone within reach;with bed alarm set   Time: [id number] OT Time Calculation (min): 39 min Charges:  OT General Charges $OT Visit: 1 Procedure OT Evaluation $OT Eval Moderate Complexity: 1 Procedure OT Treatments $Self Care/Home Management : 8-22 mins $Therapeutic Activity: 8-22 mins G-Codes:    Malka So 09/01/2016, 10:06 AM  [phone]

## 2016-09-02 DIAGNOSIS — E43 Unspecified severe protein-calorie malnutrition: Secondary | ICD-10-CM | POA: Insufficient documentation

## 2016-09-02 DIAGNOSIS — B9561 Methicillin susceptible Staphylococcus aureus infection as the cause of diseases classified elsewhere: Secondary | ICD-10-CM

## 2016-09-02 LAB — BLOOD CULTURE ID PANEL (REFLEXED)
ACINETOBACTER BAUMANNII: NOT DETECTED
CANDIDA GLABRATA: NOT DETECTED
Candida albicans: NOT DETECTED
Candida krusei: NOT DETECTED
Candida parapsilosis: NOT DETECTED
Candida tropicalis: NOT DETECTED
ENTEROBACTER CLOACAE COMPLEX: NOT DETECTED
ENTEROBACTERIACEAE SPECIES: NOT DETECTED
ENTEROCOCCUS SPECIES: NOT DETECTED
Escherichia coli: NOT DETECTED
Haemophilus influenzae: NOT DETECTED
Klebsiella oxytoca: NOT DETECTED
Klebsiella pneumoniae: NOT DETECTED
LISTERIA MONOCYTOGENES: NOT DETECTED
Methicillin resistance: DETECTED — AB
NEISSERIA MENINGITIDIS: NOT DETECTED
PROTEUS SPECIES: NOT DETECTED
Pseudomonas aeruginosa: NOT DETECTED
STAPHYLOCOCCUS SPECIES: DETECTED — AB
STREPTOCOCCUS AGALACTIAE: NOT DETECTED
STREPTOCOCCUS SPECIES: NOT DETECTED
Serratia marcescens: NOT DETECTED
Staphylococcus aureus (BCID): DETECTED — AB
Streptococcus pneumoniae: NOT DETECTED
Streptococcus pyogenes: NOT DETECTED

## 2016-09-02 LAB — BASIC METABOLIC PANEL
ANION GAP: 11 (ref 5–15)
BUN: 13 mg/dL (ref 6–20)
CALCIUM: 8.7 mg/dL — AB (ref 8.9–10.3)
CO2: 22 mmol/L (ref 22–32)
Chloride: 98 mmol/L — ABNORMAL LOW (ref 101–111)
Creatinine, Ser: 0.53 mg/dL — ABNORMAL LOW (ref 0.61–1.24)
Glucose, Bld: 166 mg/dL — ABNORMAL HIGH (ref 65–99)
POTASSIUM: 2.8 mmol/L — AB (ref 3.5–5.1)
Sodium: 131 mmol/L — ABNORMAL LOW (ref 135–145)

## 2016-09-02 LAB — CULTURE, BLOOD (ROUTINE X 2)

## 2016-09-02 LAB — GLUCOSE, CAPILLARY
GLUCOSE-CAPILLARY: 224 mg/dL — AB (ref 65–99)
Glucose-Capillary: 456 mg/dL — ABNORMAL HIGH (ref 65–99)
Glucose-Capillary: 177 mg/dL — ABNORMAL HIGH (ref 65–99)
Glucose-Capillary: 92 mg/dL (ref 65–99)

## 2016-09-02 LAB — CBC
HCT: 39.6 % (ref 39.0–52.0)
Hemoglobin: 13.8 g/dL (ref 13.0–17.0)
MCH: 28.6 pg (ref 26.0–34.0)
MCHC: 34.8 g/dL (ref 30.0–36.0)
MCV: 82 fL (ref 78.0–100.0)
Platelets: 238 10*3/uL (ref 150–400)
RBC: 4.83 MIL/uL (ref 4.22–5.81)
RDW: 14.2 % (ref 11.5–15.5)
WBC: 14.2 10*3/uL — AB (ref 4.0–10.5)

## 2016-09-02 LAB — VANCOMYCIN, TROUGH: Vancomycin Tr: 10 ug/mL — ABNORMAL LOW (ref 15–20)

## 2016-09-02 LAB — MAGNESIUM: Magnesium: 1.6 mg/dL — ABNORMAL LOW (ref 1.7–2.4)

## 2016-09-02 MED ORDER — MAGNESIUM SULFATE 2 GM/50ML IV SOLN
2.0000 g | Freq: Once | INTRAVENOUS | Status: AC
  Administered 2016-09-02: 2 g via INTRAVENOUS
  Filled 2016-09-02: qty 50

## 2016-09-02 MED ORDER — GLUCERNA SHAKE PO LIQD
237.0000 mL | Freq: Three times a day (TID) | ORAL | Status: DC
  Administered 2016-09-03 – 2016-09-07 (×9): 237 mL via ORAL

## 2016-09-02 MED ORDER — INSULIN GLARGINE 100 UNIT/ML ~~LOC~~ SOLN
10.0000 [IU] | Freq: Every day | SUBCUTANEOUS | Status: DC
  Administered 2016-09-02: 10 [IU] via SUBCUTANEOUS
  Filled 2016-09-02: qty 0.1

## 2016-09-02 MED ORDER — INSULIN ASPART 100 UNIT/ML ~~LOC~~ SOLN
0.0000 [IU] | Freq: Three times a day (TID) | SUBCUTANEOUS | Status: DC
  Administered 2016-09-02: 3 [IU] via SUBCUTANEOUS
  Administered 2016-09-02: 15 [IU] via SUBCUTANEOUS
  Administered 2016-09-03 (×2): 3 [IU] via SUBCUTANEOUS
  Administered 2016-09-03: 2 [IU] via SUBCUTANEOUS
  Administered 2016-09-04 (×2): 3 [IU] via SUBCUTANEOUS
  Administered 2016-09-04: 5 [IU] via SUBCUTANEOUS
  Administered 2016-09-05: 3 [IU] via SUBCUTANEOUS
  Administered 2016-09-05: 5 [IU] via SUBCUTANEOUS
  Administered 2016-09-05: 11 [IU] via SUBCUTANEOUS
  Administered 2016-09-06: 2 [IU] via SUBCUTANEOUS
  Administered 2016-09-06: 3 [IU] via SUBCUTANEOUS

## 2016-09-02 MED ORDER — POTASSIUM CHLORIDE CRYS ER 20 MEQ PO TBCR
40.0000 meq | EXTENDED_RELEASE_TABLET | Freq: Two times a day (BID) | ORAL | Status: DC
  Administered 2016-09-02 – 2016-09-05 (×7): 40 meq via ORAL
  Filled 2016-09-02 (×7): qty 2

## 2016-09-02 MED ORDER — SODIUM CHLORIDE 0.9 % IV SOLN
1250.0000 mg | Freq: Two times a day (BID) | INTRAVENOUS | Status: DC
  Administered 2016-09-02 – 2016-09-07 (×11): 1250 mg via INTRAVENOUS
  Filled 2016-09-02 (×12): qty 1250

## 2016-09-02 MED ORDER — INSULIN GLARGINE 100 UNIT/ML ~~LOC~~ SOLN: 10.0000 [IU] | Freq: Every day | SUBCUTANEOUS | Status: DC

## 2016-09-02 MED ORDER — INSULIN ASPART 100 UNIT/ML ~~LOC~~ SOLN
3.0000 [IU] | Freq: Three times a day (TID) | SUBCUTANEOUS | Status: DC
  Administered 2016-09-02 – 2016-09-05 (×7): 3 [IU] via SUBCUTANEOUS

## 2016-09-02 NOTE — Progress Notes (Signed)
Pharmacist concerned about pt long acting insulin not resumed, paged MD Awaiting call back  Keith Felten Leory Plowman

## 2016-09-02 NOTE — Evaluation (Addendum)
Physical Therapy Evaluation Patient Details Name: Andrew [name] MRN: [id number] DOB: 01-Aug-1946 Today's Date: 09/02/2016   History of Present Illness  Pt admitted with chest pain, fever, SOB and R elbow pain. + for MRSA bacteremia and R elbow cellulits. PMH: DM, AAA, CHF, HTN, blindness in R eye, anxiety and presumed endocarditis.   Clinical Impression  Patient demonstrates deficits in functional mobility as indicated below. Will need continued skilled PT to address deficits and maximize function. Will see as indicated and progress as tolerated.     Follow Up Recommendations SNF;Supervision/Assistance - 24 hour    Equipment Recommendations   (TBD)    Recommendations for Other Services       Precautions / Restrictions Precautions Precautions: Fall Restrictions Weight Bearing Restrictions: No      Mobility  Bed Mobility Overal bed mobility: Needs Assistance Bed Mobility: Rolling;Supine to Sit;Sit to Supine Rolling: Max assist   Supine to sit: Max assist Sit to supine: Mod assist   General bed mobility comments: Pt with poor tolerance of bed mobility.  Transfers Overall transfer level: Needs assistance Equipment used: 2 person hand held assist Transfers: Sit to/from Stand Sit to Stand: Max assist;+2 physical assistance         General transfer comment: face to face, increase time and effort, manual assist for positioning of LEs prior to standing, assist to elevate to standing  Ambulation/Gait             General Gait Details: unable to perform at this time  Stairs            Wheelchair Mobility    Modified Rankin (Stroke Patients Only)       Balance Overall balance assessment: Needs assistance Sitting-balance support: Feet supported Sitting balance-Leahy Scale: Poor Sitting balance - Comments: patient falling off to the left Postural control: Left lateral lean Standing balance support: Bilateral upper extremity supported;During functional  activity Standing balance-Leahy Scale: Zero                               Pertinent Vitals/Pain Pain Assessment: 0-10 Faces Pain Scale: Hurts whole lot Pain Location: right arm Pain Descriptors / Indicators: Grimacing;Guarding Pain Intervention(s): Monitored during session    Home Living Family/patient expects to be discharged to:: Private residence Living Arrangements: Children (son) Available Help at Discharge: Family (pt reports his son does not work) Type of Home: Mobile home Home Access: Stairs to enter Entrance Stairs-Rails: Right;Left;Can reach both Technical brewer of Steps: 3 Home Layout: One level Home Equipment: None      Prior Function Level of Independence: Needs assistance   Gait / Transfers Assistance Needed: walks without a device  ADL's / Homemaking Assistance Needed: performs self care independently, son does cooking and housekeeping  Comments: Questionable historian.     Hand Dominance   Dominant Hand: Right    Extremity/Trunk Assessment   Upper Extremity Assessment Upper Extremity Assessment: RUE deficits/detail;Difficult to assess due to impaired cognition RUE Deficits / Details: swollen and painful RUE Coordination: decreased gross motor LUE Deficits / Details: generalized weakness LUE Coordination: decreased gross motor;decreased fine motor    Lower Extremity Assessment Lower Extremity Assessment: Generalized weakness;Difficult to assess due to impaired cognition       Communication   Communication: Expressive difficulties (somewhat difficult to understand)  Cognition Arousal/Alertness: Awake/alert Behavior During Therapy: Flat affect Overall Cognitive Status: No family/caregiver present to determine baseline cognitive functioning Area of Impairment: Memory;Orientation;Problem solving  Orientation Level: Disoriented to;Situation;Time   Memory: Decreased short-term memory       Problem Solving: Slow  processing;Decreased initiation;Difficulty sequencing;Requires verbal cues;Requires tactile cues      General Comments      Exercises     Assessment/Plan    PT Assessment Patient needs continued PT services  PT Problem List Decreased strength;Decreased range of motion;Decreased activity tolerance;Decreased balance;Decreased mobility;Decreased cognition;Decreased safety awareness;Pain          PT Treatment Interventions DME instruction;Gait training;Functional mobility training;Therapeutic activities;Therapeutic exercise;Balance training;Neuromuscular re-education;Patient/family education    PT Goals (Current goals can be found in the Care Plan section)  Acute Rehab PT Goals Patient Stated Goal: to sleep PT Goal Formulation: With patient Time For Goal Achievement: 09/16/16 Potential to Achieve Goals: Fair    Frequency Min 2X/week   Barriers to discharge        Co-evaluation               End of Session Equipment Utilized During Treatment: Gait belt Activity Tolerance: Patient limited by fatigue;Patient limited by pain Patient left: in bed;with call bell/phone within reach;with bed alarm set;with nursing/sitter in room Nurse Communication: Mobility status         Time: [id number] PT Time Calculation (min) (ACUTE ONLY): 19 min   Charges:   PT Evaluation $PT Eval Moderate Complexity: 1 Procedure     PT G Codes:        Duncan Dull 2016-09-09, 3:27 PM  Alben Deeds, [place] DPT  [phone]

## 2016-09-02 NOTE — Progress Notes (Signed)
Inpatient Diabetes Program Recommendations  AACE/ADA: New Consensus Statement on Inpatient Glycemic Control (2015)  Target Ranges:  Prepandial:   less than 140 mg/dL      Peak postprandial:   less than 180 mg/dL (1-2 hours)      Critically ill patients:  140 - 180 mg/dL   Lab Results  Component Value Date   GLUCAP 456 (H) 09/02/2016   HGBA1C >15.5 (H) 08/31/2016    Review of Glycemic Control Blood sugar WNL on 1/16. Did not receive any Lantus on 1/16. Had hypoglycemia on 1/15 with Lantus 40 units QD. Post-prandial blood sugar 456 at lunch. Needs insulin adjustment. Eating 50% meal.  Inpatient Diabetes Program Recommendations:    Add basal insulin - Lantus 20 units QD. Add meal coverage insulin - Novolog 4 units tidwc if pt eats > 50% meals.  Will follow. Thank you. Lorenda Peck, RD, LDN, CDE Inpatient Diabetes Coordinator [phone]

## 2016-09-02 NOTE — Progress Notes (Signed)
Paged MD regarding pt continuous fluid. Pt now regular diet and blood sugars are within normal limits and higher.  MD Doyle Askew stated to discontinue  Andrew [name] Leory Plowman

## 2016-09-02 NOTE — Progress Notes (Signed)
Returned page from MD she stated to give 18 units of insulin and to discontinue the blood draw for recheck  Andrew [name] Leory Plowman

## 2016-09-02 NOTE — Progress Notes (Signed)
Patient Name: Andrew [name] Date of Encounter: 09/02/2016  Primary Cardiologist: Dr. [facility] Problem List     Principal Problem:   Bacteremia due to methicillin resistant Staphylococcus aureus Active Problems:   Alcohol abuse   Essential hypertension   Sepsis ([place])   Chest pain   AKI (acute kidney injury) ([place])   Septic olecranon bursitis of right elbow   Chronic diastolic CHF (congestive heart failure) ([facility])   Elevated troponin   Pressure injury of skin     Subjective   Resting comfortably. No complaints.   Inpatient Medications    Scheduled Meds: . amLODipine  10 mg Oral Daily  . aspirin  325 mg Oral Daily  . atorvastatin  40 mg Oral q1800  . enoxaparin (LOVENOX) injection  40 mg Subcutaneous Q24H  . feeding supplement (ENSURE ENLIVE)  237 mL Oral TID WC  . gabapentin  100 mg Oral TID  . insulin aspart  0-9 Units Subcutaneous TID WC  . mouth rinse  15 mL Mouth Rinse BID  . metoprolol succinate  100 mg Oral Daily  . multivitamin with minerals  1 tablet Oral Daily  . mupirocin cream   Topical Daily  . thiamine  100 mg Oral Daily  . vancomycin  750 mg Intravenous Q12H   Continuous Infusions:  PRN Meds: acetaminophen, albuterol, morphine injection, nitroGLYCERIN, ondansetron (ZOFRAN) IV, oxyCODONE-acetaminophen, zolpidem   Vital Signs    Vitals:   09/01/16 0451 09/01/16 1231 09/01/16 2009 09/02/16 0455  BP: (!) 141/63 131/62 134/70 138/61  Pulse: 84 77 82   Resp: 18 18 18 18   Temp: (!) 100.5 F (38.1 C) 99 F (37.2 C) 98.4 F (36.9 C) 98.2 F (36.8 C)  TempSrc: Rectal Oral Oral Oral  SpO2: 99% 99% 97% 91%  Weight: 144 lb 10 oz (65.6 kg)   145 lb 1 oz (65.8 kg)  Height:        Intake/Output Summary (Last 24 hours) at 09/02/16 0936 Last data filed at 09/02/16 0500  Gross per 24 hour  Intake             2430 ml  Output              850 ml  Net             1580 ml   Filed Weights   08/31/16 0554 09/01/16 0451 09/02/16 0455  Weight:  148 lb 2.4 oz (67.2 kg) 144 lb 10 oz (65.6 kg) 145 lb 1 oz (65.8 kg)    Physical Exam   GEN: Well nourished, well developed, in no acute distress.  HEENT: right eye blindness   Neck: Supple, no JVD, carotid bruits, or masses. Cardiac: RRR, no murmurs, rubs, or gallops. No clubbing, cyanosis, edema.  Radials/DP/PT 2+ and equal bilaterally.  Respiratory:  Respirations regular and unlabored, clear to auscultation bilaterally. GI: Soft, nontender, nondistended, BS + x 4. MS: swollen right elbow, slightly erythematous  Skin: warm and dry, no rash. Neuro:  Strength and sensation are intact. Psych: AAOx3.  Normal affect.  Labs    CBC  Recent Labs  09/01/16 0322 09/02/16 0514  WBC 17.0* 14.2*  HGB 14.5 13.8  HCT 40.0 39.6  MCV 81.3 82.0  PLT 251 [id number]   Basic Metabolic Panel  Recent Labs  09/01/16 0322 09/01/16 0624 09/02/16 0514  NA 133*  --  131*  K 2.4*  --  2.8*  CL 100*  --  98*  CO2 22  --  22  GLUCOSE 82  --  166*  BUN 16  --  13  CREATININE 0.50*  --  0.53*  CALCIUM 8.7*  --  8.7*  MG  --  1.6* 1.6*   Liver Function Tests No results for input(s): AST, ALT, ALKPHOS, BILITOT, PROT, ALBUMIN in the last 72 hours. No results for input(s): LIPASE, AMYLASE in the last 72 hours. Cardiac Enzymes  Recent Labs  08/30/16 2216 08/31/16 0231 08/31/16 0608  TROPONINI 0.07* 0.07* 0.21*   BNP Invalid input(s): POCBNP D-Dimer  Recent Labs  08/30/16 2216  DDIMER 2.64*   Hemoglobin A1C  Recent Labs  08/31/16 0231  HGBA1C >15.5*   Fasting Lipid Panel  Recent Labs  08/31/16 0231  CHOL 108  HDL 44  LDLCALC 49  TRIG 75  CHOLHDL 2.5   Thyroid Function Tests No results for input(s): TSH, T4TOTAL, T3FREE, THYROIDAB in the last 72 hours.  Invalid input(s): FREET3  Telemetry    NSR - Personally Reviewed   Radiology    No results found.   Patient Profile     Andrew [name] is a 71 y.o. male with a history of HTN, HLD, DMT2, AAA (4.8cm on MRI  03/2015), right eye blindness, chronic diastolic CHF, tobacco abuse and alcohol abuse who presented to [facility] on 08/30/16 with chest pain and right elbow pain. Found to have septic olecranon bursitis of his right elbow complicated by MRSA bacteremia. Cardiology consulted for chest pain and elevated troponin.   Assessment & Plan    1. Chest Pain/ Elevated Troponin: Troponin trend 0.07>>0.07>>0.21. Echo shows normal LVF. Suspect troponin elevation due to demand ischemia vs. Sepsis  2. Septic olecranon bursitis of his right elbow complicated by MRSA bacteremia: continue IV abx per ID.  Ortho consulted and attempted aspirating but only trace blood obtained, no pocket of fluid observed.  TTE showed possible mitral valve vegetation. TEE advised to further evaluate and r/o MRSA endocarditis. Now scheduled for 09/03/16 with Dr. Radford Pax @ 1500.   3. HTN: Bp stable currently.   4. DMT2: per IM  Signed, Lyda Jester, PA-C  09/02/2016, 9:36 AM

## 2016-09-02 NOTE — Progress Notes (Signed)
Patient right upper extremities firm, red and  painful to touch. IV infiltrated during shift changed last night.  IV removed. MD notified.

## 2016-09-02 NOTE — Progress Notes (Signed)
Reviewed insulin order with pharmacist, stated to keep additional 3 units with sliding scale per order for patient.  Cambrie Sonnenfeld

## 2016-09-02 NOTE — Progress Notes (Addendum)
Patient ID: Andrew [name], male   DOB: Jan 21, 1946, 71 y.o.   MRN: [id number]    PROGRESS NOTE    Andrew [name]  EHU:[id number] DOB: 04-Jan-1946 DOA: 08/30/2016  PCP: Woody Seller, MD   Brief Narrative:  71 y.o. male with hypertension, hyperlipidemia, diabetes mellitus, anxiety, AAA premises 4.8 cm on MRI 04/09/15), right eye blindness, dCHF, who presented with two days duration of substernal and pressure like chest pain, SOB and right elbow pain.  ED Course: pt was found to have WBC 24.8, lactate of 4.85, troponin 0.07, acute renal injury with Cr 1.28, hyponatremia with sodium 129. Chest x-ray showed patchy atelectasis in the left base without infiltration. D-dimer positive, but CT angiograms negative for PE or infiltration.   # CTA of chest: 1. No CT evidence for acute pulmonary embolus. 2. No acute infiltrates. Mild emphysematous disease. 3. 3 mm right upper lobe pulmonary nodule.  4. Left adrenal gland enlarged with ? nodules. Follow-up CT with adrenal protocol for further evaluation.  Assessment & Plan:  Chest pain and positive trop - CT angiogram is negative for PE and infiltration - possible demands ischemia in the setting of sepsis but pt with underlying risk factors so will need monitoring  - initial troponin 0.07 --> 0.21 this AM - cardio following, planned for TEE but pt has declined  - no need to cycle trop's further  - cardiology signed off today - pt with no CP this AM   Sepsis secondary to R olecranon bursitis, MRSA bacteremia  - pt met criteria for sepsis with elevated lactate, leukocytosis, tachycardia and tachypnea.  - continue vanc day #4 - appreciate ID team assistance, ortho consulted and attempted aspirating but only trace blood obtained, no pocket of fluid observed  - appreciate Dr. Lorin Mercy assistance  - pt still with Tmax 101.1 F and WBC 24 K on admission which is now down 46 --> 14 K - repeat blood cultures obtained 1/16, will monitor for results    - pt declined TEE  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  - hypoglycemic on 11/16 - has been NPO due to lethargy - SLP done, advance diet to regular  - increase insulin coverage once oral intake improves   Chronic diastolic CHF (congestive heart failure) - 2-D echo 04/09/15 showed EF of 65-70% with grade 1 diastolic dysfunction - new ECHO requested on this admission - monitor volume status closely  - Continue metoprolol and aspirin - strict I/O, daily weights  - weight trend since admission: 148 --> 145 lbs  Acute metabolic encephalopathy - from sepsis  - more alert this AM - advance diet  - PT eval also needed   Hypokalemia - Mg also low, will continue to supplement  - repeat BMP and Mg in AM  Pressure injury - 3 full thickness abrasions, left elbow; .3X.3cm, Left outer elbow .5X2.5X.1cm and .8X.5X.1cm - WOC recommendations is to use Bactroban to provide antimicrobial benefits and promote moist healing, foam dressing to protect from further injury - inner gluteal fold and sacrum is red and moist; appearance consistent with moisture associated skin damage. - recommend foam dressing to protect from further injury  Alcohol abuse - in remission  Essential hypertension:  - Amlodipine, metoprolol, - Hold lisinopril due to AKI - so far reasonable inpatient control   AKI, hyponatremia, metabolic acidosis  - Cr 7.85. Likely due to prerenal secondary to dehydration, sepsis - continue to hold Lisinopril - Cr is now WNL,  CO2 also WNL - BMP In AM  DVT prophylaxis: Lovenox SQ Code Status: Full  Family Communication: Patient at bedside, no family at bedside  Disposition Plan: to be determined   Consultants:   Cardiology   ID  Ortho   Procedures:   None  Antimicrobials:   Vancomycin 1/14 -->  Zosyn 1/14 --> 1/15   Subjective: Still with right elbow pain and intermittent chest pains.   Objective: Vitals:   09/01/16 0451  09/01/16 1231 09/01/16 2009 09/02/16 0455  BP: (!) 141/63 131/62 134/70 138/61  Pulse: 84 77 82   Resp: 18 18 18 18   Temp: (!) 100.5 F (38.1 C) 99 F (37.2 C) 98.4 F (36.9 C) 98.2 F (36.8 C)  TempSrc: Rectal Oral Oral Oral  SpO2: 99% 99% 97% 91%  Weight: 65.6 kg (144 lb 10 oz)   65.8 kg (145 lb 1 oz)  Height:        Intake/Output Summary (Last 24 hours) at 09/02/16 1058 Last data filed at 09/02/16 1000  Gross per 24 hour  Intake          2949.17 ml  Output              850 ml  Net          2099.17 ml   Filed Weights   08/31/16 0554 09/01/16 0451 09/02/16 0455  Weight: 67.2 kg (148 lb 2.4 oz) 65.6 kg (144 lb 10 oz) 65.8 kg (145 lb 1 oz)    Examination:  General exam: Appears more alert, oriented to name and DOB only  Respiratory system: diminished breath sounds at bases  Cardiovascular system: RRR. No JVD, murmurs, rubs, gallops or clicks. No pedal edema. Gastrointestinal system: Abdomen is nondistended, soft and nontender. No organomegaly or masses felt. Central nervous system: Right elbow with mild erythema and still TTP  Data Reviewed: I have personally reviewed following labs and imaging studies  CBC:  Recent Labs Lab 08/30/16 1754 09/01/16 0322 09/02/16 0514  WBC 24.8* 17.0* 14.2*  HGB 16.5 14.5 13.8  HCT 45.4 40.0 39.6  MCV 82.8 81.3 82.0  PLT 309 251 010   Basic Metabolic Panel:  Recent Labs Lab 08/30/16 1754 09/01/16 0322 09/01/16 0624 09/02/16 0514  NA 129* 133*  --  131*  K 4.2 2.4*  --  2.8*  CL 92* 100*  --  98*  CO2 17* 22  --  22  GLUCOSE 279* 82  --  166*  BUN 29* 16  --  13  CREATININE 1.28* 0.50*  --  0.53*  CALCIUM 9.9 8.7*  --  8.7*  MG  --   --  1.6* 1.6*   Coagulation Profile:  Recent Labs Lab 08/30/16 2216  INR 1.19   Cardiac Enzymes:  Recent Labs Lab 08/30/16 2216 08/31/16 0231 08/31/16 0608  TROPONINI 0.07* 0.07* 0.21*   CBG:  Recent Labs Lab 09/01/16 0848 09/01/16 1101 09/01/16 1621 09/01/16 2100  09/02/16 0635  GLUCAP 106* 94 184* 154* 224*   Lipid Profile:  Recent Labs  08/31/16 0231  CHOL 108  HDL 44  LDLCALC 49  TRIG 75  CHOLHDL 2.5   Urine analysis:    Component Value Date/Time   COLORURINE YELLOW 08/30/2016 1926   APPEARANCEUR HAZY (A) 08/30/2016 1926   LABSPEC 1.020 08/30/2016 1926   PHURINE 5.0 08/30/2016 1926   GLUCOSEU >=500 (A) 08/30/2016 1926   HGBUR MODERATE (A) 08/30/2016 1926   BILIRUBINUR NEGATIVE 08/30/2016 1926   KETONESUR 20 (A)  08/30/2016 1926   PROTEINUR 100 (A) 08/30/2016 1926   UROBILINOGEN 0.2 04/18/2015 0210   NITRITE NEGATIVE 08/30/2016 1926   LEUKOCYTESUR NEGATIVE 08/30/2016 1926   Recent Results (from the past 240 hour(s))  Blood Culture (routine x 2)     Status: Abnormal   Collection Time: 08/30/16  6:48 PM  Result Value Ref Range Status   Specimen Description RIGHT ANTECUBITAL  Final   Special Requests BOTTLES DRAWN AEROBIC AND ANAEROBIC 5CC  Final   Culture  Setup Time   Final    GRAM POSITIVE COCCI IN CLUSTERS IN BOTH AEROBIC AND ANAEROBIC BOTTLES CRITICAL RESULT CALLED TO, READ BACK BY AND VERIFIED WITH: N. Batchelder Pharm.D. 11:35 08/31/16  ([email or web address])    Culture METHICILLIN RESISTANT STAPHYLOCOCCUS AUREUS (A)  Final   Report Status 09/02/2016 FINAL  Final   Organism ID, Bacteria METHICILLIN RESISTANT STAPHYLOCOCCUS AUREUS  Final      Susceptibility   Methicillin resistant staphylococcus aureus - MIC*    CIPROFLOXACIN <=0.5 SENSITIVE Sensitive     ERYTHROMYCIN >=8 RESISTANT Resistant     GENTAMICIN <=0.5 SENSITIVE Sensitive     OXACILLIN >=4 RESISTANT Resistant     TETRACYCLINE <=1 SENSITIVE Sensitive     VANCOMYCIN <=0.5 SENSITIVE Sensitive     TRIMETH/SULFA <=10 SENSITIVE Sensitive     CLINDAMYCIN <=0.25 SENSITIVE Sensitive     RIFAMPIN <=0.5 SENSITIVE Sensitive     Inducible Clindamycin NEGATIVE Sensitive     * METHICILLIN RESISTANT STAPHYLOCOCCUS AUREUS  Blood Culture ID Panel (Reflexed)     Status: Abnormal    Collection Time: 08/30/16  6:48 PM  Result Value Ref Range Status   Enterococcus species NOT DETECTED NOT DETECTED Final   Listeria monocytogenes NOT DETECTED NOT DETECTED Final   Staphylococcus species DETECTED (A) NOT DETECTED Final    Comment: CRITICAL RESULT CALLED TO, READ BACK BY AND VERIFIED WITH: N. Batchelder Pharm.D. 11:35 08/31/16 ([email or web address])    Staphylococcus aureus DETECTED (A) NOT DETECTED Final    Comment: CRITICAL RESULT CALLED TO, READ BACK BY AND VERIFIED WITH: N. Batchelder Pharm.D. 11:35 08/31/16 (wilsonm)    Methicillin resistance DETECTED (A) NOT DETECTED Final    Comment: CRITICAL RESULT CALLED TO, READ BACK BY AND VERIFIED WITH: N. Batchelder Pharm.D. 11:35 08/31/16 ([email or web address])    Streptococcus species NOT DETECTED NOT DETECTED Final   Streptococcus agalactiae NOT DETECTED NOT DETECTED Final   Streptococcus pneumoniae NOT DETECTED NOT DETECTED Final   Streptococcus pyogenes NOT DETECTED NOT DETECTED Final   Acinetobacter baumannii NOT DETECTED NOT DETECTED Final   Enterobacteriaceae species NOT DETECTED NOT DETECTED Final   Enterobacter cloacae complex NOT DETECTED NOT DETECTED Final   Escherichia coli NOT DETECTED NOT DETECTED Final   Klebsiella oxytoca NOT DETECTED NOT DETECTED Final   Klebsiella pneumoniae NOT DETECTED NOT DETECTED Final   Proteus species NOT DETECTED NOT DETECTED Final   Serratia marcescens NOT DETECTED NOT DETECTED Final   Haemophilus influenzae NOT DETECTED NOT DETECTED Final   Neisseria meningitidis NOT DETECTED NOT DETECTED Final   Pseudomonas aeruginosa NOT DETECTED NOT DETECTED Final   Candida albicans NOT DETECTED NOT DETECTED Final   Candida glabrata NOT DETECTED NOT DETECTED Final   Candida krusei NOT DETECTED NOT DETECTED Final   Candida parapsilosis NOT DETECTED NOT DETECTED Final   Candida tropicalis NOT DETECTED NOT DETECTED Final  Blood Culture (routine x 2)     Status: Abnormal   Collection Time: 08/30/16  6:59 PM  Result  Value Ref Range Status   Specimen  Description LEFT ANTECUBITAL  Final   Special Requests BOTTLES DRAWN AEROBIC AND ANAEROBIC  5CC  Final   Culture  Setup Time   Final    GRAM POSITIVE COCCI IN CLUSTERS IN BOTH AEROBIC AND ANAEROBIC BOTTLES CRITICAL VALUE NOTED.  VALUE IS CONSISTENT WITH PREVIOUSLY REPORTED AND CALLED VALUE.    Culture (A)  Final    STAPHYLOCOCCUS AUREUS SUSCEPTIBILITIES PERFORMED ON PREVIOUS CULTURE WITHIN THE LAST 5 DAYS.    Report Status 09/02/2016 FINAL  Final  Blood Culture ID Panel (Reflexed)     Status: Abnormal   Collection Time: 08/30/16  6:59 PM  Result Value Ref Range Status   Enterococcus species NOT DETECTED NOT DETECTED Final   Listeria monocytogenes NOT DETECTED NOT DETECTED Final   Staphylococcus species DETECTED (A) NOT DETECTED Final    Comment: CRITICAL RESULT CALLED TO, READ BACK BY AND VERIFIED WITH: J. [place] [facility] 08/31/16 AT 2211 BY J. FUDESCO    Staphylococcus aureus DETECTED (A) NOT DETECTED Final    Comment: CRITICAL RESULT CALLED TO, READ BACK BY AND VERIFIED WITH: J. [place] [facility] 08/31/16 AT 2211 BY J. FUDESCO    Methicillin resistance DETECTED (A) NOT DETECTED Final    Comment: CRITICAL RESULT CALLED TO, READ BACK BY AND VERIFIED WITH: J.  PHARM 08/31/16 AT 2211 BY J. FUDESCO    Streptococcus species NOT DETECTED NOT DETECTED Final   Streptococcus agalactiae NOT DETECTED NOT DETECTED Final   Streptococcus pneumoniae NOT DETECTED NOT DETECTED Final   Streptococcus pyogenes NOT DETECTED NOT DETECTED Final   Acinetobacter baumannii NOT DETECTED NOT DETECTED Final   Enterobacteriaceae species NOT DETECTED NOT DETECTED Final   Enterobacter cloacae complex NOT DETECTED NOT DETECTED Final   Escherichia coli NOT DETECTED NOT DETECTED Final   Klebsiella oxytoca NOT DETECTED NOT DETECTED Final   Klebsiella pneumoniae NOT DETECTED NOT DETECTED Final   Proteus species NOT DETECTED NOT DETECTED Final   Serratia marcescens NOT DETECTED NOT  DETECTED Final   Haemophilus influenzae NOT DETECTED NOT DETECTED Final   Neisseria meningitidis NOT DETECTED NOT DETECTED Final   Pseudomonas aeruginosa NOT DETECTED NOT DETECTED Final   Candida albicans NOT DETECTED NOT DETECTED Final   Candida glabrata NOT DETECTED NOT DETECTED Final   Candida krusei NOT DETECTED NOT DETECTED Final   Candida parapsilosis NOT DETECTED NOT DETECTED Final   Candida tropicalis NOT DETECTED NOT DETECTED Final  Urine culture     Status: None   Collection Time: 08/30/16  7:26 PM  Result Value Ref Range Status   Specimen Description URINE, RANDOM  Final   Special Requests NONE  Final   Culture NO GROWTH  Final   Report Status 09/01/2016 FINAL  Final  Culture, blood (routine x 2)     Status: None (Preliminary result)   Collection Time: 09/01/16  3:22 AM  Result Value Ref Range Status   Specimen Description BLOOD LEFT ARM  Final   Special Requests IN PEDIATRIC BOTTLE 2CC  Final   Culture  Setup Time   Final    GRAM POSITIVE COCCI IN CLUSTERS PEDIATRICS Organism ID to follow CRITICAL RESULT CALLED TO, READ BACK BY AND VERIFIED WITH: TO VBYRK(PHARD) BY TCLEVELAND 09/02/2016 AT 00:44 AM    Culture NO GROWTH < 12 HOURS  Final   Report Status PENDING  Incomplete  Blood Culture ID Panel (Reflexed)     Status: Abnormal   Collection Time: 09/01/16  3:22 AM  Result Value Ref Range Status   Enterococcus species NOT DETECTED  NOT DETECTED Final   Listeria monocytogenes NOT DETECTED NOT DETECTED Final   Staphylococcus species DETECTED (A) NOT DETECTED Final    Comment: CRITICAL RESULT CALLED TO, READ BACK BY AND VERIFIED WITH: TO VBYRK(PHARMD) BY TCLEVELAND 09/02/2016 AT 12:44AM    Staphylococcus aureus DETECTED (A) NOT DETECTED Final    Comment: CRITICAL RESULT CALLED TO, READ BACK BY AND VERIFIED WITH: TO VBYRK(PHARMD) BY TCLEVELAND 09/02/2016 AT 12:44AM    Methicillin resistance DETECTED (A) NOT DETECTED Final    Comment: CRITICAL RESULT CALLED TO, READ BACK  BY AND VERIFIED WITH: TO VBYRK(PHARMD) BY TCLEVELAND 09/02/2016 AT 12:44AM    Streptococcus species NOT DETECTED NOT DETECTED Final   Streptococcus agalactiae NOT DETECTED NOT DETECTED Final   Streptococcus pneumoniae NOT DETECTED NOT DETECTED Final   Streptococcus pyogenes NOT DETECTED NOT DETECTED Final   Acinetobacter baumannii NOT DETECTED NOT DETECTED Final   Enterobacteriaceae species NOT DETECTED NOT DETECTED Final   Enterobacter cloacae complex NOT DETECTED NOT DETECTED Final   Escherichia coli NOT DETECTED NOT DETECTED Final   Klebsiella oxytoca NOT DETECTED NOT DETECTED Final   Klebsiella pneumoniae NOT DETECTED NOT DETECTED Final   Proteus species NOT DETECTED NOT DETECTED Final   Serratia marcescens NOT DETECTED NOT DETECTED Final   Haemophilus influenzae NOT DETECTED NOT DETECTED Final   Neisseria meningitidis NOT DETECTED NOT DETECTED Final   Pseudomonas aeruginosa NOT DETECTED NOT DETECTED Final   Candida albicans NOT DETECTED NOT DETECTED Final   Candida glabrata NOT DETECTED NOT DETECTED Final   Candida krusei NOT DETECTED NOT DETECTED Final   Candida parapsilosis NOT DETECTED NOT DETECTED Final   Candida tropicalis NOT DETECTED NOT DETECTED Final  Culture, blood (routine x 2)     Status: None (Preliminary result)   Collection Time: 09/01/16  6:24 AM  Result Value Ref Range Status   Specimen Description BLOOD BLOOD RIGHT HAND  Final   Special Requests BOTTLES DRAWN AEROBIC AND ANAEROBIC 5CC  Final   Culture NO GROWTH < 12 HOURS  Final   Report Status PENDING  Incomplete    Radiology Studies: No results found.  Scheduled Meds: . amLODipine  10 mg Oral Daily  . aspirin  325 mg Oral Daily  . atorvastatin  40 mg Oral q1800  . enoxaparin (LOVENOX) injection  40 mg Subcutaneous Q24H  . feeding supplement (ENSURE ENLIVE)  237 mL Oral TID WC  . gabapentin  100 mg Oral TID  . insulin aspart  0-9 Units Subcutaneous TID WC  . mouth rinse  15 mL Mouth Rinse BID  .  metoprolol succinate  100 mg Oral Daily  . multivitamin with minerals  1 tablet Oral Daily  . mupirocin cream   Topical Daily  . thiamine  100 mg Oral Daily  . vancomycin  750 mg Intravenous Q12H   Continuous Infusions:   LOS: 3 days   Time spent: 20 minutes   Faye Ramsay, MD [facility] Pager [phone]  If 7PM-7AM, please contact night-coverage [email or web address] Password [email or web address] 09/02/2016, 10:58 AM

## 2016-09-02 NOTE — Progress Notes (Signed)
[facility] [place] for Infectious Disease   Reason for visit: Follow up on Staph aureus bacteremia  Interval History: refuses TEE; afebrile  Physical Exam: Constitutional:  Vitals:   09/01/16 2009 09/02/16 0455  BP: 134/70 138/61  Pulse: 82   Resp: 18 18  Temp: 98.4 F (36.9 C) 98.2 F (36.8 C)   patient appears in NAD Respiratory: Normal respiratory effort; CTA B Cardiovascular: RRR GI: soft, nt MS: right elbow with some warmth, mild erythema  Review of Systems: Constitutional: no fever, no chills  Lab Results  Component Value Date   WBC 14.2 (H) 09/02/2016   HGB 13.8 09/02/2016   HCT 39.6 09/02/2016   MCV 82.0 09/02/2016   PLT 238 09/02/2016    Lab Results  Component Value Date   CREATININE 0.53 (L) 09/02/2016   BUN 13 09/02/2016   NA 131 (L) 09/02/2016   K 2.8 (L) 09/02/2016   CL 98 (L) 09/02/2016   CO2 22 09/02/2016    Lab Results  Component Value Date   ALT 33 04/18/2015   AST 28 04/18/2015   ALKPHOS 183 (H) 04/18/2015     Microbiology: Recent Results (from the past 240 hour(s))  Blood Culture (routine x 2)     Status: Abnormal   Collection Time: 08/30/16  6:48 PM  Result Value Ref Range Status   Specimen Description RIGHT ANTECUBITAL  Final   Special Requests BOTTLES DRAWN AEROBIC AND ANAEROBIC 5CC  Final   Culture  Setup Time   Final    GRAM POSITIVE COCCI IN CLUSTERS IN BOTH AEROBIC AND ANAEROBIC BOTTLES CRITICAL RESULT CALLED TO, READ BACK BY AND VERIFIED WITH: N. Batchelder Pharm.D. 11:35 08/31/16  ([email or web address])    Culture METHICILLIN RESISTANT STAPHYLOCOCCUS AUREUS (A)  Final   Report Status 09/02/2016 FINAL  Final   Organism ID, Bacteria METHICILLIN RESISTANT STAPHYLOCOCCUS AUREUS  Final      Susceptibility   Methicillin resistant staphylococcus aureus - MIC*    CIPROFLOXACIN <=0.5 SENSITIVE Sensitive     ERYTHROMYCIN >=8 RESISTANT Resistant     GENTAMICIN <=0.5 SENSITIVE Sensitive     OXACILLIN >=4 RESISTANT Resistant     TETRACYCLINE  <=1 SENSITIVE Sensitive     VANCOMYCIN <=0.5 SENSITIVE Sensitive     TRIMETH/SULFA <=10 SENSITIVE Sensitive     CLINDAMYCIN <=0.25 SENSITIVE Sensitive     RIFAMPIN <=0.5 SENSITIVE Sensitive     Inducible Clindamycin NEGATIVE Sensitive     * METHICILLIN RESISTANT STAPHYLOCOCCUS AUREUS  Blood Culture ID Panel (Reflexed)     Status: Abnormal   Collection Time: 08/30/16  6:48 PM  Result Value Ref Range Status   Enterococcus species NOT DETECTED NOT DETECTED Final   Listeria monocytogenes NOT DETECTED NOT DETECTED Final   Staphylococcus species DETECTED (A) NOT DETECTED Final    Comment: CRITICAL RESULT CALLED TO, READ BACK BY AND VERIFIED WITH: N. Batchelder Pharm.D. 11:35 08/31/16 (wilsonm)    Staphylococcus aureus DETECTED (A) NOT DETECTED Final    Comment: CRITICAL RESULT CALLED TO, READ BACK BY AND VERIFIED WITH: N. Batchelder Pharm.D. 11:35 08/31/16 (wilsonm)    Methicillin resistance DETECTED (A) NOT DETECTED Final    Comment: CRITICAL RESULT CALLED TO, READ BACK BY AND VERIFIED WITH: N. Batchelder Pharm.D. 11:35 08/31/16 (wilsonm)    Streptococcus species NOT DETECTED NOT DETECTED Final   Streptococcus agalactiae NOT DETECTED NOT DETECTED Final   Streptococcus pneumoniae NOT DETECTED NOT DETECTED Final   Streptococcus pyogenes NOT DETECTED NOT DETECTED Final   Acinetobacter baumannii NOT DETECTED  NOT DETECTED Final   Enterobacteriaceae species NOT DETECTED NOT DETECTED Final   Enterobacter cloacae complex NOT DETECTED NOT DETECTED Final   Escherichia coli NOT DETECTED NOT DETECTED Final   Klebsiella oxytoca NOT DETECTED NOT DETECTED Final   Klebsiella pneumoniae NOT DETECTED NOT DETECTED Final   Proteus species NOT DETECTED NOT DETECTED Final   Serratia marcescens NOT DETECTED NOT DETECTED Final   Haemophilus influenzae NOT DETECTED NOT DETECTED Final   Neisseria meningitidis NOT DETECTED NOT DETECTED Final   Pseudomonas aeruginosa NOT DETECTED NOT DETECTED Final   Candida  albicans NOT DETECTED NOT DETECTED Final   Candida glabrata NOT DETECTED NOT DETECTED Final   Candida krusei NOT DETECTED NOT DETECTED Final   Candida parapsilosis NOT DETECTED NOT DETECTED Final   Candida tropicalis NOT DETECTED NOT DETECTED Final  Blood Culture (routine x 2)     Status: Abnormal   Collection Time: 08/30/16  6:59 PM  Result Value Ref Range Status   Specimen Description LEFT ANTECUBITAL  Final   Special Requests BOTTLES DRAWN AEROBIC AND ANAEROBIC  5CC  Final   Culture  Setup Time   Final    GRAM POSITIVE COCCI IN CLUSTERS IN BOTH AEROBIC AND ANAEROBIC BOTTLES CRITICAL VALUE NOTED.  VALUE IS CONSISTENT WITH PREVIOUSLY REPORTED AND CALLED VALUE.    Culture (A)  Final    STAPHYLOCOCCUS AUREUS SUSCEPTIBILITIES PERFORMED ON PREVIOUS CULTURE WITHIN THE LAST 5 DAYS.    Report Status 09/02/2016 FINAL  Final  Blood Culture ID Panel (Reflexed)     Status: Abnormal   Collection Time: 08/30/16  6:59 PM  Result Value Ref Range Status   Enterococcus species NOT DETECTED NOT DETECTED Final   Listeria monocytogenes NOT DETECTED NOT DETECTED Final   Staphylococcus species DETECTED (A) NOT DETECTED Final    Comment: CRITICAL RESULT CALLED TO, READ BACK BY AND VERIFIED WITH: J. [facility] 08/31/16 AT 2211 BY J. FUDESCO    Staphylococcus aureus DETECTED (A) NOT DETECTED Final    Comment: CRITICAL RESULT CALLED TO, READ BACK BY AND VERIFIED WITH: J. [facility] 08/31/16 AT 2211 BY J. FUDESCO    Methicillin resistance DETECTED (A) NOT DETECTED Final    Comment: CRITICAL RESULT CALLED TO, READ BACK BY AND VERIFIED WITH: J.  PHARM 08/31/16 AT 2211 BY J. FUDESCO    Streptococcus species NOT DETECTED NOT DETECTED Final   Streptococcus agalactiae NOT DETECTED NOT DETECTED Final   Streptococcus pneumoniae NOT DETECTED NOT DETECTED Final   Streptococcus pyogenes NOT DETECTED NOT DETECTED Final   Acinetobacter baumannii NOT DETECTED NOT DETECTED Final   Enterobacteriaceae species  NOT DETECTED NOT DETECTED Final   Enterobacter cloacae complex NOT DETECTED NOT DETECTED Final   Escherichia coli NOT DETECTED NOT DETECTED Final   Klebsiella oxytoca NOT DETECTED NOT DETECTED Final   Klebsiella pneumoniae NOT DETECTED NOT DETECTED Final   Proteus species NOT DETECTED NOT DETECTED Final   Serratia marcescens NOT DETECTED NOT DETECTED Final   Haemophilus influenzae NOT DETECTED NOT DETECTED Final   Neisseria meningitidis NOT DETECTED NOT DETECTED Final   Pseudomonas aeruginosa NOT DETECTED NOT DETECTED Final   Candida albicans NOT DETECTED NOT DETECTED Final   Candida glabrata NOT DETECTED NOT DETECTED Final   Candida krusei NOT DETECTED NOT DETECTED Final   Candida parapsilosis NOT DETECTED NOT DETECTED Final   Candida tropicalis NOT DETECTED NOT DETECTED Final  Urine culture     Status: None   Collection Time: 08/30/16  7:26 PM  Result Value Ref  Range Status   Specimen Description URINE, RANDOM  Final   Special Requests NONE  Final   Culture NO GROWTH  Final   Report Status 09/01/2016 FINAL  Final  Culture, blood (routine x 2)     Status: Abnormal   Collection Time: 09/01/16  3:22 AM  Result Value Ref Range Status   Specimen Description BLOOD LEFT ARM  Final   Special Requests IN PEDIATRIC BOTTLE 2CC  Final   Culture  Setup Time   Final    GRAM POSITIVE COCCI IN CLUSTERS IN PEDIATRIC BOTTLE CRITICAL RESULT CALLED TO, READ BACK BY AND VERIFIED WITH: TO VBYRK(PHARD) BY TCLEVELAND 09/02/2016 AT 00:44 AM    Culture (A)  Final    STAPHYLOCOCCUS AUREUS SUSCEPTIBILITIES PERFORMED ON PREVIOUS CULTURE WITHIN THE LAST 5 DAYS.    Report Status 09/02/2016 FINAL  Final  Blood Culture ID Panel (Reflexed)     Status: Abnormal   Collection Time: 09/01/16  3:22 AM  Result Value Ref Range Status   Enterococcus species NOT DETECTED NOT DETECTED Final   Listeria monocytogenes NOT DETECTED NOT DETECTED Final   Staphylococcus species DETECTED (A) NOT DETECTED Final    Comment:  CRITICAL RESULT CALLED TO, READ BACK BY AND VERIFIED WITH: TO VBYRK(PHARMD) BY TCLEVELAND 09/02/2016 AT 12:44AM    Staphylococcus aureus DETECTED (A) NOT DETECTED Final    Comment: CRITICAL RESULT CALLED TO, READ BACK BY AND VERIFIED WITH: TO VBYRK(PHARMD) BY TCLEVELAND 09/02/2016 AT 12:44AM    Methicillin resistance DETECTED (A) NOT DETECTED Final    Comment: CRITICAL RESULT CALLED TO, READ BACK BY AND VERIFIED WITH: TO VBYRK(PHARMD) BY TCLEVELAND 09/02/2016 AT 12:44AM    Streptococcus species NOT DETECTED NOT DETECTED Final   Streptococcus agalactiae NOT DETECTED NOT DETECTED Final   Streptococcus pneumoniae NOT DETECTED NOT DETECTED Final   Streptococcus pyogenes NOT DETECTED NOT DETECTED Final   Acinetobacter baumannii NOT DETECTED NOT DETECTED Final   Enterobacteriaceae species NOT DETECTED NOT DETECTED Final   Enterobacter cloacae complex NOT DETECTED NOT DETECTED Final   Escherichia coli NOT DETECTED NOT DETECTED Final   Klebsiella oxytoca NOT DETECTED NOT DETECTED Final   Klebsiella pneumoniae NOT DETECTED NOT DETECTED Final   Proteus species NOT DETECTED NOT DETECTED Final   Serratia marcescens NOT DETECTED NOT DETECTED Final   Haemophilus influenzae NOT DETECTED NOT DETECTED Final   Neisseria meningitidis NOT DETECTED NOT DETECTED Final   Pseudomonas aeruginosa NOT DETECTED NOT DETECTED Final   Candida albicans NOT DETECTED NOT DETECTED Final   Candida glabrata NOT DETECTED NOT DETECTED Final   Candida krusei NOT DETECTED NOT DETECTED Final   Candida parapsilosis NOT DETECTED NOT DETECTED Final   Candida tropicalis NOT DETECTED NOT DETECTED Final  Culture, blood (routine x 2)     Status: None (Preliminary result)   Collection Time: 09/01/16  6:24 AM  Result Value Ref Range Status   Specimen Description BLOOD BLOOD RIGHT HAND  Final   Special Requests BOTTLES DRAWN AEROBIC AND ANAEROBIC 5CC  Final   Culture NO GROWTH < 12 HOURS  Final   Report Status PENDING  Incomplete     Impression/Plan:  1. Staph aureus bacteremia - now 2/2.  Repeat cultures sent.  TTE with possible mitral valve vegetation.  Since no TEE, treat for 6 weeks through February 26th with vancomycin per home health protocol   2. Olecranon bursitis - no fluid.  On antibiotics as above for #1.

## 2016-09-02 NOTE — Progress Notes (Signed)
Received call from endo regarding pt scheduling for TEE this morning, stated pt had not been NPO received ensure at 0500 and verified with night shift RN Pt not alert and oriented to sign consent. She stated she would call and inform the Md, procedure canceled for now  Philicia Heyne Leory Plowman

## 2016-09-02 NOTE — Progress Notes (Signed)
PHARMACY - PHYSICIAN COMMUNICATION CRITICAL VALUE ALERT - BLOOD CULTURE IDENTIFICATION (BCID)  Results for orders placed or performed during the hospital encounter of 08/30/16  Blood Culture ID Panel (Reflexed) (Collected: 09/01/2016  3:22 AM)  Result Value Ref Range   Enterococcus species NOT DETECTED NOT DETECTED   Listeria monocytogenes NOT DETECTED NOT DETECTED   Staphylococcus species DETECTED (A) NOT DETECTED   Staphylococcus aureus DETECTED (A) NOT DETECTED   Methicillin resistance DETECTED (A) NOT DETECTED   Streptococcus species NOT DETECTED NOT DETECTED   Streptococcus agalactiae NOT DETECTED NOT DETECTED   Streptococcus pneumoniae NOT DETECTED NOT DETECTED   Streptococcus pyogenes NOT DETECTED NOT DETECTED   Acinetobacter baumannii NOT DETECTED NOT DETECTED   Enterobacteriaceae species NOT DETECTED NOT DETECTED   Enterobacter cloacae complex NOT DETECTED NOT DETECTED   Escherichia coli NOT DETECTED NOT DETECTED   Klebsiella oxytoca NOT DETECTED NOT DETECTED   Klebsiella pneumoniae NOT DETECTED NOT DETECTED   Proteus species NOT DETECTED NOT DETECTED   Serratia marcescens NOT DETECTED NOT DETECTED   Haemophilus influenzae NOT DETECTED NOT DETECTED   Neisseria meningitidis NOT DETECTED NOT DETECTED   Pseudomonas aeruginosa NOT DETECTED NOT DETECTED   Candida albicans NOT DETECTED NOT DETECTED   Candida glabrata NOT DETECTED NOT DETECTED   Candida krusei NOT DETECTED NOT DETECTED   Candida parapsilosis NOT DETECTED NOT DETECTED   Candida tropicalis NOT DETECTED NOT DETECTED    Name of physician (or Provider) Contacted: Tylene Fantasia  Changes to prescribed antibiotics required: Prior BCID also MRSA, already covered, no changes necessary.  Wynona Neat, PharmD, BCPS  09/02/2016  12:47 AM

## 2016-09-02 NOTE — Progress Notes (Addendum)
Pharmacy Antibiotic Note Andrew [name] is a 71 y.o. 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  Pt has refused TEE so planning to treat with IV vancomycin for 6 weeks with anticipated stop date of 10/12/16 per ID.  Current dose of vancomycin dose is 750 mg IV every 12 hours.   Vancomycin trough drawn this evening resulted as 10 which is below desired range of 15-20 based on indication.   Plan: 1. Adjust vancomycin to 1250 mg IV every 12 hours 2. Obtain vancomycin trough at SS; goal trough 15-20 3. SCr every 72 hours while inpatient    Height: 5\' 8"  (172.7 cm) Weight: 145 lb 1 oz (65.8 kg) IBW/kg (Calculated) : 68.4  Temp (24hrs), Avg:98.2 F (36.8 C), Min:98.2 F (36.8 C), Max:98.2 F (36.8 C)   Recent Labs Lab 08/30/16 1754 08/30/16 1804 08/30/16 2106 08/31/16 0006 09/01/16 0322 09/02/16 0514 09/02/16 1913  WBC 24.8*  --   --   --  17.0* 14.2*  --   CREATININE 1.28*  --   --   --  0.50* 0.53*  --   LATICACIDVEN  --  4.85* 1.5 1.2  --   --   --   VANCOTROUGH  --   --   --   --   --   --  10*    Estimated Creatinine Clearance: 78.8 mL/min (by C-G formula based on SCr of 0.53 mg/dL (L)).    Allergies  Allergen Reactions  . Penicillins Rash and Other (See Comments)    Possibly rash? Has patient had a PCN reaction causing immediate rash, facial/tongue/throat swelling, SOB or lightheadedness with hypotension: YES Has patient had a PCN reaction causing severe rash involving mucus membranes or skin necrosis:NO Has patient had a PCN reaction that required hospitalization NO Has patient had a PCN reaction occurring within the last 10 years:NO If all of the above answers are "NO", then may proceed with Cephalosporin use.    Antimicrobials this admission:  1/14 vancomycin  >>  Zosyn 1/14 >> 1/15  Dose adjustments this admission: n/a  Microbiology results: 1/14 BCx x2 - Staph aureus (BCID MRSA) 1/14 UCx - negative 1/16 BCx x2 -  GPC (BCID MRSA) Thank you for allowing pharmacy to be a part of this patient's care.  Vincenza Hews, PharmD, BCPS 09/02/2016, 8:20 PM Pager: [phone]

## 2016-09-02 NOTE — Evaluation (Signed)
Clinical/Bedside Swallow Evaluation Patient Details  Name: FLABIO VRADENBURG MRN: [id number] Date of Birth: [birth year]  Today's Date: 09/02/2016 Time: SLP Start Time (ACUTE ONLY): [id number] SLP Stop Time (ACUTE ONLY): 0856 SLP Time Calculation (min) (ACUTE ONLY): 19 min  Past Medical History:  Past Medical History:  Diagnosis Date  . Blind right eye   . Diabetes mellitus without complication ([place])   . Hyperlipemia   . Hypertension   . 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 No pertinent surgical history. HPI:  Ptis a 71 y.o.malewith PMH significant of hypertension, hyperlipidemia, diabetes mellitus, anxiety, AAA premises 4.8 cm on MRI (04/09/15), right eye blindness, dCHF, who presents with chest pain, SOB and right elbow pain with redness and swelling. Pt states he has been having chest pain, SOB, and generalized weakness for 2 days. Chest pain in substernal area and is associated with SOB. No CT evidence for acute pulmonary embolus, no acute infiltrates, mild emphysematous disease, 3 mm right upper lobe pulmonary nodule, and left adrenal gland appears slightly enlarged with possible nodules.   Assessment / Plan / Recommendation Clinical Impression  Pt internally distracted by pain, required max cues and encouragement during assessment. Prior to bedside swallow eval, MD placed pt on soft diet with thin liquids. SLP assessed oral motor functioning and all aspects appeared to be within normal limits, however missing denition was noted resulting in prolonged mastication. Pt accepted thin liquids via cup and straw as well as a solid cracker. No overt signs/symptoms of aspiration at bedside. SLP educated pt re: diet recommendations; upgrade diet from soft to regular textures and continue with thin liquids. No further ST needed.     Aspiration Risk  No limitations    Diet Recommendation Regular;Thin liquid   Liquid  Administration via: Straw;Cup Medication Administration: Whole meds with liquid Supervision: Staff to assist with self feeding;Full supervision/cueing for compensatory strategies Compensations: Small sips/bites;Slow rate Postural Changes: Seated upright at 90 degrees    Other  Recommendations Oral Care Recommendations: Oral care BID   Follow up Recommendations None      Frequency and Duration            Prognosis Prognosis for Safe Diet Advancement: Good      Swallow Study   General HPI: Ptis a 71 y.o.malewith PMH significant of hypertension, hyperlipidemia, diabetes mellitus, anxiety, AAA premises 4.8 cm on MRI (04/09/15), right eye blindness, dCHF, who presents with chest pain, SOB and right elbow pain with redness and swelling. Pt states he has been having chest pain, SOB, and generalized weakness for 2 days. Chest pain in substernal area and is associated with SOB. No CT evidence for acute pulmonary embolus, no acute infiltrates, mild emphysematous disease, 3 mm right upper lobe pulmonary nodule, and left adrenal gland appears slightly enlarged with possible nodules. Type of Study: Bedside Swallow Evaluation Previous Swallow Assessment: none Diet Prior to this Study: Other (Comment);Thin liquids (diet: soft) Temperature Spikes Noted: No Respiratory Status: Room air History of Recent Intubation: No Behavior/Cognition: Cooperative;Other (Comment);Requires cueing;Distractible (in pain) Oral Cavity Assessment: Dry Oral Care Completed by SLP: No Oral Cavity - Dentition: Missing dentition;Poor condition Vision: Impaired for self-feeding Self-Feeding Abilities: Needs assist;Needs set up Patient Positioning: Upright in bed Baseline Vocal Quality: Low vocal intensity;Hoarse Volitional Cough: Weak Volitional Swallow: Unable to elicit    Oral/Motor/Sensory Function Overall Oral Motor/Sensory Function: Within functional limits   Ice Chips Ice chips: Not tested   Thin Liquid Thin  Liquid: Within functional limits Presentation: Cup;Straw    Nectar Thick Nectar Thick Liquid: Not tested   Honey Thick Honey Thick Liquid: Not tested   Puree Puree: Not tested   Solid   GO   Solid: Within functional limits Presentation: [place], Student SLP 09/02/2016,10:12 AM

## 2016-09-02 NOTE — Progress Notes (Signed)
[facility] is following for home infusion pharmacy support for long term IV ABX as per [facility] team recommendation. [facility] will partner with patient's [facility] agency of choice at DC to home.  If patient discharges after hours, please call [phone].   Larry Sierras 09/02/2016, 11:30 PM

## 2016-09-02 NOTE — Progress Notes (Signed)
Paged MD regarding pt critical CBG of [phone] Awaiting call back Will get blood draw for verification  Andrew [name] Leory Plowman

## 2016-09-03 ENCOUNTER — Encounter (HOSPITAL_COMMUNITY)
Admission: EM | Disposition: A | Discharge status: Home Health | Payer: Self-pay | Source: Home / Self Care | Attending: Internal Medicine

## 2016-09-03 LAB — BASIC METABOLIC PANEL
ANION GAP: 10 (ref 5–15)
BUN: 12 mg/dL (ref 6–20)
CALCIUM: 8.4 mg/dL — AB (ref 8.9–10.3)
CO2: 25 mmol/L (ref 22–32)
Chloride: 94 mmol/L — ABNORMAL LOW (ref 101–111)
Creatinine, Ser: 0.43 mg/dL — ABNORMAL LOW (ref 0.61–1.24)
GFR calc non Af Amer: >60 mL/min (ref >60)
Glucose, Bld: 112 mg/dL — ABNORMAL HIGH (ref 65–99)
POTASSIUM: 3.2 mmol/L — AB (ref 3.5–5.1)
Sodium: 129 mmol/L — ABNORMAL LOW (ref 135–145)

## 2016-09-03 LAB — CBC
HCT: 40 % (ref 39.0–52.0)
HEMOGLOBIN: 14 g/dL (ref 13.0–17.0)
MCH: 28.7 pg (ref 26.0–34.0)
MCHC: 35 g/dL (ref 30.0–36.0)
MCV: 82.1 fL (ref 78.0–100.0)
Platelets: 244 10*3/uL (ref 150–400)
RBC: 4.87 MIL/uL (ref 4.22–5.81)
RDW: 14.2 % (ref 11.5–15.5)
WBC: 15.2 10*3/uL — ABNORMAL HIGH (ref 4.0–10.5)

## 2016-09-03 LAB — GLUCOSE, CAPILLARY
GLUCOSE-CAPILLARY: 139 mg/dL — AB (ref 65–99)
GLUCOSE-CAPILLARY: 172 mg/dL — AB (ref 65–99)
Glucose-Capillary: 126 mg/dL — ABNORMAL HIGH (ref 65–99)
Glucose-Capillary: 163 mg/dL — ABNORMAL HIGH (ref 65–99)
Glucose-Capillary: 201 mg/dL — ABNORMAL HIGH (ref 65–99)

## 2016-09-03 LAB — MAGNESIUM: MAGNESIUM: 1.6 mg/dL — AB (ref 1.7–2.4)

## 2016-09-03 SURGERY — ECHOCARDIOGRAM, TRANSESOPHAGEAL
Anesthesia: Moderate Sedation

## 2016-09-03 MED ORDER — MAGNESIUM SULFATE 2 GM/50ML IV SOLN
2.0000 g | Freq: Once | INTRAVENOUS | Status: AC
  Administered 2016-09-03: 2 g via INTRAVENOUS
  Filled 2016-09-03: qty 50

## 2016-09-03 MED ORDER — POTASSIUM CHLORIDE CRYS ER 20 MEQ PO TBCR
40.0000 meq | EXTENDED_RELEASE_TABLET | Freq: Once | ORAL | Status: AC
  Administered 2016-09-03: 40 meq via ORAL
  Filled 2016-09-03: qty 2

## 2016-09-03 MED ORDER — INSULIN GLARGINE 100 UNIT/ML ~~LOC~~ SOLN
20.0000 [IU] | Freq: Every day | SUBCUTANEOUS | Status: DC
  Administered 2016-09-03 – 2016-09-06 (×4): 20 [IU] via SUBCUTANEOUS
  Filled 2016-09-03 (×4): qty 0.2

## 2016-09-03 NOTE — Progress Notes (Signed)
[facility]   Reason for visit: Follow up on Staph aureus bacteremia  Interval History: refuses TEE; afebrile; repeat blood culture now 1/2 positive again  Physical Exam: Constitutional:  Vitals:   09/03/16 0922 09/03/16 1220  BP: (!) 140/57 (!) 142/60  Pulse:  82  Resp:  18  Temp:  98 F (36.7 C)   patient appears in NAD  Review of Systems: Constitutional: no fever, no chills  Lab Results  Component Value Date   WBC 15.2 (H) 09/03/2016   HGB 14.0 09/03/2016   HCT 40.0 09/03/2016   MCV 82.1 09/03/2016   PLT 244 09/03/2016    Lab Results  Component Value Date   CREATININE 0.43 (L) 09/03/2016   BUN 12 09/03/2016   NA 129 (L) 09/03/2016   K 3.2 (L) 09/03/2016   CL 94 (L) 09/03/2016   CO2 25 09/03/2016    Lab Results  Component Value Date   ALT 33 04/18/2015   AST 28 04/18/2015   ALKPHOS 183 (H) 04/18/2015     Microbiology: Recent Results (from the past 240 hour(s))  Blood Culture (routine x 2)     Status: Abnormal   Collection Time: 08/30/16  6:48 PM  Result Value Ref Range Status   Specimen Description RIGHT ANTECUBITAL  Final   Special Requests BOTTLES DRAWN AEROBIC AND ANAEROBIC 5CC  Final   Culture  Setup Time   Final    GRAM POSITIVE COCCI IN CLUSTERS IN BOTH AEROBIC AND ANAEROBIC BOTTLES CRITICAL RESULT CALLED TO, READ BACK BY AND VERIFIED WITH: N. Batchelder Pharm.D. 11:35 08/31/16  ([email or web address])    Culture METHICILLIN RESISTANT STAPHYLOCOCCUS AUREUS (A)  Final   Report Status 09/02/2016 FINAL  Final   Organism ID, Bacteria METHICILLIN RESISTANT STAPHYLOCOCCUS AUREUS  Final      Susceptibility   Methicillin resistant staphylococcus aureus - MIC*    CIPROFLOXACIN <=0.5 SENSITIVE Sensitive     ERYTHROMYCIN >=8 RESISTANT Resistant     GENTAMICIN <=0.5 SENSITIVE Sensitive     OXACILLIN >=4 RESISTANT Resistant     TETRACYCLINE <=1 SENSITIVE Sensitive     VANCOMYCIN <=0.5 SENSITIVE Sensitive     TRIMETH/SULFA <=10 SENSITIVE  Sensitive     CLINDAMYCIN <=0.25 SENSITIVE Sensitive     RIFAMPIN <=0.5 SENSITIVE Sensitive     Inducible Clindamycin NEGATIVE Sensitive     * METHICILLIN RESISTANT STAPHYLOCOCCUS AUREUS  Blood Culture ID Panel (Reflexed)     Status: Abnormal   Collection Time: 08/30/16  6:48 PM  Result Value Ref Range Status   Enterococcus species NOT DETECTED NOT DETECTED Final   Listeria monocytogenes NOT DETECTED NOT DETECTED Final   Staphylococcus species DETECTED (A) NOT DETECTED Final    Comment: CRITICAL RESULT CALLED TO, READ BACK BY AND VERIFIED WITH: N. Batchelder Pharm.D. 11:35 08/31/16 (wilsonm)    Staphylococcus aureus DETECTED (A) NOT DETECTED Final    Comment: CRITICAL RESULT CALLED TO, READ BACK BY AND VERIFIED WITH: N. Batchelder Pharm.D. 11:35 08/31/16 (wilsonm)    Methicillin resistance DETECTED (A) NOT DETECTED Final    Comment: CRITICAL RESULT CALLED TO, READ BACK BY AND VERIFIED WITH: N. Batchelder Pharm.D. 11:35 08/31/16 (wilsonm)    Streptococcus species NOT DETECTED NOT DETECTED Final   Streptococcus agalactiae NOT DETECTED NOT DETECTED Final   Streptococcus pneumoniae NOT DETECTED NOT DETECTED Final   Streptococcus pyogenes NOT DETECTED NOT DETECTED Final   Acinetobacter baumannii NOT DETECTED NOT DETECTED Final   Enterobacteriaceae species NOT DETECTED NOT DETECTED Final  Enterobacter cloacae complex NOT DETECTED NOT DETECTED Final   Escherichia coli NOT DETECTED NOT DETECTED Final   Klebsiella oxytoca NOT DETECTED NOT DETECTED Final   Klebsiella pneumoniae NOT DETECTED NOT DETECTED Final   Proteus species NOT DETECTED NOT DETECTED Final   Serratia marcescens NOT DETECTED NOT DETECTED Final   Haemophilus influenzae NOT DETECTED NOT DETECTED Final   Neisseria meningitidis NOT DETECTED NOT DETECTED Final   Pseudomonas aeruginosa NOT DETECTED NOT DETECTED Final   Candida albicans NOT DETECTED NOT DETECTED Final   Candida glabrata NOT DETECTED NOT DETECTED Final   Candida  krusei NOT DETECTED NOT DETECTED Final   Candida parapsilosis NOT DETECTED NOT DETECTED Final   Candida tropicalis NOT DETECTED NOT DETECTED Final  Blood Culture (routine x 2)     Status: Abnormal   Collection Time: 08/30/16  6:59 PM  Result Value Ref Range Status   Specimen Description LEFT ANTECUBITAL  Final   Special Requests BOTTLES DRAWN AEROBIC AND ANAEROBIC  5CC  Final   Culture  Setup Time   Final    GRAM POSITIVE COCCI IN CLUSTERS IN BOTH AEROBIC AND ANAEROBIC BOTTLES CRITICAL VALUE NOTED.  VALUE IS CONSISTENT WITH PREVIOUSLY REPORTED AND CALLED VALUE.    Culture (A)  Final    STAPHYLOCOCCUS AUREUS SUSCEPTIBILITIES PERFORMED ON PREVIOUS CULTURE WITHIN THE LAST 5 DAYS.    Report Status 09/02/2016 FINAL  Final  Blood Culture ID Panel (Reflexed)     Status: Abnormal   Collection Time: 08/30/16  6:59 PM  Result Value Ref Range Status   Enterococcus species NOT DETECTED NOT DETECTED Final   Listeria monocytogenes NOT DETECTED NOT DETECTED Final   Staphylococcus species DETECTED (A) NOT DETECTED Final    Comment: CRITICAL RESULT CALLED TO, READ BACK BY AND VERIFIED WITH: J. [place] [facility] 08/31/16 AT 2211 BY J. FUDESCO    Staphylococcus aureus DETECTED (A) NOT DETECTED Final    Comment: CRITICAL RESULT CALLED TO, READ BACK BY AND VERIFIED WITH: J. [place] [facility] 08/31/16 AT 2211 BY J. FUDESCO    Methicillin resistance DETECTED (A) NOT DETECTED Final    Comment: CRITICAL RESULT CALLED TO, READ BACK BY AND VERIFIED WITH: J.  [facility] 08/31/16 AT 2211 BY J. FUDESCO    Streptococcus species NOT DETECTED NOT DETECTED Final   Streptococcus agalactiae NOT DETECTED NOT DETECTED Final   Streptococcus pneumoniae NOT DETECTED NOT DETECTED Final   Streptococcus pyogenes NOT DETECTED NOT DETECTED Final   Acinetobacter baumannii NOT DETECTED NOT DETECTED Final   Enterobacteriaceae species NOT DETECTED NOT DETECTED Final   Enterobacter cloacae complex NOT DETECTED NOT DETECTED Final    Escherichia coli NOT DETECTED NOT DETECTED Final   Klebsiella oxytoca NOT DETECTED NOT DETECTED Final   Klebsiella pneumoniae NOT DETECTED NOT DETECTED Final   Proteus species NOT DETECTED NOT DETECTED Final   Serratia marcescens NOT DETECTED NOT DETECTED Final   Haemophilus influenzae NOT DETECTED NOT DETECTED Final   Neisseria meningitidis NOT DETECTED NOT DETECTED Final   Pseudomonas aeruginosa NOT DETECTED NOT DETECTED Final   Candida albicans NOT DETECTED NOT DETECTED Final   Candida glabrata NOT DETECTED NOT DETECTED Final   Candida krusei NOT DETECTED NOT DETECTED Final   Candida parapsilosis NOT DETECTED NOT DETECTED Final   Candida tropicalis NOT DETECTED NOT DETECTED Final  Urine culture     Status: None   Collection Time: 08/30/16  7:26 PM  Result Value Ref Range Status   Specimen Description URINE, RANDOM  Final   Special Requests  NONE  Final   Culture NO GROWTH  Final   Report Status 09/01/2016 FINAL  Final  Culture, blood (routine x 2)     Status: Abnormal   Collection Time: 09/01/16  3:22 AM  Result Value Ref Range Status   Specimen Description BLOOD LEFT ARM  Final   Special Requests IN PEDIATRIC BOTTLE 2CC  Final   Culture  Setup Time   Final    GRAM POSITIVE COCCI IN CLUSTERS IN PEDIATRIC BOTTLE CRITICAL RESULT CALLED TO, READ BACK BY AND VERIFIED WITH: TO VBYRK(PHARD) BY TC[place]AND 09/02/2016 AT 00:44 AM    Culture (A)  Final    STAPHYLOCOCCUS AUREUS SUSCEPTIBILITIES PERFORMED ON PREVIOUS CULTURE WITHIN THE LAST 5 DAYS.    Report Status 09/02/2016 FINAL  Final  Blood Culture ID Panel (Reflexed)     Status: Abnormal   Collection Time: 09/01/16  3:22 AM  Result Value Ref Range Status   Enterococcus species NOT DETECTED NOT DETECTED Final   Listeria monocytogenes NOT DETECTED NOT DETECTED Final   Staphylococcus species DETECTED (A) NOT DETECTED Final    Comment: CRITICAL RESULT CALLED TO, READ BACK BY AND VERIFIED WITH: TO VBYRK(PHARMD) BY TC[place]AND 09/02/2016  AT 12:44AM    Staphylococcus aureus DETECTED (A) NOT DETECTED Final    Comment: CRITICAL RESULT CALLED TO, READ BACK BY AND VERIFIED WITH: TO VBYRK(PHARMD) BY TC[place]AND 09/02/2016 AT 12:44AM    Methicillin resistance DETECTED (A) NOT DETECTED Final    Comment: CRITICAL RESULT CALLED TO, READ BACK BY AND VERIFIED WITH: TO VBYRK(PHARMD) BY TC[place]AND 09/02/2016 AT 12:44AM    Streptococcus species NOT DETECTED NOT DETECTED Final   Streptococcus agalactiae NOT DETECTED NOT DETECTED Final   Streptococcus pneumoniae NOT DETECTED NOT DETECTED Final   Streptococcus pyogenes NOT DETECTED NOT DETECTED Final   Acinetobacter baumannii NOT DETECTED NOT DETECTED Final   Enterobacteriaceae species NOT DETECTED NOT DETECTED Final   Enterobacter cloacae complex NOT DETECTED NOT DETECTED Final   Escherichia coli NOT DETECTED NOT DETECTED Final   Klebsiella oxytoca NOT DETECTED NOT DETECTED Final   Klebsiella pneumoniae NOT DETECTED NOT DETECTED Final   Proteus species NOT DETECTED NOT DETECTED Final   Serratia marcescens NOT DETECTED NOT DETECTED Final   Haemophilus influenzae NOT DETECTED NOT DETECTED Final   Neisseria meningitidis NOT DETECTED NOT DETECTED Final   Pseudomonas aeruginosa NOT DETECTED NOT DETECTED Final   Candida albicans NOT DETECTED NOT DETECTED Final   Candida glabrata NOT DETECTED NOT DETECTED Final   Candida krusei NOT DETECTED NOT DETECTED Final   Candida parapsilosis NOT DETECTED NOT DETECTED Final   Candida tropicalis NOT DETECTED NOT DETECTED Final  Culture, blood (routine x 2)     Status: None (Preliminary result)   Collection Time: 09/01/16  6:24 AM  Result Value Ref Range Status   Specimen Description BLOOD BLOOD RIGHT HAND  Final   Special Requests BOTTLES DRAWN AEROBIC AND ANAEROBIC 5CC  Final   Culture NO GROWTH 2 DAYS  Final   Report Status PENDING  Incomplete    Impression/Plan:  1. Staph aureus bacteremia - now 2/2.  Repeat cultures again positive.  TTE with  possible mitral valve vegetation.  Since no TEE, treat for 6 weeks through February 28th with vancomycin per home health protocol/SNF picc line can be pulled at the end of treatment I will initiate OPAT order   2. Olecranon bursitis - no fluid.  On antibiotics as above for #1.

## 2016-09-03 NOTE — Progress Notes (Addendum)
Patient ID: Andrew [name], male   DOB: Dec 04, 1945, 71 y.o.   MRN: [id number]    PROGRESS NOTE    Andrew [name]  MOQ:[id number] DOB: January 19, 1946 DOA: 08/30/2016  PCP: Woody Seller, MD   Brief Narrative:  71 y.o. male with hypertension, hyperlipidemia, diabetes mellitus, anxiety, AAA premises 4.8 cm on MRI 04/09/15), right eye blindness, dCHF, who presented with two days duration of substernal and pressure like chest pain, SOB and right elbow pain.  ED Course: pt was found to have WBC 24.8, lactate of 4.85, troponin 0.07, acute renal injury with Cr 1.28, hyponatremia with sodium 129. Chest x-ray showed patchy atelectasis in the left base without infiltration. D-dimer positive, but CT angiograms negative for PE or infiltration.   # CTA of chest: 1. No CT evidence for acute pulmonary embolus. 2. No acute infiltrates. Mild emphysematous disease. 3. 3 mm right upper lobe pulmonary nodule.  4. Left adrenal gland enlarged with ? nodules. Follow-up CT with adrenal protocol for further evaluation.  Assessment & Plan:  Chest pain and positive trop - CT angiogram is negative for PE and infiltration - possible demands ischemia in the setting of sepsis but pt with underlying risk factors so will need monitoring  - initial troponin 0.07 --> 0.21 this AM - cardio following, planned for TEE but pt has declined  - no need to cycle trop's further  - cardiology signed off today - pt with no CP this AM   Sepsis secondary to R olecranon bursitis, MRSA bacteremia  - pt met criteria for sepsis with elevated lactate, leukocytosis, tachycardia and tachypnea.  - continue vanc day #5, stop date February 26th, 2018 per ID team recommendations  - appreciate ID team assistance, ortho consulted and attempted aspirating but only trace blood obtained, no pocket of fluid observed  - appreciate Dr. 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  - increased the dose of Lantus to 20 U, along with SSI  Chronic diastolic CHF (congestive heart failure) - 2-D echo 04/09/15 showed EF of 65-70% with grade 1 diastolic dysfunction - new ECHO requested on this admission - monitor volume status closely  - Continue metoprolol and aspirin - strict I/O, daily weights  - weight trend since admission: 148 --> 145 --> 148 lbs  Acute metabolic encephalopathy - from sepsis  - more alert this AM - advanced diet and pt tolerating well  - PT eval adone, SNF recommended, SW consulted for assistance   Hypokalemia - Mg also low, will continue to supplement  - repeat BMP and Mg in AM  Pressure injury - 3 full thickness abrasions, left elbow; .3X.3cm, Left outer elbow .5X2.5X.1cm and .8X.5X.1cm - WOC recommendations is to use Bactroban to provide antimicrobial benefits and promote moist healing, foam dressing to protect from further injury - inner gluteal fold and sacrum is red and moist; appearance consistent with moisture associated skin damage. - recommend foam dressing to protect from further injury  Alcohol abuse - in remission  Essential hypertension:  - Amlodipine, metoprolol, - Hold lisinopril due to AKI - so far reasonable inpatient control   AKI, hyponatremia, metabolic acidosis  - Cr 3.54. Likely due to prerenal secondary to dehydration, sepsis - continue  to hold Lisinopril - Cr is now WNL, CO2 also WNL - BMP In AM  DVT prophylaxis: Lovenox SQ Code Status: Full  Family Communication: Patient at bedside, no family at bedside  Disposition Plan: to be determined, SNF likely in 1-2 days when electrolytes stable   Consultants:   Cardiology   ID  Ortho   Procedures:   None  Antimicrobials:   Vancomycin 1/14 -->  Zosyn 1/14 --> 1/15    Subjective: Still with right elbow pain but overall better.   Objective: Vitals:   09/02/16 1713 09/02/16 2118 09/03/16 0427 09/03/16 0922  BP: (!) 129/58 132/69 (!) 137/58 (!) 140/57  Pulse: 75 82 90   Resp: 15 18 18    Temp:  97.9 F (36.6 C) 98.5 F (36.9 C)   TempSrc:  Oral Oral   SpO2: 96% 98% 97%   Weight:   67.5 kg (148 lb 13 oz)   Height:        Intake/Output Summary (Last 24 hours) at 09/03/16 1126 Last data filed at 09/03/16 0936  Gross per 24 hour  Intake             1290 ml  Output             1225 ml  Net               65 ml   Filed Weights   09/01/16 0451 09/02/16 0455 09/03/16 0427  Weight: 65.6 kg (144 lb 10 oz) 65.8 kg (145 lb 1 oz) 67.5 kg (148 lb 13 oz)    Examination:  General exam: Appears more alert, oriented to name and DOB only  Respiratory system: diminished breath sounds at bases  Cardiovascular system: RRR. No JVD, murmurs, rubs, gallops or clicks. No pedal edema. Gastrointestinal system: Abdomen is nondistended, soft and nontender. No organomegaly or masses felt. Central nervous system: Right elbow with mild erythema and still TTP  Data Reviewed: I have personally reviewed following labs and imaging studies  CBC:  Recent Labs Lab 08/30/16 1754 09/01/16 0322 09/02/16 0514 09/03/16 0445  WBC 24.8* 17.0* 14.2* 15.2*  HGB 16.5 14.5 13.8 14.0  HCT 45.4 40.0 39.6 40.0  MCV 82.8 81.3 82.0 82.1  PLT 309 251 238 235   Basic Metabolic Panel:  Recent Labs Lab 08/30/16 1754 09/01/16 0322 09/01/16 0624 09/02/16 0514 09/03/16 0445  NA 129* 133*  --  131* 129*  K 4.2 2.4*  --  2.8* 3.2*  CL 92* 100*  --  98* 94*  CO2 17* 22  --  22 25  GLUCOSE 279* 82  --  166* 112*  BUN 29* 16  --  13 12  CREATININE 1.28* 0.50*  --  0.53* 0.43*  CALCIUM 9.9 8.7*  --  8.7* 8.4*  MG  --   --  1.6* 1.6* 1.6*   Coagulation Profile:  Recent Labs Lab 08/30/16 2216  INR 1.19   Cardiac Enzymes:  Recent Labs Lab 08/30/16 2216 08/31/16 0231  08/31/16 0608  TROPONINI 0.07* 0.07* 0.21*   CBG:  Recent Labs Lab 09/02/16 1141 09/02/16 1707 09/02/16 2116 09/03/16 0644 09/03/16 0650  GLUCAP 456* 177* 92 126* 139*   Lipid Profile: No results for input(s): CHOL, HDL, LDLCALC, TRIG, CHOLHDL, LDLDIRECT in the last 72 hours. Urine analysis:    Component Value Date/Time   COLORURINE YELLOW 08/30/2016 1926   APPEARANCEUR HAZY (A) 08/30/2016 1926   LABSPEC 1.020 08/30/2016 1926   PHURINE 5.0 08/30/2016 1926   GLUCOSEU >=  500 (A) 08/30/2016 1926   HGBUR MODERATE (A) 08/30/2016 1926   BILIRUBINUR NEGATIVE 08/30/2016 1926   KETONESUR 20 (A) 08/30/2016 1926   PROTEINUR 100 (A) 08/30/2016 1926   UROBILINOGEN 0.2 04/18/2015 0210   NITRITE NEGATIVE 08/30/2016 1926   LEUKOCYTESUR NEGATIVE 08/30/2016 1926   Recent Results (from the past 240 hour(s))  Blood Culture (routine x 2)     Status: Abnormal   Collection Time: 08/30/16  6:48 PM  Result Value Ref Range Status   Specimen Description RIGHT ANTECUBITAL  Final   Special Requests BOTTLES DRAWN AEROBIC AND ANAEROBIC 5CC  Final   Culture  Setup Time   Final    GRAM POSITIVE COCCI IN CLUSTERS IN BOTH AEROBIC AND ANAEROBIC BOTTLES CRITICAL RESULT CALLED TO, READ BACK BY AND VERIFIED WITH: N. Batchelder Pharm.D. 11:35 08/31/16  ([email or web address])    Culture METHICILLIN RESISTANT STAPHYLOCOCCUS AUREUS (A)  Final   Report Status 09/02/2016 FINAL  Final   Organism ID, Bacteria METHICILLIN RESISTANT STAPHYLOCOCCUS AUREUS  Final      Susceptibility   Methicillin resistant staphylococcus aureus - MIC*    CIPROFLOXACIN <=0.5 SENSITIVE Sensitive     ERYTHROMYCIN >=8 RESISTANT Resistant     GENTAMICIN <=0.5 SENSITIVE Sensitive     OXACILLIN >=4 RESISTANT Resistant     TETRACYCLINE <=1 SENSITIVE Sensitive     VANCOMYCIN <=0.5 SENSITIVE Sensitive     TRIMETH/SULFA <=10 SENSITIVE Sensitive     CLINDAMYCIN <=0.25 SENSITIVE Sensitive     RIFAMPIN <=0.5 SENSITIVE Sensitive     Inducible Clindamycin  NEGATIVE Sensitive     * METHICILLIN RESISTANT STAPHYLOCOCCUS AUREUS  Blood Culture ID Panel (Reflexed)     Status: Abnormal   Collection Time: 08/30/16  6:48 PM  Result Value Ref Range Status   Enterococcus species NOT DETECTED NOT DETECTED Final   Listeria monocytogenes NOT DETECTED NOT DETECTED Final   Staphylococcus species DETECTED (A) NOT DETECTED Final    Comment: CRITICAL RESULT CALLED TO, READ BACK BY AND VERIFIED WITH: N. Batchelder Pharm.D. 11:35 08/31/16 ([email or web address])    Staphylococcus aureus DETECTED (A) NOT DETECTED Final    Comment: CRITICAL RESULT CALLED TO, READ BACK BY AND VERIFIED WITH: N. Batchelder Pharm.D. 11:35 08/31/16 (wilsonm)    Methicillin resistance DETECTED (A) NOT DETECTED Final    Comment: CRITICAL RESULT CALLED TO, READ BACK BY AND VERIFIED WITH: N. Batchelder Pharm.D. 11:35 08/31/16 (wilsonm)    Streptococcus species NOT DETECTED NOT DETECTED Final   Streptococcus agalactiae NOT DETECTED NOT DETECTED Final   Streptococcus pneumoniae NOT DETECTED NOT DETECTED Final   Streptococcus pyogenes NOT DETECTED NOT DETECTED Final   Acinetobacter baumannii NOT DETECTED NOT DETECTED Final   Enterobacteriaceae species NOT DETECTED NOT DETECTED Final   Enterobacter cloacae complex NOT DETECTED NOT DETECTED Final   Escherichia coli NOT DETECTED NOT DETECTED Final   Klebsiella oxytoca NOT DETECTED NOT DETECTED Final   Klebsiella pneumoniae NOT DETECTED NOT DETECTED Final   Proteus species NOT DETECTED NOT DETECTED Final   Serratia marcescens NOT DETECTED NOT DETECTED Final   Haemophilus influenzae NOT DETECTED NOT DETECTED Final   Neisseria meningitidis NOT DETECTED NOT DETECTED Final   Pseudomonas aeruginosa NOT DETECTED NOT DETECTED Final   Candida albicans NOT DETECTED NOT DETECTED Final   Candida glabrata NOT DETECTED NOT DETECTED Final   Candida krusei NOT DETECTED NOT DETECTED Final   Candida parapsilosis NOT DETECTED NOT DETECTED Final   Candida tropicalis NOT  DETECTED NOT DETECTED Final  Blood Culture (routine x 2)  Status: Abnormal   Collection Time: 08/30/16  6:59 PM  Result Value Ref Range Status   Specimen Description LEFT ANTECUBITAL  Final   Special Requests BOTTLES DRAWN AEROBIC AND ANAEROBIC  5CC  Final   Culture  Setup Time   Final    GRAM POSITIVE COCCI IN CLUSTERS IN BOTH AEROBIC AND ANAEROBIC BOTTLES CRITICAL VALUE NOTED.  VALUE IS CONSISTENT WITH PREVIOUSLY REPORTED AND CALLED VALUE.    Culture (A)  Final    STAPHYLOCOCCUS AUREUS SUSCEPTIBILITIES PERFORMED ON PREVIOUS CULTURE WITHIN THE LAST 5 DAYS.    Report Status 09/02/2016 FINAL  Final  Blood Culture ID Panel (Reflexed)     Status: Abnormal   Collection Time: 08/30/16  6:59 PM  Result Value Ref Range Status   Enterococcus species NOT DETECTED NOT DETECTED Final   Listeria monocytogenes NOT DETECTED NOT DETECTED Final   Staphylococcus species DETECTED (A) NOT DETECTED Final    Comment: CRITICAL RESULT CALLED TO, READ BACK BY AND VERIFIED WITH: J. [place] [facility] 08/31/16 AT 2211 BY J. FUDESCO    Staphylococcus aureus DETECTED (A) NOT DETECTED Final    Comment: CRITICAL RESULT CALLED TO, READ BACK BY AND VERIFIED WITH: J. [place] PHARM 08/31/16 AT 2211 BY J. FUDESCO    Methicillin resistance DETECTED (A) NOT DETECTED Final    Comment: CRITICAL RESULT CALLED TO, READ BACK BY AND VERIFIED WITH: J. [place] [facility] 08/31/16 AT 2211 BY J. FUDESCO    Streptococcus species NOT DETECTED NOT DETECTED Final   Streptococcus agalactiae NOT DETECTED NOT DETECTED Final   Streptococcus pneumoniae NOT DETECTED NOT DETECTED Final   Streptococcus pyogenes NOT DETECTED NOT DETECTED Final   Acinetobacter baumannii NOT DETECTED NOT DETECTED Final   Enterobacteriaceae species NOT DETECTED NOT DETECTED Final   Enterobacter cloacae complex NOT DETECTED NOT DETECTED Final   Escherichia coli NOT DETECTED NOT DETECTED Final   Klebsiella oxytoca NOT DETECTED NOT DETECTED Final   Klebsiella  pneumoniae NOT DETECTED NOT DETECTED Final   Proteus species NOT DETECTED NOT DETECTED Final   Serratia marcescens NOT DETECTED NOT DETECTED Final   Haemophilus influenzae NOT DETECTED NOT DETECTED Final   Neisseria meningitidis NOT DETECTED NOT DETECTED Final   Pseudomonas aeruginosa NOT DETECTED NOT DETECTED Final   Candida albicans NOT DETECTED NOT DETECTED Final   Candida glabrata NOT DETECTED NOT DETECTED Final   Candida krusei NOT DETECTED NOT DETECTED Final   Candida parapsilosis NOT DETECTED NOT DETECTED Final   Candida tropicalis NOT DETECTED NOT DETECTED Final  Urine culture     Status: None   Collection Time: 08/30/16  7:26 PM  Result Value Ref Range Status   Specimen Description URINE, RANDOM  Final   Special Requests NONE  Final   Culture NO GROWTH  Final   Report Status 09/01/2016 FINAL  Final  Culture, blood (routine x 2)     Status: Abnormal   Collection Time: 09/01/16  3:22 AM  Result Value Ref Range Status   Specimen Description BLOOD LEFT ARM  Final   Special Requests IN PEDIATRIC BOTTLE 2CC  Final   Culture  Setup Time   Final    GRAM POSITIVE COCCI IN CLUSTERS IN PEDIATRIC BOTTLE CRITICAL RESULT CALLED TO, READ BACK BY AND VERIFIED WITH: TO VBYRK(PHARD) BY TCLEVELAND 09/02/2016 AT 00:44 AM    Culture (A)  Final    STAPHYLOCOCCUS AUREUS SUSCEPTIBILITIES PERFORMED ON PREVIOUS CULTURE WITHIN THE LAST 5 DAYS.    Report Status 09/02/2016 FINAL  Final  Blood Culture ID  Panel (Reflexed)     Status: Abnormal   Collection Time: 09/01/16  3:22 AM  Result Value Ref Range Status   Enterococcus species NOT DETECTED NOT DETECTED Final   Listeria monocytogenes NOT DETECTED NOT DETECTED Final   Staphylococcus species DETECTED (A) NOT DETECTED Final    Comment: CRITICAL RESULT CALLED TO, READ BACK BY AND VERIFIED WITH: TO VBYRK(PHARMD) BY TCLEVELAND 09/02/2016 AT 12:44AM    Staphylococcus aureus DETECTED (A) NOT DETECTED Final    Comment: CRITICAL RESULT CALLED TO, READ  BACK BY AND VERIFIED WITH: TO VBYRK(PHARMD) BY TCLEVELAND 09/02/2016 AT 12:44AM    Methicillin resistance DETECTED (A) NOT DETECTED Final    Comment: CRITICAL RESULT CALLED TO, READ BACK BY AND VERIFIED WITH: TO VBYRK(PHARMD) BY TCLEVELAND 09/02/2016 AT 12:44AM    Streptococcus species NOT DETECTED NOT DETECTED Final   Streptococcus agalactiae NOT DETECTED NOT DETECTED Final   Streptococcus pneumoniae NOT DETECTED NOT DETECTED Final   Streptococcus pyogenes NOT DETECTED NOT DETECTED Final   Acinetobacter baumannii NOT DETECTED NOT DETECTED Final   Enterobacteriaceae species NOT DETECTED NOT DETECTED Final   Enterobacter cloacae complex NOT DETECTED NOT DETECTED Final   Escherichia coli NOT DETECTED NOT DETECTED Final   Klebsiella oxytoca NOT DETECTED NOT DETECTED Final   Klebsiella pneumoniae NOT DETECTED NOT DETECTED Final   Proteus species NOT DETECTED NOT DETECTED Final   Serratia marcescens NOT DETECTED NOT DETECTED Final   Haemophilus influenzae NOT DETECTED NOT DETECTED Final   Neisseria meningitidis NOT DETECTED NOT DETECTED Final   Pseudomonas aeruginosa NOT DETECTED NOT DETECTED Final   Candida albicans NOT DETECTED NOT DETECTED Final   Candida glabrata NOT DETECTED NOT DETECTED Final   Candida krusei NOT DETECTED NOT DETECTED Final   Candida parapsilosis NOT DETECTED NOT DETECTED Final   Candida tropicalis NOT DETECTED NOT DETECTED Final  Culture, blood (routine x 2)     Status: None (Preliminary result)   Collection Time: 09/01/16  6:24 AM  Result Value Ref Range Status   Specimen Description BLOOD BLOOD RIGHT HAND  Final   Special Requests BOTTLES DRAWN AEROBIC AND ANAEROBIC 5CC  Final   Culture NO GROWTH 1 DAY  Final   Report Status PENDING  Incomplete    Radiology Studies: No results found.  Scheduled Meds: . amLODipine  10 mg Oral Daily  . aspirin  325 mg Oral Daily  . atorvastatin  40 mg Oral q1800  . enoxaparin (LOVENOX) injection  40 mg Subcutaneous Q24H  .  feeding supplement (GLUCERNA SHAKE)  237 mL Oral TID WC  . gabapentin  100 mg Oral TID  . insulin aspart  0-15 Units Subcutaneous TID WC  . insulin aspart  3 Units Subcutaneous TID WC  . insulin glargine  20 Units Subcutaneous QHS  . mouth rinse  15 mL Mouth Rinse BID  . metoprolol succinate  100 mg Oral Daily  . multivitamin with minerals  1 tablet Oral Daily  . mupirocin cream   Topical Daily  . potassium chloride  40 mEq Oral BID  . thiamine  100 mg Oral Daily  . vancomycin  1,250 mg Intravenous Q12H   Continuous Infusions:   LOS: 4 days   Time spent: 20 minutes   Faye Ramsay, MD [facility] Pager [phone]  If 7PM-7AM, please contact night-coverage [email or web address] Password [facility] 09/03/2016, 11:26 AM

## 2016-09-03 NOTE — Progress Notes (Signed)
Physical Therapy Treatment Patient Details Name: Andrew [name] MRN: [id number] DOB: 05-Jun-1946 Today's Date: 09/03/2016    History of Present Illness Pt admitted with chest pain, fever, SOB and R elbow pain. + for MRSA bacteremia and R elbow cellulits. PMH: DM, AAA, CHF, HTN, blindness in R eye, anxiety and presumed endocarditis.     PT Comments    Pt was seen for evaluation of current status, still very appropriate for SNF plan with dramatic weakness and struggling due to RUE pain and edema from infection.  Will continue acute therapy and work toward standing with walker as pt can tolerate the mobility of R side.  Strengthen and work on sitting balance as tolerated as well.    Follow Up Recommendations  SNF     Equipment Recommendations  None recommended by PT    Recommendations for Other Services       Precautions / Restrictions Precautions Precautions: Fall (telemetry) Restrictions Weight Bearing Restrictions: No    Mobility  Bed Mobility Overal bed mobility: Needs Assistance Bed Mobility: Supine to Sit;Sit to Supine Rolling: Min assist   Supine to sit: Mod assist Sit to supine: Mod assist;Max assist   General bed mobility comments: cannot use RUE and has dramatic drainage on the bed from RUE wound and infection  Transfers Overall transfer level: Needs assistance Equipment used: Rolling walker (2 wheeled);1 person hand held assist (attempted AD but is not able and did HHA instead) Transfers: Sit to/from [facility] Sit to Stand: Mod assist Stand pivot transfers: Max assist;Mod assist;From elevated surface (pt refused to attempt to step pivot to chair)       General transfer comment: increased time and effort but also very unaware of how to perform even with PT cuing sequence  Ambulation/Gait             General Gait Details: too weak   Stairs            Wheelchair Mobility    Modified Rankin (Stroke Patients Only)        Balance Overall balance assessment: Needs assistance   Sitting balance-Leahy Scale: Poor       Standing balance-Leahy Scale: Zero Standing balance comment: listing to the right when PT stood him up                    Cognition Arousal/Alertness: Awake/alert Behavior During Therapy: Flat affect Overall Cognitive Status: No family/caregiver present to determine baseline cognitive functioning Area of Impairment: Orientation;Memory;Following commands;Safety/judgement;Awareness;Problem solving;Attention Orientation Level: Time;Situation Current Attention Level: Selective Memory: Decreased short-term memory;Decreased recall of precautions Following Commands: Follows one step commands inconsistently Safety/Judgement: Decreased awareness of deficits;Decreased awareness of safety Awareness: Intellectual Problem Solving: Slow processing;Decreased initiation;Difficulty sequencing;Requires verbal cues;Requires tactile cues General Comments: Pt cannot remember his abilities to stand and move, asks to stand when he is clearly trying to sit    Exercises      General Comments        Pertinent Vitals/Pain Pain Assessment: Faces Faces Pain Scale: Hurts whole lot Pain Location: R arm Pain Descriptors / Indicators: Heaviness;Pressure;Guarding    Home Living                      Prior Function            PT Goals (current goals can now be found in the care plan section) Acute Rehab PT Goals Patient Stated Goal: to rest Progress towards PT goals: Progressing toward goals  Frequency    Min 2X/week      PT Plan Current plan remains appropriate    Co-evaluation             End of Session Equipment Utilized During Treatment: Gait belt Activity Tolerance: Patient limited by fatigue;Patient limited by pain;Treatment limited secondary to medical complications (Comment) Patient left: in bed;with call bell/phone within reach;with bed alarm set;with  nursing/sitter in room (CNA in to feed him lunch)     Time: [id number] PT Time Calculation (min) (ACUTE ONLY): 23 min  Charges:  $Therapeutic Activity: 23-37 mins                    G Codes:      Andrew [name] 09-25-16, 1:43 PM   Andrew [name], PT MS Acute Rehab Dept. Number: [place] [facility] [place]

## 2016-09-04 LAB — GLUCOSE, CAPILLARY
GLUCOSE-CAPILLARY: 219 mg/dL — AB (ref 65–99)
GLUCOSE-CAPILLARY: 173 mg/dL — AB (ref 65–99)
Glucose-Capillary: 164 mg/dL — ABNORMAL HIGH (ref 65–99)
Glucose-Capillary: 181 mg/dL — ABNORMAL HIGH (ref 65–99)

## 2016-09-04 LAB — BASIC METABOLIC PANEL
ANION GAP: 8 (ref 5–15)
BUN: 11 mg/dL (ref 6–20)
CO2: 26 mmol/L (ref 22–32)
Calcium: 8.3 mg/dL — ABNORMAL LOW (ref 8.9–10.3)
Chloride: 97 mmol/L — ABNORMAL LOW (ref 101–111)
Creatinine, Ser: 0.55 mg/dL — ABNORMAL LOW (ref 0.61–1.24)
GFR calc Af Amer: >60 mL/min (ref >60)
GFR calc non Af Amer: >60 mL/min (ref >60)
GLUCOSE: 241 mg/dL — AB (ref 65–99)
POTASSIUM: 4 mmol/L (ref 3.5–5.1)
Sodium: 131 mmol/L — ABNORMAL LOW (ref 135–145)

## 2016-09-04 LAB — VANCOMYCIN, TROUGH: Vancomycin Tr: 24 ug/mL (ref 15–20)

## 2016-09-04 LAB — MAGNESIUM: Magnesium: 1.8 mg/dL (ref 1.7–2.4)

## 2016-09-04 MED ORDER — MAGNESIUM HYDROXIDE 400 MG/5ML PO SUSP
30.0000 mL | Freq: Every day | ORAL | Status: DC | PRN
  Administered 2016-09-04 – 2016-09-05 (×2): 30 mL via ORAL
  Filled 2016-09-04 (×2): qty 30

## 2016-09-04 NOTE — Progress Notes (Signed)
CRITICAL VALUE ALERT  Critical value received:  Vanc trough 24  Date of notification:  09/04/16  Time of notification:  0807  Critical value read back:Yes.    Nurse who received alert:  Waynetta Sandy, RN  MD notified (1st page):  Floor pharmacist  Time of first page:  0808  MD notified (2nd page):  Time of second page:  Responding MD:  Pharmacist  Time MD responded:  [phone]

## 2016-09-04 NOTE — Progress Notes (Signed)
Pharmacy Antibiotic Note  Andrew [name] is a 71 y.o. 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  Patient refused TEE so planning to treat with IV vancomycin for 6 weeks with anticipated stop date of 10/12/16 per ID.    Vancomycin dose was given late last night, so the level obtained this AM was drawn ~8 hours after the last dose on a Q12H regimen.  Expect true trough to be ~18 mcg/mL, which is therapeutic.  Patient's renal function returned to baseline.  He is afebrile and his WBC is improving overall.   Plan: - Continue vanc 1250mg  IV Q12H - Monitor renal fxn, clinical progress - Repeat vanc trough early next week to r/o accumulation    Height: 5\' 8"  (172.7 cm) Weight: 150 lb 4.8 oz (68.2 kg) (bed) IBW/kg (Calculated) : 68.4  Temp (24hrs), Avg:98.1 F (36.7 C), Min:98 F (36.7 C), Max:98.4 F (36.9 C)   Recent Labs Lab 08/30/16 1754 08/30/16 1804 08/30/16 2106 08/31/16 0006 09/01/16 0322 09/02/16 0514 09/02/16 1913 09/03/16 0445 09/04/16 0442 09/04/16 0716  WBC 24.8*  --   --   --  17.0* 14.2*  --  15.2*  --   --   CREATININE 1.28*  --   --   --  0.50* 0.53*  --  0.43* 0.55*  --   LATICACIDVEN  --  4.85* 1.5 1.2  --   --   --   --   --   --   VANCOTROUGH  --   --   --   --   --   --  10*  --   --  24*    Estimated Creatinine Clearance: 81.7 mL/min (by C-G formula based on SCr of 0.55 mg/dL (L)).    Allergies  Allergen Reactions  . Penicillins Rash and Other (See Comments)    Possibly rash? Has patient had a PCN reaction causing immediate rash, facial/tongue/throat swelling, SOB or lightheadedness with hypotension: YES Has patient had a PCN reaction causing severe rash involving mucus membranes or skin necrosis:NO Has patient had a PCN reaction that required hospitalization NO Has patient had a PCN reaction occurring within the last 10 years:NO If all of the above answers are "NO", then may proceed with Cephalosporin use.     Antimicrobials this admission:  Vanc 1/14 >> (2/26) Zosyn 1/14 >> 1/15  Dose adjustments this admission:  1/17 VT = 10 mcg/mL on 750mg  q12 > 1250mg  q12 (SCr 0.53) 1/19 VT = 24 mcg/mL on 1250mg  q12 (drawn ~8 hrs post last dose, true trough ~18 mcg/mL), no change for now  Microbiology results:  1/14 BCx x2 - MRSA (BCID MRSA) 1/14 UCx - negative 1/16 BCx x2 - Staph aureus 1 of 2 (BCID MRSA) 1/19 BCx x2 -   Lashina Milles D. Mina Marble, PharmD, BCPS Pager:  [phone] 11-26-202018, 8:26 AM

## 2016-09-04 NOTE — Progress Notes (Signed)
   09/04/16 1600  OT Visit Information  Last OT Received On 09/04/16  Assistance Needed +2  History of Present Illness Pt admitted with chest pain, fever, SOB and R elbow pain. + for MRSA bacteremia and R elbow cellulits. PMH: DM, AAA, CHF, HTN, blindness in R eye, anxiety and presumed endocarditis.   Precautions  Precautions Fall  Pain Assessment  Pain Assessment Faces  Faces Pain Scale 6  Pain Location all over when touched  Pain Descriptors / Indicators Grimacing;Guarding;Moaning  Pain Intervention(s) Monitored during session;Repositioned  Cognition  Arousal/Alertness Awake/alert  Behavior During Therapy Flat affect  Overall Cognitive Status Impaired/Different from baseline  Area of Impairment Following commands;Safety/judgement;Problem solving;Memory;Attention  Current Attention Level Sustained  Memory Decreased short-term memory;Decreased recall of precautions  Following Commands Follows one step commands inconsistently  Safety/Judgement Decreased awareness of deficits;Decreased awareness of safety  Problem Solving Slow processing;Decreased initiation;Difficulty sequencing;Requires verbal cues;Requires tactile cues  General Comments requiring step by step multimodal cues for mobility  ADL  Overall ADL's  Needs assistance/impaired  Grooming Wash/dry hands;Bed level;Total assistance  Upper Body Dressing  Maximal assistance;Sitting  Upper Body Dressing Details (indicate cue type and reason) places L UE through sleeve, total assist for R  Bed Mobility  Overal bed mobility Needs Assistance  Bed Mobility Sit to Supine  Sit to supine Min assist  General bed mobility comments immediately layed down toward the R, difficult to assess whether it was purposeful  or LOB  Balance  Sitting balance-Leahy Scale Poor  Sitting balance - Comments falls to L, requires B UE support  Standing balance-Leahy Scale Zero  Transfers  Equipment used Rolling walker (2 wheeled)  Transfers Sit to/from  [employer] Transfers  Sit to Stand +2 physical assistance;Mod assist  Stand pivot transfers +2 physical assistance;Max assist  General transfer comment attempted to use walker without success  OT - End of Session  Equipment Utilized During Treatment Gait belt  Activity Tolerance Patient tolerated treatment well  Patient left in bed;with call bell/phone within reach;with bed alarm set  Nurse Communication Mobility status  OT Assessment/Plan  OT Plan Discharge plan remains appropriate  OT Frequency (ACUTE ONLY) Min 2X/week  Follow Up Recommendations SNF;Supervision/Assistance - 24 hour  OT Equipment 3 in 1 bedside commode  OT Goal Progression  Progress towards OT goals Not progressing toward goals - comment  Acute Rehab OT Goals  Patient Stated Goal to go home  Time For Goal Achievement 09/15/16  Potential to Achieve Goals Good  OT Time Calculation  OT Start Time (ACUTE ONLY) 1620  OT Stop Time (ACUTE ONLY) 1640  OT Time Calculation (min) 20 min  OT General Charges  $OT Visit 1 Procedure  OT Treatments  $Therapeutic Activity 8-22 mins  September 07, 202018 Nestor Lewandowsky, OTR/L Pager: [phone]

## 2016-09-04 NOTE — Progress Notes (Signed)
Occupational Therapy Treatment Patient Details Name: Andrew [name] MRN: [id number] DOB: 25-Sep-1945 Today's Date: 04/28/202018    History of present illness Pt admitted with chest pain, fever, SOB and R elbow pain. + for MRSA bacteremia and R elbow cellulits. PMH: DM, AAA, CHF, HTN, blindness in R eye, anxiety and presumed endocarditis.    OT comments  Pt is without awareness of deficits. Two person assist required for OOB mobility with pt demonstrating poor attention and ability to sequence movement. Pt demonstrating ability to self feed with set up.SNF continues to be the most appropriate d/c setting for pt.  Follow Up Recommendations  SNF;Supervision/Assistance - 24 hour    Equipment Recommendations  3 in 1 bedside commode    Recommendations for Other Services      Precautions / Restrictions Precautions Precautions: Fall Restrictions Weight Bearing Restrictions: No       Mobility Bed Mobility Overal bed mobility: Needs Assistance Bed Mobility: Supine to Sit;Rolling Rolling: Min assist   Supine to sit: Max assist     General bed mobility comments: Pt repeatedly attempting to return to supine. Difficulty sequencing requiring physical assist for hand placement and LEs off EOB, max assist to raise trunk.  Transfers Overall transfer level: Needs assistance Equipment used: 2 person hand held assist Transfers: Sit to/from [employer] Sit to Stand: +2 physical assistance;Mod assist Stand pivot transfers: +2 physical assistance;Mod assist       General transfer comment: bed to chair, assist to rise and to steady, poor sequencing    Balance Overall balance assessment: Needs assistance   Sitting balance-Leahy Scale: Poor Sitting balance - Comments: falls to L, requires B UE support     Standing balance-Leahy Scale: Zero                     ADL Overall ADL's : Needs assistance/impaired Eating/Feeding: Set up;Sitting   Grooming: Wash/dry  hands;Sitting;Moderate assistance                                        Vision                     Perception     Praxis      Cognition   Behavior During Therapy: Flat affect Overall Cognitive Status: Impaired/Different from baseline Area of Impairment: Orientation;Memory;Attention;Following commands;Safety/judgement;Problem solving Orientation Level: Time;Situation;Place Current Attention Level: Sustained Memory: Decreased short-term memory;Decreased recall of precautions  Following Commands: Follows one step commands inconsistently Safety/Judgement: Decreased awareness of deficits;Decreased awareness of safety   Problem Solving: Slow processing;Decreased initiation;Difficulty sequencing;Requires verbal cues;Requires tactile cues      Extremity/Trunk Assessment               Exercises     Shoulder Instructions       General Comments      Pertinent Vitals/ Pain       Pain Assessment: Faces Faces Pain Scale: Hurts little more Pain Location: R arm Pain Descriptors / Indicators: Grimacing;Guarding Pain Intervention(s): Monitored during session;Repositioned  Home Living                                          Prior Functioning/Environment              Frequency  Min 2X/week  Progress Toward Goals  OT Goals(current goals can now be found in the care plan section)  Progress towards OT goals: Not progressing toward goals - comment  Acute Rehab OT Goals Patient Stated Goal: to go home OT Goal Formulation: Patient unable to participate in goal setting Time For Goal Achievement: 09/15/16 Potential to Achieve Goals: Good  Plan Discharge plan remains appropriate    Co-evaluation                 End of Session Equipment Utilized During Treatment: Gait belt   Activity Tolerance Patient tolerated treatment well   Patient Left in chair;with call bell/phone within reach;with chair alarm  set;with nursing/sitter in room   Nurse Communication Mobility status        Time: 1250-1311 OT Time Calculation (min): 21 min  Charges: OT General Charges $OT Visit: 1 Procedure OT Treatments $Therapeutic Activity: 8-22 mins  Malka So 31-Dec-202018, 2:39 PM  [phone]

## 2016-09-04 NOTE — Progress Notes (Addendum)
Patient ID: Andrew [name], male   DOB: [birth year], 71 y.o.   MRN: [id number]    PROGRESS NOTE    Andrew [name]  [id number] DOB: [birth year] DOA: 08/30/2016  PCP: Woody Seller, MD   Brief Narrative:  71 y.o. male with hypertension, hyperlipidemia, diabetes mellitus, anxiety, AAA premises 4.8 cm on MRI 04/09/15), right eye blindness, dCHF, who presented with two days duration of substernal and pressure like chest pain, SOB and right elbow pain.  ED Course: pt was found to have WBC 24.8, lactate of 4.85, troponin 0.07, acute renal injury with Cr 1.28, hyponatremia with sodium 129. Chest x-ray showed patchy atelectasis in the left base without infiltration. D-dimer positive, but CT angiograms negative for PE or infiltration.   # CTA of chest: 1. No CT evidence for acute pulmonary embolus. 2. No acute infiltrates. Mild emphysematous disease. 3. 3 mm right upper lobe pulmonary nodule.  4. Left adrenal gland enlarged with ? nodules. Follow-up CT with adrenal protocol for further evaluation.  Assessment & Plan:  Chest pain and positive trop - CT angiogram is negative for PE and infiltration - possible demands ischemia in the setting of sepsis but pt with underlying risk factors so will need monitoring  - initial troponin 0.07 --> 0.21 - cardio team was following, planned for TEE but pt has declined so cardiology team signed off  - no need to cycle trop's further  - pt with no CP this AM   Sepsis secondary to R olecranon bursitis, MRSA bacteremia  - pt met criteria for sepsis with elevated lactate, leukocytosis, tachycardia and tachypnea.  - continue vanc day #5, stop date February 26th, 2018 per ID team recommendations  - appreciate ID team assistance, ortho consulted and attempted aspirating but only trace blood obtained, no pocket of fluid observed  - appreciate Dr. 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  - continue Lantus to 20 U, along with SSI  Chronic diastolic CHF (congestive heart failure) - 2-D echo 04/09/15 showed EF of 65-70% with grade 1 diastolic dysfunction - new ECHO requested on this admission - monitor volume status closely  - Continue metoprolol and aspirin - strict I/O, daily weights  - weight trend since admission: 148 --> 145 --> 148 lbs  Acute metabolic encephalopathy - from sepsis  - more alert this AM but with persistent intermittent confusion  - advanced diet and pt tolerating well  - PT eval adone, SNF recommended, SW consulted for assistance but pt has refused placement to SNF - family called and left message to call back so we can discuss further   Hypokalemia - Mg also low but supplemented, 1.8 this AM, will give additional dose of Mg to keep Mg ~2  - repeat BMP and Mg in AM  Pressure injury - 3 full thickness abrasions, left elbow; .3X.3cm, Left outer elbow .5X2.5X.1cm and .8X.5X.1cm - WOC recommendations is to use Bactroban to provide antimicrobial benefits and promote moist healing, foam dressing to protect from further injury - inner gluteal fold and sacrum is  red and moist; appearance consistent with moisture associated skin damage. - recommend foam dressing to protect from further injury  Alcohol abuse - in remission  Essential hypertension:  - Amlodipine, metoprolol, - Hold lisinopril for now - so far reasonable inpatient control   AKI, hyponatremia, metabolic acidosis  - Cr 9.79. Likely due to prerenal secondary to dehydration, sepsis - continue to hold Lisinopril - Cr is now WNL, CO2 also WNL - BMP In AM  DVT  prophylaxis: Lovenox SQ Code Status: Full  Family Communication: Patient at bedside, no family at bedside, called daughter and left message, also called son and the phone number in the EPIC is not valid Disposition Plan: to be determined, SNF recommended but pt declining, family called to discuss   Consultants:   Cardiology   ID  Ortho   Procedures:   None  Antimicrobials:   Vancomycin 1/14 -->  Zosyn 1/14 --> 1/15   Subjective: Still with right elbow pain but overall better.   Objective: Vitals:   09/03/16 1220 09/03/16 2055 09/04/16 0604 09/04/16 1149  BP: (!) 142/60 (!) 145/77 (!) 148/61 (!) 117/57  Pulse: 82 83 81 83  Resp: _0 Temp: 98 F (36.7 C) 98.4 F (36.9 C) 98 F (36.7 C) 98.5 F (36.9 C)  TempSrc: Oral Oral Oral Oral  SpO2: 97%  98% 92%  Weight:   68.2 kg (150 lb 4.8 oz)   Height:        Intake/Output Summary (Last 24 hours) at 09/04/16 1433 Last data filed at 09/04/16 1059  Gross per 24 hour  Intake             1460 ml  Output             1800 ml  Net             -340 ml   Filed Weights   09/02/16 0455 09/03/16 0427 09/04/16 0604  Weight: 65.8 kg (145 lb 1 oz) 67.5 kg (148 lb 13 oz) 68.2 kg (150 lb 4.8 oz)    Examination:  General exam: Appears more alert, oriented to name and DOB only  Respiratory system: diminished breath sounds at bases  Cardiovascular system: RRR. No JVD, murmurs, rubs, gallops or clicks. No pedal edema. Gastrointestinal system: Abdomen is nondistended, soft and nontender. No organomegaly or masses felt. Central nervous system: Right elbow with mild erythema and still TTP  Data Reviewed: I have personally reviewed following labs and imaging studies  CBC:  Recent Labs Lab 08/30/16 1754 09/01/16 0322 09/02/16 0514 09/03/16 0445  WBC 24.8* 17.0* 14.2* 15.2*  HGB 16.5 14.5 13.8 14.0  HCT 45.4 40.0 39.6 40.0  MCV 82.8 81.3 82.0 82.1  PLT 309 251 238 892   Basic Metabolic Panel:  Recent Labs Lab  08/30/16 1754 09/01/16 0322 09/01/16 0624 09/02/16 0514 09/03/16 0445 09/04/16 0442  NA 129* 133*  --  131* 129* 131*  K 4.2 2.4*  --  2.8* 3.2* 4.0  CL 92* 100*  --  98* 94* 97*  CO2 17* 22  --  _1 GLUCOSE 279* 82  --  166* 112* 241*  BUN 29* 16  --  _2 CREATININE 1.28* 0.50*  --  0.53* 0.43* 0.55*  CALCIUM 9.9 8.7*  --  8.7* 8.4* 8.3*  MG  --   --  1.6* 1.6* 1.6* 1.8   Coagulation Profile:  Recent Labs Lab 08/30/16 2216  INR 1.19  Cardiac Enzymes:  Recent Labs Lab 08/30/16 2216 08/31/16 0231 08/31/16 0608  TROPONINI 0.07* 0.07* 0.21*   CBG:  Recent Labs Lab 09/03/16 1218 09/03/16 1620 09/03/16 2052 09/04/16 0653 09/04/16 1144  GLUCAP 163* 172* 201* 219* 164*  Urine analysis:    Component Value Date/Time   COLORURINE YELLOW 08/30/2016 1926   APPEARANCEUR HAZY (A) 08/30/2016 1926   LABSPEC 1.020 08/30/2016 1926   PHURINE 5.0 08/30/2016 1926   GLUCOSEU >=500 (A) 08/30/2016 1926   HGBUR MODERATE (A) 08/30/2016 1926   BILIRUBINUR NEGATIVE 08/30/2016 1926   KETONESUR 20 (A) 08/30/2016 1926   PROTEINUR 100 (A) 08/30/2016 1926   UROBILINOGEN 0.2 04/18/2015 0210   NITRITE NEGATIVE 08/30/2016 1926   LEUKOCYTESUR NEGATIVE 08/30/2016 1926   Recent Results (from the past 240 hour(s))  Blood Culture (routine x 2)     Status: Abnormal   Collection Time: 08/30/16  6:48 PM  Result Value Ref Range Status   Specimen Description RIGHT ANTECUBITAL  Final   Special Requests BOTTLES DRAWN AEROBIC AND ANAEROBIC 5CC  Final   Culture  Setup Time   Final    GRAM POSITIVE COCCI IN CLUSTERS IN BOTH AEROBIC AND ANAEROBIC BOTTLES CRITICAL RESULT CALLED TO, READ BACK BY AND VERIFIED WITH: N. Batchelder Pharm.D. 11:35 08/31/16  ([email or web address])    Culture METHICILLIN RESISTANT STAPHYLOCOCCUS AUREUS (A)  Final   Report Status 09/02/2016 FINAL  Final   Organism ID, Bacteria METHICILLIN RESISTANT STAPHYLOCOCCUS AUREUS  Final      Susceptibility   Methicillin resistant  staphylococcus aureus - MIC*    CIPROFLOXACIN <=0.5 SENSITIVE Sensitive     ERYTHROMYCIN >=8 RESISTANT Resistant     GENTAMICIN <=0.5 SENSITIVE Sensitive     OXACILLIN >=4 RESISTANT Resistant     TETRACYCLINE <=1 SENSITIVE Sensitive     VANCOMYCIN <=0.5 SENSITIVE Sensitive     TRIMETH/SULFA <=10 SENSITIVE Sensitive     CLINDAMYCIN <=0.25 SENSITIVE Sensitive     RIFAMPIN <=0.5 SENSITIVE Sensitive     Inducible Clindamycin NEGATIVE Sensitive     * METHICILLIN RESISTANT STAPHYLOCOCCUS AUREUS  Blood Culture ID Panel (Reflexed)     Status: Abnormal   Collection Time: 08/30/16  6:48 PM  Result Value Ref Range Status   Enterococcus species NOT DETECTED NOT DETECTED Final   Listeria monocytogenes NOT DETECTED NOT DETECTED Final   Staphylococcus species DETECTED (A) NOT DETECTED Final    Comment: CRITICAL RESULT CALLED TO, READ BACK BY AND VERIFIED WITH: N. Batchelder Pharm.D. 11:35 08/31/16 ([email or web address])    Staphylococcus aureus DETECTED (A) NOT DETECTED Final    Comment: CRITICAL RESULT CALLED TO, READ BACK BY AND VERIFIED WITH: N. Batchelder Pharm.D. 11:35 08/31/16 (wilsonm)    Methicillin resistance DETECTED (A) NOT DETECTED Final    Comment: CRITICAL RESULT CALLED TO, READ BACK BY AND VERIFIED WITH: N. Batchelder Pharm.D. 11:35 08/31/16 (wilsonm)    Streptococcus species NOT DETECTED NOT DETECTED Final   Streptococcus agalactiae NOT DETECTED NOT DETECTED Final   Streptococcus pneumoniae NOT DETECTED NOT DETECTED Final   Streptococcus pyogenes NOT DETECTED NOT DETECTED Final   Acinetobacter baumannii NOT DETECTED NOT DETECTED Final   Enterobacteriaceae species NOT DETECTED NOT DETECTED Final   Enterobacter cloacae complex NOT DETECTED NOT DETECTED Final   Escherichia coli NOT DETECTED NOT DETECTED Final   Klebsiella oxytoca NOT DETECTED NOT DETECTED Final   Klebsiella pneumoniae NOT DETECTED NOT DETECTED Final   Proteus species NOT DETECTED NOT DETECTED Final   Serratia marcescens NOT  DETECTED NOT DETECTED Final  Haemophilus influenzae NOT DETECTED NOT DETECTED Final   Neisseria meningitidis NOT DETECTED NOT DETECTED Final   Pseudomonas aeruginosa NOT DETECTED NOT DETECTED Final   Candida albicans NOT DETECTED NOT DETECTED Final   Candida glabrata NOT DETECTED NOT DETECTED Final   Candida krusei NOT DETECTED NOT DETECTED Final   Candida parapsilosis NOT DETECTED NOT DETECTED Final   Candida tropicalis NOT DETECTED NOT DETECTED Final  Blood Culture (routine x 2)     Status: Abnormal   Collection Time: 08/30/16  6:59 PM  Result Value Ref Range Status   Specimen Description LEFT ANTECUBITAL  Final   Special Requests BOTTLES DRAWN AEROBIC AND ANAEROBIC  5CC  Final   Culture  Setup Time   Final    GRAM POSITIVE COCCI IN CLUSTERS IN BOTH AEROBIC AND ANAEROBIC BOTTLES CRITICAL VALUE NOTED.  VALUE IS CONSISTENT WITH PREVIOUSLY REPORTED AND CALLED VALUE.    Culture (A)  Final    STAPHYLOCOCCUS AUREUS SUSCEPTIBILITIES PERFORMED ON PREVIOUS CULTURE WITHIN THE LAST 5 DAYS.    Report Status 09/02/2016 FINAL  Final  Blood Culture ID Panel (Reflexed)     Status: Abnormal   Collection Time: 08/30/16  6:59 PM  Result Value Ref Range Status   Enterococcus species NOT DETECTED NOT DETECTED Final   Listeria monocytogenes NOT DETECTED NOT DETECTED Final   Staphylococcus species DETECTED (A) NOT DETECTED Final    Comment: CRITICAL RESULT CALLED TO, READ BACK BY AND VERIFIED WITH: J. [place] [facility] 08/31/16 AT 2211 BY J. FUDESCO    Staphylococcus aureus DETECTED (A) NOT DETECTED Final    Comment: CRITICAL RESULT CALLED TO, READ BACK BY AND VERIFIED WITH: J. [place] [facility] 08/31/16 AT 2211 BY J. FUDESCO    Methicillin resistance DETECTED (A) NOT DETECTED Final    Comment: CRITICAL RESULT CALLED TO, READ BACK BY AND VERIFIED WITH: J. [place] [facility] 08/31/16 AT 2211 BY J. FUDESCO    Streptococcus species NOT DETECTED NOT DETECTED Final   Streptococcus agalactiae NOT DETECTED NOT DETECTED  Final   Streptococcus pneumoniae NOT DETECTED NOT DETECTED Final   Streptococcus pyogenes NOT DETECTED NOT DETECTED Final   Acinetobacter baumannii NOT DETECTED NOT DETECTED Final   Enterobacteriaceae species NOT DETECTED NOT DETECTED Final   Enterobacter cloacae complex NOT DETECTED NOT DETECTED Final   Escherichia coli NOT DETECTED NOT DETECTED Final   Klebsiella oxytoca NOT DETECTED NOT DETECTED Final   Klebsiella pneumoniae NOT DETECTED NOT DETECTED Final   Proteus species NOT DETECTED NOT DETECTED Final   Serratia marcescens NOT DETECTED NOT DETECTED Final   Haemophilus influenzae NOT DETECTED NOT DETECTED Final   Neisseria meningitidis NOT DETECTED NOT DETECTED Final   Pseudomonas aeruginosa NOT DETECTED NOT DETECTED Final   Candida albicans NOT DETECTED NOT DETECTED Final   Candida glabrata NOT DETECTED NOT DETECTED Final   Candida krusei NOT DETECTED NOT DETECTED Final   Candida parapsilosis NOT DETECTED NOT DETECTED Final   Candida tropicalis NOT DETECTED NOT DETECTED Final  Urine culture     Status: None   Collection Time: 08/30/16  7:26 PM  Result Value Ref Range Status   Specimen Description URINE, RANDOM  Final   Special Requests NONE  Final   Culture NO GROWTH  Final   Report Status 09/01/2016 FINAL  Final  Culture, blood (routine x 2)     Status: Abnormal   Collection Time: 09/01/16  3:22 AM  Result Value Ref Range Status   Specimen Description BLOOD LEFT ARM  Final   Special  Requests IN PEDIATRIC BOTTLE 2CC  Final   Culture  Setup Time   Final    GRAM POSITIVE COCCI IN CLUSTERS IN PEDIATRIC BOTTLE CRITICAL RESULT CALLED TO, READ BACK BY AND VERIFIED WITH: TO VBYRK(PHARD) BY TCLEVELAND 09/02/2016 AT 00:44 AM    Culture (A)  Final    STAPHYLOCOCCUS AUREUS SUSCEPTIBILITIES PERFORMED ON PREVIOUS CULTURE WITHIN THE LAST 5 DAYS.    Report Status 09/02/2016 FINAL  Final  Blood Culture ID Panel (Reflexed)     Status: Abnormal   Collection Time: 09/01/16  3:22 AM    Result Value Ref Range Status   Enterococcus species NOT DETECTED NOT DETECTED Final   Listeria monocytogenes NOT DETECTED NOT DETECTED Final   Staphylococcus species DETECTED (A) NOT DETECTED Final    Comment: CRITICAL RESULT CALLED TO, READ BACK BY AND VERIFIED WITH: TO VBYRK(PHARMD) BY TCLEVELAND 09/02/2016 AT 12:44AM    Staphylococcus aureus DETECTED (A) NOT DETECTED Final    Comment: CRITICAL RESULT CALLED TO, READ BACK BY AND VERIFIED WITH: TO VBYRK(PHARMD) BY TCLEVELAND 09/02/2016 AT 12:44AM    Methicillin resistance DETECTED (A) NOT DETECTED Final    Comment: CRITICAL RESULT CALLED TO, READ BACK BY AND VERIFIED WITH: TO VBYRK(PHARMD) BY TCLEVELAND 09/02/2016 AT 12:44AM    Streptococcus species NOT DETECTED NOT DETECTED Final   Streptococcus agalactiae NOT DETECTED NOT DETECTED Final   Streptococcus pneumoniae NOT DETECTED NOT DETECTED Final   Streptococcus pyogenes NOT DETECTED NOT DETECTED Final   Acinetobacter baumannii NOT DETECTED NOT DETECTED Final   Enterobacteriaceae species NOT DETECTED NOT DETECTED Final   Enterobacter cloacae complex NOT DETECTED NOT DETECTED Final   Escherichia coli NOT DETECTED NOT DETECTED Final   Klebsiella oxytoca NOT DETECTED NOT DETECTED Final   Klebsiella pneumoniae NOT DETECTED NOT DETECTED Final   Proteus species NOT DETECTED NOT DETECTED Final   Serratia marcescens NOT DETECTED NOT DETECTED Final   Haemophilus influenzae NOT DETECTED NOT DETECTED Final   Neisseria meningitidis NOT DETECTED NOT DETECTED Final   Pseudomonas aeruginosa NOT DETECTED NOT DETECTED Final   Candida albicans NOT DETECTED NOT DETECTED Final   Candida glabrata NOT DETECTED NOT DETECTED Final   Candida krusei NOT DETECTED NOT DETECTED Final   Candida parapsilosis NOT DETECTED NOT DETECTED Final   Candida tropicalis NOT DETECTED NOT DETECTED Final  Culture, blood (routine x 2)     Status: None (Preliminary result)   Collection Time: 09/01/16  6:24 AM  Result Value  Ref Range Status   Specimen Description BLOOD BLOOD RIGHT HAND  Final   Special Requests BOTTLES DRAWN AEROBIC AND ANAEROBIC 5CC  Final   Culture NO GROWTH 2 DAYS  Final   Report Status PENDING  Incomplete    Radiology Studies: No results found.  Scheduled Meds: . amLODipine  10 mg Oral Daily  . aspirin  325 mg Oral Daily  . atorvastatin  40 mg Oral q1800  . enoxaparin (LOVENOX) injection  40 mg Subcutaneous Q24H  . feeding supplement (GLUCERNA SHAKE)  237 mL Oral TID WC  . gabapentin  100 mg Oral TID  . insulin aspart  0-15 Units Subcutaneous TID WC  . insulin aspart  3 Units Subcutaneous TID WC  . insulin glargine  20 Units Subcutaneous QHS  . mouth rinse  15 mL Mouth Rinse BID  . metoprolol succinate  100 mg Oral Daily  . multivitamin with minerals  1 tablet Oral Daily  . mupirocin cream   Topical Daily  . potassium chloride  40 mEq  Oral BID  . thiamine  100 mg Oral Daily  . vancomycin  1,250 mg Intravenous Q12H   Continuous Infusions:   LOS: 5 days   Time spent: 20 minutes   Faye Ramsay, MD [facility] Pager [phone]  If 7PM-7AM, please contact night-coverage [email or web address] Password [facility] 11-13-2016, 2:33 PM

## 2016-09-04 NOTE — Progress Notes (Signed)
While working with Almyra Free, OT to help patient back from the chair to the bed, it was noted that the patient had extreme right sided weakness.  He was unable to move his arm into the sleeve of a new gown and when he was sitting on the side of the bed, he would just fall onto his right side.  Unable to assess weakness of legs while standing.  Once he was laying down, patient wouldn't cooperate to assess strength in legs by pushing or pulling against my hands.  Patient is still alert to self only, he knows he is in [place], however he couldn't say he was in the hospital.  Dr. Doyle Askew made aware and received new orders.  Will continue to monitor.

## 2016-09-04 NOTE — Progress Notes (Signed)
   Subjective:   Procedure(s) (LRB): TRANSESOPHAGEAL ECHOCARDIOGRAM (TEE) (N/A) Patient reports pain as moderate.    Objective: Vital signs in last 24 hours: Temp:  [98 F (36.7 C)-98.4 F (36.9 C)] 98 F (36.7 C) (01/19 0604) Pulse Rate:  [81-83] 81 (01/19 0604) Resp:  [18] 18 (01/19 0604) BP: (142-148)/(60-77) 148/61 (01/19 0604) SpO2:  [97 %-98 %] 98 % (01/19 0604) Weight:  [150 lb 4.8 oz (68.2 kg)] 150 lb 4.8 oz (68.2 kg) (01/19 0604)  Intake/Output from previous day: 01/18 0701 - 01/19 0700 In: 1780 [P.O.:1280; IV Piggyback:500] Out: 1965 [Urine:1965] Intake/Output this shift: Total I/O In: 250 [IV Piggyback:250] Out: -    Recent Labs  09/02/16 0514 09/03/16 0445  HGB 13.8 14.0    Recent Labs  09/02/16 0514 09/03/16 0445  WBC 14.2* 15.2*  RBC 4.83 4.87  HCT 39.6 40.0  PLT 238 244    Recent Labs  09/03/16 0445 09/04/16 0442  NA 129* 131*  K 3.2* 4.0  CL 94* 97*  CO2 25 26  BUN 12 11  CREATININE 0.43* 0.55*  GLUCOSE 112* 241*  CALCIUM 8.4* 8.3*   No results for input(s): LABPT, INR in the last 72 hours.  Neurologically intact No results found.  Assessment/Plan:   Procedure(s) (LRB): TRANSESOPHAGEAL ECHOCARDIOGRAM (TEE) (N/A) Plan:     Elbow with serous drainage. Will sign off.  My cell [phone]  Marybelle Killings March 09, 202018, 10:29 AM

## 2016-09-04 NOTE — Clinical Social Work Note (Signed)
CSW spoke with patient this morning. He is not agreeable to SNF. Patient oriented to self only. CSW called and left voicemail for patient's daughter with contact information. Will discuss SNF placement when call returned.  Dayton Scrape, [place]

## 2016-09-05 ENCOUNTER — Inpatient Hospital Stay (HOSPITAL_COMMUNITY): Payer: Medicare Other

## 2016-09-05 LAB — BASIC METABOLIC PANEL
Anion gap: 9 (ref 5–15)
BUN: 13 mg/dL (ref 6–20)
CHLORIDE: 95 mmol/L — AB (ref 101–111)
CO2: 26 mmol/L (ref 22–32)
CREATININE: 0.6 mg/dL — AB (ref 0.61–1.24)
Calcium: 8.5 mg/dL — ABNORMAL LOW (ref 8.9–10.3)
GFR calc non Af Amer: >60 mL/min (ref >60)
Glucose, Bld: 164 mg/dL — ABNORMAL HIGH (ref 65–99)
Potassium: 4.9 mmol/L (ref 3.5–5.1)
Sodium: 130 mmol/L — ABNORMAL LOW (ref 135–145)

## 2016-09-05 LAB — CBC
HCT: 40.8 % (ref 39.0–52.0)
Hemoglobin: 13.9 g/dL (ref 13.0–17.0)
MCH: 29.1 pg (ref 26.0–34.0)
MCHC: 34.1 g/dL (ref 30.0–36.0)
MCV: 85.4 fL (ref 78.0–100.0)
Platelets: 220 10*3/uL (ref 150–400)
RBC: 4.78 MIL/uL (ref 4.22–5.81)
RDW: 14.7 % (ref 11.5–15.5)
WBC: 14.9 10*3/uL — ABNORMAL HIGH (ref 4.0–10.5)

## 2016-09-05 LAB — GLUCOSE, CAPILLARY
GLUCOSE-CAPILLARY: 346 mg/dL — AB (ref 65–99)
Glucose-Capillary: 162 mg/dL — ABNORMAL HIGH (ref 65–99)
Glucose-Capillary: 233 mg/dL — ABNORMAL HIGH (ref 65–99)
Glucose-Capillary: 116 mg/dL — ABNORMAL HIGH (ref 65–99)

## 2016-09-05 LAB — MAGNESIUM: Magnesium: 1.6 mg/dL — ABNORMAL LOW (ref 1.7–2.4)

## 2016-09-05 MED ORDER — MAGNESIUM SULFATE 2 GM/50ML IV SOLN
2.0000 g | Freq: Once | INTRAVENOUS | Status: AC
  Administered 2016-09-05: 2 g via INTRAVENOUS
  Filled 2016-09-05: qty 50

## 2016-09-05 NOTE — Progress Notes (Signed)
Dr. Doyle Askew returned call saying to do wet to dry dressing change every shift.  Will continue to monitor.

## 2016-09-05 NOTE — Progress Notes (Signed)
RN changing gauze from weeping right arm and noticed large amount of pus like substance coming from right elbow.  Aniceto Boss, RN WTA assessed elbow with RN and could not remove from elbow, almost like it's stuck on something.  Wound consult placed and Dr. Doyle Askew paged. Will continue to monitor.

## 2016-09-05 NOTE — Progress Notes (Addendum)
Patient ID: Andrew [name], male   DOB: [birth year], 71 y.o.   MRN: [id number]    PROGRESS NOTE    Andrew [name]  ZJQ:[id number] DOB: 1945-11-24 DOA: 08/30/2016  PCP: Woody Seller, MD   Brief Narrative:  71 y.o. male with hypertension, hyperlipidemia, diabetes mellitus, anxiety, AAA premises 4.8 cm on MRI 04/09/15), right eye blindness, dCHF, who presented with two days duration of substernal and pressure like chest pain, SOB and right elbow pain.  ED Course: pt was found to have WBC 24.8, lactate of 4.85, troponin 0.07, acute renal injury with Cr 1.28, hyponatremia with sodium 129. Chest x-ray showed patchy atelectasis in the left base without infiltration. D-dimer positive, but CT angiograms negative for PE or infiltration.   # CTA of chest: 1. No CT evidence for acute pulmonary embolus. 2. No acute infiltrates. Mild emphysematous disease. 3. 3 mm right upper lobe pulmonary nodule.  4. Left adrenal gland enlarged with ? nodules. Follow-up CT with adrenal protocol for further evaluation.  Assessment & Plan:  Chest pain and positive trop - CT angiogram is negative for PE and infiltration - possible demands ischemia in the setting of sepsis but pt with underlying risk factors so will need monitoring  - initial troponin 0.07 --> 0.21 - cardio team was following, planned for TEE but pt has declined so cardiology team signed off  - no need to cycle trop's further  - pt with no CP this AM   Sepsis secondary to R olecranon bursitis, MRSA bacteremia  - pt met criteria for sepsis with elevated lactate, leukocytosis, tachycardia and tachypnea.  - continue vanc day #5, stop date February 26th, 2018 per ID team recommendations  - appreciate ID team assistance, ortho consulted and attempted aspirating but only trace blood obtained, no pocket of fluid observed  - appreciate Dr. 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  - continue Lantus to 20 U, along with SSI  Chronic diastolic CHF (congestive heart failure) - 2-D echo 04/09/15 showed EF of 65-70% with grade 1 diastolic dysfunction - new ECHO requested on this admission - monitor volume status closely, no IVF - Continue metoprolol and aspirin - strict I/O, daily weights  - weight trend since admission: 148 --> 145 --> 148 --> 156 lbs  Acute metabolic encephalopathy - from sepsis  - more alert this AM but with persistent intermittent confusion  - advanced diet and pt tolerating well  - PT eval adone, SNF recommended, SW consulted for assistance but pt has refused placement to SNF - family called and left message to call back so we can discuss further, no call back received yet   Hypokalemia - Mg also low but supplemented, 1.6 this AM, will give additional 2 gm IV x 1 dose  - repeat BMP and Mg in AM  Pressure injury - 3 full thickness abrasions, left elbow; .3X.3cm,  Left outer elbow .5X2.5X.1cm and .8X.5X.1cm - WOC recommendations is to use Bactroban to provide antimicrobial benefits and promote moist healing, foam dressing to protect from further injury - inner gluteal fold and sacrum is red and moist; appearance consistent with moisture associated skin damage. - recommend foam dressing to protect from further injury  Alcohol abuse - in remission  Essential hypertension:  - Amlodipine, metoprolol, - Hold lisinopril for now - so far reasonable inpatient control   AKI,  hyponatremia, metabolic acidosis  - Cr 6.60. Likely due to prerenal secondary to dehydration, sepsis - continue to hold Lisinopril - Cr is now WNL, CO2 also WNL - BMP In AM  DVT prophylaxis: Lovenox SQ Code Status: Full  Family Communication: Patient at bedside, no family at bedside, called daughter and left message, also called son and the phone number in the EPIC is not valid Disposition Plan: to be determined, SNF recommended but pt declining, family called to discuss   Consultants:   Cardiology   ID  Ortho   Procedures:   None  Antimicrobials:   Vancomycin 1/14 -->  Zosyn 1/14 --> 1/15   Subjective: Still with right elbow pain but overall better.   Objective: Vitals:   09/04/16 0604 09/04/16 1149 09/04/16 2044 09/05/16 0526  BP: (!) 148/61 (!) 117/57 131/64 (!) 151/71  Pulse: 81 83 80 78  Resp: 18 16 18 20   Temp: 98 F (36.7 C) 98.5 F (36.9 C) 98 F (36.7 C) 97.6 F (36.4 C)  TempSrc: Oral Oral Oral Oral  SpO2: 98% 92% 91% 95%  Weight: 68.2 kg (150 lb 4.8 oz)   70.9 kg (156 lb 4.8 oz)  Height:        Intake/Output Summary (Last 24 hours) at 09/05/16 1003 Last data filed at 09/05/16 0900  Gross per 24 hour  Intake              600 ml  Output             1175 ml  Net             -575 ml   Filed Weights   09/03/16 0427 09/04/16 0604 09/05/16 0526  Weight: 67.5 kg (148 lb 13 oz) 68.2 kg (150 lb 4.8 oz) 70.9 kg (156 lb 4.8 oz)    Examination:  General exam: Appears more alert, oriented to name and DOB only  Respiratory system: diminished breath sounds at bases  Cardiovascular system: RRR. No JVD, murmurs, rubs, gallops or clicks. No pedal edema. Gastrointestinal system: Abdomen is nondistended, soft and nontender. No organomegaly or masses felt. Central nervous system: Right elbow with mild erythema and still TTP  Data Reviewed: I have personally reviewed following labs and imaging studies  CBC:  Recent Labs Lab 08/30/16 1754 09/01/16 0322  09/02/16 0514 09/03/16 0445 09/05/16 0605  WBC 24.8* 17.0* 14.2* 15.2* 14.9*  HGB 16.5 14.5 13.8 14.0 13.9  HCT 45.4 40.0 39.6 40.0 40.8  MCV 82.8 81.3 82.0 82.1 85.4  PLT 309 251 238 244 630   Basic Metabolic Panel:  Recent Labs Lab 09/01/16 0322 09/01/16 0624 09/02/16 0514 09/03/16 0445 09/04/16 0442 09/05/16 0605  NA 133*  --  131* 129* 131* 130*  K 2.4*  --  2.8* 3.2* 4.0 4.9  CL 100*  --  98* 94* 97* 95*  CO2 22  --  22 25 26 26   GLUCOSE 82  --  166* 112* 241* 164*  BUN 16  --  13 12 11  13  CREATININE 0.50*  --  0.53* 0.43* 0.55* 0.60*  CALCIUM 8.7*  --  8.7* 8.4* 8.3* 8.5*  MG  --  1.6* 1.6* 1.6* 1.8 1.6*   Coagulation Profile:  Recent Labs Lab 08/30/16 2216  INR 1.19   Cardiac Enzymes:  Recent Labs Lab 08/30/16 2216 08/31/16 0231 08/31/16 0608  TROPONINI 0.07* 0.07* 0.21*   CBG:  Recent Labs Lab 09/04/16 0653 09/04/16 1144 09/04/16 1657 09/04/16 2135 09/05/16 0625  GLUCAP 219* 164* 181* 173* 162*  Urine analysis:    Component Value Date/Time   COLORURINE YELLOW 08/30/2016 1926   APPEARANCEUR HAZY (A) 08/30/2016 1926   LABSPEC 1.020 08/30/2016 1926   PHURINE 5.0 08/30/2016 1926   GLUCOSEU >=500 (A) 08/30/2016 1926   HGBUR MODERATE (A) 08/30/2016 1926   BILIRUBINUR NEGATIVE 08/30/2016 1926   KETONESUR 20 (A) 08/30/2016 1926   PROTEINUR 100 (A) 08/30/2016 1926   UROBILINOGEN 0.2 04/18/2015 0210   NITRITE NEGATIVE 08/30/2016 1926   LEUKOCYTESUR NEGATIVE 08/30/2016 1926   Recent Results (from the past 240 hour(s))  Blood Culture (routine x 2)     Status: Abnormal   Collection Time: 08/30/16  6:48 PM  Result Value Ref Range Status   Specimen Description RIGHT ANTECUBITAL  Final   Special Requests BOTTLES DRAWN AEROBIC AND ANAEROBIC 5CC  Final   Culture  Setup Time   Final    GRAM POSITIVE COCCI IN CLUSTERS IN BOTH AEROBIC AND ANAEROBIC BOTTLES CRITICAL RESULT CALLED TO, READ BACK BY AND VERIFIED WITH: N. Batchelder Pharm.D. 11:35  08/31/16  ([email or web address])    Culture METHICILLIN RESISTANT STAPHYLOCOCCUS AUREUS (A)  Final   Report Status 09/02/2016 FINAL  Final   Organism ID, Bacteria METHICILLIN RESISTANT STAPHYLOCOCCUS AUREUS  Final      Susceptibility   Methicillin resistant staphylococcus aureus - MIC*    CIPROFLOXACIN <=0.5 SENSITIVE Sensitive     ERYTHROMYCIN >=8 RESISTANT Resistant     GENTAMICIN <=0.5 SENSITIVE Sensitive     OXACILLIN >=4 RESISTANT Resistant     TETRACYCLINE <=1 SENSITIVE Sensitive     VANCOMYCIN <=0.5 SENSITIVE Sensitive     TRIMETH/SULFA <=10 SENSITIVE Sensitive     CLINDAMYCIN <=0.25 SENSITIVE Sensitive     RIFAMPIN <=0.5 SENSITIVE Sensitive     Inducible Clindamycin NEGATIVE Sensitive     * METHICILLIN RESISTANT STAPHYLOCOCCUS AUREUS  Blood Culture ID Panel (Reflexed)     Status: Abnormal   Collection Time: 08/30/16  6:48 PM  Result Value Ref Range Status   Enterococcus species NOT DETECTED NOT DETECTED Final   Listeria monocytogenes NOT DETECTED NOT DETECTED Final   Staphylococcus species DETECTED (A) NOT DETECTED Final    Comment: CRITICAL RESULT CALLED TO, READ BACK BY AND VERIFIED WITH: N. Batchelder Pharm.D. 11:35 08/31/16 (wilsonm)    Staphylococcus aureus DETECTED (A) NOT DETECTED Final    Comment: CRITICAL RESULT CALLED TO, READ BACK BY AND VERIFIED WITH: N. Batchelder Pharm.D. 11:35 08/31/16 (wilsonm)    Methicillin resistance DETECTED (A) NOT DETECTED Final    Comment: CRITICAL RESULT CALLED TO, READ BACK BY AND VERIFIED WITH: N. Batchelder Pharm.D. 11:35 08/31/16 (wilsonm)    Streptococcus species NOT DETECTED NOT DETECTED Final   Streptococcus agalactiae NOT DETECTED NOT DETECTED Final   Streptococcus pneumoniae NOT DETECTED NOT DETECTED Final   Streptococcus pyogenes NOT DETECTED NOT DETECTED Final   Acinetobacter baumannii NOT DETECTED NOT DETECTED Final   Enterobacteriaceae species NOT DETECTED NOT DETECTED Final   Enterobacter cloacae complex NOT DETECTED NOT DETECTED  Final  Escherichia coli NOT DETECTED NOT DETECTED Final   Klebsiella oxytoca NOT DETECTED NOT DETECTED Final   Klebsiella pneumoniae NOT DETECTED NOT DETECTED Final   Proteus species NOT DETECTED NOT DETECTED Final   Serratia marcescens NOT DETECTED NOT DETECTED Final   Haemophilus influenzae NOT DETECTED NOT DETECTED Final   Neisseria meningitidis NOT DETECTED NOT DETECTED Final   Pseudomonas aeruginosa NOT DETECTED NOT DETECTED Final   Candida albicans NOT DETECTED NOT DETECTED Final   Candida glabrata NOT DETECTED NOT DETECTED Final   Candida krusei NOT DETECTED NOT DETECTED Final   Candida parapsilosis NOT DETECTED NOT DETECTED Final   Candida tropicalis NOT DETECTED NOT DETECTED Final  Blood Culture (routine x 2)     Status: Abnormal   Collection Time: 08/30/16  6:59 PM  Result Value Ref Range Status   Specimen Description LEFT ANTECUBITAL  Final   Special Requests BOTTLES DRAWN AEROBIC AND ANAEROBIC  5CC  Final   Culture  Setup Time   Final    GRAM POSITIVE COCCI IN CLUSTERS IN BOTH AEROBIC AND ANAEROBIC BOTTLES CRITICAL VALUE NOTED.  VALUE IS CONSISTENT WITH PREVIOUSLY REPORTED AND CALLED VALUE.    Culture (A)  Final    STAPHYLOCOCCUS AUREUS SUSCEPTIBILITIES PERFORMED ON PREVIOUS CULTURE WITHIN THE LAST 5 DAYS.    Report Status 09/02/2016 FINAL  Final  Blood Culture ID Panel (Reflexed)     Status: Abnormal   Collection Time: 08/30/16  6:59 PM  Result Value Ref Range Status   Enterococcus species NOT DETECTED NOT DETECTED Final   Listeria monocytogenes NOT DETECTED NOT DETECTED Final   Staphylococcus species DETECTED (A) NOT DETECTED Final    Comment: CRITICAL RESULT CALLED TO, READ BACK BY AND VERIFIED WITH: J. Easton PHARM 08/31/16 AT 2211 BY J. FUDESCO    Staphylococcus aureus DETECTED (A) NOT DETECTED Final    Comment: CRITICAL RESULT CALLED TO, READ BACK BY AND VERIFIED WITH: J. Alton PHARM 08/31/16 AT 2211 BY J. FUDESCO    Methicillin resistance DETECTED (A) NOT  DETECTED Final    Comment: CRITICAL RESULT CALLED TO, READ BACK BY AND VERIFIED WITH: J. Selma PHARM 08/31/16 AT 2211 BY J. FUDESCO    Streptococcus species NOT DETECTED NOT DETECTED Final   Streptococcus agalactiae NOT DETECTED NOT DETECTED Final   Streptococcus pneumoniae NOT DETECTED NOT DETECTED Final   Streptococcus pyogenes NOT DETECTED NOT DETECTED Final   Acinetobacter baumannii NOT DETECTED NOT DETECTED Final   Enterobacteriaceae species NOT DETECTED NOT DETECTED Final   Enterobacter cloacae complex NOT DETECTED NOT DETECTED Final   Escherichia coli NOT DETECTED NOT DETECTED Final   Klebsiella oxytoca NOT DETECTED NOT DETECTED Final   Klebsiella pneumoniae NOT DETECTED NOT DETECTED Final   Proteus species NOT DETECTED NOT DETECTED Final   Serratia marcescens NOT DETECTED NOT DETECTED Final   Haemophilus influenzae NOT DETECTED NOT DETECTED Final   Neisseria meningitidis NOT DETECTED NOT DETECTED Final   Pseudomonas aeruginosa NOT DETECTED NOT DETECTED Final   Candida albicans NOT DETECTED NOT DETECTED Final   Candida glabrata NOT DETECTED NOT DETECTED Final   Candida krusei NOT DETECTED NOT DETECTED Final   Candida parapsilosis NOT DETECTED NOT DETECTED Final   Candida tropicalis NOT DETECTED NOT DETECTED Final  Urine culture     Status: None   Collection Time: 08/30/16  7:26 PM  Result Value Ref Range Status   Specimen Description URINE, RANDOM  Final   Special Requests NONE  Final   Culture NO GROWTH  Final  Report Status 09/01/2016 FINAL  Final  Culture, blood (routine x 2)     Status: Abnormal   Collection Time: 09/01/16  3:22 AM  Result Value Ref Range Status   Specimen Description BLOOD LEFT ARM  Final   Special Requests IN PEDIATRIC BOTTLE 2CC  Final   Culture  Setup Time   Final    GRAM POSITIVE COCCI IN CLUSTERS IN PEDIATRIC BOTTLE CRITICAL RESULT CALLED TO, READ BACK BY AND VERIFIED WITH: TO VBYRK(PHARD) BY TCLEVELAND 09/02/2016 AT 00:44 AM    Culture (A)   Final    STAPHYLOCOCCUS AUREUS SUSCEPTIBILITIES PERFORMED ON PREVIOUS CULTURE WITHIN THE LAST 5 DAYS.    Report Status 09/02/2016 FINAL  Final  Blood Culture ID Panel (Reflexed)     Status: Abnormal   Collection Time: 09/01/16  3:22 AM  Result Value Ref Range Status   Enterococcus species NOT DETECTED NOT DETECTED Final   Listeria monocytogenes NOT DETECTED NOT DETECTED Final   Staphylococcus species DETECTED (A) NOT DETECTED Final    Comment: CRITICAL RESULT CALLED TO, READ BACK BY AND VERIFIED WITH: TO VBYRK(PHARMD) BY TCLEVELAND 09/02/2016 AT 12:44AM    Staphylococcus aureus DETECTED (A) NOT DETECTED Final    Comment: CRITICAL RESULT CALLED TO, READ BACK BY AND VERIFIED WITH: TO VBYRK(PHARMD) BY TCLEVELAND 09/02/2016 AT 12:44AM    Methicillin resistance DETECTED (A) NOT DETECTED Final    Comment: CRITICAL RESULT CALLED TO, READ BACK BY AND VERIFIED WITH: TO VBYRK(PHARMD) BY TCLEVELAND 09/02/2016 AT 12:44AM    Streptococcus species NOT DETECTED NOT DETECTED Final   Streptococcus agalactiae NOT DETECTED NOT DETECTED Final   Streptococcus pneumoniae NOT DETECTED NOT DETECTED Final   Streptococcus pyogenes NOT DETECTED NOT DETECTED Final   Acinetobacter baumannii NOT DETECTED NOT DETECTED Final   Enterobacteriaceae species NOT DETECTED NOT DETECTED Final   Enterobacter cloacae complex NOT DETECTED NOT DETECTED Final   Escherichia coli NOT DETECTED NOT DETECTED Final   Klebsiella oxytoca NOT DETECTED NOT DETECTED Final   Klebsiella pneumoniae NOT DETECTED NOT DETECTED Final   Proteus species NOT DETECTED NOT DETECTED Final   Serratia marcescens NOT DETECTED NOT DETECTED Final   Haemophilus influenzae NOT DETECTED NOT DETECTED Final   Neisseria meningitidis NOT DETECTED NOT DETECTED Final   Pseudomonas aeruginosa NOT DETECTED NOT DETECTED Final   Candida albicans NOT DETECTED NOT DETECTED Final   Candida glabrata NOT DETECTED NOT DETECTED Final   Candida krusei NOT DETECTED NOT  DETECTED Final   Candida parapsilosis NOT DETECTED NOT DETECTED Final   Candida tropicalis NOT DETECTED NOT DETECTED Final  Culture, blood (routine x 2)     Status: None (Preliminary result)   Collection Time: 09/01/16  6:24 AM  Result Value Ref Range Status   Specimen Description BLOOD BLOOD RIGHT HAND  Final   Special Requests BOTTLES DRAWN AEROBIC AND ANAEROBIC 5CC  Final   Culture NO GROWTH 3 DAYS  Final   Report Status PENDING  Incomplete  Culture, blood (routine x 2)     Status: None (Preliminary result)   Collection Time: 09/04/16  4:41 AM  Result Value Ref Range Status   Specimen Description BLOOD LEFT HAND  Final   Special Requests BOTTLES DRAWN AEROBIC AND ANAEROBIC 5CC  Final   Culture NO GROWTH < 12 HOURS  Final   Report Status PENDING  Incomplete  Culture, blood (routine x 2)     Status: None (Preliminary result)   Collection Time: 09/04/16  4:46 AM  Result Value Ref Range Status  Specimen Description BLOOD LEFT HAND  Final   Special Requests IN PEDIATRIC BOTTLE 4CC  Final   Culture NO GROWTH < 12 HOURS  Final   Report Status PENDING  Incomplete    Radiology Studies: Mr Brain 11 Contrast  Result Date: 09/05/2016 CLINICAL DATA:  Weakness of the right side of the body. Patient uncooperative and moving. EXAM: MRI HEAD WITHOUT CONTRAST TECHNIQUE: Multiplanar, multiecho pulse sequences of the brain and surrounding structures were obtained without intravenous contrast. COMPARISON:  04/18/2015 CT FINDINGS: Brain: Fall images are degraded by motion. I believe the diffusion imaging is negative. One could question a right pontine infarction on the axial diffusion imaging, but this is not confirmed on the ADC map for the coronal images, therefore I think is probably artifactual secondary to motion. The remainder the brain does not show grossly accelerated atrophy. I do not see the widespread small-vessel changes. No evidence of an old cortical insult. There are old small vessel changes  in the basal ganglia and radiating white matter tracts. Vascular: Major vessels at the base of the brain show flow. Skull and upper cervical spine: Negative Sinuses/Orbits: Negative Other: None significant IMPRESSION: Severely motion degraded exam. No acute infarction is identified. See above discussion. There are probably small vessel changes of the basal ganglia and radiating white matter tracts. Electronically Signed   By: Nelson Chimes M.D.   On: 09/05/2016 09:56    Scheduled Meds: . amLODipine  10 mg Oral Daily  . aspirin  325 mg Oral Daily  . atorvastatin  40 mg Oral q1800  . enoxaparin (LOVENOX) injection  40 mg Subcutaneous Q24H  . feeding supplement (GLUCERNA SHAKE)  237 mL Oral TID WC  . gabapentin  100 mg Oral TID  . insulin aspart  0-15 Units Subcutaneous TID WC  . insulin aspart  3 Units Subcutaneous TID WC  . insulin glargine  20 Units Subcutaneous QHS  . mouth rinse  15 mL Mouth Rinse BID  . metoprolol succinate  100 mg Oral Daily  . multivitamin with minerals  1 tablet Oral Daily  . mupirocin cream   Topical Daily  . potassium chloride  40 mEq Oral BID  . thiamine  100 mg Oral Daily  . vancomycin  1,250 mg Intravenous Q12H   Continuous Infusions:   LOS: 6 days   Time spent: 20 minutes   Faye Ramsay, MD [facility] Pager [phone]  If 7PM-7AM, please contact night-coverage [email or web address] Password [email or web address] 09/05/2016, 10:03 AM

## 2016-09-06 LAB — BASIC METABOLIC PANEL
ANION GAP: 6 (ref 5–15)
BUN: 17 mg/dL (ref 6–20)
CO2: 27 mmol/L (ref 22–32)
Calcium: 8 mg/dL — ABNORMAL LOW (ref 8.9–10.3)
Chloride: 96 mmol/L — ABNORMAL LOW (ref 101–111)
Creatinine, Ser: 0.61 mg/dL (ref 0.61–1.24)
Glucose, Bld: 129 mg/dL — ABNORMAL HIGH (ref 65–99)
POTASSIUM: 4.8 mmol/L (ref 3.5–5.1)
SODIUM: 129 mmol/L — AB (ref 135–145)

## 2016-09-06 LAB — CBC
HCT: 37.9 % — ABNORMAL LOW (ref 39.0–52.0)
Hemoglobin: 12.9 g/dL — ABNORMAL LOW (ref 13.0–17.0)
MCH: 28.8 pg (ref 26.0–34.0)
MCHC: 34 g/dL (ref 30.0–36.0)
MCV: 84.6 fL (ref 78.0–100.0)
Platelets: 220 10*3/uL (ref 150–400)
RBC: 4.48 MIL/uL (ref 4.22–5.81)
RDW: 14.3 % (ref 11.5–15.5)
WBC: 16.6 10*3/uL — AB (ref 4.0–10.5)

## 2016-09-06 LAB — GLUCOSE, CAPILLARY
GLUCOSE-CAPILLARY: 140 mg/dL — AB (ref 65–99)
GLUCOSE-CAPILLARY: 120 mg/dL — AB (ref 65–99)
GLUCOSE-CAPILLARY: 144 mg/dL — AB (ref 65–99)
Glucose-Capillary: 142 mg/dL — ABNORMAL HIGH (ref 65–99)
Glucose-Capillary: 162 mg/dL — ABNORMAL HIGH (ref 65–99)

## 2016-09-06 LAB — MAGNESIUM: MAGNESIUM: 2 mg/dL (ref 1.7–2.4)

## 2016-09-06 LAB — CULTURE, BLOOD (ROUTINE X 2): Culture: NO GROWTH

## 2016-09-06 MED ORDER — FUROSEMIDE 10 MG/ML IJ SOLN
40.0000 mg | Freq: Once | INTRAMUSCULAR | Status: AC
  Administered 2016-09-06: 40 mg via INTRAVENOUS
  Filled 2016-09-06: qty 4

## 2016-09-06 NOTE — Progress Notes (Addendum)
Patient ID: Andrew [name], male   DOB: 1946-05-21, 71 y.o.   MRN: [id number]  error

## 2016-09-06 NOTE — Progress Notes (Signed)
Patient ID: Andrew [name], male   DOB: 01-29-1946, 71 y.o.   MRN: [id number]    PROGRESS NOTE    Andrew [name]  BMW:[id number] DOB: Jan 04, 1946 DOA: 08/30/2016  PCP: Woody Seller, MD   Brief Narrative:  71 y.o. male with hypertension, hyperlipidemia, diabetes mellitus, anxiety, AAA premises 4.8 cm on MRI 04/09/15), right eye blindness, dCHF, who presented with two days duration of substernal and pressure like chest pain, SOB and right elbow pain.  ED Course: pt was found to have WBC 24.8, lactate of 4.85, troponin 0.07, acute renal injury with Cr 1.28, hyponatremia with sodium 129. Chest x-ray showed patchy atelectasis in the left base without infiltration. D-dimer positive, but CT angiograms negative for PE or infiltration.   # CTA of chest: 1. No CT evidence for acute pulmonary embolus. 2. No acute infiltrates. Mild emphysematous disease. 3. 3 mm right upper lobe pulmonary nodule.  4. Left adrenal gland enlarged with ? nodules. Follow-up CT with adrenal protocol for further evaluation.  Assessment & Plan:  Chest pain and positive trop - CT angiogram is negative for PE and infiltration - possible demands ischemia in the setting of sepsis but pt with underlying risk factors so will need monitoring  - initial troponin 0.07 --> 0.21 - cardio team was following, planned for TEE but pt has declined so cardiology team signed off  - no need to cycle trop's further  - pt with no CP this AM   Sepsis secondary to R olecranon bursitis, MRSA bacteremia  - pt met criteria for sepsis with elevated lactate, leukocytosis, tachycardia and tachypnea.  - continue vanc day #8, stop date February 26th, 2018 per ID team recommendations  - appreciate ID team assistance, ortho consulted and attempted aspirating but only trace blood obtained, no pocket of fluid observed  - appreciate Dr. 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  - continue Lantus to 20 U, along with SSI  Chronic diastolic CHF (congestive heart failure) - 2-D echo 04/09/15 showed EF of 65-70% with grade 1 diastolic dysfunction - new ECHO requested on this admission - monitor volume status closely, no IVF - Continue metoprolol and aspirin - strict I/O, daily weights  - weight trend since admission: 148 --> 145 --> 148 --> 156 --> 160 lbs  - will add lasix 40 mg IV   Acute metabolic encephalopathy - from sepsis  - more alert this AM but with persistent intermittent confusion  - advanced diet and pt tolerating well  - PT eval adone, SNF recommended, SW consulted for assistance but pt has refused placement to SNF - family called and left message to call back so we can discuss further, no call back received yet   Hypokalemia - Mg also low but supplemented and Mg is WNL this AM  - K is WNL  this  AM - repeat BMP and Mg in AM  Pressure injury - 3 full thickness abrasions, left elbow; .3X.3cm, Left outer elbow .5X2.5X.1cm and .8X.5X.1cm - WOC recommendations is to use Bactroban to provide antimicrobial benefits and promote moist healing, foam dressing to protect from further injury - inner gluteal fold and sacrum is red and moist; appearance consistent with moisture associated skin damage. - recommend foam dressing to protect from further injury  Alcohol abuse - in remission  Essential hypertension:  - Amlodipine, metoprolol, -  Hold lisinopril for now - so far reasonable inpatient control   AKI, hyponatremia, metabolic acidosis  - Cr 9.62. Likely due to prerenal secondary to dehydration, sepsis - continue to hold Lisinopril - Cr is now WNL, CO2 also WNL - BMP In AM  DVT prophylaxis: Lovenox SQ Code Status: Full  Family Communication: Patient at bedside, no family at bedside, called daughter and left message, also called son and the phone number in the EPIC is not valid Disposition Plan: to be determined, SNF recommended but pt declining, family called to discuss   Consultants:   Cardiology   ID  Ortho   Procedures:   None  Antimicrobials:   Vancomycin 1/14 -->  Zosyn 1/14 --> 1/15   Subjective: Still with right elbow pain but overall better.   Objective: Vitals:   09/05/16 2106 09/06/16 0606 09/06/16 1021 09/06/16 1230  BP: (!) 152/76 124/80 (!) 153/78 (!) 100/54  Pulse: 82 77 78 84  Resp: 18 16  18   Temp: 99.4 F (37.4 C) 98.9 F (37.2 C)  98.6 F (37 C)  TempSrc: Oral Oral  Oral  SpO2: 93% 94% 90% 93%  Weight:  72.8 kg (160 lb 9.6 oz)    Height:        Intake/Output Summary (Last 24 hours) at 09/06/16 1639 Last data filed at 09/06/16 1300  Gross per 24 hour  Intake              970 ml  Output              950 ml  Net               20 ml   Filed Weights   09/04/16 0604 09/05/16 0526 09/06/16 0606  Weight: 68.2 kg (150 lb 4.8 oz) 70.9 kg (156 lb 4.8 oz) 72.8 kg (160 lb 9.6 oz)    Examination:  General exam: Appears more alert, oriented to name and DOB only  Respiratory system: diminished breath sounds at bases  Cardiovascular system: RRR. No JVD, murmurs, rubs, gallops or clicks. No pedal edema. Gastrointestinal system: Abdomen is nondistended, soft and nontender. No organomegaly or masses felt. Central nervous system: Right elbow with mild erythema and still TTP, swelling from hand to elbow   Data Reviewed: I have personally reviewed following labs and imaging  studies  CBC:  Recent Labs Lab 09/01/16 0322 09/02/16 0514 09/03/16 0445 09/05/16 0605 09/06/16 0317  WBC 17.0* 14.2* 15.2* 14.9* 16.6*  HGB 14.5 13.8 14.0 13.9 12.9*  HCT 40.0 39.6 40.0 40.8 37.9*  MCV 81.3 82.0 82.1 85.4 84.6  PLT 251 238 244 220 229   Basic Metabolic Panel:  Recent Labs Lab 09/02/16 0514 09/03/16 0445 09/04/16 0442 09/05/16 0605 09/06/16 0317  NA 131* 129* 131* 130* 129*  K 2.8* 3.2* 4.0 4.9 4.8  CL 98* 94* 97* 95* 96*  CO2 22 25 26 26 27   GLUCOSE 166* 112* 241* 164* 129*  BUN 13 12  11 13 17   CREATININE 0.53* 0.43* 0.55* 0.60* 0.61  CALCIUM 8.7* 8.4* 8.3* 8.5* 8.0*  MG 1.6* 1.6* 1.8 1.6* 2.0   Coagulation Profile:  Recent Labs Lab 08/30/16 2216  INR 1.19   Cardiac Enzymes:  Recent Labs Lab 08/30/16 2216 08/31/16 0231 08/31/16 0608  TROPONINI 0.07* 0.07* 0.21*   CBG:  Recent Labs Lab 09/05/16 1634 09/05/16 2102 09/06/16 0558 09/06/16 0631 09/06/16 1217  GLUCAP 346* 116* 142* 140* 162*  Urine analysis:    Component Value Date/Time   COLORURINE YELLOW 08/30/2016 1926   APPEARANCEUR HAZY (A) 08/30/2016 1926   LABSPEC 1.020 08/30/2016 1926   PHURINE 5.0 08/30/2016 1926   GLUCOSEU >=500 (A) 08/30/2016 1926   HGBUR MODERATE (A) 08/30/2016 1926   BILIRUBINUR NEGATIVE 08/30/2016 1926   KETONESUR 20 (A) 08/30/2016 1926   PROTEINUR 100 (A) 08/30/2016 1926   UROBILINOGEN 0.2 04/18/2015 0210   NITRITE NEGATIVE 08/30/2016 1926   LEUKOCYTESUR NEGATIVE 08/30/2016 1926   Recent Results (from the past 240 hour(s))  Blood Culture (routine x 2)     Status: Abnormal   Collection Time: 08/30/16  6:48 PM  Result Value Ref Range Status   Specimen Description RIGHT ANTECUBITAL  Final   Special Requests BOTTLES DRAWN AEROBIC AND ANAEROBIC 5CC  Final   Culture  Setup Time   Final    GRAM POSITIVE COCCI IN CLUSTERS IN BOTH AEROBIC AND ANAEROBIC BOTTLES CRITICAL RESULT CALLED TO, READ BACK BY AND VERIFIED WITH: N. Batchelder Pharm.D. 11:35  08/31/16  ([email or web address])    Culture METHICILLIN RESISTANT STAPHYLOCOCCUS AUREUS (A)  Final   Report Status 09/02/2016 FINAL  Final   Organism ID, Bacteria METHICILLIN RESISTANT STAPHYLOCOCCUS AUREUS  Final      Susceptibility   Methicillin resistant staphylococcus aureus - MIC*    CIPROFLOXACIN <=0.5 SENSITIVE Sensitive     ERYTHROMYCIN >=8 RESISTANT Resistant     GENTAMICIN <=0.5 SENSITIVE Sensitive     OXACILLIN >=4 RESISTANT Resistant     TETRACYCLINE <=1 SENSITIVE Sensitive     VANCOMYCIN <=0.5 SENSITIVE Sensitive     TRIMETH/SULFA <=10 SENSITIVE Sensitive     CLINDAMYCIN <=0.25 SENSITIVE Sensitive     RIFAMPIN <=0.5 SENSITIVE Sensitive     Inducible Clindamycin NEGATIVE Sensitive     * METHICILLIN RESISTANT STAPHYLOCOCCUS AUREUS  Blood Culture ID Panel (Reflexed)     Status: Abnormal   Collection Time: 08/30/16  6:48 PM  Result Value Ref Range Status   Enterococcus species NOT DETECTED NOT DETECTED Final   Listeria monocytogenes NOT DETECTED NOT DETECTED Final   Staphylococcus species DETECTED (A) NOT DETECTED Final    Comment: CRITICAL RESULT CALLED TO, READ BACK BY AND VERIFIED WITH: N. Batchelder Pharm.D. 11:35 08/31/16 ([email or web address])    Staphylococcus aureus DETECTED (A) NOT DETECTED Final    Comment: CRITICAL RESULT CALLED TO, READ BACK BY AND VERIFIED WITH: N. Batchelder Pharm.D. 11:35 08/31/16 (wilsonm)    Methicillin resistance DETECTED (A) NOT DETECTED Final    Comment: CRITICAL RESULT CALLED TO, READ BACK BY AND VERIFIED WITH: N. Batchelder Pharm.D. 11:35 08/31/16 (wilsonm)    Streptococcus species NOT DETECTED NOT DETECTED Final   Streptococcus agalactiae NOT DETECTED NOT DETECTED Final   Streptococcus pneumoniae NOT DETECTED NOT DETECTED Final   Streptococcus pyogenes NOT DETECTED NOT DETECTED Final   Acinetobacter baumannii NOT DETECTED NOT DETECTED Final   Enterobacteriaceae species NOT DETECTED NOT DETECTED Final   Enterobacter cloacae complex NOT DETECTED NOT DETECTED  Final   Escherichia coli NOT DETECTED NOT  DETECTED Final   Klebsiella oxytoca NOT DETECTED NOT DETECTED Final   Klebsiella pneumoniae NOT DETECTED NOT DETECTED Final   Proteus species NOT DETECTED NOT DETECTED Final   Serratia marcescens NOT DETECTED NOT DETECTED Final   Haemophilus influenzae NOT DETECTED NOT DETECTED Final   Neisseria meningitidis NOT DETECTED NOT DETECTED Final   Pseudomonas aeruginosa NOT DETECTED NOT DETECTED Final   Candida albicans NOT DETECTED NOT DETECTED Final   Candida glabrata NOT DETECTED NOT DETECTED Final   Candida krusei NOT DETECTED NOT DETECTED Final   Candida parapsilosis NOT DETECTED NOT DETECTED Final   Candida tropicalis NOT DETECTED NOT DETECTED Final  Blood Culture (routine x 2)     Status: Abnormal   Collection Time: 08/30/16  6:59 PM  Result Value Ref Range Status   Specimen Description LEFT ANTECUBITAL  Final   Special Requests BOTTLES DRAWN AEROBIC AND ANAEROBIC  5CC  Final   Culture  Setup Time   Final    GRAM POSITIVE COCCI IN CLUSTERS IN BOTH AEROBIC AND ANAEROBIC BOTTLES CRITICAL VALUE NOTED.  VALUE IS CONSISTENT WITH PREVIOUSLY REPORTED AND CALLED VALUE.    Culture (A)  Final    STAPHYLOCOCCUS AUREUS SUSCEPTIBILITIES PERFORMED ON PREVIOUS CULTURE WITHIN THE LAST 5 DAYS.    Report Status 09/02/2016 FINAL  Final  Blood Culture ID Panel (Reflexed)     Status: Abnormal   Collection Time: 08/30/16  6:59 PM  Result Value Ref Range Status   Enterococcus species NOT DETECTED NOT DETECTED Final   Listeria monocytogenes NOT DETECTED NOT DETECTED Final   Staphylococcus species DETECTED (A) NOT DETECTED Final    Comment: CRITICAL RESULT CALLED TO, READ BACK BY AND VERIFIED WITH: J. [place] [facility] 08/31/16 AT 2211 BY J. FUDESCO    Staphylococcus aureus DETECTED (A) NOT DETECTED Final    Comment: CRITICAL RESULT CALLED TO, READ BACK BY AND VERIFIED WITH: J. [place] [facility] 08/31/16 AT 2211 BY J. FUDESCO    Methicillin resistance DETECTED (A) NOT  DETECTED Final    Comment: CRITICAL RESULT CALLED TO, READ BACK BY AND VERIFIED WITH: J. [place] [facility] 08/31/16 AT 2211 BY J. FUDESCO    Streptococcus species NOT DETECTED NOT DETECTED Final   Streptococcus agalactiae NOT DETECTED NOT DETECTED Final   Streptococcus pneumoniae NOT DETECTED NOT DETECTED Final   Streptococcus pyogenes NOT DETECTED NOT DETECTED Final   Acinetobacter baumannii NOT DETECTED NOT DETECTED Final   Enterobacteriaceae species NOT DETECTED NOT DETECTED Final   Enterobacter cloacae complex NOT DETECTED NOT DETECTED Final   Escherichia coli NOT DETECTED NOT DETECTED Final   Klebsiella oxytoca NOT DETECTED NOT DETECTED Final   Klebsiella pneumoniae NOT DETECTED NOT DETECTED Final   Proteus species NOT DETECTED NOT DETECTED Final   Serratia marcescens NOT DETECTED NOT DETECTED Final   Haemophilus influenzae NOT DETECTED NOT DETECTED Final   Neisseria meningitidis NOT DETECTED NOT DETECTED Final   Pseudomonas aeruginosa NOT DETECTED NOT DETECTED Final   Candida albicans NOT DETECTED NOT DETECTED Final   Candida glabrata NOT DETECTED NOT DETECTED Final   Candida krusei NOT DETECTED NOT DETECTED Final   Candida parapsilosis NOT DETECTED NOT DETECTED Final   Candida tropicalis NOT DETECTED NOT DETECTED Final  Urine culture     Status: None   Collection Time: 08/30/16  7:26 PM  Result Value Ref Range Status   Specimen Description URINE, RANDOM  Final   Special Requests NONE  Final   Culture NO GROWTH  Final   Report Status 09/01/2016 FINAL  Final  Culture, blood (routine x 2)     Status: Abnormal   Collection Time: 09/01/16  3:22 AM  Result Value Ref Range Status   Specimen Description BLOOD LEFT ARM  Final   Special Requests IN PEDIATRIC BOTTLE 2CC  Final   Culture  Setup Time   Final    GRAM POSITIVE COCCI IN CLUSTERS IN PEDIATRIC BOTTLE CRITICAL RESULT CALLED TO, READ BACK BY AND VERIFIED WITH: TO VBYRK(PHARD) BY TCLEVELAND 09/02/2016 AT 00:44 AM    Culture (A)   Final    STAPHYLOCOCCUS AUREUS SUSCEPTIBILITIES PERFORMED ON PREVIOUS CULTURE WITHIN THE LAST 5 DAYS.    Report Status 09/02/2016 FINAL  Final  Blood Culture ID Panel (Reflexed)     Status: Abnormal   Collection Time: 09/01/16  3:22 AM  Result Value Ref Range Status   Enterococcus species NOT DETECTED NOT DETECTED Final   Listeria monocytogenes NOT DETECTED NOT DETECTED Final   Staphylococcus species DETECTED (A) NOT DETECTED Final    Comment: CRITICAL RESULT CALLED TO, READ BACK BY AND VERIFIED WITH: TO VBYRK(PHARMD) BY TCLEVELAND 09/02/2016 AT 12:44AM    Staphylococcus aureus DETECTED (A) NOT DETECTED Final    Comment: CRITICAL RESULT CALLED TO, READ BACK BY AND VERIFIED WITH: TO VBYRK(PHARMD) BY TCLEVELAND 09/02/2016 AT 12:44AM    Methicillin resistance DETECTED (A) NOT DETECTED Final    Comment: CRITICAL RESULT CALLED TO, READ BACK BY AND VERIFIED WITH: TO VBYRK(PHARMD) BY TCLEVELAND 09/02/2016 AT 12:44AM    Streptococcus species NOT DETECTED NOT DETECTED Final   Streptococcus agalactiae NOT DETECTED NOT DETECTED Final   Streptococcus pneumoniae NOT DETECTED NOT DETECTED Final   Streptococcus pyogenes NOT DETECTED NOT DETECTED Final   Acinetobacter baumannii NOT DETECTED NOT DETECTED Final   Enterobacteriaceae species NOT DETECTED NOT DETECTED Final   Enterobacter cloacae complex NOT DETECTED NOT DETECTED Final   Escherichia coli NOT DETECTED NOT DETECTED Final   Klebsiella oxytoca NOT DETECTED NOT DETECTED Final   Klebsiella pneumoniae NOT DETECTED NOT DETECTED Final   Proteus species NOT DETECTED NOT DETECTED Final   Serratia marcescens NOT DETECTED NOT DETECTED Final   Haemophilus influenzae NOT DETECTED NOT DETECTED Final   Neisseria meningitidis NOT DETECTED NOT DETECTED Final   Pseudomonas aeruginosa NOT DETECTED NOT DETECTED Final   Candida albicans NOT DETECTED NOT DETECTED Final   Candida glabrata NOT DETECTED NOT DETECTED Final   Candida krusei NOT DETECTED NOT  DETECTED Final   Candida parapsilosis NOT DETECTED NOT DETECTED Final   Candida tropicalis NOT DETECTED NOT DETECTED Final  Culture, blood (routine x 2)     Status: None   Collection Time: 09/01/16  6:24 AM  Result Value Ref Range Status   Specimen Description BLOOD BLOOD RIGHT HAND  Final   Special Requests BOTTLES DRAWN AEROBIC AND ANAEROBIC 5CC  Final   Culture NO GROWTH 5 DAYS  Final   Report Status 09/06/2016 FINAL  Final  Culture, blood (routine x 2)     Status: None (Preliminary result)   Collection Time: 09/04/16  4:41 AM  Result Value Ref Range Status   Specimen Description BLOOD LEFT HAND  Final   Special Requests BOTTLES DRAWN AEROBIC AND ANAEROBIC 5CC  Final   Culture NO GROWTH 2 DAYS  Final   Report Status PENDING  Incomplete  Culture, blood (routine x 2)     Status: None (Preliminary result)   Collection Time: 09/04/16  4:46 AM  Result Value Ref Range Status   Specimen Description BLOOD LEFT HAND  Final   Special Requests IN PEDIATRIC BOTTLE 4CC  Final   Culture NO GROWTH 2 DAYS  Final   Report Status PENDING  Incomplete    Radiology Studies: Mr Brain 16 Contrast  Result Date: 09/05/2016 CLINICAL DATA:  Weakness of the right side of the body. Patient uncooperative and moving. EXAM: MRI HEAD WITHOUT CONTRAST TECHNIQUE: Multiplanar, multiecho pulse sequences of the brain and surrounding structures were obtained without intravenous contrast. COMPARISON:  04/18/2015 CT FINDINGS: Brain: Fall images are degraded by motion. I believe the diffusion imaging is negative. One could question a right pontine infarction on the axial diffusion imaging, but this is not confirmed on the ADC map for the coronal images, therefore I think is probably artifactual secondary to motion. The remainder the brain does not show grossly accelerated atrophy. I do not see the widespread small-vessel changes. No evidence of an old cortical insult. There are old small vessel changes in the basal ganglia and  radiating white matter tracts. Vascular: Major vessels at the base of the brain show flow. Skull and upper cervical spine: Negative Sinuses/Orbits: Negative Other: None significant IMPRESSION: Severely motion degraded exam. No acute infarction is identified. See above discussion. There are probably small vessel changes of the basal ganglia and radiating white matter tracts. Electronically Signed   By: Nelson Chimes M.D.   On: 09/05/2016 09:56    Scheduled Meds: . amLODipine  10 mg Oral Daily  . aspirin  325 mg Oral Daily  . atorvastatin  40 mg Oral q1800  . enoxaparin (LOVENOX) injection  40 mg Subcutaneous Q24H  . feeding supplement (GLUCERNA SHAKE)  237 mL Oral TID WC  . gabapentin  100 mg Oral TID  . insulin aspart  0-15 Units Subcutaneous TID WC  . insulin aspart  3 Units Subcutaneous TID WC  . insulin glargine  20 Units Subcutaneous QHS  . mouth rinse  15 mL Mouth Rinse BID  . metoprolol succinate  100 mg Oral Daily  . multivitamin with minerals  1 tablet Oral Daily  . mupirocin cream   Topical Daily  . thiamine  100 mg Oral Daily  . vancomycin  1,250 mg Intravenous Q12H   Continuous Infusions:   LOS: 7 days   Time spent: 20 minutes   Faye Ramsay, MD [facility] Pager [phone]  If 7PM-7AM, please contact night-coverage [email or web address] Password [facility] 09/06/2016, 4:39 PM

## 2016-09-06 NOTE — Progress Notes (Signed)
Patient unable/unwilling to keep good position in bed, got up to chair with max 2 assist, moves feet very little, argumentative with staff, does not follow directions.  Patient sat up in chair for approximately 2 hours then insisted on getting back to bed.  Argumentative with staff, even swatted at the nurse, would not move feet or follow directions from staff.

## 2016-09-06 NOTE — Progress Notes (Signed)
Patient with little po intake during 7 a to 7 p shift, refuses staff to feed him but then does not feed himself.  Patient very resistant to any care.  Dressing change to right elbow, large amount of beige tissue protruding from elbow, wet to dry applied as per order.

## 2016-09-07 LAB — BASIC METABOLIC PANEL
ANION GAP: 7 (ref 5–15)
BUN: 20 mg/dL (ref 6–20)
CHLORIDE: 94 mmol/L — AB (ref 101–111)
CO2: 26 mmol/L (ref 22–32)
Calcium: 8.1 mg/dL — ABNORMAL LOW (ref 8.9–10.3)
Creatinine, Ser: 0.78 mg/dL (ref 0.61–1.24)
GFR calc Af Amer: >60 mL/min (ref >60)
Glucose, Bld: 63 mg/dL — ABNORMAL LOW (ref 65–99)
POTASSIUM: 4.5 mmol/L (ref 3.5–5.1)
SODIUM: 127 mmol/L — AB (ref 135–145)

## 2016-09-07 LAB — GLUCOSE, CAPILLARY
GLUCOSE-CAPILLARY: 54 mg/dL — AB (ref 65–99)
GLUCOSE-CAPILLARY: 70 mg/dL (ref 65–99)
GLUCOSE-CAPILLARY: 119 mg/dL — AB (ref 65–99)
Glucose-Capillary: 127 mg/dL — ABNORMAL HIGH (ref 65–99)
Glucose-Capillary: 77 mg/dL (ref 65–99)
Glucose-Capillary: 102 mg/dL — ABNORMAL HIGH (ref 65–99)

## 2016-09-07 LAB — CBC
HCT: 37.3 % — ABNORMAL LOW (ref 39.0–52.0)
HEMOGLOBIN: 12.9 g/dL — AB (ref 13.0–17.0)
MCH: 29.5 pg (ref 26.0–34.0)
MCHC: 34.6 g/dL (ref 30.0–36.0)
MCV: 85.2 fL (ref 78.0–100.0)
PLATELETS: 268 10*3/uL (ref 150–400)
RBC: 4.38 MIL/uL (ref 4.22–5.81)
RDW: 14.1 % (ref 11.5–15.5)
WBC: 21.3 10*3/uL — ABNORMAL HIGH (ref 4.0–10.5)

## 2016-09-07 MED ORDER — ENSURE ENLIVE PO LIQD
237.0000 mL | ORAL | Status: DC
  Administered 2016-09-09 – 2016-09-14 (×5): 237 mL via ORAL

## 2016-09-07 MED ORDER — INSULIN GLARGINE 100 UNIT/ML ~~LOC~~ SOLN
10.0000 [IU] | Freq: Once | SUBCUTANEOUS | Status: AC
  Administered 2016-09-07: 10 [IU] via SUBCUTANEOUS
  Filled 2016-09-07: qty 0.1

## 2016-09-07 MED ORDER — GLUCOSE 40 % PO GEL
ORAL | Status: AC
  Administered 2016-09-07: 37.5 g
  Filled 2016-09-07: qty 1

## 2016-09-07 MED ORDER — INSULIN GLARGINE 100 UNIT/ML ~~LOC~~ SOLN
18.0000 [IU] | Freq: Every day | SUBCUTANEOUS | Status: DC
  Filled 2016-09-07: qty 0.18

## 2016-09-07 MED ORDER — GLUCERNA SHAKE PO LIQD
237.0000 mL | Freq: Two times a day (BID) | ORAL | Status: DC
  Administered 2016-09-08 – 2016-09-14 (×2): 237 mL via ORAL

## 2016-09-07 MED ORDER — INSULIN ASPART 100 UNIT/ML ~~LOC~~ SOLN: 0.0000 [IU] | Freq: Three times a day (TID) | SUBCUTANEOUS | Status: DC

## 2016-09-07 NOTE — Progress Notes (Signed)
Nutrition Follow-up  DOCUMENTATION CODES:   Severe malnutrition in context of chronic illness  INTERVENTION:  Continue Glucerna Shake po BID, each supplement provides 220 kcal and 10 grams of protein Provide Ensure Enlive HS, provides 350 kcal and 20 grams of protein  NUTRITION DIAGNOSIS:   Malnutrition related to chronic illness as evidenced by severe depletion of body fat, severe depletion of muscle mass.  ongoing  GOAL:   Patient will meet greater than or equal to 90% of their needs  unmet  MONITOR:   PO intake, Supplement acceptance, Labs, Weight trends, Skin, I & O's  REASON FOR ASSESSMENT:   Low Braden    ASSESSMENT:   71 y.o. male with medical history significant of hypertension, hyperlipidemia, diabetes mellitus, ETOH abuse, anxiety, AAA premises 4.8 cm on MRI 04/09/15), right eye blindness, dCHF, who presents with chest pain, SOB and right elbow pain.  Pt asleep at time of visit. Per nursing notes, pt only ate 10% of dinner last night, refused Glucerna Shake this AM and had hypoglycemic event this AM. He did eat 50% of breakfast and drink one Glucerna Shake later this morning. Per order history, pt refuses Glucerna Shakes about 50% of the time. When diet was first advanced pt ate 50-85% of meals for a couple days, now meal completion per nursing notes is 0% to 50%. Pt's weight is up 14 lbs from 6 days ago.   Labs: glucose ranging 54 to 162 mg/dL, low sodium, low calcium, low chloride,   Diet Order:  Diet regular Room service appropriate? Yes; Fluid consistency: Thin  Skin:  Wound (see comment) (abrasions and cellulitis on L elbow)  Last BM:  1/20  Height:   Ht Readings from Last 1 Encounters:  08/30/16 5\' 8"  (1.727 m)    Weight:   Wt Readings from Last 1 Encounters:  09/07/16 158 lb 3.2 oz (71.8 kg)    Ideal Body Weight:  70 kg  BMI:  Body mass index is 24.05 kg/m.  Estimated Nutritional Needs:   Kcal:  2000-2200  Protein:  90-100  grams  Fluid:  2 L/day  EDUCATION NEEDS:   No education needs identified at this time  [place], CSP, LDN Inpatient Clinical Dietitian Pager: [phone] After Hours Pager: [phone]

## 2016-09-07 NOTE — Progress Notes (Signed)
Hypoglycemic Event  CBG: 54  Treatment: Glucose oral Gel  Symptoms: Patient reports no symptoms  Follow-up CBG: [id number] CBG Result:70  Possible Reasons for Event: Patient got a dose of Lantus last night. Did not eat a lot at dinner  Comments/MD notified:Protocol    Danae Chen A Tucker Steedley

## 2016-09-07 NOTE — Progress Notes (Signed)
Patient has been noncompliant to nurses care overnight. Wants to be left alone. Patient ate less than 10% of dinner refused glucerna shake this am. Patient curses at nurse when she attempts to give care. Patient did allow nurse to do dressing change to elbow this am. No significant changes overnight. Hypoglycemic event this am treated with po glucose gel since patient refuses to drink fluids.

## 2016-09-07 NOTE — Progress Notes (Signed)
Physical Therapy Treatment Patient Details Name: Andrew [name] MRN: [id number] DOB: [birth year] Today's Date: 09/07/2016    History of Present Illness Pt admitted with chest pain, fever, SOB and R elbow pain. + for MRSA bacteremia and R elbow cellulits. PMH: DM, AAA, CHF, HTN, blindness in R eye, anxiety and presumed endocarditis.     PT Comments    Patient seen for progression and reassessment of activity. At this time, patient remains +2 physical assist for all aspects of mobility. Patient continues to demonstrate pain as limiting factor with RUE and cognitive deficits requiring multi-modal cues throughout session.   OF NOTE: Upon entering the room, patient was found with upper portion of gown saturated and patient was biting his IV out of his hand. Nsg called to room to assist.   Will continue to see and progress as tolerated, continue to recommend ST SNF upon acute discharge.  Follow Up Recommendations  SNF     Equipment Recommendations  None recommended by PT    Recommendations for Other Services       Precautions / Restrictions Precautions Precautions: Fall Restrictions Weight Bearing Restrictions: No    Mobility  Bed Mobility Overal bed mobility: Needs Assistance Bed Mobility: Sit to Supine Rolling: Min assist   Supine to sit: Max assist Sit to supine: Min assist   General bed mobility comments: increased physical assist to bring LEs off EOB and elevate trunk to upright position. Some assist for repositioning and rolling in bed. Multi modal cues during session.  Transfers Overall transfer level: Needs assistance Equipment used: 2 person hand held assist Transfers: Sit to/from [facility] Sit to Stand: +2 physical assistance;Mod assist         General transfer comment: Continues to required moderate assist of 2 person to elevate to standing with increased need for initiation from therapist  Ambulation/Gait             General Gait  Details: not able to perform at this time   Stairs            Wheelchair Mobility    Modified Rankin (Stroke Patients Only)       Balance Overall balance assessment: Needs assistance Sitting-balance support: Feet supported Sitting balance-Leahy Scale: Poor Sitting balance - Comments: Left lateral lean present but over compensated to the right when attempting to correct positioning. Tolerated increased time EOB ~10 minutes Postural control: Left lateral lean Standing balance support: Bilateral upper extremity supported;During functional activity Standing balance-Leahy Scale: Zero Standing balance comment: unable to maintain static standing                    Cognition Arousal/Alertness: Awake/alert Behavior During Therapy: Flat affect Overall Cognitive Status: Impaired/Different from baseline Area of Impairment: Following commands;Safety/judgement;Problem solving;Memory;Attention Orientation Level: Disoriented to;Time;Situation (could not state the year) Current Attention Level: Sustained Memory: Decreased short-term memory;Decreased recall of precautions Following Commands: Follows one step commands inconsistently Safety/Judgement: Decreased awareness of deficits;Decreased awareness of safety Awareness: Intellectual Problem Solving: Slow processing;Decreased initiation;Difficulty sequencing;Requires verbal cues;Requires tactile cues General Comments: patient biting IV out of hand upon entering the room     Exercises      General Comments        Pertinent Vitals/Pain Pain Assessment: Faces Faces Pain Scale: Hurts even more Pain Location: RUE with touch and movement Pain Descriptors / Indicators: Grimacing;Guarding;Moaning;Tender Pain Intervention(s): Monitored during session    Home Living  Prior Function            PT Goals (current goals can now be found in the care plan section) Acute Rehab PT Goals Patient Stated  Goal: to go home PT Goal Formulation: With patient Time For Goal Achievement: 09/16/16 Potential to Achieve Goals: Fair Progress towards PT goals: Progressing toward goals (modest progression increased time EOB)    Frequency    Min 2X/week      PT Plan Current plan remains appropriate    Co-evaluation             End of Session Equipment Utilized During Treatment: Gait belt Activity Tolerance: Patient limited by fatigue;Patient limited by pain;Treatment limited secondary to medical complications (Comment) Patient left: in bed;with call bell/phone within reach;with bed alarm set     Time: 0950-1011 PT Time Calculation (min) (ACUTE ONLY): 21 min  Charges:  $Therapeutic Activity: 8-22 mins                    G Codes:      Duncan Dull 10-06-16, 11:57 AM Alben Deeds, PT DPT  [phone]

## 2016-09-07 NOTE — Progress Notes (Signed)
Patient ID: Andrew [name], male   DOB: September 24, 1945, 71 y.o.   MRN: [id number]    PROGRESS NOTE    Andrew [name]  [id number] DOB: 20-Apr-1946 DOA: 08/30/2016  PCP: Woody Seller, MD   Brief Narrative:  71 y.o. male with hypertension, hyperlipidemia, diabetes mellitus, anxiety, AAA premises 4.8 cm on MRI 04/09/15), right eye blindness, dCHF, who presented with two days duration of substernal and pressure like chest pain, SOB and right elbow pain.  ED Course: pt was found to have WBC 24.8, lactate of 4.85, troponin 0.07, acute renal injury with Cr 1.28, hyponatremia with sodium 129. Chest x-ray showed patchy atelectasis in the left base without infiltration. D-dimer positive, but CT angiograms negative for PE or infiltration.   # CTA of chest: 1. No CT evidence for acute pulmonary embolus. 2. No acute infiltrates. Mild emphysematous disease. 3. 3 mm right upper lobe pulmonary nodule.  4. Left adrenal gland enlarged with ? nodules. Follow-up CT with adrenal protocol for further evaluation.  Assessment & Plan:  Chest pain and positive trop - CT angiogram is negative for PE and infiltration - possible demands ischemia in the setting of sepsis but pt with underlying risk factors so will need monitoring  - initial troponin 0.07 --> 0.21 - cardio team was following, planned for TEE but pt has declined so cardiology team signed off  - no need to cycle trop's further  - pt with no CP this AM    Sepsis secondary to R olecranon bursitis, MRSA bacteremia  - pt met criteria for sepsis with elevated lactate, leukocytosis, tachycardia and tachypnea.  - continue vanc day #9, stop date February 26th, 2018 per ID team recommendations  - appreciate ID team assistance, ortho consulted and attempted aspirating but only trace blood obtained, no pocket of fluid observed  - appreciate Dr. Lorin Mercy assistance, Right arm still swollen, keep extremity elevated  - WBC 24 K on admission which is now  trending up from  17 --> 14 --> 15 --> 16 --> 21 K - repeat blood cultures obtained 1/16, so far with no growth  - pt declined TEE so needs total 6 weeks of ABX therapy  - pt also declining placement to SNF and we are still waiting for family to call to further discuss - pt currently afebrile but concern is persistent leukocytosis, will need to monitor closely - CBC in AM  Right sided weakness, upper and lower extremity  - noted to be worse on 1/19 - MRI with no acute infarctions noted  - continue with PT/OT  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  - continue Lantus but lower to 18 U, along with SSI  Chronic diastolic CHF (congestive heart failure) - 2-D echo 04/09/15 showed EF of 65-70% with grade 1 diastolic dysfunction - new ECHO requested on this admission - monitor volume status closely, no IVF - Continue metoprolol and aspirin - strict I/O, daily weights  - one dose of lasix 40 mg IV given 1/21 - weight trend since admission: 148 --> 145 --> 148 --> 156 --> 160 --> 158 lbs   Acute metabolic encephalopathy - from sepsis  - more alert this AM but with persistent intermittent confusion, this AM was biting IV line  - advanced diet and pt tolerating well  - PT eval adone, SNF recommended, SW consulted for assistance but pt has refused placement to SNF - family called and left message to call back so we can  discuss further, no call back received yet   Hypokalemia - Mg also low but supplemented and Mg is WNL this AM  - K is WNL this  AM - repeat BMP and Mg in AM  Pressure injury - 3 full thickness abrasions, left elbow; .3X.3cm, Left outer elbow .5X2.5X.1cm and .8X.5X.1cm - WOC recommendations is to use Bactroban to provide antimicrobial benefits and promote moist healing, foam dressing to protect from further injury - inner gluteal fold and sacrum is red and moist; appearance consistent with moisture associated skin damage. - recommend foam  dressing to protect from further injury  Alcohol abuse - in remission  Essential hypertension:  - Amlodipine, metoprolol, - Hold lisinopril for now - so far reasonable inpatient control   AKI, hyponatremia, metabolic acidosis  - Cr 6.31. Likely due to prerenal secondary to dehydration, sepsis - continue to hold Lisinopril - Cr is now WNL, CO2 also WNL - BMP In AM  DVT prophylaxis: Lovenox SQ Code Status: Full  Family Communication: Patient at bedside, no family at bedside, called daughter and left message, also called son and the phone number in the EPIC is not valid Disposition Plan: to be determined, SNF recommended but pt declining, family called to discuss   Consultants:   Cardiology   ID  Ortho   Procedures:   None  Antimicrobials:   Vancomycin 1/14 -->  Zosyn 1/14 --> 1/15   Subjective: Still with right elbow pain but overall better.   Objective: Vitals:   09/06/16 1230 09/06/16 2120 09/07/16 0511 09/07/16 1007  BP: (!) 100/54 99/60 118/64 122/69  Pulse: 84 81 75 97  Resp: _0 Temp: 98.6 F (37 C) 97.9 F (36.6 C) 98.3 F (36.8 C)   TempSrc: Oral Oral Oral   SpO2: 93% 95% 92%   Weight:   71.8 kg (158 lb 3.2 oz)   Height:        Intake/Output Summary (Last 24 hours) at 09/07/16 1439 Last data filed at 09/07/16 1240  Gross per 24 hour  Intake              607 ml  Output              825 ml  Net             -218 ml   Filed Weights   09/05/16 0526 09/06/16 0606 09/07/16 0511  Weight: 70.9 kg (156 lb 4.8 oz) 72.8 kg (160 lb 9.6 oz) 71.8 kg (158 lb 3.2 oz)    Examination:  General exam: Appears alert but remains confused, biting IV line  Respiratory system: diminished breath sounds at bases  Cardiovascular system: RRR. No JVD, murmurs, rubs, gallops or clicks. No pedal edema. Gastrointestinal system: Abdomen is nondistended, soft and nontender. No organomegaly or masses felt. Central nervous system: Right elbow with mild erythema  and still TTP, swelling from hand to elbow   Data Reviewed: I have personally reviewed following labs and imaging studies  CBC:  Recent Labs Lab 09/02/16 0514 09/03/16 0445 09/05/16 0605 09/06/16 0317 09/07/16 0600  WBC 14.2* 15.2* 14.9* 16.6* 21.3*  HGB 13.8 14.0 13.9 12.9* 12.9*  HCT 39.6 40.0 40.8 37.9* 37.3*  MCV 82.0 82.1 85.4 84.6 85.2  PLT 238 244 220 220 497   Basic Metabolic Panel:  Recent Labs Lab 09/02/16 0514 09/03/16 0445 09/04/16 0442 09/05/16 0605 09/06/16 0317 09/07/16 0600  NA 131* 129* 131* 130* 129* 127*  K 2.8* 3.2* 4.0 4.9 4.8  4.5  CL 98* 94* 97* 95* 96* 94*  CO2 _0 GLUCOSE 166* 112* 241* 164* 129* 63*  BUN _1 CREATININE 0.53* 0.43* 0.55* 0.60* 0.61 0.78  CALCIUM 8.7* 8.4* 8.3* 8.5* 8.0* 8.1*  MG 1.6* 1.6* 1.8 1.6* 2.0  --    CBG:  Recent Labs Lab 09/06/16 2052 09/07/16 0610 09/07/16 0635 09/07/16 1008 09/07/16 1133  GLUCAP 144* 54* 70 119* 127*  Urine analysis:    Component Value Date/Time   COLORURINE YELLOW 08/30/2016 1926   APPEARANCEUR HAZY (A) 08/30/2016 1926   LABSPEC 1.020 08/30/2016 1926   PHURINE 5.0 08/30/2016 1926   GLUCOSEU >=500 (A) 08/30/2016 1926   HGBUR MODERATE (A) 08/30/2016 1926   BILIRUBINUR NEGATIVE 08/30/2016 1926   KETONESUR 20 (A) 08/30/2016 1926   PROTEINUR 100 (A) 08/30/2016 1926   UROBILINOGEN 0.2 04/18/2015 0210   NITRITE NEGATIVE 08/30/2016 1926   LEUKOCYTESUR NEGATIVE 08/30/2016 1926   Recent Results (from the past 240 hour(s))  Blood Culture (routine x 2)     Status: Abnormal   Collection Time: 08/30/16  6:48 PM  Result Value Ref Range Status   Specimen Description RIGHT ANTECUBITAL  Final   Special Requests BOTTLES DRAWN AEROBIC AND ANAEROBIC 5CC  Final   Culture  Setup Time   Final    GRAM POSITIVE COCCI IN CLUSTERS IN BOTH AEROBIC AND ANAEROBIC BOTTLES CRITICAL RESULT CALLED TO, READ BACK BY AND VERIFIED WITH: N. Batchelder Pharm.D. 11:35 08/31/16   ([email or web address])    Culture METHICILLIN RESISTANT STAPHYLOCOCCUS AUREUS (A)  Final   Report Status 09/02/2016 FINAL  Final   Organism ID, Bacteria METHICILLIN RESISTANT STAPHYLOCOCCUS AUREUS  Final      Susceptibility   Methicillin resistant staphylococcus aureus - MIC*    CIPROFLOXACIN <=0.5 SENSITIVE Sensitive     ERYTHROMYCIN >=8 RESISTANT Resistant     GENTAMICIN <=0.5 SENSITIVE Sensitive     OXACILLIN >=4 RESISTANT Resistant     TETRACYCLINE <=1 SENSITIVE Sensitive     VANCOMYCIN <=0.5 SENSITIVE Sensitive     TRIMETH/SULFA <=10 SENSITIVE Sensitive     CLINDAMYCIN <=0.25 SENSITIVE Sensitive     RIFAMPIN <=0.5 SENSITIVE Sensitive     Inducible Clindamycin NEGATIVE Sensitive     * METHICILLIN RESISTANT STAPHYLOCOCCUS AUREUS  Blood Culture ID Panel (Reflexed)     Status: Abnormal   Collection Time: 08/30/16  6:48 PM  Result Value Ref Range Status   Enterococcus species NOT DETECTED NOT DETECTED Final   Listeria monocytogenes NOT DETECTED NOT DETECTED Final   Staphylococcus species DETECTED (A) NOT DETECTED Final    Comment: CRITICAL RESULT CALLED TO, READ BACK BY AND VERIFIED WITH: N. Batchelder Pharm.D. 11:35 08/31/16 (wilsonm)    Staphylococcus aureus DETECTED (A) NOT DETECTED Final    Comment: CRITICAL RESULT CALLED TO, READ BACK BY AND VERIFIED WITH: N. Batchelder Pharm.D. 11:35 08/31/16 (wilsonm)    Methicillin resistance DETECTED (A) NOT DETECTED Final    Comment: CRITICAL RESULT CALLED TO, READ BACK BY AND VERIFIED WITH: N. Batchelder Pharm.D. 11:35 08/31/16 (wilsonm)    Streptococcus species NOT DETECTED NOT DETECTED Final   Streptococcus agalactiae NOT DETECTED NOT DETECTED Final   Streptococcus pneumoniae NOT DETECTED NOT DETECTED Final   Streptococcus pyogenes NOT DETECTED NOT DETECTED Final   Acinetobacter baumannii NOT DETECTED NOT DETECTED Final   Enterobacteriaceae species NOT DETECTED NOT DETECTED Final   Enterobacter cloacae complex NOT DETECTED NOT DETECTED Final    Escherichia coli  NOT DETECTED NOT DETECTED Final   Klebsiella oxytoca NOT DETECTED NOT DETECTED Final   Klebsiella pneumoniae NOT DETECTED NOT DETECTED Final   Proteus species NOT DETECTED NOT DETECTED Final   Serratia marcescens NOT DETECTED NOT DETECTED Final   Haemophilus influenzae NOT DETECTED NOT DETECTED Final   Neisseria meningitidis NOT DETECTED NOT DETECTED Final   Pseudomonas aeruginosa NOT DETECTED NOT DETECTED Final   Candida albicans NOT DETECTED NOT DETECTED Final   Candida glabrata NOT DETECTED NOT DETECTED Final   Candida krusei NOT DETECTED NOT DETECTED Final   Candida parapsilosis NOT DETECTED NOT DETECTED Final   Candida tropicalis NOT DETECTED NOT DETECTED Final  Blood Culture (routine x 2)     Status: Abnormal   Collection Time: 08/30/16  6:59 PM  Result Value Ref Range Status   Specimen Description LEFT ANTECUBITAL  Final   Special Requests BOTTLES DRAWN AEROBIC AND ANAEROBIC  5CC  Final   Culture  Setup Time   Final    GRAM POSITIVE COCCI IN CLUSTERS IN BOTH AEROBIC AND ANAEROBIC BOTTLES CRITICAL VALUE NOTED.  VALUE IS CONSISTENT WITH PREVIOUSLY REPORTED AND CALLED VALUE.    Culture (A)  Final    STAPHYLOCOCCUS AUREUS SUSCEPTIBILITIES PERFORMED ON PREVIOUS CULTURE WITHIN THE LAST 5 DAYS.    Report Status 09/02/2016 FINAL  Final  Blood Culture ID Panel (Reflexed)     Status: Abnormal   Collection Time: 08/30/16  6:59 PM  Result Value Ref Range Status   Enterococcus species NOT DETECTED NOT DETECTED Final   Listeria monocytogenes NOT DETECTED NOT DETECTED Final   Staphylococcus species DETECTED (A) NOT DETECTED Final    Comment: CRITICAL RESULT CALLED TO, READ BACK BY AND VERIFIED WITH: J. [place] [facility] 08/31/16 AT 2211 BY J. FUDESCO    Staphylococcus aureus DETECTED (A) NOT DETECTED Final    Comment: CRITICAL RESULT CALLED TO, READ BACK BY AND VERIFIED WITH: J. [place] [facility] 08/31/16 AT 2211 BY J. FUDESCO    Methicillin resistance DETECTED (A) NOT DETECTED  Final    Comment: CRITICAL RESULT CALLED TO, READ BACK BY AND VERIFIED WITH: J. [place] [facility] 08/31/16 AT 2211 BY J. FUDESCO    Streptococcus species NOT DETECTED NOT DETECTED Final   Streptococcus agalactiae NOT DETECTED NOT DETECTED Final   Streptococcus pneumoniae NOT DETECTED NOT DETECTED Final   Streptococcus pyogenes NOT DETECTED NOT DETECTED Final   Acinetobacter baumannii NOT DETECTED NOT DETECTED Final   Enterobacteriaceae species NOT DETECTED NOT DETECTED Final   Enterobacter cloacae complex NOT DETECTED NOT DETECTED Final   Escherichia coli NOT DETECTED NOT DETECTED Final   Klebsiella oxytoca NOT DETECTED NOT DETECTED Final   Klebsiella pneumoniae NOT DETECTED NOT DETECTED Final   Proteus species NOT DETECTED NOT DETECTED Final   Serratia marcescens NOT DETECTED NOT DETECTED Final   Haemophilus influenzae NOT DETECTED NOT DETECTED Final   Neisseria meningitidis NOT DETECTED NOT DETECTED Final   Pseudomonas aeruginosa NOT DETECTED NOT DETECTED Final   Candida albicans NOT DETECTED NOT DETECTED Final   Candida glabrata NOT DETECTED NOT DETECTED Final   Candida krusei NOT DETECTED NOT DETECTED Final   Candida parapsilosis NOT DETECTED NOT DETECTED Final   Candida tropicalis NOT DETECTED NOT DETECTED Final  Urine culture     Status: None   Collection Time: 08/30/16  7:26 PM  Result Value Ref Range Status   Specimen Description URINE, RANDOM  Final   Special Requests NONE  Final   Culture NO GROWTH  Final   Report  Status 09/01/2016 FINAL  Final  Culture, blood (routine x 2)     Status: Abnormal   Collection Time: 09/01/16  3:22 AM  Result Value Ref Range Status   Specimen Description BLOOD LEFT ARM  Final   Special Requests IN PEDIATRIC BOTTLE 2CC  Final   Culture  Setup Time   Final    GRAM POSITIVE COCCI IN CLUSTERS IN PEDIATRIC BOTTLE CRITICAL RESULT CALLED TO, READ BACK BY AND VERIFIED WITH: TO VBYRK(PHARD) BY TCLEVELAND 09/02/2016 AT 00:44 AM    Culture (A)  Final     STAPHYLOCOCCUS AUREUS SUSCEPTIBILITIES PERFORMED ON PREVIOUS CULTURE WITHIN THE LAST 5 DAYS.    Report Status 09/02/2016 FINAL  Final  Blood Culture ID Panel (Reflexed)     Status: Abnormal   Collection Time: 09/01/16  3:22 AM  Result Value Ref Range Status   Enterococcus species NOT DETECTED NOT DETECTED Final   Listeria monocytogenes NOT DETECTED NOT DETECTED Final   Staphylococcus species DETECTED (A) NOT DETECTED Final    Comment: CRITICAL RESULT CALLED TO, READ BACK BY AND VERIFIED WITH: TO VBYRK(PHARMD) BY TCLEVELAND 09/02/2016 AT 12:44AM    Staphylococcus aureus DETECTED (A) NOT DETECTED Final    Comment: CRITICAL RESULT CALLED TO, READ BACK BY AND VERIFIED WITH: TO VBYRK(PHARMD) BY TCLEVELAND 09/02/2016 AT 12:44AM    Methicillin resistance DETECTED (A) NOT DETECTED Final    Comment: CRITICAL RESULT CALLED TO, READ BACK BY AND VERIFIED WITH: TO VBYRK(PHARMD) BY TCLEVELAND 09/02/2016 AT 12:44AM    Streptococcus species NOT DETECTED NOT DETECTED Final   Streptococcus agalactiae NOT DETECTED NOT DETECTED Final   Streptococcus pneumoniae NOT DETECTED NOT DETECTED Final   Streptococcus pyogenes NOT DETECTED NOT DETECTED Final   Acinetobacter baumannii NOT DETECTED NOT DETECTED Final   Enterobacteriaceae species NOT DETECTED NOT DETECTED Final   Enterobacter cloacae complex NOT DETECTED NOT DETECTED Final   Escherichia coli NOT DETECTED NOT DETECTED Final   Klebsiella oxytoca NOT DETECTED NOT DETECTED Final   Klebsiella pneumoniae NOT DETECTED NOT DETECTED Final   Proteus species NOT DETECTED NOT DETECTED Final   Serratia marcescens NOT DETECTED NOT DETECTED Final   Haemophilus influenzae NOT DETECTED NOT DETECTED Final   Neisseria meningitidis NOT DETECTED NOT DETECTED Final   Pseudomonas aeruginosa NOT DETECTED NOT DETECTED Final   Candida albicans NOT DETECTED NOT DETECTED Final   Candida glabrata NOT DETECTED NOT DETECTED Final   Candida krusei NOT DETECTED NOT DETECTED Final     Candida parapsilosis NOT DETECTED NOT DETECTED Final   Candida tropicalis NOT DETECTED NOT DETECTED Final  Culture, blood (routine x 2)     Status: None   Collection Time: 09/01/16  6:24 AM  Result Value Ref Range Status   Specimen Description BLOOD BLOOD RIGHT HAND  Final   Special Requests BOTTLES DRAWN AEROBIC AND ANAEROBIC 5CC  Final   Culture NO GROWTH 5 DAYS  Final   Report Status 09/06/2016 FINAL  Final  Culture, blood (routine x 2)     Status: None (Preliminary result)   Collection Time: 09/04/16  4:41 AM  Result Value Ref Range Status   Specimen Description BLOOD LEFT HAND  Final   Special Requests BOTTLES DRAWN AEROBIC AND ANAEROBIC 5CC  Final   Culture NO GROWTH 2 DAYS  Final   Report Status PENDING  Incomplete  Culture, blood (routine x 2)     Status: None (Preliminary result)   Collection Time: 09/04/16  4:46 AM  Result Value Ref Range Status  Specimen Description BLOOD LEFT HAND  Final   Special Requests IN PEDIATRIC BOTTLE 4CC  Final   Culture NO GROWTH 2 DAYS  Final   Report Status PENDING  Incomplete    Radiology Studies: No results found.  Scheduled Meds: . amLODipine  10 mg Oral Daily  . aspirin  325 mg Oral Daily  . atorvastatin  40 mg Oral q1800  . enoxaparin (LOVENOX) injection  40 mg Subcutaneous Q24H  . feeding supplement (GLUCERNA SHAKE)  237 mL Oral TID WC  . gabapentin  100 mg Oral TID  . insulin aspart  0-15 Units Subcutaneous TID WC  . insulin aspart  3 Units Subcutaneous TID WC  . insulin glargine  20 Units Subcutaneous QHS  . mouth rinse  15 mL Mouth Rinse BID  . metoprolol succinate  100 mg Oral Daily  . multivitamin with minerals  1 tablet Oral Daily  . mupirocin cream   Topical Daily  . thiamine  100 mg Oral Daily  . vancomycin  1,250 mg Intravenous Q12H   Continuous Infusions:   LOS: 8 days   Time spent: 20 minutes   Faye Ramsay, MD [facility] Pager [phone]  If 7PM-7AM, please contact  night-coverage [email or web address] Password [facility] 09/07/2016, 2:39 PM

## 2016-09-07 NOTE — Progress Notes (Signed)
Inpatient Diabetes Program Recommendations  AACE/ADA: New Consensus Statement on Inpatient Glycemic Control (2015)  Target Ranges:  Prepandial:   less than 140 mg/dL      Peak postprandial:   less than 180 mg/dL (1-2 hours)      Critically ill patients:  140 - 180 mg/dL   Lab Results  Component Value Date   GLUCAP 119 (H) 09/07/2016   HGBA1C >15.5 (H) 08/31/2016    Review of Glycemic Control Results for CALOGERO, BOOSE (MRN [id number]) as of 09/07/2016 11:28  Ref. Range 09/06/2016 16:54 09/06/2016 20:52 09/07/2016 06:10 09/07/2016 06:35 09/07/2016 10:08  Glucose-Capillary Latest Ref Range: 65 - 99 mg/dL 120 (H) 144 (H) 54 (L) 70 119 (H)   Inpatient Diabetes Program Recommendations:  Noted hypoglycemia. Please consider: -Decrease Novolog correction to sensitive 0-9 units tid -Decrease Lantus to 18 units daily  Thank you, Bethena Roys E. Noorah Giammona, RN, MSN, CDE Inpatient Glycemic Control Team Team Pager [phone] (8am-5pm) 09/07/2016 11:29 AM

## 2016-09-08 ENCOUNTER — Inpatient Hospital Stay (HOSPITAL_COMMUNITY): Payer: Medicare Other

## 2016-09-08 DIAGNOSIS — J189 Pneumonia, unspecified organism: Secondary | ICD-10-CM

## 2016-09-08 DIAGNOSIS — J9601 Acute respiratory failure with hypoxia: Secondary | ICD-10-CM

## 2016-09-08 DIAGNOSIS — E871 Hypo-osmolality and hyponatremia: Secondary | ICD-10-CM

## 2016-09-08 LAB — BASIC METABOLIC PANEL
Anion gap: 9 (ref 5–15)
BUN: 22 mg/dL — AB (ref 6–20)
CALCIUM: 8 mg/dL — AB (ref 8.9–10.3)
CO2: 25 mmol/L (ref 22–32)
Chloride: 94 mmol/L — ABNORMAL LOW (ref 101–111)
Creatinine, Ser: 0.81 mg/dL (ref 0.61–1.24)
GFR calc Af Amer: >60 mL/min (ref >60)
GLUCOSE: 99 mg/dL (ref 65–99)
Potassium: 4.5 mmol/L (ref 3.5–5.1)
Sodium: 128 mmol/L — ABNORMAL LOW (ref 135–145)

## 2016-09-08 LAB — GLUCOSE, CAPILLARY
GLUCOSE-CAPILLARY: 161 mg/dL — AB (ref 65–99)
GLUCOSE-CAPILLARY: 133 mg/dL — AB (ref 65–99)
Glucose-Capillary: 60 mg/dL — ABNORMAL LOW (ref 65–99)
Glucose-Capillary: 105 mg/dL — ABNORMAL HIGH (ref 65–99)
Glucose-Capillary: 103 mg/dL — ABNORMAL HIGH (ref 65–99)

## 2016-09-08 LAB — CBC
HEMATOCRIT: 36.1 % — AB (ref 39.0–52.0)
Hemoglobin: 12.4 g/dL — ABNORMAL LOW (ref 13.0–17.0)
MCH: 29.2 pg (ref 26.0–34.0)
MCHC: 34.3 g/dL (ref 30.0–36.0)
MCV: 84.9 fL (ref 78.0–100.0)
PLATELETS: 330 10*3/uL (ref 150–400)
RBC: 4.25 MIL/uL (ref 4.22–5.81)
RDW: 14.3 % (ref 11.5–15.5)
WBC: 28.2 10*3/uL — AB (ref 4.0–10.5)

## 2016-09-08 LAB — SODIUM, URINE, RANDOM

## 2016-09-08 LAB — POCT I-STAT 3, ART BLOOD GAS (G3+)
Acid-Base Excess: 3 mmol/L — ABNORMAL HIGH (ref 0.0–2.0)
BICARBONATE: 26.6 mmol/L (ref 20.0–28.0)
O2 Saturation: 93 %
PO2 ART: 61 mmHg — AB (ref 83.0–108.0)
TCO2: 28 mmol/L (ref 0–100)
pCO2 arterial: 36.4 mmHg (ref 32.0–48.0)
pH, Arterial: 7.472 — ABNORMAL HIGH (ref 7.350–7.450)

## 2016-09-08 LAB — CREATININE, URINE, RANDOM: Creatinine, Urine: 117.48 mg/dL

## 2016-09-08 LAB — OSMOLALITY, URINE: Osmolality, Ur: 568 mOsm/kg (ref 300–900)

## 2016-09-08 LAB — MRSA PCR SCREENING: MRSA by PCR: POSITIVE — AB

## 2016-09-08 LAB — VANCOMYCIN, RANDOM: Vancomycin Rm: 25

## 2016-09-08 LAB — LACTIC ACID, PLASMA
LACTIC ACID, VENOUS: 1.1 mmol/L (ref 0.5–1.9)
LACTIC ACID, VENOUS: 0.9 mmol/L (ref 0.5–1.9)

## 2016-09-08 LAB — OSMOLALITY: OSMOLALITY: 273 mosm/kg — AB (ref 275–295)

## 2016-09-08 LAB — PROCALCITONIN: Procalcitonin: 0.22 ng/mL

## 2016-09-08 LAB — VANCOMYCIN, TROUGH: Vancomycin Tr: 36 ug/mL (ref 15–20)

## 2016-09-08 MED ORDER — DEXTROSE 5 % IV SOLN
2.0000 g | Freq: Once | INTRAVENOUS | Status: DC
  Filled 2016-09-08: qty 2

## 2016-09-08 MED ORDER — DM-GUAIFENESIN ER 30-600 MG PO TB12
1.0000 | ORAL_TABLET | Freq: Two times a day (BID) | ORAL | Status: DC
  Administered 2016-09-08 – 2016-09-15 (×13): 1 via ORAL
  Filled 2016-09-08 (×15): qty 1

## 2016-09-08 MED ORDER — INSULIN ASPART 100 UNIT/ML ~~LOC~~ SOLN
0.0000 [IU] | SUBCUTANEOUS | Status: DC
  Administered 2016-09-08 – 2016-09-10 (×3): 1 [IU] via SUBCUTANEOUS
  Administered 2016-09-10: 2 [IU] via SUBCUTANEOUS
  Administered 2016-09-10: 7 [IU] via SUBCUTANEOUS
  Administered 2016-09-10: 3 [IU] via SUBCUTANEOUS
  Administered 2016-09-11: 5 [IU] via SUBCUTANEOUS
  Administered 2016-09-11 (×2): 2 [IU] via SUBCUTANEOUS
  Administered 2016-09-11: 1 [IU] via SUBCUTANEOUS
  Administered 2016-09-12 (×4): 2 [IU] via SUBCUTANEOUS
  Administered 2016-09-13: 1 [IU] via SUBCUTANEOUS
  Administered 2016-09-13: 3 [IU] via SUBCUTANEOUS
  Administered 2016-09-13: 5 [IU] via SUBCUTANEOUS
  Administered 2016-09-14: 2 [IU] via SUBCUTANEOUS
  Administered 2016-09-14: 5 [IU] via SUBCUTANEOUS
  Administered 2016-09-14: 2 [IU] via SUBCUTANEOUS

## 2016-09-08 MED ORDER — IPRATROPIUM-ALBUTEROL 0.5-2.5 (3) MG/3ML IN SOLN
3.0000 mL | RESPIRATORY_TRACT | Status: DC
Start: 2016-09-08 — End: 2016-09-09
  Administered 2016-09-08 – 2016-09-09 (×3): 3 mL via RESPIRATORY_TRACT
  Filled 2016-09-08 (×5): qty 3

## 2016-09-08 MED ORDER — FAMOTIDINE IN NACL 20-0.9 MG/50ML-% IV SOLN
20.0000 mg | Freq: Two times a day (BID) | INTRAVENOUS | Status: DC
  Administered 2016-09-08 – 2016-09-09 (×2): 20 mg via INTRAVENOUS
  Filled 2016-09-08 (×2): qty 50

## 2016-09-08 MED ORDER — PIPERACILLIN-TAZOBACTAM 3.375 G IVPB
3.3750 g | Freq: Three times a day (TID) | INTRAVENOUS | Status: AC
  Administered 2016-09-08 – 2016-09-15 (×22): 3.375 g via INTRAVENOUS
  Filled 2016-09-08 (×24): qty 50

## 2016-09-08 NOTE — Progress Notes (Signed)
Patient ID: Andrew [name], male   DOB: 10/10/1945, 71 y.o.   MRN: [id number]    PROGRESS NOTE    Andrew [name]  BSJ:[id number] DOB: Jan 10, 1946 DOA: 08/30/2016  PCP: Woody Seller, MD   Brief Narrative:  71 y.o. male with hypertension, hyperlipidemia, diabetes mellitus, anxiety, AAA premises 4.8 cm on MRI 04/09/15), right eye blindness, dCHF, who presented with two days duration of substernal and pressure like chest pain, SOB and right elbow pain.  ED Course: pt was found to have WBC 24.8, lactate of 4.85, troponin 0.07, acute renal injury with Cr 1.28, hyponatremia with sodium 129. Chest x-ray showed patchy atelectasis in the left base without infiltration. D-dimer positive, but CT angiograms negative for PE or infiltration.   # CTA of chest: 1. No CT evidence for acute pulmonary embolus. 2. No acute infiltrates. Mild emphysematous disease. 3. 3 mm right upper lobe pulmonary nodule.  4. Left adrenal gland enlarged with ? nodules. Follow-up CT with adrenal protocol for further evaluation.  Assessment & Plan:  Chest pain and positive trop - CT angiogram is negative for PE and infiltration - possible demands ischemia in the setting of sepsis but pt with underlying risk factors so will need monitoring  - initial troponin 0.07 --> 0.21 - cardio team was following, planned for TEE but pt has declined so cardiology team signed off  - no need to cycle trop's further  - pt with no CP this AM    Sepsis secondary to R olecranon bursitis, MRSA bacteremia  - pt met criteria for sepsis with elevated lactate, leukocytosis, tachycardia and tachypnea.  - continue vanc day #10, stop date February 26th, 2018 per ID team recommendations  - appreciate ID team assistance, ortho consulted and attempted aspirating but only trace blood obtained, no pocket of fluid observed  - appreciate Dr. 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  - currently too confused, keep NPO for now  - stop Lantus until pt able to take PO   Acute respiratory failure 1/23 due to HCAP and imposed on Chronic diastolic CHF (congestive heart failure) - 2-D echo 04/09/15 showed EF of 65-70% with grade 1 diastolic dysfunction - weight trend since admission: 148 --> 145 --> 148 --> 156 --> 160 --> 158 lbs  - CXR concerning for developing LLL PNA, will add Zosyn to vanc to cover for HCAP  - monitor volume status closely   Acute metabolic encephalopathy - from sepsis  - worse this AM, more confused, moving extremities but unable to verbalize any concerns  - PCCM consulted, ? If pt protecting airways, low threshold for intubation   Hypokalemia - Mg also low but supplemented and Mg is WNL this AM  - K is WNL this  AM - repeat BMP and Mg in AM  Pressure injury - 3 full thickness abrasions, left elbow; .3X.3cm,  Left outer elbow .5X2.5X.1cm and .8X.5X.1cm - WOC recommendations is to use Bactroban to provide antimicrobial benefits and promote moist healing, foam dressing to protect from further injury - inner gluteal fold and sacrum is red and moist; appearance consistent with moisture associated skin damage. - recommend foam dressing to protect from further injury  Alcohol abuse - in remission  Essential hypertension:  - Amlodipine, metoprolol - so far reasonable inpatient control   AKI, hyponatremia, metabolic acidosis  - Cr 7.01. Likely due to prerenal  secondary to dehydration, sepsis - Cr is now WNL, CO2 also WNL - Na is still trending down  - BMP In AM  DVT prophylaxis: Lovenox SQ Code Status: Full  Family Communication: no family at bedside, called daughter and left message Disposition Plan: to be determined, transfer to ICU today per PCCM  Consultants:   Cardiology - signed off  ID - signed off  Ortho - signed off   PCCM   Antimicrobials:   Vancomycin 1/14 -->  Zosyn 1/14 --> 1/15, restarted 1/23  Subjective: More confused and dyspneic this AM  Objective: Vitals:   09/08/16 0900 09/08/16 0947 09/08/16 1000 09/08/16 1011  BP: 127/60   (!) 141/58  Pulse: 84   88  Resp: 14   18  Temp: 99 F (37.2 C)     TempSrc: Oral     SpO2: (!) 77% (!) 86% 94% 100%  Weight:      Height:        Intake/Output Summary (Last 24 hours) at 09/08/16 1020 Last data filed at 09/07/16 1240  Gross per 24 hour  Intake              237 ml  Output                0 ml  Net              237 ml   Filed Weights   09/06/16 0606 09/07/16 0511 09/08/16 0549  Weight: 72.8 kg (160 lb 9.6 oz) 71.8 kg (158 lb 3.2 oz) 71.1 kg (156 lb 11.2 oz)    Examination:  General exam: Appears confused, unable to verbalize concerns.  Respiratory system: bilateral rales and poor inspiratory effort  Cardiovascular system: RRR. No JVD, murmurs, rubs, gallops or clicks. No pedal edema. Gastrointestinal system: Abdomen is nondistended, soft and nontender. No organomegaly or masses felt. Central nervous system: Right elbow with edema and erythema and still TTP, swelling from hand to elbow   Data Reviewed: I have personally reviewed following labs and imaging studies  CBC:  Recent Labs Lab 09/03/16 0445 09/05/16 0605 09/06/16 0317 09/07/16 0600 09/08/16 0910  WBC 15.2* 14.9* 16.6* 21.3* 28.2*  HGB 14.0 13.9 12.9* 12.9* 12.4*  HCT 40.0 40.8 37.9* 37.3* 36.1*  MCV 82.1 85.4 84.6 85.2 84.9  PLT 244 220 220 268 779   Basic Metabolic  Panel:  Recent Labs Lab 09/02/16 0514 09/03/16 0445 09/04/16 0442 09/05/16 0605 09/06/16 0317 09/07/16 0600 09/08/16 0910  NA 131* 129* 131* 130* 129* 127* 128*  K 2.8* 3.2* 4.0 4.9 4.8 4.5 4.5  CL 98* 94* 97* 95* 96* 94* 94*  CO2 _0 GLUCOSE 166* 112* 241* 164* 129* 63* 99  BUN _1 22*  CREATININE 0.53* 0.43* 0.55* 0.60* 0.61 0.78 0.81  CALCIUM 8.7* 8.4* 8.3* 8.5* 8.0* 8.1* 8.0*  MG 1.6* 1.6* 1.8 1.6* 2.0  --   --  CBG:  Recent Labs Lab 09/07/16 1133 09/07/16 1623 09/07/16 2111 09/08/16 0628 09/08/16 0728  GLUCAP 127* 77 102* 60* 105*  Urine analysis:    Component Value Date/Time   COLORURINE YELLOW 08/30/2016 1926   APPEARANCEUR HAZY (A) 08/30/2016 1926   LABSPEC 1.020 08/30/2016 1926   PHURINE 5.0 08/30/2016 1926   GLUCOSEU >=500 (A) 08/30/2016 1926   HGBUR MODERATE (A) 08/30/2016 1926   BILIRUBINUR NEGATIVE 08/30/2016 1926   KETONESUR 20 (A) 08/30/2016 1926   PROTEINUR 100 (A) 08/30/2016 1926   UROBILINOGEN 0.2 04/18/2015 0210   NITRITE NEGATIVE 08/30/2016 1926   LEUKOCYTESUR NEGATIVE 08/30/2016 1926   Recent Results (from the past 240 hour(s))  Blood Culture (routine x 2)     Status: Abnormal   Collection Time: 08/30/16  6:48 PM  Result Value Ref Range Status   Specimen Description RIGHT ANTECUBITAL  Final   Special Requests BOTTLES DRAWN AEROBIC AND ANAEROBIC 5CC  Final   Culture  Setup Time   Final    GRAM POSITIVE COCCI IN CLUSTERS IN BOTH AEROBIC AND ANAEROBIC BOTTLES CRITICAL RESULT CALLED TO, READ BACK BY AND VERIFIED WITH: N. Batchelder Pharm.D. 11:35 08/31/16  ([email or web address])    Culture METHICILLIN RESISTANT STAPHYLOCOCCUS AUREUS (A)  Final   Report Status 09/02/2016 FINAL  Final   Organism ID, Bacteria METHICILLIN RESISTANT STAPHYLOCOCCUS AUREUS  Final      Susceptibility   Methicillin resistant staphylococcus aureus - MIC*    CIPROFLOXACIN <=0.5 SENSITIVE Sensitive     ERYTHROMYCIN >=8 RESISTANT Resistant      GENTAMICIN <=0.5 SENSITIVE Sensitive     OXACILLIN >=4 RESISTANT Resistant     TETRACYCLINE <=1 SENSITIVE Sensitive     VANCOMYCIN <=0.5 SENSITIVE Sensitive     TRIMETH/SULFA <=10 SENSITIVE Sensitive     CLINDAMYCIN <=0.25 SENSITIVE Sensitive     RIFAMPIN <=0.5 SENSITIVE Sensitive     Inducible Clindamycin NEGATIVE Sensitive     * METHICILLIN RESISTANT STAPHYLOCOCCUS AUREUS  Blood Culture ID Panel (Reflexed)     Status: Abnormal   Collection Time: 08/30/16  6:48 PM  Result Value Ref Range Status   Enterococcus species NOT DETECTED NOT DETECTED Final   Listeria monocytogenes NOT DETECTED NOT DETECTED Final   Staphylococcus species DETECTED (A) NOT DETECTED Final    Comment: CRITICAL RESULT CALLED TO, READ BACK BY AND VERIFIED WITH: N. Batchelder Pharm.D. 11:35 08/31/16 (wilsonm)    Staphylococcus aureus DETECTED (A) NOT DETECTED Final    Comment: CRITICAL RESULT CALLED TO, READ BACK BY AND VERIFIED WITH: N. Batchelder Pharm.D. 11:35 08/31/16 (wilsonm)    Methicillin resistance DETECTED (A) NOT DETECTED Final    Comment: CRITICAL RESULT CALLED TO, READ BACK BY AND VERIFIED WITH: N. Batchelder Pharm.D. 11:35 08/31/16 (wilsonm)    Streptococcus species NOT DETECTED NOT DETECTED Final   Streptococcus agalactiae NOT DETECTED NOT DETECTED Final   Streptococcus pneumoniae NOT DETECTED NOT DETECTED Final   Streptococcus pyogenes NOT DETECTED NOT DETECTED Final   Acinetobacter baumannii NOT DETECTED NOT DETECTED Final   Enterobacteriaceae species NOT DETECTED NOT DETECTED Final   Enterobacter cloacae complex NOT DETECTED NOT DETECTED Final   Escherichia coli NOT DETECTED NOT DETECTED Final   Klebsiella oxytoca NOT DETECTED NOT DETECTED Final   Klebsiella pneumoniae NOT DETECTED NOT DETECTED Final   Proteus species NOT DETECTED NOT DETECTED Final   Serratia marcescens NOT DETECTED NOT DETECTED Final   Haemophilus influenzae NOT DETECTED NOT DETECTED Final   Neisseria meningitidis NOT  DETECTED NOT DETECTED Final  Pseudomonas aeruginosa NOT DETECTED NOT DETECTED Final   Candida albicans NOT DETECTED NOT DETECTED Final   Candida glabrata NOT DETECTED NOT DETECTED Final   Candida krusei NOT DETECTED NOT DETECTED Final   Candida parapsilosis NOT DETECTED NOT DETECTED Final   Candida tropicalis NOT DETECTED NOT DETECTED Final  Blood Culture (routine x 2)     Status: Abnormal   Collection Time: 08/30/16  6:59 PM  Result Value Ref Range Status   Specimen Description LEFT ANTECUBITAL  Final   Special Requests BOTTLES DRAWN AEROBIC AND ANAEROBIC  5CC  Final   Culture  Setup Time   Final    GRAM POSITIVE COCCI IN CLUSTERS IN BOTH AEROBIC AND ANAEROBIC BOTTLES CRITICAL VALUE NOTED.  VALUE IS CONSISTENT WITH PREVIOUSLY REPORTED AND CALLED VALUE.    Culture (A)  Final    STAPHYLOCOCCUS AUREUS SUSCEPTIBILITIES PERFORMED ON PREVIOUS CULTURE WITHIN THE LAST 5 DAYS.    Report Status 09/02/2016 FINAL  Final  Blood Culture ID Panel (Reflexed)     Status: Abnormal   Collection Time: 08/30/16  6:59 PM  Result Value Ref Range Status   Enterococcus species NOT DETECTED NOT DETECTED Final   Listeria monocytogenes NOT DETECTED NOT DETECTED Final   Staphylococcus species DETECTED (A) NOT DETECTED Final    Comment: CRITICAL RESULT CALLED TO, READ BACK BY AND VERIFIED WITH: J. [place] [facility] 08/31/16 AT 2211 BY J. FUDESCO    Staphylococcus aureus DETECTED (A) NOT DETECTED Final    Comment: CRITICAL RESULT CALLED TO, READ BACK BY AND VERIFIED WITH: J. [place] [facility] 08/31/16 AT 2211 BY J. FUDESCO    Methicillin resistance DETECTED (A) NOT DETECTED Final    Comment: CRITICAL RESULT CALLED TO, READ BACK BY AND VERIFIED WITH: J. [place] [facility] 08/31/16 AT 2211 BY J. FUDESCO    Streptococcus species NOT DETECTED NOT DETECTED Final   Streptococcus agalactiae NOT DETECTED NOT DETECTED Final   Streptococcus pneumoniae NOT DETECTED NOT DETECTED Final   Streptococcus pyogenes NOT DETECTED NOT  DETECTED Final   Acinetobacter baumannii NOT DETECTED NOT DETECTED Final   Enterobacteriaceae species NOT DETECTED NOT DETECTED Final   Enterobacter cloacae complex NOT DETECTED NOT DETECTED Final   Escherichia coli NOT DETECTED NOT DETECTED Final   Klebsiella oxytoca NOT DETECTED NOT DETECTED Final   Klebsiella pneumoniae NOT DETECTED NOT DETECTED Final   Proteus species NOT DETECTED NOT DETECTED Final   Serratia marcescens NOT DETECTED NOT DETECTED Final   Haemophilus influenzae NOT DETECTED NOT DETECTED Final   Neisseria meningitidis NOT DETECTED NOT DETECTED Final   Pseudomonas aeruginosa NOT DETECTED NOT DETECTED Final   Candida albicans NOT DETECTED NOT DETECTED Final   Candida glabrata NOT DETECTED NOT DETECTED Final   Candida krusei NOT DETECTED NOT DETECTED Final   Candida parapsilosis NOT DETECTED NOT DETECTED Final   Candida tropicalis NOT DETECTED NOT DETECTED Final  Urine culture     Status: None   Collection Time: 08/30/16  7:26 PM  Result Value Ref Range Status   Specimen Description URINE, RANDOM  Final   Special Requests NONE  Final   Culture NO GROWTH  Final   Report Status 09/01/2016 FINAL  Final  Culture, blood (routine x 2)     Status: Abnormal   Collection Time: 09/01/16  3:22 AM  Result Value Ref Range Status   Specimen Description BLOOD LEFT ARM  Final   Special Requests IN PEDIATRIC BOTTLE [facility]  Final   Culture  Setup Time   Final  GRAM POSITIVE COCCI IN CLUSTERS IN PEDIATRIC BOTTLE CRITICAL RESULT CALLED TO, READ BACK BY AND VERIFIED WITH: TO VBYRK(PHARD) BY TCLEVELAND 09/02/2016 AT 00:44 AM    Culture (A)  Final    STAPHYLOCOCCUS AUREUS SUSCEPTIBILITIES PERFORMED ON PREVIOUS CULTURE WITHIN THE LAST 5 DAYS.    Report Status 09/02/2016 FINAL  Final  Blood Culture ID Panel (Reflexed)     Status: Abnormal   Collection Time: 09/01/16  3:22 AM  Result Value Ref Range Status   Enterococcus species NOT DETECTED NOT DETECTED Final   Listeria monocytogenes  NOT DETECTED NOT DETECTED Final   Staphylococcus species DETECTED (A) NOT DETECTED Final    Comment: CRITICAL RESULT CALLED TO, READ BACK BY AND VERIFIED WITH: TO VBYRK(PHARMD) BY TCLEVELAND 09/02/2016 AT 12:44AM    Staphylococcus aureus DETECTED (A) NOT DETECTED Final    Comment: CRITICAL RESULT CALLED TO, READ BACK BY AND VERIFIED WITH: TO VBYRK(PHARMD) BY TCLEVELAND 09/02/2016 AT 12:44AM    Methicillin resistance DETECTED (A) NOT DETECTED Final    Comment: CRITICAL RESULT CALLED TO, READ BACK BY AND VERIFIED WITH: TO VBYRK(PHARMD) BY TCLEVELAND 09/02/2016 AT 12:44AM    Streptococcus species NOT DETECTED NOT DETECTED Final   Streptococcus agalactiae NOT DETECTED NOT DETECTED Final   Streptococcus pneumoniae NOT DETECTED NOT DETECTED Final   Streptococcus pyogenes NOT DETECTED NOT DETECTED Final   Acinetobacter baumannii NOT DETECTED NOT DETECTED Final   Enterobacteriaceae species NOT DETECTED NOT DETECTED Final   Enterobacter cloacae complex NOT DETECTED NOT DETECTED Final   Escherichia coli NOT DETECTED NOT DETECTED Final   Klebsiella oxytoca NOT DETECTED NOT DETECTED Final   Klebsiella pneumoniae NOT DETECTED NOT DETECTED Final   Proteus species NOT DETECTED NOT DETECTED Final   Serratia marcescens NOT DETECTED NOT DETECTED Final   Haemophilus influenzae NOT DETECTED NOT DETECTED Final   Neisseria meningitidis NOT DETECTED NOT DETECTED Final   Pseudomonas aeruginosa NOT DETECTED NOT DETECTED Final   Candida albicans NOT DETECTED NOT DETECTED Final   Candida glabrata NOT DETECTED NOT DETECTED Final   Candida krusei NOT DETECTED NOT DETECTED Final   Candida parapsilosis NOT DETECTED NOT DETECTED Final   Candida tropicalis NOT DETECTED NOT DETECTED Final  Culture, blood (routine x 2)     Status: None   Collection Time: 09/01/16  6:24 AM  Result Value Ref Range Status   Specimen Description BLOOD BLOOD RIGHT HAND  Final   Special Requests BOTTLES DRAWN AEROBIC AND ANAEROBIC 5CC   Final   Culture NO GROWTH 5 DAYS  Final   Report Status 09/06/2016 FINAL  Final  Culture, blood (routine x 2)     Status: None (Preliminary result)   Collection Time: 09/04/16  4:41 AM  Result Value Ref Range Status   Specimen Description BLOOD LEFT HAND  Final   Special Requests BOTTLES DRAWN AEROBIC AND ANAEROBIC 5CC  Final   Culture NO GROWTH 3 DAYS  Final   Report Status PENDING  Incomplete  Culture, blood (routine x 2)     Status: None (Preliminary result)   Collection Time: 09/04/16  4:46 AM  Result Value Ref Range Status   Specimen Description BLOOD LEFT HAND  Final   Special Requests IN PEDIATRIC BOTTLE 4CC  Final   Culture NO GROWTH 3 DAYS  Final   Report Status PENDING  Incomplete    Radiology Studies: No results found.  Scheduled Meds: . amLODipine  10 mg Oral Daily  . aspirin  325 mg Oral Daily  . atorvastatin  40 mg Oral q1800  . enoxaparin (LOVENOX) injection  40 mg Subcutaneous Q24H  . feeding supplement (ENSURE ENLIVE)  237 mL Oral Q24H  . feeding supplement (GLUCERNA SHAKE)  237 mL Oral BID PC  . insulin aspart  0-9 Units Subcutaneous TID WC  . insulin aspart  3 Units Subcutaneous TID WC  . insulin glargine  18 Units Subcutaneous QHS  . mouth rinse  15 mL Mouth Rinse BID  . metoprolol succinate  100 mg Oral Daily  . multivitamin with minerals  1 tablet Oral Daily  . mupirocin cream   Topical Daily  . thiamine  100 mg Oral Daily   Continuous Infusions:   LOS: 9 days   Time spent: 20 minutes   Faye Ramsay, MD [facility] Pager [phone]  If 7PM-7AM, please contact night-coverage [email or web address] Password [email or web address] 09/08/2016, 10:20 AM

## 2016-09-08 NOTE — Progress Notes (Signed)
Notified MD Yates in regards to patient's right elbow earlier on in shift. Told MD that patient elbow having puss coming from site. Site has purulent discharge. Also notified MD Reesa Chew, MD Burns in regards to patient's infected elbow. Patient will be taken to CT.

## 2016-09-08 NOTE — Significant Event (Signed)
Rapid Response Event Note  Overview: Time Called: 0945 Arrival Time: 0945 Event Type: Respiratory  Initial Focused Assessment: Patient on RA desat to 77%, RN placed patient on 5L [place] O2 sats improved to 86%. Upon my arrival patient resting on side in bed, Fully alert not willing to cough or deep breath.  Lung sounds with rhonchi through out.   Interventions: Placed on 100% NRB  O2 sats improved to 99% PCXR ordered and done Weaned O2 to 55% Venturi  O2 sats decreased to 85%  Placed back on 100% NRB  O2 sats 97%  CCM assessed patient. 1330 Patient transferred to [place] (if not transferred):  Event Summary: Name of Physician Notified: Doyle Askew at [place]    at    Outcome: Transferred (Comment)  Event End Time: [place]  Raliegh Ip

## 2016-09-08 NOTE — Progress Notes (Signed)
OT Cancellation Note  Patient Details Name: Andrew [name] MRN: [id number] DOB: 03-22-1946   Cancelled Treatment:     Reason treatment not complete/Pt not seen: Pt refused OT today despite maximum verbal encouragement and reasoning for therapy. Pt also declined assistance for bed mobility, grooming and/or reapplying venturi mask (which he had removed as OT was entering the room). Pt eventually allowed OT to reapply mask and then motioned for OT to leave room/"go away". RN is aware of pt declining therapy session today. Will attempt again as able for acute OT services.   Carlynn Herald Nathanyl Andujo Beth Dixon, OTR/L 09/08/2016, 11:28 AM

## 2016-09-08 NOTE — Progress Notes (Signed)
Family member, Ms. Wynonia Lawman, returned call, and I notified her that pt has been moved to 2M01.

## 2016-09-08 NOTE — Progress Notes (Signed)
OT Cancellation Note  Patient Details Name: Andrew [name] MRN: [id number] DOB: 20-Jul-1946   Cancelled Treatment:    Reason Eval/Treat Not Completed: Patient not medically ready. Pt getting ready to be moved to ICU per RN due to respiratory issues.  Almon Register [id number] 09/08/2016, 1:09 PM

## 2016-09-08 NOTE — Progress Notes (Signed)
0930 - SN informs me that pt's O2 sats on RA are 77%. BP 127/60, HR 84. 0935 - placed pt on 5L [place], satting at 86%. [id number] - called RRT; Festus Holts arrived.  Pt anxious, difficult in getting pt to keep still so that we can get vitals. 0954 -Dr. Doyle Askew arrived in room, ordered portable chest x-ray; new order for cardiac monitoring. [id number] - placed pt on non-rebreather at 15L, satting at 94%.   1011 = BP 141/58, HR 88, Venturi mask at 55%, 15L, satting at 100%. Difficulty in getting the pt to keep the venturi mask on.  Dr. Doyle Askew stated that she would ask the critical team to come assess pt, and that she would write orders for stepdown, however, there on no stepdown beds available, so pt remains on [facility].

## 2016-09-08 NOTE — Progress Notes (Signed)
Per Attending MD patient is not medically stable for discharge at this time; patient is refusing SNF placement and desires to go home at discharge; CM talked to his daughter via phone Lou Cal [phone]); [place] choice offered, she chose [facility]; CM will continue to assess his DCP / also for home IV antibiotics; Aneta Mins [phone]

## 2016-09-08 NOTE — Consult Note (Signed)
PULMONARY / CRITICAL CARE MEDICINE   Name: Andrew [name] MRN: [id number] DOB: 1946/02/18    ADMISSION DATE:  08/30/2016 CONSULTATION DATE:  09/08/2016  REFERRING MD:  [facility]  CHIEF COMPLAINT: Desaturations/ New LLL Opacity suspicious for HAP 09/08/2016  HISTORY OF PRESENT ILLNESS:   70 y.o.malewith hypertension, hyperlipidemia, diabetes mellitus, anxiety, AAA premises 4.8 cm on MRI 04/09/15), right eye blindness, dCHF, who presented to the ED on 08/30/2016 with two days duration of substernal and pressure like chest pain, SOB and right elbow pain. In the ED pt was found to have WBC 24.8, lactate of 4.85, troponin 0.07, acute renal injury with Cr 1.28, hyponatremia with sodium 129. Chest x-ray showed patchy atelectasis in the left base without infiltration. D-dimer positive, but CT angiograms negative for PE or infiltration.He was found to have septic olecranon bursitis of his right elbow .  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  Patient refused TEE so planning to treat with IV vancomycin for 6 weeks with anticipated stop date of 10/12/16 per ID. Initial troponin 0.07 --> 0.21, with suspected demand ischemia in setting of sepsis,but additional risk factors so  Cards was consulted . On  09/08/2016 pt was noted to have new fever, T Max of 100, with WBC increase to 28.2., and new LLL infiltrate on CXR. He has required increased oxygen support to NRB at 15 L.  Zosyn was started per the primary team for suspected HAP ( Pt. Is PCN allergic, but has tolerated Zosyn this admission). PCCM was consulted for assistance.  PAST MEDICAL HISTORY :  He  has a past medical history of Blind right eye; Diabetes mellitus without complication ([place]); Hyperlipemia; Hypertension; and Saccular aneurysm: 4.8cm infrarenal AAA per MRI (04/09/2015) (04/12/2015).  PAST SURGICAL HISTORY: He  has no past surgical history on file.  Allergies  Allergen Reactions  . Penicillins Rash and  Other (See Comments)    Possibly rash? Has patient had a PCN reaction causing immediate rash, facial/tongue/throat swelling, SOB or lightheadedness with hypotension: YES Has patient had a PCN reaction causing severe rash involving mucus membranes or skin necrosis:NO Has patient had a PCN reaction that required hospitalization NO Has patient had a PCN reaction occurring within the last 10 years:NO If all of the above answers are "NO", then may proceed with Cephalosporin use.    No current facility-administered medications on file prior to encounter.    Current Outpatient Prescriptions on File Prior to Encounter  Medication Sig  . amLODipine (NORVASC) 10 MG tablet Take 1 tablet (10 mg total) by mouth daily.  Marland Kitchen JANUVIA 100 MG tablet Take 100 mg by mouth daily.  . metFORMIN (GLUCOPHAGE) 500 MG tablet Take 1,000 mg by mouth 2 (two) times daily with a meal.    FAMILY HISTORY:  His indicated that his mother is deceased. He indicated that his father is deceased. He indicated that both of his sisters are alive.    SOCIAL HISTORY: He  reports that he has been smoking Cigarettes.  He has been smoking about 1.50 packs per day. He has never used smokeless tobacco. He reports that he drinks about 16.8 oz of alcohol per week .  REVIEW OF SYSTEMS:   General: + fevers, chills, no changes in body weight, has fatigue HEENT: right eye blindness, No hearing changes or sore throat Respiratory: + dyspnea, coughing, white secretions per patient  CV: no chest pain, no palpitations GI: no nausea, vomiting, abdominal pain, diarrhea, constipation GU: no dysuria, burning  on urination, increased urinary frequency, hematuria  Ext: no leg edema Neuro: no unilateral weakness, numbness, or tingling, Right eye blindness no hearing loss Skin: Right elbow with dressing MSK: No muscle spasm, no deformity, no limitation of range of movement in spin Heme: No easy bruising.  Travel history: No recent long distant  travel.  SUBJECTIVE:  " I have no problems with my breathing" " I want to go home"   VITAL SIGNS: BP (!) 141/58 (BP Location: Left Arm)   Pulse 88   Temp 99 F (37.2 C) (Oral)   Resp 18   Ht 5\' 8"  (1.727 m)   Wt 156 lb 11.2 oz (71.1 kg)   SpO2 100%   BMI 23.83 kg/m   HEMODYNAMICS:    VENTILATOR SETTINGS:    INTAKE / OUTPUT: I/O last 3 completed shifts: In: 607 [P.O.:357; IV Piggyback:250] Out: 825 [Urine:825]  PHYSICAL EXAMINATION: General:  Elderly male with NRB Mask slumped in bed, uncooperative Neuro: Awake and alert, Will not answer orientation questions, but answers directed questions selectively and appropriately, but is altered HEENT: Blind in Right eye,hearing intact,  Cardiovascular: RRR, No rub, gallop or Murmur Lungs: Rhonchi throughout, diminished per bases Abdomen:  Soft and flat, BS + Musculoskeletal: No obvious deformity or injury, no obvious limit to ROM  Skin:  Dressing to left elbow, pink and warm to touch  LABS:  BMET  Recent Labs Lab 09/06/16 0317 09/07/16 0600 09/08/16 0910  NA 129* 127* 128*  K 4.8 4.5 4.5  CL 96* 94* 94*  CO2 27 26 25   BUN 17 20 22*  CREATININE 0.61 0.78 0.81  GLUCOSE 129* 63* 99    Electrolytes  Recent Labs Lab 09/04/16 0442 09/05/16 0605 09/06/16 0317 09/07/16 0600 09/08/16 0910  CALCIUM 8.3* 8.5* 8.0* 8.1* 8.0*  MG 1.8 1.6* 2.0  --   --     CBC  Recent Labs Lab 09/06/16 0317 09/07/16 0600 09/08/16 0910  WBC 16.6* 21.3* 28.2*  HGB 12.9* 12.9* 12.4*  HCT 37.9* 37.3* 36.1*  PLT 220 268 330    Coag's No results for input(s): APTT, INR in the last 168 hours.  Sepsis Markers No results for input(s): LATICACIDVEN, PROCALCITON, O2SATVEN in the last 168 hours.  ABG No results for input(s): PHART, PCO2ART, PO2ART in the last 168 hours.  Liver Enzymes No results for input(s): AST, ALT, ALKPHOS, BILITOT, ALBUMIN in the last 168 hours.  Cardiac Enzymes No results for input(s): TROPONINI,  PROBNP in the last 168 hours.  Glucose  Recent Labs Lab 09/07/16 1008 09/07/16 1133 09/07/16 1623 09/07/16 2111 09/08/16 0628 09/08/16 0728  GLUCAP 119* 127* 77 102* 60* 105*    Imaging Dg Chest Port 1 View  Result Date: 09/08/2016 CLINICAL DATA:  Acute respiratory distress EXAM: PORTABLE CHEST 1 VIEW COMPARISON:  08/30/2016 FINDINGS: Consolidation noted in the left lower lobe concerning for pneumonia. No confluent opacity on the right. Heart is borderline in size. No visible effusion or acute bony abnormality. IMPRESSION: Left lower lobe opacity concerning for pneumonia. Electronically Signed   By: Rolm Baptise M.D.   On: 09/08/2016 10:19     STUDIES:  CTA of chest:>> 08/31/2016 1. No CT evidence for acute pulmonary embolus. 2. No acute infiltrates. Mild emphysematous disease. 3. 3 mm right upper lobe pulmonary nodule.  4. Left adrenal gland enlarged with ? nodules. Follow-up CT with adrenal protocol for further evaluation.  Echo >> 08/31/2016 LVH EF 0000000 Grade 1 diastolic dysfunction Mild AS Trivial pericardial effusion  Mitral Valve with mildly thickened leaflets, no significant regurg    CULTURES: 1/14 Blood >> MRSA 1/16 Blood >> Left Arm>> Staph Aureus 1/23>> Sputum>>  ANTIBIOTICS:  Zosyn 1/14-1/15  Vanc 1/15>> Zosyn 1/23>>  SIGNIFICANT EVENTS: 09/08/2016 New LLL opacity suspicious for HAP  LINES/TUBES: PIV  DISCUSSION: Pt. With MRSA bacteremia with suspected on vanc since 08/31/16 with new LLL opacity and desaturations requiring escalation from RA to 100% NRB. Risk for intubation is high. High suspicion for silent aspiration. Pt will be transferred to ICU for closer monitoring and intubation if necessary.  ASSESSMENT / PLAN:  PULMONARY A: Acute Hypoxic Respiratory Failure secondary to HCAP And  MRSA Bacteremia with suspected MV vegetation Desaturations into the 70's on RA High Risk for intubation P:   ABG Now Transfer to ICU NRB  Wean oxygen  forSaturation Goals of > 94% Sputum Culture CXR 1/24 and daily as needed Add Zosyn per primary team/ See ID Section Mobilize as able  IS as able Mucinex for  Bed Chest PT Q 4  NPO in case need for intubation Speech Consult for high suspicion of aspiration No sedation   CARDIOVASCULAR A:  Chronic Diastolic CHF EF 123456 % P:  Goal MAP > 65 Monitor volume status closely Strick I&O Continue metoprolol and ASA Tele monitoring  RENAL A:   AKI Hyponatremia P:   I&O Limit Free water Urine for sodium, creatinine, osmolality Serum osmolality BMET daily, trend creatinine Replete lytes as needed Avoid nephrotoxic drugs   GASTROINTESTINAL A:   No Acute Issues P:   SUP: Pepcid 20 mg BID  HEMATOLOGIC A:   Leukocytosis No obvious signs of bleeding P:  Trend CBC daily Transfuse for HGB> 7.0    INFECTIOUS A:   MRSA Bacteremia New LLL Opacity P:   Continue Vanc per ID Continue Zosyn per Primary Team Sputum Culture now Reculture for temp > 101.5  ENDOCRINE A:   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 Ashok Cordia spoke with daughter Jeanett Schlein  by phone and updated her about Mr. Denzel condition.Discussed the possibility of intubation and asked if there had ever been discussions regarding desire for life support should it be necessary. - Inter-disciplinary family meet or Palliative Care meeting due by: 09/15/16.  Magdalen Spatz, AGACNP-BC Pulmonary and [place] Pager: [phone]  09/08/2016, 11:31 AM

## 2016-09-08 NOTE — Progress Notes (Signed)
Pt took himself off of venturi mask. SN placed back on pt, pt got agitated. Pt keeps removing mask, but was placed back on before SN left room.

## 2016-09-08 NOTE — Consult Note (Signed)
WOC consulted for right elbow, reviewed chart.  Advised bedside nurse to contact Dr. Lorin Mercy about acute changes in the elbow and arm. CCM aware of contact for Dr. Lorin Mercy as well.  Marica Otter MSN, RN, CWOCN, CNS

## 2016-09-08 NOTE — Progress Notes (Signed)
CBG 161 

## 2016-09-08 NOTE — Progress Notes (Signed)
Hypoglycemic Event  CBG: 60  Treatment: Apple Juice  Symptoms: Patient reports no symptoms   Follow-up CBG: Time: 0715 CBG Result: 105  Possible Reasons for Event: Patient has been eating poorly  Comments/MD notified: Protocol    Andrew [name] Andrew [name]

## 2016-09-08 NOTE — Progress Notes (Signed)
Inpatient Diabetes Program Recommendations  AACE/ADA: New Consensus Statement on Inpatient Glycemic Control (2015)  Target Ranges:  Prepandial:   less than 140 mg/dL      Peak postprandial:   less than 180 mg/dL (1-2 hours)      Critically ill patients:  140 - 180 mg/dL   Lab Results  Component Value Date   GLUCAP 105 (H) 09/08/2016   HGBA1C >15.5 (H) 08/31/2016    Review of Glycemic Control Results for Andrew [name], Andrew [name] (MRN [id number]) as of 09/08/2016 09:44  Ref. Range 09/07/2016 11:33 09/07/2016 16:23 09/07/2016 21:11 09/08/2016 06:28 09/08/2016 07:28  Glucose-Capillary Latest Ref Range: 65 - 99 mg/dL 127 (H) 77 102 (H) 60 (L) 105 (H)   Inpatient Diabetes Program Recommendations:  Reviewed CBGs and received reduced dose of Lantus 10 units last hs. May need further decrease in Lantus to 10 units and adjust as needed.  Thank you, Nani Gasser. Breylen Agyeman, RN, MSN, CDE Inpatient Glycemic Control Team Team Pager [phone] (8am-5pm) 09/08/2016 9:46 AM

## 2016-09-08 NOTE — Progress Notes (Signed)
[place] pt for [facility] this admission  [facility] will follow for [facility] and [facility] needs upon DC if home is the desired and agreed upon DC location for pt based on the physician and family decision in partnership with [facility].    [facility] hospital team will follow while inpatient.  If patient discharges after hours, please call [phone].   Larry Sierras 09/08/2016, 3:17 PM

## 2016-09-08 NOTE — Progress Notes (Signed)
Pharmacy Antibiotic Note  Andrew [name] is a 71 y.o. 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  Patient refused TEE so planning to treat with IV vancomycin for 6 weeks with anticipated stop date of 10/12/16 per ID.    Vancomycin dose was given late last night again, so the level obtained this AM was drawn ~9.5 hours after the last dose on a Q12H regimen. Patient's renal function has worsened, Tmax is 100 and WBC has increased to 28.2.  Pharmacy is consulted to start zosyn for HCAP. Patient has penicillin allergy but tolerated zosyn during this admission. Vancomcyin random level 25 remains elevated  (goal trough 15-20)  Plan: Zosyn 3.375g IV q8h Hold vancomycin Check  vancomycin random level - in am   Monitor renal fxn, clinical progress    Height: 5\' 8"  (172.7 cm) Weight: 156 lb 11.2 oz (71.1 kg) IBW/kg (Calculated) : 68.4  Temp (24hrs), Avg:98.8 F (37.1 C), Min:98.5 F (36.9 C), Max:99 F (37.2 C)   Recent Labs Lab 09/03/16 0445 09/04/16 0442 09/04/16 0716 09/05/16 0605 09/06/16 0317 09/07/16 0600 09/08/16 0910 09/08/16 1148 09/08/16 1453 09/08/16 2118  WBC 15.2*  --   --  14.9* 16.6* 21.3* 28.2*  --   --   --   CREATININE 0.43* 0.55*  --  0.60* 0.61 0.78 0.81  --   --   --   LATICACIDVEN  --   --   --   --   --   --   --  1.1 0.9  --   VANCOTROUGH  --   --  24*  --   --   --  36*  --   --   --   VANCORANDOM  --   --   --   --   --   --   --   --   --  25    Estimated Creatinine Clearance: 80.9 mL/min (by C-G formula based on SCr of 0.81 mg/dL).    Allergies  Allergen Reactions  . Penicillins Rash and Other (See Comments)    Tolerated Zosyn Jan 2018 Possibly rash? Has patient had a PCN reaction causing immediate rash, facial/tongue/throat swelling, SOB or lightheadedness with hypotension: YES Has patient had a PCN reaction causing severe rash involving mucus membranes or skin necrosis:NO Has patient had a PCN reaction  that required hospitalization NO Has patient had a PCN reaction occurring within the last 10 years:NO If all of the above answers are "NO", then may proceed with Cephalosporin use.    Antimicrobials this admission:  Vanc 1/14 >> (2/26) Zosyn 1/14 >> 1/15, 1/23>>  Dose adjustments this admission:  1/17 VT = 10 mcg/mL on 750mg  q12 > 1250mg  q12 (SCr 0.53) 1/19 VT = 24 mcg/mL on 1250mg  q12 (drawn ~8 hrs post last dose, true trough ~18 mcg/mL), no change for now 1/23 VT = 36 mcg/mL on 1250mg  q12h (drawn early)   Microbiology results:  1/14 BCx x2 - MRSA (BCID MRSA) 1/14 UCx - negative 1/16 BCx x2 - Staph aureus 1 of 2 (BCID MRSA) 1/19 BCx x2 - ngtd   Bonnita Nasuti Pharm.D. CPP, BCPS Clinical Pharmacist [phone] 09/08/2016 10:24 PM

## 2016-09-08 NOTE — Progress Notes (Signed)
CRITICAL VALUE ALERT  Critical value received:  Vanc trough 36   Date of notification:  1.23.18   Time of notification:  [id number]  Critical value read back:  yes  Nurse who received alert:  Glean Hess   MD notified (1st page):  Dr. Doyle Askew  Time of first page:  1014  MD notified (2nd page):  Time of second page:  Responding MD:    Time MD responded:

## 2016-09-08 NOTE — Progress Notes (Signed)
Pt refused lab draws this AM; MD notified.

## 2016-09-08 NOTE — Progress Notes (Signed)
Pt transferred to ICU 2M01, as he was having difficulty with breathing.  Critical care team wrote the orders.   I have attempted to contact daughter, Ms. Wynonia Lawman, to notify her of pt moving to ICU, and had to leave a message.

## 2016-09-08 NOTE — Progress Notes (Addendum)
Pharmacy Antibiotic Note  Andrew [name] is a 71 y.o. 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  Patient refused TEE so planning to treat with IV vancomycin for 6 weeks with anticipated stop date of 10/12/16 per ID.    Vancomycin dose was given late last night again, so the level obtained this AM was drawn ~9.5 hours after the last dose on a Q12H regimen. Patient's renal function has worsened, Tmax is 100 and WBC has increased to 28.2.  Pharmacy is consulted to start zosyn for HCAP. Patient has penicillin allergy but tolerated zosyn during this admission.  Plan: Zosyn 3.375g IV q8h Hold vancomycin Check 12h vancomycin random level Monitor renal fxn, clinical progress    Height: 5\' 8"  (172.7 cm) Weight: 156 lb 11.2 oz (71.1 kg) IBW/kg (Calculated) : 68.4  Temp (24hrs), Avg:99.3 F (37.4 C), Min:98.9 F (37.2 C), Max:100 F (37.8 C)   Recent Labs Lab 09/03/16 0445 09/04/16 0442 09/04/16 0716 09/05/16 0605 09/06/16 0317 09/07/16 0600 09/08/16 0910  WBC 15.2*  --   --  14.9* 16.6* 21.3* 28.2*  CREATININE 0.43* 0.55*  --  0.60* 0.61 0.78 0.81  VANCOTROUGH  --   --  24*  --   --   --  36*    Estimated Creatinine Clearance: 80.9 mL/min (by C-G formula based on SCr of 0.81 mg/dL).    Allergies  Allergen Reactions  . Penicillins Rash and Other (See Comments)    Possibly rash? Has patient had a PCN reaction causing immediate rash, facial/tongue/throat swelling, SOB or lightheadedness with hypotension: YES Has patient had a PCN reaction causing severe rash involving mucus membranes or skin necrosis:NO Has patient had a PCN reaction that required hospitalization NO Has patient had a PCN reaction occurring within the last 10 years:NO If all of the above answers are "NO", then may proceed with Cephalosporin use.    Antimicrobials this admission:  Vanc 1/14 >> (2/26) Zosyn 1/14 >> 1/15, 1/23>>  Dose adjustments this admission:  1/17  VT = 10 mcg/mL on 750mg  q12 > 1250mg  q12 (SCr 0.53) 1/19 VT = 24 mcg/mL on 1250mg  q12 (drawn ~8 hrs post last dose, true trough ~18 mcg/mL), no change for now 1/23 VT = 36 mcg/mL on 1250mg  q12h (drawn early)   Microbiology results:  1/14 BCx x2 - MRSA (BCID MRSA) 1/14 UCx - negative 1/16 BCx x2 - Staph aureus 1 of 2 (BCID MRSA) 1/19 BCx x2 - ngtd   [name]. Diona Foley, PharmD, BCPS Clinical Pharmacist [phone] 09/08/2016, 10:18 AM

## 2016-09-08 NOTE — Progress Notes (Signed)
Pt refusing head to toe assessment at this time. Pt stated "I want you to leave now" and "I don't want to deal with this mess." Pt was told SN will be back in later to attempt head to toe assessment when pt is more awake.

## 2016-09-08 NOTE — Clinical Social Work Note (Signed)
Patient continues to refuse SNF and wants to return home when discharged. Patient's daughter is agreeable to this as long as the doctor is okay with it. Patient's son lives with him and works whenever there is a job for him. Whenever the patient's son is working, the patient's neighbors assist him.  RNCM notified and will speak with patient's daughter about setting up home health.  CSW signing off. Consult again if any social work needs arise.  Dayton Scrape, [place]

## 2016-09-08 NOTE — Progress Notes (Signed)
CRITICAL VALUE ALERT  Critical value received:  MRSA screen positive   Date of notification:  09/08/16  Time of notification:  [id number]  Critical value read back:Yes.    Nurse who received alert:  Barbaraann Share, RN  MD notified (1st page):  Dr Reesa Chew  Time of first page:  1748 in unit  MD notified (2nd page):  Time of second page:  Responding MD:  Dr Reesa Chew  Time MD responded:  In unit at [facility]

## 2016-09-09 ENCOUNTER — Inpatient Hospital Stay (HOSPITAL_COMMUNITY): Payer: Medicare Other

## 2016-09-09 LAB — GLUCOSE, CAPILLARY
GLUCOSE-CAPILLARY: 137 mg/dL — AB (ref 65–99)
GLUCOSE-CAPILLARY: 118 mg/dL — AB (ref 65–99)
GLUCOSE-CAPILLARY: 233 mg/dL — AB (ref 65–99)
Glucose-Capillary: 107 mg/dL — ABNORMAL HIGH (ref 65–99)
Glucose-Capillary: 113 mg/dL — ABNORMAL HIGH (ref 65–99)
Glucose-Capillary: 149 mg/dL — ABNORMAL HIGH (ref 65–99)

## 2016-09-09 LAB — BASIC METABOLIC PANEL
ANION GAP: 11 (ref 5–15)
BUN: 23 mg/dL — ABNORMAL HIGH (ref 6–20)
CALCIUM: 7.9 mg/dL — AB (ref 8.9–10.3)
CO2: 24 mmol/L (ref 22–32)
CREATININE: 0.75 mg/dL (ref 0.61–1.24)
Chloride: 95 mmol/L — ABNORMAL LOW (ref 101–111)
GLUCOSE: 116 mg/dL — AB (ref 65–99)
Potassium: 4.1 mmol/L (ref 3.5–5.1)
Sodium: 130 mmol/L — ABNORMAL LOW (ref 135–145)

## 2016-09-09 LAB — CULTURE, BLOOD (ROUTINE X 2)
CULTURE: NO GROWTH
CULTURE: NO GROWTH

## 2016-09-09 LAB — VANCOMYCIN, RANDOM: Vancomycin Rm: 21

## 2016-09-09 MED ORDER — IPRATROPIUM-ALBUTEROL 0.5-2.5 (3) MG/3ML IN SOLN
3.0000 mL | Freq: Four times a day (QID) | RESPIRATORY_TRACT | Status: DC
  Filled 2016-09-09 (×2): qty 3

## 2016-09-09 MED ORDER — VANCOMYCIN HCL IN DEXTROSE 1-5 GM/200ML-% IV SOLN
1000.0000 mg | INTRAVENOUS | Status: DC
  Administered 2016-09-09 – 2016-09-13 (×4): 1000 mg via INTRAVENOUS
  Filled 2016-09-09 (×6): qty 200

## 2016-09-09 MED ORDER — METOPROLOL TARTRATE 5 MG/5ML IV SOLN: 5.0000 mg | INTRAVENOUS | Status: DC | PRN

## 2016-09-09 MED ORDER — IOPAMIDOL (ISOVUE-300) INJECTION 61%
INTRAVENOUS | Status: AC
  Administered 2016-09-09: 100 mL via INTRAVENOUS
  Filled 2016-09-09: qty 100

## 2016-09-09 NOTE — Progress Notes (Signed)
SLP Cancellation Note  Patient Details Name: Andrew [name] MRN: [id number] DOB: 1946-04-23   Cancelled MBS:    Pt agreed to [facility], but when transporters arrived he refused.  This clinician and RN encouraged participation, but pt was adamant in his refusal.  He is at some risk for aspiration.  Will leave decision regarding PO status to medical team and pt's family.  SLP will follow for goals.       [name] 09/09/2016, 11:01 AM

## 2016-09-09 NOTE — Progress Notes (Signed)
Pharmacy Antibiotic Note  Andrew [name] is a 71 y.o. 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  Patient refused TEE so planning to treat with IV vancomycin for 6 weeks with anticipated stop date of 10/12/16 per ID.  Patient also has concerns for new HCAP given worsening hypoxia. 1/23 CXR showed LLL opacity concerning for PNA. Patient has penicillin allergy but tolerated zosyn during this admission. Afebrile, WBC increased to 28.2 -1/23 PM VR = 25, 1/24 AM VR = 21, Ke = 0.029, half life ~24 hours -At 1g Q24H, estimated trough = 19.85, estimated peak 35.81 -Trough will be estimated ~15 at 1500 on 1/24 and can redose then   Plan: -Zosyn 3.375g IV q8h -Vancomycin 1g q24h starting at 1500 today -Monitor renal fxn, clinical progress, VT at steady state    Height: 5\' 8"  (172.7 cm) Weight: 156 lb 11.2 oz (71.1 kg) IBW/kg (Calculated) : 68.4  Temp (24hrs), Avg:98.7 F (37.1 C), Min:98.5 F (36.9 C), Max:99 F (37.2 C)   Recent Labs Lab 09/03/16 0445  09/04/16 0716 09/05/16 0605 09/06/16 0317 09/07/16 0600 09/08/16 0910 09/08/16 1148 09/08/16 1453 09/08/16 2118 09/09/16 0307  WBC 15.2*  --   --  14.9* 16.6* 21.3* 28.2*  --   --   --   --   CREATININE 0.43*  < >  --  0.60* 0.61 0.78 0.81  --   --   --  0.75  LATICACIDVEN  --   --   --   --   --   --   --  1.1 0.9  --   --   VANCOTROUGH  --   --  24*  --   --   --  36*  --   --   --   --   VANCORANDOM  --   --   --   --   --   --   --   --   --  25 21  < > = values in this interval not displayed.  Estimated Creatinine Clearance: 81.9 mL/min (by C-G formula based on SCr of 0.75 mg/dL).    Allergies  Allergen Reactions  . Penicillins Rash and Other (See Comments)    Tolerated Zosyn Jan 2018 Possibly rash? Has patient had a PCN reaction causing immediate rash, facial/tongue/throat swelling, SOB or lightheadedness with hypotension: YES Has patient had a PCN reaction causing severe rash  involving mucus membranes or skin necrosis:NO Has patient had a PCN reaction that required hospitalization NO Has patient had a PCN reaction occurring within the last 10 years:NO If all of the above answers are "NO", then may proceed with Cephalosporin use.    Antimicrobials this admission:  Vanc 1/14 >> (2/26) Zosyn 1/14 >> 1/15, 1/23>>  Dose adjustments this admission:  1/17 VT = 10 mcg/mL on 750mg  q12 > 1250mg  q12 (SCr 0.53) 1/19 VT = 24 mcg/mL on 1250mg  q12 (drawn ~8 hrs post last dose, true trough ~18 mcg/mL), no change for now 1/23 VT = 36 but drawn early - still high though 1/23 PM VR = 25, 1/24 AM VR = 21, Ke = 0.029, half life ~24 hours = dose changed to 1g Q24H  Microbiology results:  1/14 BCx x2 - 2/2 MRSA 1/14 UCx - negative 1/16 BCx x2 - 1/2 MRSA 1/19 BCx x2 - ngtd 1/23 MRSA PCR: positive 1/23 BCx: sent  Myer Peer Grayland Ormond), PharmD  PGY1 Pharmacy Resident Pager: [phone] 09/09/2016 7:41 AM

## 2016-09-09 NOTE — Care Management Note (Addendum)
Case Management Note  Patient Details  Name: AVAAN VINK MRN: [id number] Date of Birth: 02-Dec-1945  Subjective/Objective:   Pt admitted with bactermia - later transferred to ICU for resp distress                 Action/Plan:  PTA from home with family -  PT recommended SNF however pt continues to refuse - states he is going home and is open to [facility].  Pt will need IV antibiotics post discharge with stop date late Feb 18.  [place] discussion with daughter and son scheduled today by attending NP.  Pysch consult ordered by attending to evaluate competency as pt is also refusing treatments including required oxygen (desats into high 70's on RA) and evaluations at [facility].  CM will continue to follow for discharge needs.     Expected Discharge Date:                  Expected Discharge Plan:  [place]  In-House Referral:  Clinical Social Work  Discharge planning Services  CM Consult  Post Acute Care Choice:    Choice offered to:     DME Arranged:    DME Agency:     HH Arranged:    [place] Agency:     Status of Service:  In process, will continue to follow  If discussed at Long Length of Stay Meetings, dates discussed:    Additional Comments: 09/09/2016 CM spoke with pt however orientation is in question, pt confused and trying to get out of bed with catheter.  CM spoke with daughter - daughter states that PTA pt mostly independent and lived with his son however son works away from home- son at work this am and unable to be reached.  CM informed daughter or SNF recommendation and why the recommendation was made including mobility safety, cognitivee concerns in addition to needing IV antibiotics until mid Feb - daughter stated she understood and will work on developing a 24 hour care plan for father.  CM verified with NP that both psyche consult was ordered and that a palliative discussion would be initiated post psyche eval due to pt now refusing treatments/recommendations.  Maryclare Labrador, RN 09/09/2016, 1:46 PM

## 2016-09-09 NOTE — Progress Notes (Signed)
Pt refusing to go down for MBS. Speech therapist Estill Bamberg also went in room to encourage him to go but he is still refusing.

## 2016-09-09 NOTE — Evaluation (Signed)
Clinical/Bedside Swallow Evaluation Patient Details  Name: Andrew [name] MRN: [id number] Date of Birth: 11-May-1946  Today's Date: 09/09/2016 Time: SLP Start Time (ACUTE ONLY): 0930 SLP Stop Time (ACUTE ONLY): 0945 SLP Time Calculation (min) (ACUTE ONLY): 15 min  Past Medical History:  Past Medical History:  Diagnosis Date  . Blind right eye   . Diabetes mellitus without complication ([place])   . Hyperlipemia   . Hypertension   . 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 No pertinent surgical history. HPI:  71 y.o.malewith hypertension, hyperlipidemia, diabetes mellitus, anxiety, AAA 4.8 cm on MRI (04/09/15), right eye blindness, anddCHF. Presented to the ED on 08/30/2016 with two days chest pain, SOB and right elbow pain. In the ED pt was found to have WBC 24.8, lactate of 4.85, troponin 0.07, acute renal injury with Cr 1.28, hyponatremia with sodium 129. Found to have LLL PNA and MRSA bacteremia secondary to olecranon bursitis of his right elbow withpossible mitral valve vegetation per echo. Clinical swallow eval on 1/17 with recs for regular diet, thin liquds.  On 1/23 pt was noted to have new fever, T Max of 100, with WBC increase to 28.2., and new LLL infiltrate on CXR. He has required increased oxygen support to NRB at 15 L.Pt transfered to ICU same date.  Has been refusing oxygen, further testing, as well as any discussions re: code status.  SLP reconsulted for formal swallowing evaluation due to ongoing aspiration concerns.    Assessment / Plan / Recommendation Clinical Impression  Pt with adequate oral motor functioning, failed 3 oz water test, which elicited immediate coughing.  Given concerns for potential aspiration and respiratory status, recommend proceeding with MBS for objective dx.  Pt reluctant, but agreed to participate.  Scheduled for 11 am today. D/w NP and RN.     Aspiration Risk  Mild aspiration  risk    Diet Recommendation    NPO pending MBS today      Other  Recommendations     Follow up Recommendations        Frequency and Duration            Prognosis        Swallow Study   General HPI: 71 y.o.malewith hypertension, hyperlipidemia, diabetes mellitus, anxiety, AAA 4.8 cm on MRI (04/09/15), right eye blindness, anddCHF. Presented to the ED on 08/30/2016 with two days chest pain, SOB and right elbow pain. In the ED pt was found to have WBC 24.8, lactate of 4.85, troponin 0.07, acute renal injury with Cr 1.28, hyponatremia with sodium 129. Found to have LLL PNA and MRSA bacteremia secondary to olecranon bursitis of his right elbow withpossible mitral valve vegetation per echo. Clinical swallow eval on 1/17 with recs for regular diet, thin liquds.  On 1/23 pt was noted to have new fever, T Max of 100, with WBC increase to 28.2., and new LLL infiltrate on CXR. He has required increased oxygen support to NRB at 15 L.Pt transfered to ICU same date.  Has been refusing oxygen, further testing, as well as any discussions re: code status.  SLP reconsulted for formal swallowing evaluation due to ongoing aspiration concerns.  Type of Study: Bedside Swallow Evaluation Previous Swallow Assessment:  (09/02/16 with recs for regular diet, thin liquids) Diet Prior to this Study: NPO Temperature Spikes Noted: No Respiratory Status: Room air (88%) History of Recent Intubation: No Behavior/Cognition: Alert Oral Cavity Assessment: Within Functional Limits Oral Care Completed  by SLP: No Oral Cavity - Dentition: Missing dentition;Poor condition Vision: Functional for self-feeding Self-Feeding Abilities: Able to feed self;Needs assist Patient Positioning: Partially reclined Baseline Vocal Quality: Normal Volitional Cough: Strong Volitional Swallow: Unable to elicit    Oral/Motor/Sensory Function Overall Oral Motor/Sensory Function: Within functional limits   Ice Chips Ice chips: Within  functional limits   Thin Liquid Thin Liquid: Impaired Presentation: Cup;Straw Pharyngeal  Phase Impairments: Cough - Immediate Other Comments: cough after consumption of 3 oz water    Nectar Thick Nectar Thick Liquid: Not tested   Honey Thick Honey Thick Liquid: Not tested   Puree Puree: Within functional limits   Solid   GO   Solid: Not tested        Andrew [name] 09/09/2016,10:30 AM

## 2016-09-09 NOTE — Progress Notes (Signed)
Pt noted in bed covered in feces from head to legs. He then took some feces and threw it on the floor. Very agitated when cleaning pt and he says he didn't defecate and wants [place] to leave him alone. We convinced him to allow [place] to finish bath.

## 2016-09-09 NOTE — Progress Notes (Signed)
Report given to Rey RN on 3W and pt transferred via bed, nurse, and NT.

## 2016-09-09 NOTE — Progress Notes (Signed)
PULMONARY / CRITICAL CARE MEDICINE   Name: Andrew [name] MRN: [id number] DOB: [birth year]    ADMISSION DATE:  08/30/2016 CONSULTATION DATE:  09/08/2016  REFERRING MD:  [facility]  CHIEF COMPLAINT: Desaturations/ New LLL Opacity suspicious for HAP 09/08/2016  Brief:   70 y.o.malewith hypertension, hyperlipidemia, diabetes mellitus, anxiety, AAA [place] 4.8 cm on MRI 04/09/15), right eye blindness, and dCHF.   Presented to the ED on 08/30/2016 with two days chest pain, SOB and right elbow pain. In the ED pt was found to have WBC 24.8, lactate of 4.85, troponin 0.07, acute renal injury with Cr 1.28, hyponatremia with sodium 129. Found to have LLL PNA and MRSA bacteremia secondary to olecranon bursitis of his right elbow with possible mitral valve vegetation per echo. On 1/23 pt was noted to have new fever, T Max of 100, with WBC increase to 28.2., and new LLL infiltrate on CXR. He has required increased oxygen support to NRB at 15 L.  SUBJECTIVE:  Refuses to wear [place]. Refusing medical treatment.  Maintaining saturation 88-90%.   VITAL SIGNS: BP (!) 146/68 (BP Location: Left Arm)   Pulse 72   Temp 98.1 F (36.7 C) (Oral)   Resp 17   Ht 5\' 8"  (1.727 m)   Wt 71.1 kg (156 lb 11.2 oz)   SpO2 94%   BMI 23.83 kg/m   HEMODYNAMICS:    VENTILATOR SETTINGS: FiO2 (%):  [55 %-100 %] 55 %  INTAKE / OUTPUT: I/O last 3 completed shifts: In: 400 [P.O.:120; Other:130; IV Piggyback:150] Out: 700 [Urine:700]  PHYSICAL EXAMINATION: General:  Elderly male, no distress, lying in bed  Neuro: Awake and alert, oriented x2,  follows commands HEENT: Blind in Right eye, normocephalic  Cardiovascular: RRR, No MRG, NI S1/S2  Lungs: Non-labored, rhonchi throughout, diminished per bases Abdomen:  Soft, non-tender, non-distended, active bowel sounds  Musculoskeletal: Right arm red, tender, unable to mobilize  Skin:  Dressing to left elbow  LABS:  BMET  Recent Labs Lab 09/07/16 0600 09/08/16 0910  09/09/16 0307  NA 127* 128* 130*  K 4.5 4.5 4.1  CL 94* 94* 95*  CO2 26 25 24   BUN 20 22* 23*  CREATININE 0.78 0.81 0.75  GLUCOSE 63* 99 116*    Electrolytes  Recent Labs Lab 09/04/16 0442 09/05/16 0605 09/06/16 0317 09/07/16 0600 09/08/16 0910 09/09/16 0307  CALCIUM 8.3* 8.5* 8.0* 8.1* 8.0* 7.9*  MG 1.8 1.6* 2.0  --   --   --     CBC  Recent Labs Lab 09/06/16 0317 09/07/16 0600 09/08/16 0910  WBC 16.6* 21.3* 28.2*  HGB 12.9* 12.9* 12.4*  HCT 37.9* 37.3* 36.1*  PLT 220 268 330    Coag's No results for input(s): APTT, INR in the last 168 hours.  Sepsis Markers  Recent Labs Lab 09/08/16 1148 09/08/16 1453  LATICACIDVEN 1.1 0.9  PROCALCITON 0.22  --     ABG  Recent Labs Lab 09/08/16 1403  PHART 7.472*  PCO2ART 36.4  PO2ART 61.0*    Liver Enzymes No results for input(s): AST, ALT, ALKPHOS, BILITOT, ALBUMIN in the last 168 hours.  Cardiac Enzymes No results for input(s): TROPONINI, PROBNP in the last 168 hours.  Glucose  Recent Labs Lab 09/08/16 1144 09/08/16 1427 09/08/16 2044 09/09/16 0045 09/09/16 0353 09/09/16 0832  GLUCAP 161* 133* 103* 113* 107* 137*    Imaging Ct [place] Right W Contrast  Result Date: 09/09/2016 CLINICAL DATA:  Pus coming out of right elbow. EXAM: CT OF THE UPPER  RIGHT EXTREMITY WITH CONTRAST TECHNIQUE: Multidetector CT imaging of the upper right extremity was performed according to the standard protocol following intravenous contrast administration. COMPARISON:  08/30/2016 elbow radiographs CONTRAST:  18mL ISOVUE-300 IOPAMIDOL (ISOVUE-300) INJECTION 61%<Contrast>127mL ISOVUE-300 IOPAMIDOL (ISOVUE-300) INJECTION 61% FINDINGS: Bones/Joint/Cartilage Spurring at the triceps insertion on the olecranon. No fracture bone destruction. Joint spaces are maintained about the elbow. No joint effusion. Ligaments Suboptimally assessed by CT. Muscles and Tendons No pyomyositis. Soft tissues Diffuse subcutaneous edema and soft  tissue induration without drainable abscess. Tiny foci of subcutaneous emphysema along the dorsum of the proximal forearm and distal arm. No deep or superficial tracking of gas to suggest necrotizing fasciitis though this is a clinical diagnosis despite imaging. Based on current imaging findings, the findings are more likely related to extensive cellulitis. IMPRESSION: Diffuse soft tissue induration and edema consistent with cellulitis. Small foci of subcutaneous emphysema are noted likely secondary to reoprted drainage of fluid. No drainable abscess is currently identified. No findings of osteomyelitis. Electronically Signed   By: Ashley Royalty M.D.   On: 09/09/2016 02:11   Dg Chest Port 1 View  Result Date: 09/08/2016 CLINICAL DATA:  Acute respiratory distress EXAM: PORTABLE CHEST 1 VIEW COMPARISON:  08/30/2016 FINDINGS: Consolidation noted in the left lower lobe concerning for pneumonia. No confluent opacity on the right. Heart is borderline in size. No visible effusion or acute bony abnormality. IMPRESSION: Left lower lobe opacity concerning for pneumonia. Electronically Signed   By: Rolm Baptise M.D.   On: 09/08/2016 10:19   STUDIES:  CTA of chest: > 08/31/2016 > Neg acute,Mild emphysematous disease, 3 mm right upper lobe pulmonary nodule.  Echo > 08/31/2016 LVH EF 0000000, Grade 1 diastolic dysfunction, Mild AS, Trivial pericardial effusion, Mitral Valve with mildly thickened leaflets, no significant regurg  CULTURES: Blood 1/14 > MRSA Urine 1/14 > Neg Blood 1/16 > 1/2 MRSA Blood 1/19 > ngtd MRSA PCR 1/23 > Positive  Sputum 1/23 >>  Blood 1/23 >>  ANTIBIOTICS: Zosyn 1/14-1/15 Vanc 1/15>> Zosyn 1/23>>  SIGNIFICANT EVENTS: 1/14 > ED with HCAP and MRSA Bacteremia  1/23 > New LLL opacity suspicious for HAP > high risk aspiration > transferred to ICU   LINES/TUBES: PIV  ASSESSMENT / PLAN:  PULMONARY A: Acute Hypoxic Respiratory Failure secondary to HCAP P:   Wean oxygen for  Saturation Goals of > 92% Follow CXR  Mobilize, IS  Mucinex, albuterol PRN  Scheduled duonebs  Bed Chest PT Q 4   CARDIOVASCULAR A:  Chronic Diastolic CHF - EF 123456 % -patient refused TEE  P:  Monitor volume status closely Strick I&O Continue metoprolol, Norvasc, ASA Tele monitoring  RENAL A:   Hyponatremia - improving  P:   I&O Limit Free water BMET daily Replete lytes as needed Avoid nephrotoxic drugs   GASTROINTESTINAL A:   Dysphagia - Suspected Aspiration  P:   SUP: Pepcid 20 mg BID TF  Will need formal Speech Evaluation   HEMATOLOGIC A:   Leukocytosis No obvious signs of bleeding P:  Trend CBC daily Transfuse for HGB> 7.0  INFECTIOUS A:   MRSA Bacteremia > Suspected MV vegetation New LLL Opacity P:   Continue Zosyn IV Vanc for total 6 weeks (stop date 2/26)  Follow Culture Data Trend Fever and WBC curve   ENDOCRINE A:   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 Patient continues to refuse tests. Will discuss goals of care with daughter. Could transfer to step-down.  - Inter-disciplinary family meet or Palliative Care meeting due by: 09/15/16.  Hayden Pedro, AG-ACNP [facility]  Pgr: [phone]  PCCM Pgr: [phone]

## 2016-09-09 NOTE — Progress Notes (Signed)
While returning from CT Scan pt refused to wear Venturi Mask.  Pt educated on the importance of wearing his oxygen and the side effects of refusing.  MD and Elink notified.

## 2016-09-09 NOTE — Progress Notes (Signed)
OT Cancellation Note  Patient Details Name: Andrew [name] MRN: [id number] DOB: [birth year]   Cancelled Treatment:    Reason Eval/Treat Not Completed: Patient declined, no reason specified. Will follow up as time allows.  Binnie Kand M.S., OTR/L Pager: [phone]  09/09/2016, 2:25 PM

## 2016-09-09 NOTE — Progress Notes (Signed)
Called to patient's room given hypoxia and patient refusing non-rebreather and nasal cannula. Patient states that the oxygen hurts his nose and he doesn't want to talk anymore about it. I discussed his low oxygen levels with him and my concern if they stayed low. He again voiced that he didn't care, he was a grown man, and he wasn't going to talk about it anymore. Patient denies being in any pain, he denies shortness of breath or nausea. He asks for a Diet Coke.   We discussed concerns given his hypoxia and that serious complications could result including cardiac arrest. He stated that he didn't care and that his wife has already passed away. Given this statement, I tried to address code status with him including intubation. He refused to discussed this. He stated he didn't have a preference, thus leaving him Full Code. He stated he wanted to go home.   The above was discussed with Dr. Elsworth Soho. For now, we will leave patient off NRB and [place] and monitor. We will continue to gently encourage him to wear his oxygen later on.   Martyn Malay, DO PGY-3 Internal Medicine Resident Pager # [phone] 09/09/2016 2:02 AM

## 2016-09-10 ENCOUNTER — Inpatient Hospital Stay (HOSPITAL_COMMUNITY): Payer: Medicare Other

## 2016-09-10 DIAGNOSIS — Z79899 Other long term (current) drug therapy: Secondary | ICD-10-CM

## 2016-09-10 DIAGNOSIS — L8990 Pressure ulcer of unspecified site, unspecified stage: Secondary | ICD-10-CM

## 2016-09-10 DIAGNOSIS — N179 Acute kidney failure, unspecified: Secondary | ICD-10-CM

## 2016-09-10 DIAGNOSIS — R749 Abnormal serum enzyme level, unspecified: Secondary | ICD-10-CM

## 2016-09-10 DIAGNOSIS — I10 Essential (primary) hypertension: Secondary | ICD-10-CM

## 2016-09-10 DIAGNOSIS — A419 Sepsis, unspecified organism: Secondary | ICD-10-CM

## 2016-09-10 DIAGNOSIS — Z794 Long term (current) use of insulin: Secondary | ICD-10-CM

## 2016-09-10 LAB — GLUCOSE, CAPILLARY
GLUCOSE-CAPILLARY: 322 mg/dL — AB (ref 65–99)
GLUCOSE-CAPILLARY: 266 mg/dL — AB (ref 65–99)
GLUCOSE-CAPILLARY: 199 mg/dL — AB (ref 65–99)
GLUCOSE-CAPILLARY: 137 mg/dL — AB (ref 65–99)
Glucose-Capillary: 242 mg/dL — ABNORMAL HIGH (ref 65–99)

## 2016-09-10 LAB — BASIC METABOLIC PANEL
Anion gap: 12 (ref 5–15)
BUN: 20 mg/dL (ref 6–20)
CALCIUM: 7.7 mg/dL — AB (ref 8.9–10.3)
CHLORIDE: 94 mmol/L — AB (ref 101–111)
CO2: 23 mmol/L (ref 22–32)
CREATININE: 0.81 mg/dL (ref 0.61–1.24)
GFR calc non Af Amer: >60 mL/min (ref >60)
Glucose, Bld: 327 mg/dL — ABNORMAL HIGH (ref 65–99)
Potassium: 4.1 mmol/L (ref 3.5–5.1)
SODIUM: 129 mmol/L — AB (ref 135–145)

## 2016-09-10 LAB — PHOSPHORUS: Phosphorus: 3.8 mg/dL (ref 2.5–4.6)

## 2016-09-10 LAB — CBC
HCT: 35.5 % — ABNORMAL LOW (ref 39.0–52.0)
Hemoglobin: 12.2 g/dL — ABNORMAL LOW (ref 13.0–17.0)
MCH: 29.2 pg (ref 26.0–34.0)
MCHC: 34.4 g/dL (ref 30.0–36.0)
MCV: 84.9 fL (ref 78.0–100.0)
PLATELETS: 441 10*3/uL — AB (ref 150–400)
RBC: 4.18 MIL/uL — AB (ref 4.22–5.81)
RDW: 14 % (ref 11.5–15.5)
WBC: 14.3 10*3/uL — ABNORMAL HIGH (ref 4.0–10.5)

## 2016-09-10 LAB — MAGNESIUM: MAGNESIUM: 1.8 mg/dL (ref 1.7–2.4)

## 2016-09-10 MED ORDER — IPRATROPIUM-ALBUTEROL 0.5-2.5 (3) MG/3ML IN SOLN
3.0000 mL | Freq: Three times a day (TID) | RESPIRATORY_TRACT | Status: DC
  Filled 2016-09-10: qty 3

## 2016-09-10 MED ORDER — IPRATROPIUM-ALBUTEROL 0.5-2.5 (3) MG/3ML IN SOLN
3.0000 mL | Freq: Four times a day (QID) | RESPIRATORY_TRACT | Status: DC
  Administered 2016-09-10: 3 mL via RESPIRATORY_TRACT
  Filled 2016-09-10 (×2): qty 3

## 2016-09-10 MED ORDER — INSULIN GLARGINE 100 UNIT/ML ~~LOC~~ SOLN
10.0000 [IU] | Freq: Every day | SUBCUTANEOUS | Status: DC
  Administered 2016-09-10 – 2016-09-13 (×4): 10 [IU] via SUBCUTANEOUS
  Filled 2016-09-10 (×4): qty 0.1

## 2016-09-10 MED ORDER — RESOURCE THICKENUP CLEAR PO POWD
ORAL | Status: DC | PRN
  Filled 2016-09-10: qty 125

## 2016-09-10 MED ORDER — NICOTINE 21 MG/24HR TD PT24
21.0000 mg | MEDICATED_PATCH | Freq: Every day | TRANSDERMAL | Status: DC
  Administered 2016-09-10 – 2016-09-14 (×5): 21 mg via TRANSDERMAL
  Filled 2016-09-10 (×6): qty 1

## 2016-09-10 NOTE — Progress Notes (Signed)
Occupational Therapy Treatment Patient Details Name: Andrew [name] MRN: [id number] DOB: 1945/09/01 Today's Date: 09/10/2016    History of present illness Pt admitted with chest pain, fever, SOB and R elbow pain. + for MRSA bacteremia and R elbow cellulits. PMH: DM, AAA, CHF, HTN, blindness in R eye, anxiety and presumed endocarditis.    OT comments  Pt incontinent of bowel upon arrival; required total assist for LB bathing and peri care at bed level. Pt tolerated sitting EOB ~5 minutes and required total assist to don socks. Pt able to perform sit to stand from EOB with mod assist +2 and stand pivot with min assist +2. Continue to recommend SNF for follow up. Will continue to follow acutely.   Follow Up Recommendations  SNF;Supervision/Assistance - 24 hour    Equipment Recommendations  3 in 1 bedside commode    Recommendations for Other Services      Precautions / Restrictions Precautions Precautions: Fall Restrictions Weight Bearing Restrictions: No       Mobility Bed Mobility Overal bed mobility: Needs Assistance Bed Mobility: Rolling;Supine to Sit;Sit to Supine Rolling: Min guard   Supine to sit: Mod assist Sit to supine: Min assist   General bed mobility comments: Assist for trunk elevation from supine to sit. Assist for LEs into bed.  Transfers Overall transfer level: Needs assistance Equipment used: 2 person hand held assist Transfers: Sit to/from [facility] Sit to Stand: Mod assist;+2 physical assistance Stand pivot transfers: Min assist;+2 physical assistance       General transfer comment: Cues for sequencing.    Balance Overall balance assessment: Needs assistance Sitting-balance support: Feet supported;Bilateral upper extremity supported Sitting balance-Leahy Scale: Fair     Standing balance support: Bilateral upper extremity supported Standing balance-Leahy Scale: Poor                     ADL Overall ADL's : Needs  assistance/impaired     Grooming: Maximal assistance;Bed level;Wash/dry hands       Lower Body Bathing: Total assistance;Bed level       Lower Body Dressing: Total assistance Lower Body Dressing Details (indicate cue type and reason): to don socks sitting EOB Toilet Transfer: Moderate assistance;+2 for physical assistance;Stand-pivot Toilet Transfer Details (indicate cue type and reason): Simulated by stand pivot from EOB to EOB Toileting- Clothing Manipulation and Hygiene: Total assistance;Bed level Toileting - Clothing Manipulation Details (indicate cue type and reason): Pt found incontinent of bowel; total assist to clean     Functional mobility during ADLs: Minimal assistance;+2 for physical assistance (for stand pivot only)        Vision                     Perception     Praxis      Cognition   Behavior During Therapy: Flat affect Overall Cognitive Status: No family/caregiver present to determine baseline cognitive functioning                       Extremity/Trunk Assessment               Exercises     Shoulder Instructions       General Comments      Pertinent Vitals/ Pain       Pain Assessment: No/denies pain  Home Living  Prior Functioning/Environment              Frequency  Min 2X/week        Progress Toward Goals  OT Goals(current goals can now be found in the care plan section)  Progress towards OT goals: Progressing toward goals  Acute Rehab OT Goals Patient Stated Goal: to go home OT Goal Formulation: With patient  Plan Discharge plan remains appropriate    Co-evaluation    PT/OT/SLP Co-Evaluation/Treatment: Yes Reason for Co-Treatment: For patient/therapist safety;To address functional/ADL transfers   OT goals addressed during session: ADL's and self-care      End of Session Equipment Utilized During Treatment: Gait belt   Activity  Tolerance Patient tolerated treatment well   Patient Left in bed;with call bell/phone within reach;with bed alarm set   Nurse Communication Mobility status (RN tech assisting)        Time: [phone] OT Time Calculation (min): 26 min  Charges: OT General Charges $OT Visit: 1 Procedure OT Treatments $Self Care/Home Management : 8-22 mins  Binnie Kand M.S., OTR/L Pager: [phone]  09/10/2016, 10:03 AM

## 2016-09-10 NOTE — Progress Notes (Signed)
Physical Therapy Treatment Patient Details Name: Andrew [name] MRN: [id number] DOB: July 18, 1946 Today's Date: 09/10/2016    History of Present Illness Pt admitted with chest pain, fever, SOB and R elbow pain. + for MRSA bacteremia and R elbow cellulits. PMH: DM, AAA, CHF, HTN, blindness in R eye, anxiety and presumed endocarditis.     PT Comments    Pt reports feeling "okay" today. Assisted tech with clean up and transfer to new bed. Requiring Mod A for bed mobility and standing from EOB. Pt continues to have weakness and not eager to perform mobility. Performed SPT to bed with assist of 2 for safety and some coaxing. Pt unkempt and has stool all over himself. Skin is very sensitive to touch. Will continue to follow.    Follow Up Recommendations  SNF     Equipment Recommendations  None recommended by PT    Recommendations for Other Services       Precautions / Restrictions Precautions Precautions: Fall Restrictions Weight Bearing Restrictions: No    Mobility  Bed Mobility Overal bed mobility: Needs Assistance Bed Mobility: Rolling;Supine to Sit;Sit to Supine Rolling: Min guard   Supine to sit: Mod assist Sit to supine: Min assist   General bed mobility comments: Assist for trunk elevation from supine to sit. Assist for LEs into bed.  Transfers Overall transfer level: Needs assistance Equipment used: 2 person hand held assist Transfers: Sit to/from [facility] Sit to Stand: Mod assist;+2 physical assistance Stand pivot transfers: Min assist;+2 physical assistance       General transfer comment: Assist to power to standing with cues for sequencing, SPT bed to new bed with assist for balance.  Ambulation/Gait                 Stairs            Wheelchair Mobility    Modified Rankin (Stroke Patients Only)       Balance Overall balance assessment: Needs assistance Sitting-balance support: Feet supported;Bilateral upper extremity  supported Sitting balance-Leahy Scale: Fair Sitting balance - Comments: Able to sit EOB unsupported.   Standing balance support: During functional activity;Bilateral upper extremity supported Standing balance-Leahy Scale: Poor Standing balance comment: Reliant on external support in standing.                    Cognition Arousal/Alertness: Awake/alert Behavior During Therapy: Flat affect Overall Cognitive Status: No family/caregiver present to determine baseline cognitive functioning                 General Comments: Follows commands today. Not excited about mobility but cooperative.     Exercises      General Comments        Pertinent Vitals/Pain Pain Assessment: No/denies pain    Home Living                      Prior Function            PT Goals (current goals can now be found in the care plan section) Acute Rehab PT Goals Patient Stated Goal: to go home Progress towards PT goals: Progressing toward goals (slowly)    Frequency    Min 2X/week      PT Plan Current plan remains appropriate    Co-evaluation PT/OT/SLP Co-Evaluation/Treatment: Yes Reason for Co-Treatment: To address functional/ADL transfers;For patient/therapist safety PT goals addressed during session: Mobility/safety with mobility OT goals addressed during session: ADL's and self-care  End of Session Equipment Utilized During Treatment: Gait belt Activity Tolerance: Patient tolerated treatment well Patient left: in bed;with call bell/phone within reach;with bed alarm set;with nursing/sitter in room     Time: [id number] PT Time Calculation (min) (ACUTE ONLY): 26 min  Charges:  $Therapeutic Activity: 8-22 mins                    G Codes:      Lynett Brasil A Miracle Criado 09/10/2016, 10:43 AM Wray Kearns, PT, DPT [phone]

## 2016-09-10 NOTE — Progress Notes (Signed)
Patient ID: Andrew [name], male   DOB: [birth year], 71 y.o.   MRN: [id number]  Received psych consult for capacity evaluation. Reviewed chart and case discussed with staff RN on the floor but unable to complete the evaluation as patient has been out of the unit for barium swallow studies. Will try to catch up later today or tomorrow.   Andrew [name] 09/10/2016 4:04 PM

## 2016-09-10 NOTE — Consult Note (Signed)
[facility] Consult   Reason for Consult:  Capacity  Referring Physician:  Dr. Darrick Meigs Patient Identification: Andrew [name] MRN:  [id number] Principal Diagnosis: Bacteremia due to methicillin resistant Staphylococcus aureus Diagnosis:   Patient Active Problem List   Diagnosis Date Noted  . Protein-calorie malnutrition, severe [E43] 09/02/2016  . Elevated troponin [R74.8] 08/31/2016  . Pressure injury of skin [L89.90] 08/31/2016  . Sepsis ([place]) [A41.9] 08/30/2016  . Chest pain [R07.9] 08/30/2016  . AKI (acute kidney injury) ([place]) [N17.9] 08/30/2016  . Chronic diastolic CHF (congestive heart failure) ([place]) [I50.32] 08/30/2016  . Septic olecranon bursitis of right elbow [M71.121]   . Essential hypertension [I10]   . Hypokalemia [E87.6] 04/12/2015  . 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 Hypomagnesemia [E83.42]   . Bacteremia due to methicillin resistant Staphylococcus aureus [R78.81] 04/10/2015  . Atherosclerotic peripheral vascular disease ([place]) [I70.209] 04/10/2015  . Acute bronchitis [J20.9] 04/09/2015  . Hypertension [I10] 04/09/2015  . Hyperlipidemia [E78.5] 04/09/2015  . Alcohol abuse [F10.10] 04/09/2015  . Tobacco abuse disorder [Z72.0] 04/09/2015  . Acute encephalopathy [G93.40]     Total Time spent with patient: 1 hour  Subjective:   Andrew [name] is a 71 y.o. male patient admitted with Chest pain and SOB and poorly compliant with treatment team.  HPI:  Andrew [name] is a 71 y.o. male, seen, chart reviewed and case discussed with the staff RN for this face-to-face psychiatric consultation and evaluation of capacity to make his own medical decisions. This provider made second visit for this evaluation as he was not in his room during my first visit but had a good discussion with his RN. Patient reportedly received barium swallow test because he has been frequently refusing to oral intake. Patient is also  reluctant or noncooperative the treatment team. Patient is awake, alert, oriented to time place person but not situation. Patient reported his date of birth is 1945/12/29 but not putting any effort to tell me today date, instead says "I do not know ". Patient knows the name of the hospital, knew the city but refused to answer the name of the county. Patient stated current President of the [place] is Obama when prompted to answer the current president patient stated I don't keep up all that staff. Patient reported his son came and visited him last evening and mostly he was taking care of his neighbor. Reportedly patient daughter lives or works out of these down but supportive to him when she can. Family believes patient has no changes in his cognitive functions. It is a difficult examination because of reluctant ounces of the patient and quickly reports he does not know without efforts. Patient endorses history of drinking alcohol especially sixpacks of beer, last drink was about a month ago and had it long period of sober before that. Patient denied alcohol withdrawal symptoms, cravings and also history of treatment related to detoxification, withdrawal rehabilitation treatment. Patient stated he wants to go home and stay with his son and also need to pay his bills.   Medical history: Patient with medical history significant of hypertension, hyperlipidemia, diabetes mellitus, anxiety, AAA premises 4.8 cm on MRI 04/09/15), right eye blindness, dCHF, who presents with chest pain, SOB and right elbow pain.  Patient states that he has been having chest pain, shortness breath and generalized weakness for 2 days. His chest pain is located in the substernal area, moderate, nonradiating, pressure-like pain. It is associated with shortness of breath.  No cough, fever or chills. No runny nose or sore throat. Patient also has right elbow pain, swelling and redness in the past several days. Patient denies nausea,  vomiting, diarrhea, abdominal pain, symptoms of UTI or unilateral weakness.   Past Psychiatric History: denied history of mental illness and inpatient hospitalizations.  Risk to Self: Is patient at risk for suicide?: No Risk to Others:   Prior Inpatient Therapy:   Prior Outpatient Therapy:    Past Medical History:  Past Medical History:  Diagnosis Date  . Blind right eye   . Diabetes mellitus without complication ([place])   . Hyperlipemia   . Hypertension   . 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 No pertinent surgical history. Family History:  Family History  Problem Relation Age of Onset  . Diabetes Mellitus II Mother   . Diabetes Mellitus II Father   . Diabetes Mellitus II Sister   . Diabetes Mellitus II Sister    Family Psychiatric  History: Noncontributory Social History:  History  Alcohol Use  . 16.8 oz/week  . 28 Cans of beer per week     History  Drug use: Unknown    Social History   Social History  . Marital status: Single    Spouse name: N/A  . Number of children: N/A  . Years of education: N/A   Social History Main Topics  . Smoking status: Current Every Day Smoker    Packs/day: 1.50    Types: Cigarettes  . Smokeless tobacco: Never Used  . Alcohol use 16.8 oz/week    28 Cans of beer per week  . Drug use: Unknown  . Sexual activity: Not Asked   Other Topics Concern  . None   Social History Narrative  . None   Additional Social History:    Allergies:   Allergies  Allergen Reactions  . Penicillins Rash and Other (See Comments)    Tolerated Zosyn Jan 2018 Possibly rash? Has patient had a PCN reaction causing immediate rash, facial/tongue/throat swelling, SOB or lightheadedness with hypotension: YES Has patient had a PCN reaction causing severe rash involving mucus membranes or skin necrosis:NO Has patient had a PCN reaction that required hospitalization NO Has patient had a PCN reaction  occurring within the last 10 years:NO If all of the above answers are "NO", then may proceed with Cephalosporin use.    Labs:  Results for orders placed or performed during the hospital encounter of 08/30/16 (from the past 48 hour(s))  MRSA PCR Screening     Status: Abnormal   Collection Time: 09/08/16  2:19 PM  Result Value Ref Range   MRSA by PCR POSITIVE (A) NEGATIVE    Comment:        The GeneXpert MRSA Assay (FDA approved for NASAL specimens only), is one component of a comprehensive MRSA colonization surveillance program. It is not intended to diagnose MRSA infection nor to guide or monitor treatment for MRSA infections. RESULT CALLED TO, READ BACK BY AND VERIFIED WITH: Heath Lark RN 17:35 09/08/16 ([email or web address])   Glucose, capillary     Status: Abnormal   Collection Time: 09/08/16  2:27 PM  Result Value Ref Range   Glucose-Capillary 133 (H) 65 - 99 mg/dL   Comment 1 Notify RN    Comment 2 Document in Chart   Osmolality, urine     Status: None   Collection Time: 09/08/16  2:48 PM  Result Value Ref Range   Osmolality, Ur 568 300 -  900 mOsm/kg  Sodium, urine, random     Status: None   Collection Time: 09/08/16  2:48 PM  Result Value Ref Range   Sodium, Ur <10 mmol/L    Comment: REPEATED TO VERIFY  Creatinine, urine, random     Status: None   Collection Time: 09/08/16  2:48 PM  Result Value Ref Range   Creatinine, Urine 117.48 mg/dL  Lactic acid, plasma     Status: None   Collection Time: 09/08/16  2:53 PM  Result Value Ref Range   Lactic Acid, Venous 0.9 0.5 - 1.9 mmol/L  Osmolality     Status: Abnormal   Collection Time: 09/08/16  2:53 PM  Result Value Ref Range   Osmolality 273 (L) 275 - 295 mOsm/kg  Glucose, capillary     Status: Abnormal   Collection Time: 09/08/16  8:44 PM  Result Value Ref Range   Glucose-Capillary 103 (H) 65 - 99 mg/dL   Comment 1 Notify RN   Vancomycin, random     Status: None   Collection Time: 09/08/16  9:18 PM  Result Value Ref Range    Vancomycin Rm 25     Comment:        Random Vancomycin therapeutic range is dependent on dosage and time of specimen collection. A peak range is 20.0-40.0 ug/mL A trough range is 5.0-15.0 ug/mL          Glucose, capillary     Status: Abnormal   Collection Time: 09/09/16 12:45 AM  Result Value Ref Range   Glucose-Capillary 113 (H) 65 - 99 mg/dL   Comment 1 Notify RN   Basic metabolic panel     Status: Abnormal   Collection Time: 09/09/16  3:07 AM  Result Value Ref Range   Sodium 130 (L) 135 - 145 mmol/L   Potassium 4.1 3.5 - 5.1 mmol/L   Chloride 95 (L) 101 - 111 mmol/L   CO2 24 22 - 32 mmol/L   Glucose, Bld 116 (H) 65 - 99 mg/dL   BUN 23 (H) 6 - 20 mg/dL   Creatinine, Ser 0.75 0.61 - 1.24 mg/dL   Calcium 7.9 (L) 8.9 - 10.3 mg/dL   GFR calc non Af Amer >60 >60 mL/min   GFR calc Af Amer >60 >60 mL/min    Comment: (NOTE) The eGFR has been calculated using the CKD EPI equation. This calculation has not been validated in all clinical situations. eGFR's persistently <60 mL/min signify possible Chronic Kidney Disease.    Anion gap 11 5 - 15  Vancomycin, random     Status: None   Collection Time: 09/09/16  3:07 AM  Result Value Ref Range   Vancomycin Rm 21     Comment:        Random Vancomycin therapeutic range is dependent on dosage and time of specimen collection. A peak range is 20.0-40.0 ug/mL A trough range is 5.0-15.0 ug/mL          Glucose, capillary     Status: Abnormal   Collection Time: 09/09/16  3:53 AM  Result Value Ref Range   Glucose-Capillary 107 (H) 65 - 99 mg/dL   Comment 1 Notify RN   Glucose, capillary     Status: Abnormal   Collection Time: 09/09/16  8:32 AM  Result Value Ref Range   Glucose-Capillary 137 (H) 65 - 99 mg/dL   Comment 1 Notify RN    Comment 2 Document in Chart   Glucose, capillary     Status: Abnormal  Collection Time: 09/09/16 11:19 AM  Result Value Ref Range   Glucose-Capillary 118 (H) 65 - 99 mg/dL   Comment 1 Notify RN     Comment 2 Document in Chart   Glucose, capillary     Status: Abnormal   Collection Time: 09/09/16  3:24 PM  Result Value Ref Range   Glucose-Capillary 149 (H) 65 - 99 mg/dL   Comment 1 Document in Chart   Glucose, capillary     Status: Abnormal   Collection Time: 09/09/16 10:04 PM  Result Value Ref Range   Glucose-Capillary 233 (H) 65 - 99 mg/dL  Basic metabolic panel     Status: Abnormal   Collection Time: 09/10/16  4:16 AM  Result Value Ref Range   Sodium 129 (L) 135 - 145 mmol/L   Potassium 4.1 3.5 - 5.1 mmol/L   Chloride 94 (L) 101 - 111 mmol/L   CO2 23 22 - 32 mmol/L   Glucose, Bld 327 (H) 65 - 99 mg/dL   BUN 20 6 - 20 mg/dL   Creatinine, Ser 0.81 0.61 - 1.24 mg/dL   Calcium 7.7 (L) 8.9 - 10.3 mg/dL   GFR calc non Af Amer >60 >60 mL/min   GFR calc Af Amer >60 >60 mL/min    Comment: (NOTE) The eGFR has been calculated using the CKD EPI equation. This calculation has not been validated in all clinical situations. eGFR's persistently <60 mL/min signify possible Chronic Kidney Disease.    Anion gap 12 5 - 15  CBC     Status: Abnormal   Collection Time: 09/10/16  4:16 AM  Result Value Ref Range   WBC 14.3 (H) 4.0 - 10.5 K/uL   RBC 4.18 (L) 4.22 - 5.81 MIL/uL   Hemoglobin 12.2 (L) 13.0 - 17.0 g/dL   HCT 35.5 (L) 39.0 - 52.0 %   MCV 84.9 78.0 - 100.0 fL   MCH 29.2 26.0 - 34.0 pg   MCHC 34.4 30.0 - 36.0 g/dL   RDW 14.0 11.5 - 15.5 %   Platelets 441 (H) 150 - 400 K/uL  Magnesium     Status: None   Collection Time: 09/10/16  4:16 AM  Result Value Ref Range   Magnesium 1.8 1.7 - 2.4 mg/dL  Phosphorus     Status: None   Collection Time: 09/10/16  4:16 AM  Result Value Ref Range   Phosphorus 3.8 2.5 - 4.6 mg/dL  Glucose, capillary     Status: Abnormal   Collection Time: 09/10/16  8:00 AM  Result Value Ref Range   Glucose-Capillary 322 (H) 65 - 99 mg/dL  Glucose, capillary     Status: Abnormal   Collection Time: 09/10/16 11:18 AM  Result Value Ref Range    Glucose-Capillary 242 (H) 65 - 99 mg/dL    Current Facility-Administered Medications  Medication Dose Route Frequency Provider Last Rate Last Dose  . acetaminophen (TYLENOL) tablet 650 mg  650 mg Oral Q4H PRN Pixie Casino, MD   650 mg at 09/03/16 0912  . albuterol (PROVENTIL) (2.5 MG/3ML) 0.083% nebulizer solution 2.5 mg  2.5 mg Nebulization Q4H PRN Ivor Costa, MD      . amLODipine (NORVASC) tablet 10 mg  10 mg Oral Daily Ivor Costa, MD   10 mg at 09/08/16 1020  . aspirin tablet 325 mg  325 mg Oral Daily Ivor Costa, MD   325 mg at 09/10/16 0909  . atorvastatin (LIPITOR) tablet 40 mg  40 mg Oral q1800 Ivor Costa, MD  40 mg at 09/07/16 1936  . dextromethorphan-guaiFENesin (MUCINEX DM) 30-600 MG per 12 hr tablet 1 tablet  1 tablet Oral BID Javier Glazier, MD   1 tablet at 09/10/16 0909  . enoxaparin (LOVENOX) injection 40 mg  40 mg Subcutaneous Q24H Ivor Costa, MD   40 mg at 09/09/16 2100  . feeding supplement (ENSURE ENLIVE) (ENSURE ENLIVE) liquid 237 mL  237 mL Oral Q24H Theodis Blaze, MD   237 mL at 09/09/16 2000  . feeding supplement (GLUCERNA SHAKE) (GLUCERNA SHAKE) liquid 237 mL  237 mL Oral BID PC Theodis Blaze, MD   237 mL at 09/08/16 1014  . insulin aspart (novoLOG) injection 0-9 Units  0-9 Units Subcutaneous Q4H Javier Glazier, MD   3 Units at 09/10/16 1314  . insulin glargine (LANTUS) injection 10 Units  10 Units Subcutaneous QHS Oswald Hillock, MD      . ipratropium-albuterol (DUONEB) 0.5-2.5 (3) MG/3ML nebulizer solution 3 mL  3 mL Nebulization TID Alexa R Burns, MD      . magnesium hydroxide (MILK OF MAGNESIA) suspension 30 mL  30 mL Oral Daily PRN Theodis Blaze, MD   30 mL at 09/05/16 1756  . MEDLINE mouth rinse  15 mL Mouth Rinse BID Theodis Blaze, MD   15 mL at 09/10/16 1000  . metoprolol (LOPRESSOR) injection 5 mg  5 mg Intravenous Q4H PRN Omar Person, NP      . metoprolol succinate (TOPROL-XL) 24 hr tablet 100 mg  100 mg Oral Daily Ivor Costa, MD   100 mg at 09/10/16 0909   . multivitamin with minerals tablet 1 tablet  1 tablet Oral Daily Ivor Costa, MD   1 tablet at 09/10/16 0908  . mupirocin cream (BACTROBAN) 2 %   Topical Daily Theodis Blaze, MD      . nicotine (NICODERM CQ - dosed in mg/24 hours) patch 21 mg  21 mg Transdermal Daily Oswald Hillock, MD   21 mg at 09/10/16 1127  . nitroGLYCERIN (NITROSTAT) SL tablet 0.4 mg  0.4 mg Sublingual Q5 min PRN Ivor Costa, MD      . ondansetron [facility]) injection 4 mg  4 mg Intravenous Q6H PRN Ivor Costa, MD      . piperacillin-tazobactam (ZOSYN) IVPB 3.375 g  3.375 g Intravenous Q8H Rebecka Apley, RPH   3.375 g at 09/10/16 1131  . thiamine (VITAMIN B-1) tablet 100 mg  100 mg Oral Daily Ivor Costa, MD   100 mg at 09/10/16 0909  . vancomycin (VANCOCIN) IVPB 1000 mg/200 mL premix  1,000 mg Intravenous Q24H Haze Boyden, RPH   1,000 mg at 09/09/16 1520    Musculoskeletal: Strength & Muscle Tone: decreased Gait & Station: unable to stand Patient leans: N/A  Psychiatric Specialty Exam: Physical Exam as per history and physical   ROS shortness of breath, chest pain, leg pain and generalized weakness and loss of interest in participation.  No Fever-chills, No Headache, No changes with Vision or hearing, reports vertigo No problems swallowing food or Liquids, No Chest pain, Cough or Shortness of Breath, No Abdominal pain, No Nausea or Vommitting, Bowel movements are regular, No Blood in stool or Urine, No dysuria, No new skin rashes or bruises, No new joints pains-aches,  No new weakness, tingling, numbness in any extremity, No recent weight gain or loss, No polyuria, polydypsia or polyphagia,   A full 10 point Review of Systems was done, except as stated above, all  other Review of Systems were negative.  Blood pressure (!) 151/59, pulse 74, temperature 98 F (36.7 C), temperature source Oral, resp. rate 18, height 5' 8"  (1.727 m), weight 68.6 kg (151 lb 4.8 oz), SpO2 90 %.Body mass index is 23.01 kg/m.  General  Appearance: Guarded  Eye Contact:  Fair  Speech:  Clear and Coherent and Slow  Volume:  Decreased  Mood:  Depressed  Affect:  Constricted and Depressed  Thought Process:  Coherent and Goal Directed  Orientation:  Full (Time, Place, and Person)  Thought Content:  WDL  Suicidal Thoughts:  No  Homicidal Thoughts:  No  Memory:  Immediate;   Fair Recent;   Fair Remote;   Fair  Judgement:  Impaired  Insight:  Shallow  Psychomotor Activity:  Decreased  Concentration:  Concentration: Fair and Attention Span: Poor  Recall:  [employer] of Knowledge:  Fair  Language:  Good  Akathisia:  Negative  Handed:  Right  AIMS (if indicated):     Assets:  Desire for Improvement Financial Resources/Insurance Housing Leisure Time Resilience Social Support Transportation  ADL's:  Impaired  Cognition:  Impaired,  Mild  Sleep:        Treatment Plan Summary: 70 years old male with multiple medical problems presented with shortness of breath, chest pain and infection with staph aureus. Patient is a poor historian and there reluctant communicator, he also appeared to be withdrawn and isolative during my examination. He has a history of alcohol use disorder, reportedly sober for more than a month and has no history of detox or rehabilitation treatments.  Patient was not able to voice about his multiple medical problems and required treatment needs during my evaluation, which makes him not competent to make his own medical decisions.  Patient benefit from and required medical care power of attorney to make complicated and appropriate medical decisions for now in the future.  Appreciate psychiatric consultation and we sign off as of today Please contact [phone] or [phone] if needs further assistance  Disposition: No evidence of imminent risk to self or others at present.   Supportive therapy provided about ongoing stressors.  Ambrose Finland, MD 09/10/2016 2:09 PM

## 2016-09-10 NOTE — Consult Note (Signed)
WOC consulted for elbow, please see notes from Dr. Lorin Mercy has been contacted per our request for worsening of the right elbow, please continue to follow up with Dr. Lorin Mercy on treatment plan for this.    WOC consulted last week for left elbow and sacrum and buttocks, see our recommendations and note.  Orders in the computer for wound care.     Will add low air loss mattress now that patient is out of the ICU, however will touch base with bedside nurse on the safety of the bed due to the fact patient has been combative at times.  [place], [place]

## 2016-09-10 NOTE — Progress Notes (Signed)
Modified Barium Swallow Progress Note  Patient Details  Name: Andrew [name] MRN: [id number] Date of Birth: Mar 26, 1946  Today's Date: 09/10/2016  Modified Barium Swallow completed.  Full report located under Chart Review in the Imaging Section.  Brief recommendations include the following:  Clinical Impression  Pt has a mild oropharyngeal dysphagia with impaired mastication and oral clearance with solid foods, which may not be far from his baseline given condition of dentition. He has incomplete airway closure, allowing thin liquids to spill into the airway during the swallow. Penetration is deep and silent, although it does not go all the way to the vocal folds. SLP provided Mod cues to manage his impulsivity with intake, cough to clear penetrates, and utilize postural maneuvers, but pt did not or would not follow these commands. No airway compromise was observed with other textures. Recommend Dys 2 diet and nectar thick liquids. Will continue to follow to assess for possible upgrade as he continues to improve during acute admission.   Swallow Evaluation Recommendations       SLP Diet Recommendations: Dysphagia 2 (Fine chop) solids;Nectar thick liquid   Liquid Administration via: Cup;Straw   Medication Administration: Whole meds with puree   Supervision: Patient able to self feed;Full supervision/cueing for compensatory strategies   Compensations: Slow rate;Small sips/bites   Postural Changes: Remain semi-upright after after feeds/meals (Comment);Seated upright at 90 degrees   Oral Care Recommendations: Oral care BID   Other Recommendations: Order thickener from pharmacy;Prohibited food (jello, ice cream, thin soups);Remove water pitcher    Germain Osgood 09/10/2016,4:27 PM   Germain Osgood, M.A. CCC-SLP [phone]
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
## 2016-09-10 NOTE — Progress Notes (Signed)
Inpatient Diabetes Program Recommendations  AACE/ADA: New Consensus Statement on Inpatient Glycemic Control (2015)  Target Ranges:  Prepandial:   less than 140 mg/dL      Peak postprandial:   less than 180 mg/dL (1-2 hours)      Critically ill patients:  140 - 180 mg/dL   Results for Andrew [name], Andrew [name] (MRN [id number]) as of 09/10/2016 09:15  Ref. Range 09/10/2016 08:00  Glucose-Capillary Latest Ref Range: 65 - 99 mg/dL 322 (H)     Home DM Meds: Toujeo 60 units daily       Metformin 1000 mg BID       Januvia 100 mg daily  Current Insulin Orders: Novolog Sensitive Correction Scale/ SSI (0-9 units) Q4 hours        MD- Note patient had Hypoglycemia on the morning of 01/23 after receiving 10 units Lantus the night prior.  Lantus discontinued on 01/23 as a result.  CBGs were OK yesterday (01/24).  Elevated to 322 mg/dl this AM.  Please consider restarting a very small portion of patient's home dose of basal insulin:  Lantus 10 units daily (0.15 units/kg dosing based on weight of 68 kg)    --Will follow patient during hospitalization--  Wyn Quaker RN, MSN, CDE Diabetes Coordinator Inpatient Glycemic Control Team Team Pager: [phone] (8a-5p)

## 2016-09-11 LAB — BASIC METABOLIC PANEL
ANION GAP: 11 (ref 5–15)
BUN: 15 mg/dL (ref 6–20)
CALCIUM: 8 mg/dL — AB (ref 8.9–10.3)
CO2: 27 mmol/L (ref 22–32)
Chloride: 93 mmol/L — ABNORMAL LOW (ref 101–111)
Creatinine, Ser: 0.57 mg/dL — ABNORMAL LOW (ref 0.61–1.24)
Glucose, Bld: 125 mg/dL — ABNORMAL HIGH (ref 65–99)
POTASSIUM: 3.5 mmol/L (ref 3.5–5.1)
SODIUM: 131 mmol/L — AB (ref 135–145)

## 2016-09-11 LAB — CBC
HCT: 33.9 % — ABNORMAL LOW (ref 39.0–52.0)
Hemoglobin: 11.7 g/dL — ABNORMAL LOW (ref 13.0–17.0)
MCH: 29.3 pg (ref 26.0–34.0)
MCHC: 34.5 g/dL (ref 30.0–36.0)
MCV: 84.8 fL (ref 78.0–100.0)
PLATELETS: 456 10*3/uL — AB (ref 150–400)
RBC: 4 MIL/uL — AB (ref 4.22–5.81)
RDW: 13.7 % (ref 11.5–15.5)
WBC: 13.5 10*3/uL — AB (ref 4.0–10.5)

## 2016-09-11 LAB — GLUCOSE, CAPILLARY
GLUCOSE-CAPILLARY: 127 mg/dL — AB (ref 65–99)
GLUCOSE-CAPILLARY: 91 mg/dL (ref 65–99)
GLUCOSE-CAPILLARY: 177 mg/dL — AB (ref 65–99)
GLUCOSE-CAPILLARY: 272 mg/dL — AB (ref 65–99)
GLUCOSE-CAPILLARY: 237 mg/dL — AB (ref 65–99)
Glucose-Capillary: 174 mg/dL — ABNORMAL HIGH (ref 65–99)

## 2016-09-11 MED ORDER — ORAL CARE MOUTH RINSE
15.0000 mL | Freq: Two times a day (BID) | OROMUCOSAL | Status: DC
  Administered 2016-09-12 – 2016-09-15 (×4): 15 mL via OROMUCOSAL

## 2016-09-11 MED ORDER — IPRATROPIUM-ALBUTEROL 0.5-2.5 (3) MG/3ML IN SOLN: 3.0000 mL | RESPIRATORY_TRACT | Status: DC | PRN

## 2016-09-11 NOTE — Progress Notes (Signed)
Pharmacy Antibiotic Note  Andrew [name] is a 71 y.o. 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  Patient refused TEE so planning to treat with IV vancomycin for 6 weeks with anticipated stop date of 10/12/16 per ID.   Repeat blood cultures negative Scr stable, WBC stable, refusing doses at times   Plan: -Zosyn 3.375g IV q8h - planned stop date of 1/30 -Vancomycin 1g q24h - planned stop date of 2/26 -Vancomycin trough Monday or Tuesday of next week at new steady state    Height: 5\' 8"  (172.7 cm) Weight: 163 lb (73.9 kg) IBW/kg (Calculated) : 68.4  Temp (24hrs), Avg:98 F (36.7 C), Min:98 F (36.7 C), Max:98.1 F (36.7 C)   Recent Labs Lab 09/06/16 0317 09/07/16 0600 09/08/16 0910 09/08/16 1148 09/08/16 1453 09/08/16 2118 09/09/16 0307 09/10/16 0416 09/11/16 0405  WBC 16.6* 21.3* 28.2*  --   --   --   --  14.3* 13.5*  CREATININE 0.61 0.78 0.81  --   --   --  0.75 0.81 0.57*  LATICACIDVEN  --   --   --  1.1 0.9  --   --   --   --   VANCOTROUGH  --   --  36*  --   --   --   --   --   --   VANCORANDOM  --   --   --   --   --  25 21  --   --     Estimated Creatinine Clearance: 81.9 mL/min (by C-G formula based on SCr of 0.57 mg/dL (L)).    Allergies  Allergen Reactions  . Penicillins Rash and Other (See Comments)    Tolerated Zosyn Jan 2018 Possibly rash? Has patient had a PCN reaction causing immediate rash, facial/tongue/throat swelling, SOB or lightheadedness with hypotension: YES Has patient had a PCN reaction causing severe rash involving mucus membranes or skin necrosis:NO Has patient had a PCN reaction that required hospitalization NO Has patient had a PCN reaction occurring within the last 10 years:NO If all of the above answers are "NO", then may proceed with Cephalosporin use.    Thank you Anette Guarneri, PharmD [phone] 09/11/2016 10:57 AM

## 2016-09-11 NOTE — Progress Notes (Addendum)
3pm Received call back from pt dtr.  CSW explained that psych saw patient yesterday and did not think he was able to make sound decisions for himself at this time- asked if dtr wanted to reconsider recommendation for SNF. Pt dtr states she still plans to take patient home with home services.  CSW signing off   9:10am CSW aware that psychiatry stating patient does not have capacity to make decisions at this time.  CSW called and left message with patient dtr to discuss plan of care for patient- per previous CSW dtr was agreeable to taking patient home since he was refusing SNF but with patient not having capacity CSW reaching out to reassess.  Jorge Ny, LCSW Clinical Social Worker [phone]

## 2016-09-11 NOTE — Progress Notes (Signed)
Dr. Darrick Meigs notified patient's bowel movements are formed and unable to send stool for cdiff due to formed stool.  Order for cdiff discontinued per protocol.  Sanda Linger

## 2016-09-11 NOTE — Progress Notes (Signed)
Speech Language Pathology Treatment: Dysphagia  Patient Details Name: Andrew [name] MRN: [id number] DOB: 05/31/1946 Today's Date: 09/11/2016 Time: [id number] SLP Time Calculation (min) (ACUTE ONLY): 10 min  Assessment / Plan / Recommendation Clinical Impression  Pt more approachable, less antagonistic today.  Initially unwilling to eat/drink, but when distracted by discussion about his childhood, he was amenable to eating more breakfast.  Consumed nectar-thick liquids with good clinical tolerance, no overt s/s of aspiration, and without complaints about the consistency.  Soft solids also tolerated well.  Lungs diminished but clear.  Recommend continuing current diet at least through weekend; SLP will f/u Monday to determine potential for progressing diet.  Limited cues provided - impulsivity decreased today.     HPI HPI: 71 y.o.malewith hypertension, hyperlipidemia, diabetes mellitus, anxiety, AAA 4.8 cm on MRI (04/09/15), right eye blindness, anddCHF. Presented to the ED on 08/30/2016 with two days chest pain, SOB and right elbow pain. In the ED pt was found to have WBC 24.8, lactate of 4.85, troponin 0.07, acute renal injury with Cr 1.28, hyponatremia with sodium 129. Found to have LLL PNA and MRSA bacteremia secondary to olecranon bursitis of his right elbow withpossible mitral valve vegetation per echo. Clinical swallow eval on 1/17 with recs for regular diet, thin liquds.  On 1/23 pt was noted to have new fever, T Max of 100, with WBC increase to 28.2., and new LLL infiltrate on CXR. He has required increased oxygen support to NRB at 15 L.Pt transfered to ICU same date.  Has been refusing oxygen, further testing, as well as any discussions re: code status.  SLP reconsulted for formal swallowing evaluation due to ongoing aspiration concerns.       SLP Plan  Continue with current plan of care     Recommendations  Diet recommendations: Dysphagia 2 (fine chop);Nectar-thick liquid Medication  Administration: Whole meds with liquid Compensations: Slow rate;Small sips/bites                Oral Care Recommendations: Oral care BID Plan: Continue with current plan of care       GO                [name] 09/11/2016, 8:54 AM

## 2016-09-11 NOTE — Progress Notes (Signed)
[facility]  New pt with [facility] this hospital admission though he has been with [facility] in the past.  [facility] will provide [facility] and [place] for home IV ABX upon DC to home. [facility] will also verify coverage thru [facility] for PT, OT, MSW and [place].  We will work with pt and family based on need/covered services by [facility].  [facility] will be prepared for weekend DC. [facility] needs IV ABX script at DC for [facility] pharmacy to fill IV ABX.   Thank you.  If patient discharges after hours, please call [phone].   Larry Sierras 09/11/2016, 5:41 PM

## 2016-09-11 NOTE — Progress Notes (Signed)
Urinary catheter removed this morning about 0830, patient has not voided since catheter removal. Bladder scan x 2 completed only showing 330. Will pass information to oncoming RN.

## 2016-09-11 NOTE — Care Management (Addendum)
Case Management Note Initial Note Started by Andrew [name] 09-09-16 Patient Details  Name: Andrew [name] MRN: [id number] Date of Birth: 07/14/1946  Subjective/Objective:   Pt admitted with bactermia - later transferred to ICU for resp distress                 Action/Plan:  PTA from home with family -  PT recommended SNF however pt continues to refuse - states he is going home and is open to [facility].  Pt will need IV antibiotics post discharge with stop date late Feb 18.  [place] discussion with daughter and son scheduled today by attending NP.  Pysch consult ordered by attending to evaluate competency as pt is also refusing treatments including required oxygen (desats into high 70's on RA) and evaluations at [facility].  CM will continue to follow for discharge needs.     Expected Discharge Date:                         Expected Discharge Plan:  [place]  In-House Referral:  Clinical Social Work  Discharge planning Services  CM Consult  Post Acute Care Choice: Home Health, Durable Medical Equipment Choice offered to:  Adult Children  DME Arranged:   IV Pump/ Equipment DME Agency:  [facility] Arranged:RN, PT, OT, Aide, Social Worker, IV Antibiotics     HH Agency:  [facility]  Status of Service:  In process, will continue to follow  If discussed at [place] of Stay Meetings, dates discussed:  09-08-16, 09-10-16, 09-14-16  Additional Comments: 09-15-16 0842 MD [place] aware of change in plans. Plan to write for d/c home today. Daughter to pick up after 5 pm. Jacqlyn Krauss, RN, BSN [phone]   09-14-16 [place], RN, BSN [phone] Daughter Andrew [name] called in regards to disposition plan- per daughter change in plans. Family no longer wanting SNF. Pt's sister n law  Andrew [name] is willing to stay with patient 24/7 and assist with care. Plan will be for home with home services. CM did ask if pt will need DME and daughter is  refusing any DME at this time. CM did offer ambulance transport home. Daughter refused and stated they will pick him up after 5 pm in private vehicle. CM did call Andrew [name] with [facility] to make her aware of the referral. SOC to begin within 24-48 hours post d/c. No further needs from CM at this time.    09-14-16 [place], [place] [phone] Plans changed over the weekend- daughter decided that they will not be able to care for patient at the home. CSW did fax the patient out. Bed Offers are available. CM did try to call daughter several times and no return call back. CSW did try to call as well. Plan will be for SNF 09-15-16.     09-11-16 1637 Jacqlyn Krauss, RN,BSN [phone] CM received call in regards to pt's family is refusing SNF Placement and wants [facility]. CM did reach out to daughter Andrew [name] at [phone]. Andrew [name] states pt is from home with son Andrew [name] and he will provide supervision at home with [place] visiting daily. Per daughter pt has used [facility] in the past and wants to use again. CM did state pt will benefit from [facility], PT, [place], Aide and SW- daughter is agreeable. Pt has and Electric wheelchair at home. Only DME will be for IV Pump/ Equipment at this time. CM did make referral with  Andrew [name] with [facility] in regards to [place] and possible IV Vanc for 6 weeks. Pt will need PICC Line and should be stable to d/c. [facility] to follow the case. Orders will need to be written for [facility] and F2F.       09/09/2016 CM spoke with pt however orientation is in question, pt confused and trying to get out of bed with catheter.  CM spoke with daughter - daughter states that PTA pt mostly independent and lived with his son however son works away from home- son at work this am and unable to be reached.  CM informed daughter or SNF recommendation and why the recommendation was made including mobility safety, cognitivee concerns in addition to needing IV antibiotics until mid Feb - daughter stated  she understood and will work on developing a 24 hour care plan for father.  CM verified with NP that both psyche consult was ordered and that a palliative discussion would be initiated post psyche eval due to pt now refusing treatments/recommendations.
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
## 2016-09-12 LAB — GLUCOSE, CAPILLARY
GLUCOSE-CAPILLARY: 179 mg/dL — AB (ref 65–99)
GLUCOSE-CAPILLARY: 117 mg/dL — AB (ref 65–99)
GLUCOSE-CAPILLARY: 163 mg/dL — AB (ref 65–99)
Glucose-Capillary: 170 mg/dL — ABNORMAL HIGH (ref 65–99)
Glucose-Capillary: 176 mg/dL — ABNORMAL HIGH (ref 65–99)

## 2016-09-12 NOTE — Progress Notes (Signed)
Spoke with Alfredo Martinez, staff still unable to get in contact with family for PICC consent.

## 2016-09-12 NOTE — Progress Notes (Signed)
Pt found OOB on floor by NT after answering bed alarm. Pt states he "slid out of the bed" while repositioning himself. Denies injuries. Denies pain. No signs of injury noted. Assisted back to bed with assist x 2. Side rail up x 4 for safety. Plan move to camera room when available. Dr. Darrick Meigs sent text message to notify of fall. No new orders received.

## 2016-09-12 NOTE — Progress Notes (Signed)
Attempted to call dtr, Janette, for PICC consent, no answer. Spoke with Marya Amsler RN regarding obtaining consent for PICC placement from dtr.  If she comes to bedside today.

## 2016-09-12 NOTE — Progress Notes (Signed)
Pt assisted to [facility], but unable to void.  Bladder scan shows 575mL.  Straight cath performed using sterile technique by this RN, assisted by Beverlee Nims, NT.  446mL clear, dark yellow, odorless urine obtained.  Pt tolerated well.  Will continue to monitor.  Jodell Cipro

## 2016-09-12 NOTE — Progress Notes (Signed)
[facility]  PROGRESS NOTE  Andrew [name] QMG:[id number] DOB: 02/24/1946 DOA: 08/30/2016 PCP: Woody Seller, MD   Brief HPI:   71 y.o. male with medical history significant of hypertension, hyperlipidemia, diabetes mellitus, anxiety, AAA premises 4.8 cm on MRI 04/09/15), right eye blindness, dCHF, who presented with chest pain, SOB and right elbow pain.   In the ED pt was found to have WBC 24.8, lactate of 4.85, troponin 0.07, acute renal injury with Cr 1.28, hyponatremia with sodium 129. Found to have LLL PNA and MRSA bacteremia secondary to olecranon bursitis of his right elbow with possible mitral valve vegetation per echo. On 1/23 pt was noted to have new fever, T Max of 100, with WBC increase to 28.2., and new LLL infiltrate on CXR. He has required increased oxygen support to NRB at 15 L.  Subjective   Patient seen and examined, denies chest pain. Awaiting picc line placement before discharge home.   Assessment/Plan:     1. Chest pain- patient presented with chest pain with positive troponin, CT angiogram was negative for PE and infiltration. Likely it was from demand ischemia in the setting of sepsis. Initial troponin was 0.07 -0.21. Cardiology was following plan for TEE but patient declined so cardiology has signed off. No need to further cycle troponins. Patient denies chest pain at this time. 2. Sepsis-secondary to right olecranon bursitis, patient met her to give for sepsis with elevated lactate, leukocytosis, tachycardia and tachypnea. ID was consulted and patient started on vancomycin. Stop date on 10/12/2016 per ID recommendations. Orthopedics was consulted and attempted aspirating but only trace blood obtained no pocket of fluid observed. Plan to keep right arm elevated. Patient had elevated white count ,  blood culture showed no growth, patient declined TEE , total 6 weeks of antibiotic therapy recommended. There was concern for progressive sepsis despite use of  vancomycin so patient was transferred to the [facility] service. At this time sepsis has resolved. WBC down to 13.5 3. Healthcare associated pneumonia, MRSA bacteremia-chest x-ray showed left lower lobe opacity concerning for pneumonia, patient is currently on vancomycin. Was started on Zosyn on 09/08/2016. We'll continue with both vancomycin and Zosyn. We'll stop Zosyn on 09/15/2016 after completing 7 days of therapy. 4. Dysphagia- there is suspected aspiration, patient had speech therapy evaluation. Started on dys 2 diet. 5. Tobacco abuse- started on nicotine patch. 6. Diabetes mellitus- started back on Lantus 10 units subcutaneous daily, sliding scale insulin with NovoLog 7. Chronic diastolic CHF- EF 89-16%, patient refused TEE. Currently euvolemic. 8. Essential hypertension-continue bloody pain, metoprolol. Blood pressure is well controlled. 9. AK I, hyponatremia- creatinine back to 0.81, baseline. Sodium still 131 likely chronic hyponatremia. 10. Pressure injury- 3 full thickness abrasions, left elbow; .3X.3cm, Left outer elbow .5X2.5X.1cm and .8X.5X.1cm. WOC recommendations is to use Bactroban to provide antimicrobial benefits and promote moist healing, foam dressing to protect from further injury. inner gluteal fold and sacrum is red and moist; appearance consistent with moisture associated skin damage. - recommend foam dressing to protect from further injury     DVT prophylaxis: Lovenox  Code Status: Full code  Family Communication: patient's daughter wants to take him home. Will discharge home after placement of picc line.  Disposition Plan: SNF   Consultants:  PCCM  Procedures:  None     Antibiotics:   Anti-infectives    Start     Dose/Rate Route Frequency Ordered Stop   09/09/16 1500  vancomycin (VANCOCIN) IVPB 1000 mg/200 mL premix  1,000 mg 200 mL/hr over 60 Minutes Intravenous Every 24 hours 09/09/16 0737     09/08/16 1130  aztreonam (AZACTAM) 2 g in dextrose 5 % 50  mL IVPB  Status:  Discontinued     2 g 100 mL/hr over 30 Minutes Intravenous  Once 09/08/16 1036 09/08/16 1051   09/08/16 1130  piperacillin-tazobactam (ZOSYN) IVPB 3.375 g     3.375 g 12.5 mL/hr over 240 Minutes Intravenous Every 8 hours 09/08/16 1051     09/02/16 2030  vancomycin (VANCOCIN) 1,250 mg in sodium chloride 0.9 % 250 mL IVPB  Status:  Discontinued     1,250 mg 166.7 mL/hr over 90 Minutes Intravenous Every 12 hours 09/02/16 2012 09/08/16 1018   09/01/16 0800  vancomycin (VANCOCIN) IVPB 750 mg/150 ml premix  Status:  Discontinued     750 mg 150 mL/hr over 60 Minutes Intravenous Every 12 hours 09/01/16 0726 09/02/16 2012   08/31/16 0800  vancomycin (VANCOCIN) 500 mg in sodium chloride 0.9 % 100 mL IVPB  Status:  Discontinued     500 mg 100 mL/hr over 60 Minutes Intravenous Every 12 hours 08/31/16 0739 09/01/16 0726   08/31/16 0730  vancomycin (VANCOCIN) IVPB 750 mg/150 ml premix  Status:  Discontinued     750 mg 150 mL/hr over 60 Minutes Intravenous Every 12 hours 08/30/16 2102 08/31/16 0739   08/31/16 0400  piperacillin-tazobactam (ZOSYN) IVPB 3.375 g  Status:  Discontinued     3.375 g 12.5 mL/hr over 240 Minutes Intravenous Every 8 hours 08/30/16 2102 08/31/16 1248   08/30/16 1830  piperacillin-tazobactam (ZOSYN) IVPB 3.375 g     3.375 g 100 mL/hr over 30 Minutes Intravenous  Once 08/30/16 1828 08/30/16 1945   08/30/16 1830  vancomycin (VANCOCIN) IVPB 1000 mg/200 mL premix     1,000 mg 200 mL/hr over 60 Minutes Intravenous  Once 08/30/16 1828 08/30/16 2008       Objective   Vitals:   09/11/16 2000 09/11/16 2339 09/11/16 2342 09/12/16 0522  BP: 138/68  (!) 123/56 (!) 144/65  Pulse: 69  77   Resp: _0 Temp: 98.7 F (37.1 C) 98.7 F (37.1 C)  98.3 F (36.8 C)  TempSrc: Oral   Oral  SpO2: 96%  96% 96%  Weight:    69.4 kg (153 lb)  Height:        Intake/Output Summary (Last 24 hours) at 09/12/16 1134 Last data filed at 09/12/16 0600  Gross per 24 hour   Intake             1860 ml  Output              400 ml  Net             1460 ml   Filed Weights   09/10/16 0500 09/11/16 0700 09/12/16 0522  Weight: 68.6 kg (151 lb 4.8 oz) 73.9 kg (163 lb) 69.4 kg (153 lb)     Physical Examination:  General exam: Appears calm and comfortable. Respiratory system: Clear to auscultation. Respiratory effort normal. Cardiovascular system:  RRR. No  murmurs, rubs, gallops. No pedal edema. GI system: Abdomen is nondistended, soft and nontender. No organomegaly.  Central nervous system. No focal neurological deficits. 5 x 5 power in all extremities. Skin: No rashes, lesions or ulcers. Psychiatry: Alert, oriented x 3.Lacks judgement and insight for decision making.    Data Reviewed: I have personally reviewed following labs and imaging studies  CBG:  Recent Labs  Lab 09/11/16 1631 09/11/16 2030 09/11/16 2329 09/12/16 0517 09/12/16 0734  GLUCAP 174* 272* 237* 179* 117*    CBC:  Recent Labs Lab 09/06/16 0317 09/07/16 0600 09/08/16 0910 09/10/16 0416 09/11/16 0405  WBC 16.6* 21.3* 28.2* 14.3* 13.5*  HGB 12.9* 12.9* 12.4* 12.2* 11.7*  HCT 37.9* 37.3* 36.1* 35.5* 33.9*  MCV 84.6 85.2 84.9 84.9 84.8  PLT 220 268 330 441* 456*    Basic Metabolic Panel:  Recent Labs Lab 09/06/16 0317 09/07/16 0600 09/08/16 0910 09/09/16 0307 09/10/16 0416 09/11/16 0405  NA 129* 127* 128* 130* 129* 131*  K 4.8 4.5 4.5 4.1 4.1 3.5  CL 96* 94* 94* 95* 94* 93*  CO2 _0 GLUCOSE 129* 63* 99 116* 327* 125*  BUN 17 20 22* 23* 20 15  CREATININE 0.61 0.78 0.81 0.75 0.81 0.57*  CALCIUM 8.0* 8.1* 8.0* 7.9* 7.7* 8.0*  MG 2.0  --   --   --  1.8  --   PHOS  --   --   --   --  3.8  --     Recent Results (from the past 240 hour(s))  Culture, blood (routine x 2)     Status: None   Collection Time: 09/04/16  4:41 AM  Result Value Ref Range Status   Specimen Description BLOOD LEFT HAND  Final   Special Requests BOTTLES DRAWN AEROBIC AND  ANAEROBIC 5CC  Final   Culture NO GROWTH 5 DAYS  Final   Report Status 09/09/2016 FINAL  Final  Culture, blood (routine x 2)     Status: None   Collection Time: 09/04/16  4:46 AM  Result Value Ref Range Status   Specimen Description BLOOD LEFT HAND  Final   Special Requests IN PEDIATRIC BOTTLE 4CC  Final   Culture NO GROWTH 5 DAYS  Final   Report Status 09/09/2016 FINAL  Final  Culture, blood (x 2)     Status: None (Preliminary result)   Collection Time: 09/08/16 11:48 AM  Result Value Ref Range Status   Specimen Description BLOOD LEFT ANTECUBITAL  Final   Special Requests BOTTLES DRAWN AEROBIC AND ANAEROBIC 10CC  Final   Culture NO GROWTH 4 DAYS  Final   Report Status PENDING  Incomplete  Culture, blood (x 2)     Status: None (Preliminary result)   Collection Time: 09/08/16 11:48 AM  Result Value Ref Range Status   Specimen Description BLOOD BLOOD LEFT FOREARM  Final   Special Requests BOTTLES DRAWN AEROBIC ONLY 5CC  Final   Culture NO GROWTH 4 DAYS  Final   Report Status PENDING  Incomplete  MRSA PCR Screening     Status: Abnormal   Collection Time: 09/08/16  2:19 PM  Result Value Ref Range Status   MRSA by PCR POSITIVE (A) NEGATIVE Final    Comment:        The GeneXpert MRSA Assay (FDA approved for NASAL specimens only), is one component of a comprehensive MRSA colonization surveillance program. It is not intended to diagnose MRSA infection nor to guide or monitor treatment for MRSA infections. RESULT CALLED TO, READ BACK BY AND VERIFIED WITH: D. Turner RN 17:35 09/08/16 ([email or web address])      Liver Function Tests: No results for input(s): AST, ALT, ALKPHOS, BILITOT, PROT, ALBUMIN in the last 168 hours. No results for input(s): LIPASE, AMYLASE in the last 168 hours. No results for input(s): AMMONIA in the last 168 hours.  Cardiac Enzymes: No results for  input(s): CKTOTAL, CKMB, CKMBINDEX, TROPONINI in the last 168 hours. BNP (last 3 results)  Recent Labs  08/30/16 2216   BNP 292.7*    ProBNP (last 3 results) No results for input(s): PROBNP in the last 8760 hours.    Studies: Dg Swallowing Func-speech Pathology  Result Date: 09/10/2016 Objective Swallowing Evaluation: Type of Study: MBS-Modified Barium Swallow Study Patient Details Name: Andrew [name] MRN: [id number] Date of Birth: 04-17-1946 Today's Date: 09/10/2016 Time: SLP Start Time (ACUTE ONLY): 1510-SLP Stop Time (ACUTE ONLY): 1529 SLP Time Calculation (min) (ACUTE ONLY): 19 min Past Medical History: Past Medical History: Diagnosis Date . Blind right eye  . Diabetes mellitus without complication ([place])  . Hyperlipemia  . Hypertension  . 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 HPI: 71 y.o.malewith hypertension, hyperlipidemia, diabetes mellitus, anxiety, AAA 4.8 cm on MRI (04/09/15), right eye blindness, anddCHF. Presented to the ED on 08/30/2016 with two days chest pain, SOB and right elbow pain. In the ED pt was found to have WBC 24.8, lactate of 4.85, troponin 0.07, acute renal injury with Cr 1.28, hyponatremia with sodium 129. Found to have LLL PNA and MRSA bacteremia secondary to olecranon bursitis of his right elbow withpossible mitral valve vegetation per echo. Clinical swallow eval on 1/17 with recs for regular diet, thin liquds.  On 1/23 pt was noted to have new fever, T Max of 100, with WBC increase to 28.2., and new LLL infiltrate on CXR. He has required increased oxygen support to NRB at 15 L.Pt transfered to ICU same date.  Has been refusing oxygen, further testing, as well as any discussions re: code status.  SLP reconsulted for formal swallowing evaluation due to ongoing aspiration concerns.  Subjective: Irritable, but willing to participate in assessment Assessment / Plan / Recommendation CHL IP CLINICAL IMPRESSIONS 09/10/2016 Therapy Diagnosis Mild oral phase dysphagia;Mild pharyngeal phase dysphagia Clinical  Impression Pt has a mild oropharyngeal dysphagia with impaired mastication and oral clearance with solid foods, which may not be far from his baseline given condition of dentition. He has incomplete airway closure, allowing thin liquids to spill into the airway during the swallow. Penetration is deep and silent, although it does not go all the way to the vocal folds. SLP provided Mod cues to manage his impulsivity with intake, cough to clear penetrates, and utilize postural maneuvers, but pt did not or would not follow these commands. No airway compromise was observed with other textures. Recommend Dys 2 diet and nectar thick liquids. Will continue to follow to assess for possible upgrade as he continues to improve during acute admission. Impact on safety and function Mild aspiration risk;Moderate aspiration risk   CHL IP TREATMENT RECOMMENDATION 09/10/2016 Treatment Recommendations Therapy as outlined in treatment plan below   Prognosis 09/10/2016 Prognosis for Safe Diet Advancement Good Barriers to Reach Goals -- Barriers/Prognosis Comment -- CHL IP DIET RECOMMENDATION 09/10/2016 SLP Diet Recommendations Dysphagia 2 (Fine chop) solids;Nectar thick liquid Liquid Administration via Cup;Straw Medication Administration Whole meds with puree Compensations Slow rate;Small sips/bites Postural Changes Remain semi-upright after after feeds/meals (Comment);Seated upright at 90 degrees   CHL IP OTHER RECOMMENDATIONS 09/10/2016 Recommended Consults -- Oral Care Recommendations Oral care BID Other Recommendations Order thickener from pharmacy;Prohibited food (jello, ice cream, thin soups);Remove water pitcher   CHL IP FOLLOW UP RECOMMENDATIONS 09/10/2016 Follow up Recommendations Skilled Nursing facility   [facility] IP FREQUENCY AND DURATION 09/10/2016 Speech Therapy Frequency (ACUTE ONLY) min 2x/week  Treatment Duration 2 weeks      CHL IP ORAL PHASE 09/10/2016 Oral Phase Impaired Oral - Pudding Teaspoon -- Oral - Pudding Cup -- Oral -  Honey Teaspoon -- Oral - Honey Cup -- Oral - Nectar Teaspoon -- Oral - Nectar Cup WFL Oral - Nectar Straw WFL Oral - Thin Teaspoon -- Oral - Thin Cup WFL Oral - Thin Straw -- Oral - Puree WFL Oral - Mech Soft Impaired mastication;Lingual/palatal residue Oral - Regular -- Oral - Multi-Consistency -- Oral - Pill -- Oral Phase - Comment --  CHL IP PHARYNGEAL PHASE 09/10/2016 Pharyngeal Phase Impaired Pharyngeal- Pudding Teaspoon -- Pharyngeal -- Pharyngeal- Pudding Cup -- Pharyngeal -- Pharyngeal- Honey Teaspoon -- Pharyngeal -- Pharyngeal- Honey Cup -- Pharyngeal -- Pharyngeal- Nectar Teaspoon -- Pharyngeal -- Pharyngeal- Nectar Cup WFL Pharyngeal -- Pharyngeal- Nectar Straw WFL Pharyngeal -- Pharyngeal- Thin Teaspoon -- Pharyngeal -- Pharyngeal- Thin Cup Reduced airway/laryngeal closure;Penetration/Aspiration during swallow;Compensatory strategies attempted (with notebox) Pharyngeal Material enters airway, remains ABOVE vocal cords and not ejected out Pharyngeal- Thin Straw -- Pharyngeal -- Pharyngeal- Puree WFL Pharyngeal -- Pharyngeal- Mechanical Soft WFL Pharyngeal -- Pharyngeal- Regular -- Pharyngeal -- Pharyngeal- Multi-consistency -- Pharyngeal -- Pharyngeal- Pill -- Pharyngeal -- Pharyngeal Comment --  CHL IP CERVICAL ESOPHAGEAL PHASE 09/10/2016 Cervical Esophageal Phase WFL Pudding Teaspoon -- Pudding Cup -- Honey Teaspoon -- Honey Cup -- Nectar Teaspoon -- Nectar Cup -- Nectar Straw -- Thin Teaspoon -- Thin Cup -- Thin Straw -- Puree -- Mechanical Soft -- Regular -- Multi-consistency -- Pill -- Cervical Esophageal Comment -- No flowsheet data found. Germain Osgood 09/10/2016, 4:27 PM Germain Osgood, M.A. CCC-SLP [phone]               Scheduled Meds: . amLODipine  10 mg Oral Daily  . aspirin  325 mg Oral Daily  . atorvastatin  40 mg Oral q1800  . dextromethorphan-guaiFENesin  1 tablet Oral BID  . enoxaparin (LOVENOX) injection  40 mg Subcutaneous Q24H  . feeding supplement (ENSURE ENLIVE)   237 mL Oral Q24H  . feeding supplement (GLUCERNA SHAKE)  237 mL Oral BID PC  . insulin aspart  0-9 Units Subcutaneous Q4H  . insulin glargine  10 Units Subcutaneous QHS  . mouth rinse  15 mL Mouth Rinse BID  . metoprolol succinate  100 mg Oral Daily  . multivitamin with minerals  1 tablet Oral Daily  . mupirocin cream   Topical Daily  . nicotine  21 mg Transdermal Daily  . piperacillin-tazobactam (ZOSYN)  IV  3.375 g Intravenous Q8H  . thiamine  100 mg Oral Daily  . vancomycin  1,000 mg Intravenous Q24H      Time spent: 25 min  [facility] Pager [phone]. If 7PM-7AM, please contact night-coverage at [email or web address], Office  [phone]  password [email or web address] 09/12/2016, 11:34 AM  LOS: 13 days

## 2016-09-13 LAB — GLUCOSE, CAPILLARY
GLUCOSE-CAPILLARY: 115 mg/dL — AB (ref 65–99)
GLUCOSE-CAPILLARY: 134 mg/dL — AB (ref 65–99)
GLUCOSE-CAPILLARY: 254 mg/dL — AB (ref 65–99)
Glucose-Capillary: 104 mg/dL — ABNORMAL HIGH (ref 65–99)
Glucose-Capillary: 138 mg/dL — ABNORMAL HIGH (ref 65–99)
Glucose-Capillary: 204 mg/dL — ABNORMAL HIGH (ref 65–99)

## 2016-09-13 LAB — CULTURE, BLOOD (ROUTINE X 2)
CULTURE: NO GROWTH
Culture: NO GROWTH

## 2016-09-13 MED ORDER — SODIUM CHLORIDE 0.9% FLUSH
10.0000 mL | Freq: Two times a day (BID) | INTRAVENOUS | Status: DC
  Administered 2016-09-13 – 2016-09-14 (×3): 10 mL

## 2016-09-13 MED ORDER — SODIUM CHLORIDE 0.9% FLUSH: 10.0000 mL | INTRAVENOUS | Status: DC | PRN

## 2016-09-13 NOTE — Progress Notes (Signed)
Spoke with charge RN this AM regarding d/c plan- pt's daughter now wants pt to go to SNF (pt does not have capacity to make medical decisions per psych)- PICC line has been placed for long term abx- spoke with CSW- Linton Flemings this AM regarding need to start STSNF process- CSW to follow for placement needs- pt will need BCBS auth.

## 2016-09-13 NOTE — NC FL2 (Signed)
[place] LEVEL OF CARE SCREENING TOOL     IDENTIFICATION  Patient Name: Andrew [name] Birthdate: 11/07/1945 Sex: male Admission Date (Current Location): 08/30/2016  [facility] [place] Number:  Herbalist and Address:  [facility], [place] [address], [place], [place] [address]      Provider Number: [id number]  Attending Physician Name and Address:  Oswald Hillock, MD  Relative Name and Phone Number:  Dtr [phone]    Current Level of Care: Hospital Recommended Level of Care: [place] Prior Approval Number:    Date Approved/Denied:   PASRR Number: [id number]  Discharge Plan: SNF    Current Diagnoses: Patient Active Problem List   Diagnosis Date Noted  . Protein-calorie malnutrition, severe 09/02/2016  . Elevated troponin 08/31/2016  . Pressure injury of skin 08/31/2016  . Sepsis ([place]) 08/30/2016  . Chest pain 08/30/2016  . AKI (acute kidney injury) ([place]) 08/30/2016  . Chronic diastolic CHF (congestive heart failure) ([place]) 08/30/2016  . Septic olecranon bursitis of right elbow   . Essential hypertension   . Hypokalemia 04/12/2015  . 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 Hypomagnesemia   . Bacteremia due to methicillin resistant Staphylococcus aureus 04/10/2015  . Atherosclerotic peripheral vascular disease ([place]) 04/10/2015  . Acute bronchitis 04/09/2015  . Hypertension 04/09/2015  . Hyperlipidemia 04/09/2015  . Alcohol abuse 04/09/2015  . Tobacco abuse disorder 04/09/2015  . Acute encephalopathy     Orientation RESPIRATION BLADDER Height & Weight     Self, Time, Situation  Normal Continent Weight: 164 lb (74.4 kg) Height:  5\' 8"  (172.7 cm)  BEHAVIORAL SYMPTOMS/MOOD NEUROLOGICAL BOWEL NUTRITION STATUS      Continent Diet (See DC Summary)  AMBULATORY STATUS COMMUNICATION OF NEEDS Skin   Limited Assist Verbally Normal                       Personal  Care Assistance Level of Assistance  Bathing, Dressing Bathing Assistance: Limited assistance   Dressing Assistance: Limited assistance     Functional Limitations Info  Hearing, Speech, Sight Sight Info: Adequate Hearing Info: Adequate Speech Info: Adequate    SPECIAL CARE FACTORS FREQUENCY  PT (By licensed PT), OT (By licensed OT)     PT Frequency: 5x wk OT Frequency: 5x wk            Contractures Contractures Info: Not present    Additional Factors Info  Code Status, Allergies Code Status Info: Full Allergies Info: Penicillins           Current Medications (09/13/2016):  This is the current hospital active medication list Current Facility-Administered Medications  Medication Dose Route Frequency Provider Last Rate Last Dose  . acetaminophen (TYLENOL) tablet 650 mg  650 mg Oral Q4H PRN Pixie Casino, MD   650 mg at 09/03/16 0912  . albuterol (PROVENTIL) (2.5 MG/3ML) 0.083% nebulizer solution 2.5 mg  2.5 mg Nebulization Q4H PRN Ivor Costa, MD      . amLODipine (NORVASC) tablet 10 mg  10 mg Oral Daily Ivor Costa, MD   10 mg at 09/13/16 1104  . aspirin tablet 325 mg  325 mg Oral Daily Ivor Costa, MD   325 mg at 09/13/16 1103  . atorvastatin (LIPITOR) tablet 40 mg  40 mg Oral q1800 Ivor Costa, MD   40 mg at 09/12/16 1822  . dextromethorphan-guaiFENesin (MUCINEX DM) 30-600 MG per 12 hr tablet  1 tablet  1 tablet Oral BID Javier Glazier, MD   1 tablet at 09/13/16 1107  . enoxaparin (LOVENOX) injection 40 mg  40 mg Subcutaneous Q24H Ivor Costa, MD   40 mg at 09/12/16 2133  . feeding supplement (ENSURE ENLIVE) (ENSURE ENLIVE) liquid 237 mL  237 mL Oral Q24H Theodis Blaze, MD   237 mL at 09/11/16 2029  . feeding supplement (GLUCERNA SHAKE) (GLUCERNA SHAKE) liquid 237 mL  237 mL Oral BID PC Theodis Blaze, MD   237 mL at 09/08/16 1014  . insulin aspart (novoLOG) injection 0-9 Units  0-9 Units Subcutaneous Q4H Javier Glazier, MD   1 Units at 09/13/16 1255  . insulin glargine  (LANTUS) injection 10 Units  10 Units Subcutaneous QHS Oswald Hillock, MD   10 Units at 09/12/16 2200  . ipratropium-albuterol (DUONEB) 0.5-2.5 (3) MG/3ML nebulizer solution 3 mL  3 mL Nebulization Q4H PRN Oswald Hillock, MD      . magnesium hydroxide (MILK OF MAGNESIA) suspension 30 mL  30 mL Oral Daily PRN Theodis Blaze, MD   30 mL at 09/05/16 1756  . MEDLINE mouth rinse  15 mL Mouth Rinse BID Oswald Hillock, MD   15 mL at 09/12/16 2200  . metoprolol succinate (TOPROL-XL) 24 hr tablet 100 mg  100 mg Oral Daily Ivor Costa, MD   100 mg at 09/13/16 1104  . multivitamin with minerals tablet 1 tablet  1 tablet Oral Daily Ivor Costa, MD   1 tablet at 09/13/16 1103  . mupirocin cream (BACTROBAN) 2 %   Topical Daily Theodis Blaze, MD      . nicotine (NICODERM CQ - dosed in mg/24 hours) patch 21 mg  21 mg Transdermal Daily Oswald Hillock, MD   21 mg at 09/13/16 1103  . nitroGLYCERIN (NITROSTAT) SL tablet 0.4 mg  0.4 mg Sublingual Q5 min PRN Ivor Costa, MD      . ondansetron [facility]) injection 4 mg  4 mg Intravenous Q6H PRN Ivor Costa, MD      . piperacillin-tazobactam (ZOSYN) IVPB 3.375 g  3.375 g Intravenous Q8H Rebecka Apley, RPH   3.375 g at 09/13/16 0826  . RESOURCE THICKENUP CLEAR   Oral PRN Oswald Hillock, MD      . sodium chloride flush (NS) 0.9 % injection 10-40 mL  10-40 mL Intracatheter Q12H Oswald Hillock, MD   10 mL at 09/13/16 1106  . sodium chloride flush (NS) 0.9 % injection 10-40 mL  10-40 mL Intracatheter PRN Oswald Hillock, MD      . thiamine (VITAMIN B-1) tablet 100 mg  100 mg Oral Daily Ivor Costa, MD   100 mg at 09/13/16 1102  . vancomycin (VANCOCIN) IVPB 1000 mg/200 mL premix  1,000 mg Intravenous Q24H Haze Boyden, RPH   1,000 mg at 09/12/16 1436     Discharge Medications: Please see discharge summary for a list of discharge medications.  Relevant Imaging Results:  Relevant Lab Results:   Additional Information [place], Anabelle Bungert B, LCSWA

## 2016-09-13 NOTE — Progress Notes (Signed)
Peripherally Inserted Central Catheter/Midline Placement  The IV Nurse has discussed with the patient and/or persons authorized to consent for the patient, the purpose of this procedure and the potential benefits and risks involved with this procedure.  The benefits include less needle sticks, lab draws from the catheter, and the patient may be discharged home with the catheter. Risks include, but not limited to, infection, bleeding, blood clot (thrombus formation), and puncture of an artery; nerve damage and irregular heartbeat and possibility to perform a PICC exchange if needed/ordered by physician.  Alternatives to this procedure were also discussed.  Bard Power PICC patient education guide, fact sheet on infection prevention and patient information card has been provided to patient /or left at bedside.  Spoke with dtr via telephone and obtained informed consent.  Pt agreeable to procedure, calm and cooperative.  PICC/Midline Placement Documentation  PICC Single Lumen [id number] PICC Left Basilic 45 cm 2 cm (Active)  Indication for Insertion or Continuance of Line Home intravenous therapies (PICC only) 09/13/2016  9:37 AM  Exposed Catheter (cm) 2 cm 09/13/2016  9:37 AM  Site Assessment Clean;Dry;Intact 09/13/2016  9:37 AM  Line Status Flushed;Saline locked;Blood return noted 09/13/2016  9:37 AM  Dressing Type Transparent 09/13/2016  9:37 AM  Dressing Status Clean;Dry;Intact;Antimicrobial disc in place 09/13/2016  9:37 AM  Line Care Connections checked and tightened 09/13/2016  9:37 AM  Line Adjustment (NICU/IV Team Only) No 09/13/2016  9:37 AM  Dressing Intervention New dressing 09/13/2016  9:37 AM  Dressing Change Due 09/20/16 09/13/2016  9:37 AM       Rolena Infante 09/13/2016, 9:38 AM

## 2016-09-13 NOTE — Progress Notes (Signed)
Spoke with Madlyn Frankel, dtr, on telephone.  She voices concerns regarding d/c plan and ability to care for him at home.  She refuses to give consent for PICC line at this time until she is able to speak with CSW and MD regarding care at home.  States she will be at phone number on chart today except during church.

## 2016-09-13 NOTE — Clinical Social Work Note (Signed)
CSW notified pt's dtr has decided to go with SNF vs. Home. CSW started SNF work up and will await bed offers to discuss with pt/family.  Cecia Egge B. Joline Maxcy Clinical Social Work Dept Weekend Social Worker [phone] 4:54 PM

## 2016-09-13 NOTE — Clinical Social Work Placement (Signed)
   CLINICAL SOCIAL WORK PLACEMENT  NOTE  Date:  09/13/2016  Patient Details  Name: KHADEN KIDDY MRN: [id number] Date of Birth: 16-Oct-1945  Clinical Social Work is seeking post-discharge placement for this patient at the [place] level of care (*CSW will initial, date and re-position this form in  chart as items are completed):  Yes   Patient/family provided with [facility] Work Department's list of facilities offering this level of care within the geographic area requested by the patient (or if unable, by the patient's family).  Yes   Patient/family informed of their freedom to choose among providers that offer the needed level of care, that participate in Medicare, Medicaid or managed care program needed by the patient, have an available bed and are willing to accept the patient.  Yes   Patient/family informed of [facility]'s ownership interest in [facility] and [facility], as well as of the fact that they are under no obligation to receive care at these facilities.  PASRR submitted to EDS on 09/13/16     PASRR number received on 09/13/16     Existing PASRR number confirmed on       FL2 transmitted to all facilities in geographic area requested by pt/family on 09/13/16     FL2 transmitted to all facilities within larger geographic area on       Patient informed that his/her managed care company has contracts with or will negotiate with certain facilities, including the following:            Patient/family informed of bed offers received.  Patient chooses bed at       Physician recommends and patient chooses bed at      Patient to be transferred to   on  .  Patient to be transferred to facility by       Patient family notified on   of transfer.  Name of family member notified:        PHYSICIAN       Additional Comment:    _______________________________________________ Serafina Mitchell, [place] 09/13/2016, 4:51 PM

## 2016-09-13 NOTE — Progress Notes (Signed)
[facility]  PROGRESS NOTE  Andrew [name] [id number] DOB: 1946/05/24 DOA: 08/30/2016 PCP: Woody Seller, MD   Brief HPI:   71 y.o. male with medical history significant of hypertension, hyperlipidemia, diabetes mellitus, anxiety, AAA premises 4.8 cm on MRI 04/09/15), right eye blindness, dCHF, who presented with chest pain, SOB and right elbow pain.   In the ED pt was found to have WBC 24.8, lactate of 4.85, troponin 0.07, acute renal injury with Cr 1.28, hyponatremia with sodium 129. Found to have LLL PNA and MRSA bacteremia secondary to olecranon bursitis of his right elbow with possible mitral valve vegetation per echo. On 1/23 pt was noted to have new fever, T Max of 100, with WBC increase to 28.2., and new LLL infiltrate on CXR. He has required increased oxygen support to NRB at 15 L.  Subjective   Patient seen and examined,picc line placed today.   Assessment/Plan:     1. Chest pain- patient presented with chest pain with positive troponin, CT angiogram was negative for PE and infiltration. Likely it was from demand ischemia in the setting of sepsis. Initial troponin was 0.07 -0.21. Cardiology was following plan for TEE but patient declined so cardiology has signed off. No need to further cycle troponins. Patient denies chest pain at this time. 2. Sepsis-secondary to right olecranon bursitis, patient met her to give for sepsis with elevated lactate, leukocytosis, tachycardia and tachypnea. ID was consulted and patient started on vancomycin. Stop date on 10/12/2016 per ID recommendations. Orthopedics was consulted and attempted aspirating but only trace blood obtained no pocket of fluid observed. Plan to keep right arm elevated. Patient had elevated white count ,  blood culture showed no growth, patient declined TEE , total 6 weeks of antibiotic therapy recommended. There was concern for progressive sepsis despite use of vancomycin so patient was transferred to the [facility]  service. At this time sepsis has resolved. WBC down to 13.5 3. Healthcare associated pneumonia, MRSA bacteremia-chest x-ray showed left lower lobe opacity concerning for pneumonia, patient is currently on vancomycin. Was started on Zosyn on 09/08/2016. We'll continue with both vancomycin and Zosyn. We'll stop Zosyn on 09/15/2016 after completing 7 days of therapy. 4. Dysphagia- there is suspected aspiration, patient had speech therapy evaluation. Started on dys 2 diet. 5. Tobacco abuse- started on nicotine patch. 6. Diabetes mellitus- started back on Lantus 10 units subcutaneous daily, sliding scale insulin with NovoLog 7. Chronic diastolic CHF- EF 01-60%, patient refused TEE. Currently euvolemic. 8. Essential hypertension-continue bloody pain, metoprolol. Blood pressure is well controlled. 9. AK I, hyponatremia- creatinine back to 0.81, baseline. Sodium still 131 likely chronic hyponatremia. 10. Pressure injury- 3 full thickness abrasions, left elbow; .3X.3cm, Left outer elbow .5X2.5X.1cm and .8X.5X.1cm. WOC recommendations is to use Bactroban to provide antimicrobial benefits and promote moist healing, foam dressing to protect from further injury. inner gluteal fold and sacrum is red and moist; appearance consistent with moisture associated skin damage. - recommend foam dressing to protect from further injury     DVT prophylaxis: Lovenox  Code Status: Full code  Family Communication: patient's daughter wants him to go to SNF, he does not have decision making capacity. Was seen by Psychiatry and deemed mentally incompetent to make medical decisions at this time.  Disposition Plan: SNF   Consultants:  PCCM  Procedures:  None     Antibiotics:   Anti-infectives    Start     Dose/Rate Route Frequency Ordered Stop   09/09/16 1500  vancomycin (VANCOCIN)  IVPB 1000 mg/200 mL premix     1,000 mg 200 mL/hr over 60 Minutes Intravenous Every 24 hours 09/09/16 0737     09/08/16 1130   aztreonam (AZACTAM) 2 g in dextrose 5 % 50 mL IVPB  Status:  Discontinued     2 g 100 mL/hr over 30 Minutes Intravenous  Once 09/08/16 1036 09/08/16 1051   09/08/16 1130  piperacillin-tazobactam (ZOSYN) IVPB 3.375 g     3.375 g 12.5 mL/hr over 240 Minutes Intravenous Every 8 hours 09/08/16 1051     09/02/16 2030  vancomycin (VANCOCIN) 1,250 mg in sodium chloride 0.9 % 250 mL IVPB  Status:  Discontinued     1,250 mg 166.7 mL/hr over 90 Minutes Intravenous Every 12 hours 09/02/16 2012 09/08/16 1018   09/01/16 0800  vancomycin (VANCOCIN) IVPB 750 mg/150 ml premix  Status:  Discontinued     750 mg 150 mL/hr over 60 Minutes Intravenous Every 12 hours 09/01/16 0726 09/02/16 2012   08/31/16 0800  vancomycin (VANCOCIN) 500 mg in sodium chloride 0.9 % 100 mL IVPB  Status:  Discontinued     500 mg 100 mL/hr over 60 Minutes Intravenous Every 12 hours 08/31/16 0739 09/01/16 0726   08/31/16 0730  vancomycin (VANCOCIN) IVPB 750 mg/150 ml premix  Status:  Discontinued     750 mg 150 mL/hr over 60 Minutes Intravenous Every 12 hours 08/30/16 2102 08/31/16 0739   08/31/16 0400  piperacillin-tazobactam (ZOSYN) IVPB 3.375 g  Status:  Discontinued     3.375 g 12.5 mL/hr over 240 Minutes Intravenous Every 8 hours 08/30/16 2102 08/31/16 1248   08/30/16 1830  piperacillin-tazobactam (ZOSYN) IVPB 3.375 g     3.375 g 100 mL/hr over 30 Minutes Intravenous  Once 08/30/16 1828 08/30/16 1945   08/30/16 1830  vancomycin (VANCOCIN) IVPB 1000 mg/200 mL premix     1,000 mg 200 mL/hr over 60 Minutes Intravenous  Once 08/30/16 1828 08/30/16 2008       Objective   Vitals:   09/13/16 0442 09/13/16 0730 09/13/16 0755 09/13/16 1126  BP: (!) 146/72  (!) 172/81   Pulse: 63 (!) 59  60  Resp: _0 Temp: 97.3 F (36.3 C)  97.4 F (36.3 C) 98.1 F (36.7 C)  TempSrc:   Oral Oral  SpO2: 96% (!) 88% 96% 98%  Weight: 74.4 kg (164 lb)     Height:        Intake/Output Summary (Last 24 hours) at 09/13/16 1304 Last  data filed at 09/13/16 0823  Gross per 24 hour  Intake               50 ml  Output              750 ml  Net             -700 ml   Filed Weights   09/11/16 0700 09/12/16 0522 09/13/16 0442  Weight: 73.9 kg (163 lb) 69.4 kg (153 lb) 74.4 kg (164 lb)     Physical Examination:  General exam: Appears calm and comfortable. Respiratory system: Clear to auscultation. Respiratory effort normal. Cardiovascular system:  RRR. No  murmurs, rubs, gallops. No pedal edema. GI system: Abdomen is nondistended, soft and nontender. No organomegaly.  Central nervous system. No focal neurological deficits. 5 x 5 power in all extremities. Skin: No rashes, lesions or ulcers. Psychiatry: Alert, oriented x 3.Lacks judgement and insight for decision making.    Data Reviewed: I have personally  reviewed following labs and imaging studies  CBG:  Recent Labs Lab 09/12/16 1953 09/13/16 0019 09/13/16 0441 09/13/16 0753 09/13/16 1124  GLUCAP 176* 104* 115* 138* 134*    CBC:  Recent Labs Lab 09/07/16 0600 09/08/16 0910 09/10/16 0416 09/11/16 0405  WBC 21.3* 28.2* 14.3* 13.5*  HGB 12.9* 12.4* 12.2* 11.7*  HCT 37.3* 36.1* 35.5* 33.9*  MCV 85.2 84.9 84.9 84.8  PLT 268 330 441* 456*    Basic Metabolic Panel:  Recent Labs Lab 09/07/16 0600 09/08/16 0910 09/09/16 0307 09/10/16 0416 09/11/16 0405  NA 127* 128* 130* 129* 131*  K 4.5 4.5 4.1 4.1 3.5  CL 94* 94* 95* 94* 93*  CO2 _0 GLUCOSE 63* 99 116* 327* 125*  BUN 20 22* 23* 20 15  CREATININE 0.78 0.81 0.75 0.81 0.57*  CALCIUM 8.1* 8.0* 7.9* 7.7* 8.0*  MG  --   --   --  1.8  --   PHOS  --   --   --  3.8  --     Recent Results (from the past 240 hour(s))  Culture, blood (routine x 2)     Status: None   Collection Time: 09/04/16  4:41 AM  Result Value Ref Range Status   Specimen Description BLOOD LEFT HAND  Final   Special Requests BOTTLES DRAWN AEROBIC AND ANAEROBIC 5CC  Final   Culture NO GROWTH 5 DAYS  Final    Report Status 09/09/2016 FINAL  Final  Culture, blood (routine x 2)     Status: None   Collection Time: 09/04/16  4:46 AM  Result Value Ref Range Status   Specimen Description BLOOD LEFT HAND  Final   Special Requests IN PEDIATRIC BOTTLE 4CC  Final   Culture NO GROWTH 5 DAYS  Final   Report Status 09/09/2016 FINAL  Final  Culture, blood (x 2)     Status: None   Collection Time: 09/08/16 11:48 AM  Result Value Ref Range Status   Specimen Description BLOOD LEFT ANTECUBITAL  Final   Special Requests BOTTLES DRAWN AEROBIC AND ANAEROBIC 10CC  Final   Culture NO GROWTH 5 DAYS  Final   Report Status 09/13/2016 FINAL  Final  Culture, blood (x 2)     Status: None   Collection Time: 09/08/16 11:48 AM  Result Value Ref Range Status   Specimen Description BLOOD BLOOD LEFT FOREARM  Final   Special Requests BOTTLES DRAWN AEROBIC ONLY 5CC  Final   Culture NO GROWTH 5 DAYS  Final   Report Status 09/13/2016 FINAL  Final  MRSA PCR Screening     Status: Abnormal   Collection Time: 09/08/16  2:19 PM  Result Value Ref Range Status   MRSA by PCR POSITIVE (A) NEGATIVE Final    Comment:        The GeneXpert MRSA Assay (FDA approved for NASAL specimens only), is one component of a comprehensive MRSA colonization surveillance program. It is not intended to diagnose MRSA infection nor to guide or monitor treatment for MRSA infections. RESULT CALLED TO, READ BACK BY AND VERIFIED WITH: D. Turner RN 17:35 09/08/16 ([email or web address])      Liver Function Tests: No results for input(s): AST, ALT, ALKPHOS, BILITOT, PROT, ALBUMIN in the last 168 hours. No results for input(s): LIPASE, AMYLASE in the last 168 hours. No results for input(s): AMMONIA in the last 168 hours.  Cardiac Enzymes: No results for input(s): CKTOTAL, CKMB, CKMBINDEX, TROPONINI in the last 168 hours. BNP (last  3 results)  Recent Labs  08/30/16 2216  BNP 292.7*    ProBNP (last 3 results) No results for input(s): PROBNP in the last 8760  hours.    Studies: No results found.  Scheduled Meds: . amLODipine  10 mg Oral Daily  . aspirin  325 mg Oral Daily  . atorvastatin  40 mg Oral q1800  . dextromethorphan-guaiFENesin  1 tablet Oral BID  . enoxaparin (LOVENOX) injection  40 mg Subcutaneous Q24H  . feeding supplement (ENSURE ENLIVE)  237 mL Oral Q24H  . feeding supplement (GLUCERNA SHAKE)  237 mL Oral BID PC  . insulin aspart  0-9 Units Subcutaneous Q4H  . insulin glargine  10 Units Subcutaneous QHS  . mouth rinse  15 mL Mouth Rinse BID  . metoprolol succinate  100 mg Oral Daily  . multivitamin with minerals  1 tablet Oral Daily  . mupirocin cream   Topical Daily  . nicotine  21 mg Transdermal Daily  . piperacillin-tazobactam (ZOSYN)  IV  3.375 g Intravenous Q8H  . sodium chloride flush  10-40 mL Intracatheter Q12H  . thiamine  100 mg Oral Daily  . vancomycin  1,000 mg Intravenous Q24H      Time spent: 25 min  [facility] Pager [phone]. If 7PM-7AM, please contact night-coverage at [email or web address], Office  [phone]  password [email or web address] 09/13/2016, 1:04 PM  LOS: 14 days

## 2016-09-14 LAB — GLUCOSE, CAPILLARY
GLUCOSE-CAPILLARY: 152 mg/dL — AB (ref 65–99)
GLUCOSE-CAPILLARY: 185 mg/dL — AB (ref 65–99)
GLUCOSE-CAPILLARY: 284 mg/dL — AB (ref 65–99)
Glucose-Capillary: 52 mg/dL — ABNORMAL LOW (ref 65–99)
Glucose-Capillary: 99 mg/dL (ref 65–99)
Glucose-Capillary: 276 mg/dL — ABNORMAL HIGH (ref 65–99)
Glucose-Capillary: 310 mg/dL — ABNORMAL HIGH (ref 65–99)
Glucose-Capillary: 143 mg/dL — ABNORMAL HIGH (ref 65–99)

## 2016-09-14 LAB — VANCOMYCIN, TROUGH: Vancomycin Tr: 10 ug/mL — ABNORMAL LOW (ref 15–20)

## 2016-09-14 MED ORDER — INSULIN ASPART 100 UNIT/ML ~~LOC~~ SOLN
0.0000 [IU] | Freq: Three times a day (TID) | SUBCUTANEOUS | Status: DC
  Administered 2016-09-14: 8 [IU] via SUBCUTANEOUS
  Administered 2016-09-15 (×2): 3 [IU] via SUBCUTANEOUS
  Administered 2016-09-15: 8 [IU] via SUBCUTANEOUS

## 2016-09-14 MED ORDER — INSULIN ASPART 100 UNIT/ML ~~LOC~~ SOLN: 0.0000 [IU] | Freq: Every day | SUBCUTANEOUS | Status: DC

## 2016-09-14 MED ORDER — VANCOMYCIN HCL IN DEXTROSE 1-5 GM/200ML-% IV SOLN
1000.0000 mg | INTRAVENOUS | Status: DC
  Administered 2016-09-14 – 2016-09-15 (×3): 1000 mg via INTRAVENOUS
  Filled 2016-09-14 (×2): qty 200

## 2016-09-14 MED ORDER — INSULIN GLARGINE 100 UNIT/ML ~~LOC~~ SOLN
7.0000 [IU] | Freq: Every day | SUBCUTANEOUS | Status: DC
  Administered 2016-09-14: 7 [IU] via SUBCUTANEOUS
  Filled 2016-09-14 (×2): qty 0.07

## 2016-09-14 NOTE — Progress Notes (Signed)
Nutrition Follow-up  DOCUMENTATION CODES:   Severe malnutrition in context of chronic illness  INTERVENTION:    Continue Glucerna Shake po BID, each supplement provides 220 kcal and 10 grams of protein  Continue Ensure Enlive HS, provides 350 kcal and 20 grams of protein  Continue MVI daily  NUTRITION DIAGNOSIS:   Malnutrition related to chronic illness as evidenced by severe depletion of body fat, severe depletion of muscle mass.  ongoing  GOAL:   Patient will meet greater than or equal to 90% of their needs  unmet  MONITOR:   PO intake, Supplement acceptance, Labs, Weight trends, Skin, I & O's  REASON FOR ASSESSMENT:   Low [place]    ASSESSMENT:   71 y.o. male with medical history significant of hypertension, hyperlipidemia, diabetes mellitus, ETOH abuse, anxiety, AAA premises 4.8 cm on MRI 04/09/15), right eye blindness, dCHF, who presents with chest pain, SOB and right elbow pain.  RN reports that patient has been drinking the Glucerna and Ensure Enlive supplements and the liquids from his meal trays. He is also eating the Magic Cups that are sent on his meal trays. RN has been giving patient thin liquids and reports he has been tolerating them well.  Likely being discharged to SNF soon. CBG's ranging from 52 to 276 mg/dL  Diet Order:  DIET DYS 2 Room service appropriate? Yes; Fluid consistency: Nectar Thick  Skin:  Wound (see comment) (abrasions and cellulitis on L elbow)  Last BM:  1/28  Height:   Ht Readings from Last 1 Encounters:  08/30/16 5\' 8"  (1.727 m)    Weight:   Wt Readings from Last 1 Encounters:  09/14/16 164 lb 8 oz (74.6 kg)   09/07/16 158 lb 3.2 oz (71.8 kg)   Ideal Body Weight:  70 kg  BMI:  Body mass index is 25.01 kg/m.  Estimated Nutritional Needs:   Kcal:  2000-2200  Protein:  90-100 grams  Fluid:  2 L/day  EDUCATION NEEDS:   No education needs identified at this time   Molli Barrows, [place], [place], [place] Pager  [phone] After Hours Pager [phone]

## 2016-09-14 NOTE — Clinical Social Work Note (Addendum)
Clinical Social Worker continuing to follow patient and family for support and discharge planning needs.  CSW attempted to contact patient daughter multiple times to provide bed offers with no return call at this time.  CSW did submit for insurance authorization with [facility] - requesting updated PT note.  CSW contacted acute therapies who will have PT see patient this afternoon or tomorrow morning.  CSW to continue to try and reach patient family to determine placement option.  CSW to facilitate patient discharge needs once insurance authorization received.  CSW to complete full assessment once able to have a conversation with patient daughter.  Barbette Or, [place]

## 2016-09-14 NOTE — Progress Notes (Signed)
[facility]  PROGRESS NOTE  Andrew [name] YWV:[id number] DOB: 1946/03/05 DOA: 08/30/2016 PCP: Woody Seller, MD   Brief HPI:   71 y.o. male with medical history significant of hypertension, hyperlipidemia, diabetes mellitus, anxiety, AAA premises 4.8 cm on MRI 04/09/15), right eye blindness, dCHF, who presented with chest pain, SOB and right elbow pain.   In the ED pt was found to have WBC 24.8, lactate of 4.85, troponin 0.07, acute renal injury with Cr 1.28, hyponatremia with sodium 129. Found to have LLL PNA and MRSA bacteremia secondary to olecranon bursitis of his right elbow with possible mitral valve vegetation per echo. On 1/23 pt was noted to have new fever, T Max of 100, with WBC increase to 28.2., and new LLL infiltrate on CXR. He has required increased oxygen support to NRB at 15 L.  Subjective   Patient seen and examined,picc line placed, awaiting discharge to skilled facility.   Assessment/Plan:     1. Chest pain- patient presented with chest pain with positive troponin, CT angiogram was negative for PE and infiltration. Likely it was from demand ischemia in the setting of sepsis. Initial troponin was 0.07 -0.21. Cardiology was following plan for TEE but patient declined so cardiology has signed off. No need to further cycle troponins. Patient denies chest pain at this time. 2. Sepsis-secondary to right olecranon bursitis, patient met her to give for sepsis with elevated lactate, leukocytosis, tachycardia and tachypnea. ID was consulted and patient started on vancomycin. Stop date on 10/12/2016 per ID recommendations. Orthopedics was consulted and attempted aspirating but only trace blood obtained no pocket of fluid observed. Plan to keep right arm elevated. Patient had elevated white count ,  blood culture showed no growth, patient declined TEE , total 6 weeks of antibiotic therapy recommended. There was concern for progressive sepsis despite use of vancomycin so patient  was transferred to the [facility] service. At this time sepsis has resolved. WBC down to 13.5 3. Healthcare associated pneumonia, MRSA bacteremia-chest x-ray showed left lower lobe opacity concerning for pneumonia, patient is currently on vancomycin. Was started on Zosyn on 09/08/2016. We'll continue with both vancomycin and Zosyn. We'll stop Zosyn on 09/15/2016 after completing 7 days of therapy. 4. Dysphagia- there is suspected aspiration, patient had speech therapy evaluation. Started on dys 2 diet. 5. Tobacco abuse- started on nicotine patch. 6. Diabetes mellitus- started back on Lantus 10 units subcutaneous daily, sliding scale insulin with NovoLog. Patient having labile blood glucose, will change Lantus to 7 units subcutaneous daily. Moderate sliding scale insulin. 7. Chronic diastolic CHF- EF 85-46%, patient refused TEE. Currently euvolemic. 8. Essential hypertension-continue bloody pain, metoprolol. Blood pressure is well controlled. 9. AK I, hyponatremia- creatinine back to 0.81, baseline. Sodium still 131 likely chronic hyponatremia. 10. Pressure injury- 3 full thickness abrasions, left elbow; .3X.3cm, Left outer elbow .5X2.5X.1cm and .8X.5X.1cm. WOC recommendations is to use Bactroban to provide antimicrobial benefits and promote moist healing, foam dressing to protect from further injury. inner gluteal fold and sacrum is red and moist; appearance consistent with moisture associated skin damage. - recommend foam dressing to protect from further injury     DVT prophylaxis: Lovenox  Code Status: Full code  Family Communication: patient's daughter wants him to go to SNF, he does not have decision making capacity. Was seen by Psychiatry and deemed mentally incompetent to make medical decisions at this time.  Disposition Plan: SNF   Consultants:  PCCM  Procedures:  None     Antibiotics:  Anti-infectives    Start     Dose/Rate Route Frequency Ordered Stop   09/14/16 1603   vancomycin (VANCOCIN) IVPB 1000 mg/200 mL premix     1,000 mg 200 mL/hr over 60 Minutes Intravenous Every 18 hours 09/14/16 1603     09/09/16 1500  vancomycin (VANCOCIN) IVPB 1000 mg/200 mL premix  Status:  Discontinued     1,000 mg 200 mL/hr over 60 Minutes Intravenous Every 24 hours 09/09/16 0737 09/14/16 1603   09/08/16 1130  aztreonam (AZACTAM) 2 g in dextrose 5 % 50 mL IVPB  Status:  Discontinued     2 g 100 mL/hr over 30 Minutes Intravenous  Once 09/08/16 1036 09/08/16 1051   09/08/16 1130  piperacillin-tazobactam (ZOSYN) IVPB 3.375 g     3.375 g 12.5 mL/hr over 240 Minutes Intravenous Every 8 hours 09/08/16 1051     09/02/16 2030  vancomycin (VANCOCIN) 1,250 mg in sodium chloride 0.9 % 250 mL IVPB  Status:  Discontinued     1,250 mg 166.7 mL/hr over 90 Minutes Intravenous Every 12 hours 09/02/16 2012 09/08/16 1018   09/01/16 0800  vancomycin (VANCOCIN) IVPB 750 mg/150 ml premix  Status:  Discontinued     750 mg 150 mL/hr over 60 Minutes Intravenous Every 12 hours 09/01/16 0726 09/02/16 2012   08/31/16 0800  vancomycin (VANCOCIN) 500 mg in sodium chloride 0.9 % 100 mL IVPB  Status:  Discontinued     500 mg 100 mL/hr over 60 Minutes Intravenous Every 12 hours 08/31/16 0739 09/01/16 0726   08/31/16 0730  vancomycin (VANCOCIN) IVPB 750 mg/150 ml premix  Status:  Discontinued     750 mg 150 mL/hr over 60 Minutes Intravenous Every 12 hours 08/30/16 2102 08/31/16 0739   08/31/16 0400  piperacillin-tazobactam (ZOSYN) IVPB 3.375 g  Status:  Discontinued     3.375 g 12.5 mL/hr over 240 Minutes Intravenous Every 8 hours 08/30/16 2102 08/31/16 1248   08/30/16 1830  piperacillin-tazobactam (ZOSYN) IVPB 3.375 g     3.375 g 100 mL/hr over 30 Minutes Intravenous  Once 08/30/16 1828 08/30/16 1945   08/30/16 1830  vancomycin (VANCOCIN) IVPB 1000 mg/200 mL premix     1,000 mg 200 mL/hr over 60 Minutes Intravenous  Once 08/30/16 1828 08/30/16 2008       Objective   Vitals:   09/14/16 0539  09/14/16 0806 09/14/16 1120 09/14/16 1500  BP: (!) 133/54     Pulse: (!) 54 (!) 56 64 (!) 59  Resp:   18 17  Temp: 98.7 F (37.1 C) 97.9 F (36.6 C) 98.3 F (36.8 C) 98.1 F (36.7 C)  TempSrc: Oral Oral Oral Oral  SpO2: 93% 92% 92% 94%  Weight: 74.6 kg (164 lb 8 oz)     Height:        Intake/Output Summary (Last 24 hours) at 09/14/16 1614 Last data filed at 09/14/16 1442  Gross per 24 hour  Intake             1300 ml  Output             1350 ml  Net              -50 ml   Filed Weights   09/12/16 0522 09/13/16 0442 09/14/16 0539  Weight: 69.4 kg (153 lb) 74.4 kg (164 lb) 74.6 kg (164 lb 8 oz)     Physical Examination:  General exam: Appears calm and comfortable. Respiratory system: Clear to auscultation. Respiratory effort normal. Cardiovascular  system:  RRR. No  murmurs, rubs, gallops. No pedal edema. GI system: Abdomen is nondistended, soft and nontender. No organomegaly.  Central nervous system. No focal neurological deficits. 5 x 5 power in all extremities. Skin: No rashes, lesions or ulcers. Psychiatry: Alert, oriented x 3.Lacks judgement and insight for decision making.    Data Reviewed: I have personally reviewed following labs and imaging studies  CBG:  Recent Labs Lab 09/14/16 0537 09/14/16 0612 09/14/16 0749 09/14/16 1113 09/14/16 1523  GLUCAP 52* 99 185* 276* 310*    CBC:  Recent Labs Lab 09/08/16 0910 09/10/16 0416 09/11/16 0405  WBC 28.2* 14.3* 13.5*  HGB 12.4* 12.2* 11.7*  HCT 36.1* 35.5* 33.9*  MCV 84.9 84.9 84.8  PLT 330 441* 456*    Basic Metabolic Panel:  Recent Labs Lab 09/08/16 0910 09/09/16 0307 09/10/16 0416 09/11/16 0405  NA 128* 130* 129* 131*  K 4.5 4.1 4.1 3.5  CL 94* 95* 94* 93*  CO2 25 24 23 27   GLUCOSE 99 116* 327* 125*  BUN 22* 23* 20 15  CREATININE 0.81 0.75 0.81 0.57*  CALCIUM 8.0* 7.9* 7.7* 8.0*  MG  --   --  1.8  --   PHOS  --   --  3.8  --     Recent Results (from the past 240 hour(s))  Culture,  blood (x 2)     Status: None   Collection Time: 09/08/16 11:48 AM  Result Value Ref Range Status   Specimen Description BLOOD LEFT ANTECUBITAL  Final   Special Requests BOTTLES DRAWN AEROBIC AND ANAEROBIC 10CC  Final   Culture NO GROWTH 5 DAYS  Final   Report Status 09/13/2016 FINAL  Final  Culture, blood (x 2)     Status: None   Collection Time: 09/08/16 11:48 AM  Result Value Ref Range Status   Specimen Description BLOOD BLOOD LEFT FOREARM  Final   Special Requests BOTTLES DRAWN AEROBIC ONLY 5CC  Final   Culture NO GROWTH 5 DAYS  Final   Report Status 09/13/2016 FINAL  Final  MRSA PCR Screening     Status: Abnormal   Collection Time: 09/08/16  2:19 PM  Result Value Ref Range Status   MRSA by PCR POSITIVE (A) NEGATIVE Final    Comment:        The GeneXpert MRSA Assay (FDA approved for NASAL specimens only), is one component of a comprehensive MRSA colonization surveillance program. It is not intended to diagnose MRSA infection nor to guide or monitor treatment for MRSA infections. RESULT CALLED TO, READ BACK BY AND VERIFIED WITH: D. Turner RN 17:35 09/08/16 ([email or web address])      Liver Function Tests: No results for input(s): AST, ALT, ALKPHOS, BILITOT, PROT, ALBUMIN in the last 168 hours. No results for input(s): LIPASE, AMYLASE in the last 168 hours. No results for input(s): AMMONIA in the last 168 hours.  Cardiac Enzymes: No results for input(s): CKTOTAL, CKMB, CKMBINDEX, TROPONINI in the last 168 hours. BNP (last 3 results)  Recent Labs  08/30/16 2216  BNP 292.7*    ProBNP (last 3 results) No results for input(s): PROBNP in the last 8760 hours.    Studies: No results found.  Scheduled Meds: . amLODipine  10 mg Oral Daily  . aspirin  325 mg Oral Daily  . atorvastatin  40 mg Oral q1800  . dextromethorphan-guaiFENesin  1 tablet Oral BID  . enoxaparin (LOVENOX) injection  40 mg Subcutaneous Q24H  . feeding supplement (ENSURE ENLIVE)  237 mL  Oral Q24H  . feeding  supplement (GLUCERNA SHAKE)  237 mL Oral BID PC  . insulin aspart  0-9 Units Subcutaneous Q4H  . insulin glargine  7 Units Subcutaneous QHS  . mouth rinse  15 mL Mouth Rinse BID  . metoprolol succinate  100 mg Oral Daily  . multivitamin with minerals  1 tablet Oral Daily  . mupirocin cream   Topical Daily  . nicotine  21 mg Transdermal Daily  . piperacillin-tazobactam (ZOSYN)  IV  3.375 g Intravenous Q8H  . sodium chloride flush  10-40 mL Intracatheter Q12H  . thiamine  100 mg Oral Daily  . vancomycin  1,000 mg Intravenous Q18H      Time spent: 25 min  [facility] Pager [phone]. If 7PM-7AM, please contact night-coverage at [email or web address], Office  [phone]  password [email or web address] 09/14/2016, 4:14 PM  LOS: 15 days

## 2016-09-14 NOTE — Progress Notes (Signed)
Speech Language Pathology Treatment: Dysphagia  Patient Details Name: Andrew [name] MRN: [id number] DOB: Sep 02, 1945 Today's Date: 09/14/2016 Time: 0950-1007 SLP Time Calculation (min) (ACUTE ONLY): 17 min  Assessment / Plan / Recommendation Clinical Impression  Pt pleasant today.  Consumed magic cup and two nectar-thick drinks from breakfast tray with good toleration; no s/s of aspiration.  Refused trials of thin liquids.  Afebrile; lungs diminished but clear.  Awaiting D/C to SNF - will benefit from SLP f/u at D/C for diet progression.     HPI HPI: 71 y.o.malewith hypertension, hyperlipidemia, diabetes mellitus, anxiety, AAA 4.8 cm on MRI (04/09/15), right eye blindness, anddCHF. Presented to the ED on 08/30/2016 with two days chest pain, SOB and right elbow pain. In the ED pt was found to have WBC 24.8, lactate of 4.85, troponin 0.07, acute renal injury with Cr 1.28, hyponatremia with sodium 129. Found to have LLL PNA and MRSA bacteremia secondary to olecranon bursitis of his right elbow withpossible mitral valve vegetation per echo. Clinical swallow eval on 1/17 with recs for regular diet, thin liquds.  On 1/23 pt was noted to have new fever, T Max of 100, with WBC increase to 28.2., and new LLL infiltrate on CXR. He has required increased oxygen support to NRB at 15 L.Pt transfered to ICU same date.  Has been refusing oxygen, further testing, as well as any discussions re: code status.  SLP reconsulted for formal swallowing evaluation due to ongoing aspiration concerns.       SLP Plan  Continue with current plan of care     Recommendations  Diet recommendations: Dysphagia 2 (fine chop);Nectar-thick liquid Liquids provided via: Cup;Straw Medication Administration: Whole meds with liquid Supervision: Patient able to self feed Compensations: Slow rate;Small sips/bites                Oral Care Recommendations: Oral care BID Follow up Recommendations: Skilled Nursing  facility Plan: Continue with current plan of care       GO                [name] 09/14/2016, 10:07 AM

## 2016-09-14 NOTE — Progress Notes (Signed)
Hypoglycemic Event  CBG: 52  Treatment: 15 grams carbs  Symptoms: Asymptomatic Follow-up CBG: [id number] Possible Reasons for Event: low food intake Comments/MD notified:no     Andrew [name] N Bane Hagy

## 2016-09-14 NOTE — Progress Notes (Signed)
Occupational Therapy Treatment Patient Details Name: Andrew [name] MRN: [id number] DOB: July 29, 1946 Today's Date: 09/14/2016    History of present illness Pt admitted with chest pain, fever, SOB and R elbow pain. + for MRSA bacteremia and R elbow cellulits. PMH: DM, AAA, CHF, HTN, blindness in R eye, anxiety and presumed endocarditis.    OT comments  Pt progressing toward goals. Able to stand pivot to [facility] and to chair with +32min A. Pt incontinent of BM x2 during session. Continue to recommend SNF for rehab.   Follow Up Recommendations  SNF;Supervision/Assistance - 24 hour    Equipment Recommendations  3 in 1 bedside commode    Recommendations for Other Services      Precautions / Restrictions Precautions Precautions: Fall Precaution Comments: wound R elbow       Mobility Bed Mobility Overal bed mobility: Needs Assistance Bed Mobility: Supine to Sit Rolling: Min assist            Transfers Overall transfer level: Needs assistance   Transfers: Sit to/from Stand;Stand Pivot Transfers Sit to Stand: Min assist Stand pivot transfers: Min assist;+2 physical assistance (tokk several steps to chair)            Balance Overall balance assessment: Needs assistance   Sitting balance-Leahy Scale: Fair       Standing balance-Leahy Scale: Poor                     ADL Overall ADL's : Needs assistance/impaired     Grooming: Minimal assistance Grooming Details (indicate cue type and reason): washing hands/face with set up                 Toilet Transfer: Minimal assistance;+2 for physical assistance;+2 for safety/equipment;Stand-pivot   Toileting- Clothing Manipulation and Hygiene: Total assistance Toileting - Clothing Manipulation Details (indicate cue type and reason): incontinent in bed. claeaned up. tranferred to chair then to [facility]. Pt asking to use toilet     Functional mobility during ADLs: Minimal assistance;+2 for physical assistance         Vision                     Perception     Praxis      Cognition   Behavior During Therapy: Flat affect Overall Cognitive Status: No family/caregiver present to determine baseline cognitive functioning                  General Comments: following comands. Poor insight into safety/judgment adn awareness    Extremity/Trunk Assessment               Exercises     Shoulder Instructions       General Comments      Pertinent Vitals/ Pain       Pain Assessment: Faces Faces Pain Scale: Hurts little more Pain Location: butt when wiping Pain Descriptors / Indicators: Grimacing Pain Intervention(s): Limited activity within patient's tolerance  Home Living                                          Prior Functioning/Environment              Frequency  Min 2X/week        Progress Toward Goals  OT Goals(current goals can now be found in the care plan section)  Progress towards OT goals:  Progressing toward goals  Acute Rehab OT Goals Patient Stated Goal: to go home OT Goal Formulation: With patient Time For Goal Achievement: 09/15/16 Potential to Achieve Goals: Good ADL Goals Pt Will Perform Eating: with set-up;sitting Pt Will Perform Grooming: with min assist;standing Pt Will Perform Upper Body Dressing: with min assist;sitting Pt Will Perform Lower Body Dressing: with min assist;sit to/from stand Pt Will Transfer to Toilet: with min assist;ambulating;bedside commode Pt Will Perform Toileting - Clothing Manipulation and hygiene: with min assist;sit to/from stand Pt/caregiver will Perform Home Exercise Program: Increased ROM;Right Upper extremity;With minimal assist Additional ADL Goal #1: Pt will recall edema management techniques for R UE.  Plan Discharge plan remains appropriate    Co-evaluation                 End of Session Equipment Utilized During Treatment: Gait belt   Activity Tolerance Patient  tolerated treatment well   Patient Left in chair;with call bell/phone within reach;with chair alarm set   Nurse Communication Mobility status        Time: [id number] OT Time Calculation (min): 38 min  Charges: OT General Charges $OT Visit: 1 Procedure OT Treatments $Self Care/Home Management : 38-52 mins  [name],[name] 09/14/2016, 2:49 PM   [facility], OT/L  [phone] 09/14/2016

## 2016-09-14 NOTE — Progress Notes (Signed)
Pharmacy Antibiotic Note  Andrew [name] is a 71 y.o. male admitted on 08/30/2016 with MRSA bacteremia, R-elbow bursitis, and HCAP.  Pharmacy has been consulted for vancomycin and Zosyn dosing.  Vancomycin trough is low after dose reduction to 1g IV every 24 hours.  Patient missed a dose on 1/25 but has had scheduled doses prior to this level indicating this is a true trough and drawn on time.  SCr is 0.57 on last check on 1/26. Will recheck BMET in AM.    Plan: Increase Vancomycin to 1g IV every 18 hours for goal trough of 15-20 mcg/mL Continue Zosyn 3.375g IV every 8 hours.  Monitor for clinical improvement, renal function, and discharge plans.   Height: 5\' 8"  (172.7 cm) Weight: 164 lb 8 oz (74.6 kg) IBW/kg (Calculated) : 68.4  Temp (24hrs), Avg:98.3 F (36.8 C), Min:97.9 F (36.6 C), Max:98.7 F (37.1 C)   Recent Labs Lab 09/08/16 0910 09/08/16 1148 09/08/16 1453 09/08/16 2118 09/09/16 0307 09/10/16 0416 09/11/16 0405 09/14/16 1457  WBC 28.2*  --   --   --   --  14.3* 13.5*  --   CREATININE 0.81  --   --   --  0.75 0.81 0.57*  --   LATICACIDVEN  --  1.1 0.9  --   --   --   --   --   VANCOTROUGH 36*  --   --   --   --   --   --  10*  VANCORANDOM  --   --   --  25 21  --   --   --     Estimated Creatinine Clearance: 81.9 mL/min (by C-G formula based on SCr of 0.57 mg/dL (L)).    Allergies  Allergen Reactions  . Penicillins Rash and Other (See Comments)    Tolerated Zosyn Jan 2018 Possibly rash? Has patient had a PCN reaction causing immediate rash, facial/tongue/throat swelling, SOB or lightheadedness with hypotension: YES Has patient had a PCN reaction causing severe rash involving mucus membranes or skin necrosis:NO Has patient had a PCN reaction that required hospitalization NO Has patient had a PCN reaction occurring within the last 10 years:NO If all of the above answers are "NO", then may proceed with Cephalosporin use.    Antimicrobials this admission:   Vanc 1/14 >> (2/26)  Zosyn 1/14 >> 1/15, 1/23>>(1/30)  Dose adjustments this admission:  1/17 VT = 10 mcg/mL on 750mg  q12 > 1250mg  q12 (SCr 0.53) 1/19 VT = 24 mcg/mL on 1250mg  q12 (drawn ~8 hrs post last dose, true trough ~18 mcg/mL), no change for now 1/23 VT = 36 but drawn early - still high though (SCr 0.81) 1/23 PM VR = 25, changed to 1g Q24H, est T = 19.85, est pk 35.81.  1/29 VT = 10 after 3 consistent doses of 1g IV Q24H (SCr 0.57) >> Increase to 750mg  IV q12h   Microbiology results:  1/14 BCx x2 - 2/2 MRSA 1/14 UCx - negative 1/16 BCx x2 - 1/2 MRSA 1/19 BCx x2 - negative 1/23 MRSA PCR: positive 1/23 BCx: negative  Thank you for allowing pharmacy to be a part of this patient's care.  Sloan Leiter, PharmD, BCPS Clinical Pharmacist Clinical Phone 09/14/2016 until 4 PM [phone] After hours, please call #[phone] 09/14/2016 3:35 PM

## 2016-09-14 NOTE — Progress Notes (Signed)
Inpatient Diabetes Program Recommendations  AACE/ADA: New Consensus Statement on Inpatient Glycemic Control (2015)  Target Ranges:  Prepandial:   less than 140 mg/dL      Peak postprandial:   less than 180 mg/dL (1-2 hours)      Critically ill patients:  140 - 180 mg/dL   Results for Andrew [name], Andrew [name] (MRN [id number]) as of 09/14/2016 08:16  Ref. Range 09/13/2016 00:19 09/13/2016 04:41 09/13/2016 07:53 09/13/2016 11:24 09/13/2016 16:46 09/13/2016 19:52  Glucose-Capillary Latest Ref Range: 65 - 99 mg/dL 104 (H) 115 (H) 138 (H) 134 (H) 204 (H) 254 (H)   Results for Andrew [name], Andrew [name] (MRN [id number]) as of 09/14/2016 08:16  Ref. Range 09/14/2016 00:24 09/14/2016 05:37 09/14/2016 06:12 09/14/2016 07:49  Glucose-Capillary Latest Ref Range: 65 - 99 mg/dL 152 (H) 52 (L) 99 185 (H)    Home DM Meds: Toujeo 60 units daily                             Metformin 1000 mg BID                             Januvia 100 mg daily  Current Insulin Orders: Novolog Sensitive Correction Scale/ SSI (0-9 units) Q4 hours      Lantus 10 units QHS       MD- Note patient had issues with elevated CBGs late yesterday afternoon and then had Hypoglycemic event this AM.  Please consider the following:  1. Decrease Lantus to 7 units QHS  2. Change Novolog SSI coverage to: Novolog Moderate Correction Scale/ SSI (0-15 units) TID AC + HS (currently ordered Sensitive scale 0-9 units Q4 hours)      --Will follow patient during hospitalization--  Wyn Quaker RN, MSN, CDE Diabetes Coordinator Inpatient Glycemic Control Team Team Pager: [phone] (8a-5p)

## 2016-09-15 DIAGNOSIS — E43 Unspecified severe protein-calorie malnutrition: Secondary | ICD-10-CM

## 2016-09-15 LAB — GLUCOSE, CAPILLARY
GLUCOSE-CAPILLARY: 176 mg/dL — AB (ref 65–99)
GLUCOSE-CAPILLARY: 286 mg/dL — AB (ref 65–99)
Glucose-Capillary: 179 mg/dL — ABNORMAL HIGH (ref 65–99)

## 2016-09-15 LAB — CREATININE, SERUM
CREATININE: 0.54 mg/dL — AB (ref 0.61–1.24)
GFR calc Af Amer: >60 mL/min (ref >60)
GFR calc non Af Amer: >60 mL/min (ref >60)

## 2016-09-15 MED ORDER — VANCOMYCIN IV (FOR PTA / DISCHARGE USE ONLY): 1000.0000 mg | INTRAVENOUS | 0 refills | Status: DC

## 2016-09-15 MED ORDER — ENSURE ENLIVE PO LIQD
237.0000 mL | ORAL | 12 refills | Status: DC
Start: 2016-09-15 — End: 2017-10-22

## 2016-09-15 MED ORDER — THIAMINE HCL 100 MG PO TABS: 100.0000 mg | ORAL_TABLET | Freq: Every day | ORAL | 2 refills | Status: AC

## 2016-09-15 MED ORDER — VANCOMYCIN HCL 500 MG IV SOLR
500.0000 mg | Freq: Once | INTRAVENOUS | Status: AC
  Administered 2016-09-15: 500 mg via INTRAVENOUS
  Filled 2016-09-15: qty 500

## 2016-09-15 MED ORDER — VANCOMYCIN HCL IN DEXTROSE 750-5 MG/150ML-% IV SOLN
750.0000 mg | Freq: Two times a day (BID) | INTRAVENOUS | Status: DC
  Filled 2016-09-15: qty 150

## 2016-09-15 MED ORDER — INSULIN GLARGINE 100 UNIT/ML ~~LOC~~ SOLN: 7.0000 [IU] | Freq: Every day | SUBCUTANEOUS | 11 refills | Status: DC

## 2016-09-15 MED ORDER — VANCOMYCIN HCL IN DEXTROSE 750-5 MG/150ML-% IV SOLN: 750.0000 mg | Freq: Two times a day (BID) | INTRAVENOUS | Status: DC

## 2016-09-15 MED ORDER — VANCOMYCIN IV (FOR PTA / DISCHARGE USE ONLY): 750.0000 mg | Freq: Two times a day (BID) | INTRAVENOUS | 0 refills | Status: DC

## 2016-09-15 MED ORDER — HEPARIN SOD (PORK) LOCK FLUSH 100 UNIT/ML IV SOLN
250.0000 [IU] | INTRAVENOUS | Status: AC | PRN
  Administered 2016-09-15: 250 [IU]
  Filled 2016-09-15: qty 3

## 2016-09-15 MED ORDER — METOPROLOL SUCCINATE ER 100 MG PO TB24: 100.0000 mg | ORAL_TABLET | Freq: Every day | ORAL | 2 refills | Status: DC

## 2016-09-15 MED ORDER — INSULIN ASPART 100 UNIT/ML ~~LOC~~ SOLN: SUBCUTANEOUS | 11 refills | Status: DC
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
## 2016-09-15 NOTE — Progress Notes (Signed)
Pt and family provided discharge instructions. All questions and concerns answered. Pt escorted to families vehicle by wheelchair. Family assisted patient to vehicle.

## 2016-09-15 NOTE — Progress Notes (Signed)
Pharmacy Antibiotic Note  Andrew [name] is a 71 y.o. male admitted on 08/30/2016 with MRSA bacteremia, R-elbow bursitis, and HCAP.  Pharmacy has been consulted for vancomycin dosing.  **Patient is being discharged today on vancomycin and will require a regular dosing interval for facility.  SCr is 0.54    Plan: Change Vancomycin to 750 mg IV every 12 hours for goal trough of 15-20 mcg/mL Monitor for clinical improvement, renal function, and discharge plans.   Height: 5\' 8"  (172.7 cm) Weight: 147 lb (66.7 kg) IBW/kg (Calculated) : 68.4  Temp (24hrs), Avg:98 F (36.7 C), Min:97.5 F (36.4 C), Max:98.4 F (36.9 C)   Recent Labs Lab 09/08/16 1453 09/08/16 2118 09/09/16 0307 09/10/16 0416 09/11/16 0405 09/14/16 1457 09/15/16 0642  WBC  --   --   --  14.3* 13.5*  --   --   CREATININE  --   --  0.75 0.81 0.57*  --  0.54*  LATICACIDVEN 0.9  --   --   --   --   --   --   VANCOTROUGH  --   --   --   --   --  10*  --   VANCORANDOM  --  25 21  --   --   --   --     Estimated Creatinine Clearance: 79.9 mL/min (by C-G formula based on SCr of 0.54 mg/dL (L)).    Allergies  Allergen Reactions  . Penicillins Rash and Other (See Comments)    Tolerated Zosyn Jan 2018 Possibly rash? Has patient had a PCN reaction causing immediate rash, facial/tongue/throat swelling, SOB or lightheadedness with hypotension: YES Has patient had a PCN reaction causing severe rash involving mucus membranes or skin necrosis:NO Has patient had a PCN reaction that required hospitalization NO Has patient had a PCN reaction occurring within the last 10 years:NO If all of the above answers are "NO", then may proceed with Cephalosporin use.    Antimicrobials this admission:  Vanc 1/14 >> (2/26)  Zosyn 1/14 >> 1/15, 1/23>>(1/30)  Dose adjustments this admission:  1/17 VT = 10 mcg/mL on 750mg  q12 > 1250mg  q12 (SCr 0.53) 1/19 VT = 24 mcg/mL on 1250mg  q12 (drawn ~8 hrs post last dose, true trough ~18  mcg/mL), no change for now 1/23 VT = 36 but drawn early - still high though (SCr 0.81) 1/23 PM VR = 25, changed to 1g Q24H, est T = 19.85, est pk 35.81.  1/29 VT = 10 after 3 consistent doses of 1g IV Q24H (SCr 0.57) >> Increase to 750mg  IV q12h   Microbiology results:  1/14 BCx x2 - 2/2 MRSA 1/14 UCx - negative 1/16 BCx x2 - 1/2 MRSA 1/19 BCx x2 - negative 1/23 MRSA PCR: positive 1/23 BCx: negative   Thank you for allowing [place] to participate in this patients care.  Jens Som, PharmD Clinical phone for 09/15/2016 from 7a-3:30p: [phone] If after 3:30p, please call main pharmacy at: [phone] 09/15/2016 12:42 PM

## 2016-09-15 NOTE — Progress Notes (Signed)
PT Cancellation Note  Patient Details Name: AMARE CHESMORE MRN: [id number] DOB: 05/27/1946   Cancelled Treatment:    Reason Eval/Treat Not Completed: Patient refusing to participate with PT despite encouragement. Declines PT checking back at a later time as well. Nursing notified.    Cassell Clement, PT, CSCS Pager [phone] Office [phone]  09/15/2016, 10:48 AM

## 2016-09-15 NOTE — Clinical Social Work Note (Signed)
Per handoff from [facility], the patient is discharging home and daughter is aware. CSW signing off at this time.  Liz Beach MSW, [place], [place], [id number]

## 2016-09-15 NOTE — Progress Notes (Signed)
Inpatient Diabetes Program Recommendations  AACE/ADA: New Consensus Statement on Inpatient Glycemic Control (2015)  Target Ranges:  Prepandial:   less than 140 mg/dL      Peak postprandial:   less than 180 mg/dL (1-2 hours)      Critically ill patients:  140 - 180 mg/dL   Results for Andrew [name], Andrew [name] (MRN [id number]) as of 09/15/2016 12:25  Ref. Range 09/15/2016 07:49 09/15/2016 11:25  Glucose-Capillary Latest Ref Range: 65 - 99 mg/dL 176 (H) 286 (H)    Home DM Meds: Toujeo 60 units daily       Metformin 1000 mg BID       Januvia 100 mg daily      Current Orders: Lantus 7 units QHS       Novolog Moderate Correction Scale/ SSI (0-15 units) TID AC + HS       MD- Note patient had Hypoglycemia yesterday AM.  Lantus reduced to 7 units QHS as a result.  Fasting glucose better this AM: 176 mg/dl.  Patient continues to have elevated postprandial glucose levels.  Please consider starting Novolog Meal Coverage: Novolog 3 units TID with meals to start (hold if pt eats <50% of meal)     --Will follow patient during hospitalization--  Wyn Quaker RN, MSN, CDE Diabetes Coordinator Inpatient Glycemic Control Team Team Pager: [phone] (8a-5p)

## 2016-09-15 NOTE — Progress Notes (Signed)
NCM spoke to [facility] Liaison and Home IV abx arranged. Spoke to dtr, [place]. Requesting RW and 3N1 bedside commode for home. Contacted [facility] DME rep for equipment. Delivered to room. Jonnie Finner RN CCM Case Mgmt phone [phone]

## 2016-09-21 ENCOUNTER — Other Ambulatory Visit (HOSPITAL_COMMUNITY)
Admission: RE | Admit: 2016-09-21 | Discharge: 2016-09-21 | Disposition: A | Discharge status: Home / Self Care | Payer: Medicare Other | Source: Other Acute Inpatient Hospital | Attending: Family Medicine | Admitting: Family Medicine

## 2016-09-21 DIAGNOSIS — M8609 Acute hematogenous osteomyelitis, multiple sites: Secondary | ICD-10-CM | POA: Insufficient documentation

## 2016-09-21 LAB — CBC WITH DIFFERENTIAL/PLATELET
BASOS ABS: 0.2 10*3/uL — AB (ref 0.0–0.1)
Basophils Relative: 1 %
Eosinophils Absolute: 0.3 10*3/uL (ref 0.0–0.7)
Eosinophils Relative: 2 %
HEMATOCRIT: 33.4 % — AB (ref 39.0–52.0)
Hemoglobin: 11.1 g/dL — ABNORMAL LOW (ref 13.0–17.0)
LYMPHS ABS: 1.9 10*3/uL (ref 0.7–4.0)
LYMPHS PCT: 11 %
MCH: 29.1 pg (ref 26.0–34.0)
MCHC: 33.2 g/dL (ref 30.0–36.0)
MCV: 87.4 fL (ref 78.0–100.0)
MONO ABS: 2 10*3/uL — AB (ref 0.1–1.0)
Monocytes Relative: 12 %
NEUTROS ABS: 13.4 10*3/uL — AB (ref 1.7–7.7)
Neutrophils Relative %: 74 %
Platelets: 690 10*3/uL — ABNORMAL HIGH (ref 150–400)
RBC: 3.82 MIL/uL — ABNORMAL LOW (ref 4.22–5.81)
RDW: 14.1 % (ref 11.5–15.5)
WBC: 17.8 10*3/uL — ABNORMAL HIGH (ref 4.0–10.5)

## 2016-09-21 LAB — BASIC METABOLIC PANEL
ANION GAP: 6 (ref 5–15)
BUN: 12 mg/dL (ref 6–20)
CHLORIDE: 96 mmol/L — AB (ref 101–111)
CO2: 30 mmol/L (ref 22–32)
Calcium: 8.2 mg/dL — ABNORMAL LOW (ref 8.9–10.3)
Creatinine, Ser: 0.55 mg/dL — ABNORMAL LOW (ref 0.61–1.24)
GFR calc Af Amer: >60 mL/min (ref >60)
GLUCOSE: 62 mg/dL — AB (ref 65–99)
Potassium: 4.1 mmol/L (ref 3.5–5.1)
Sodium: 132 mmol/L — ABNORMAL LOW (ref 135–145)

## 2016-09-21 LAB — VANCOMYCIN, TROUGH: Vancomycin Tr: 10 ug/mL — ABNORMAL LOW (ref 15–20)

## 2016-10-01 ENCOUNTER — Other Ambulatory Visit (HOSPITAL_COMMUNITY)
Admission: RE | Admit: 2016-10-01 | Discharge: 2016-10-01 | Disposition: A | Discharge status: Home / Self Care | Payer: Medicare Other | Source: Ambulatory Visit | Attending: Family Medicine | Admitting: Family Medicine

## 2016-10-01 DIAGNOSIS — J189 Pneumonia, unspecified organism: Secondary | ICD-10-CM | POA: Insufficient documentation

## 2016-10-01 LAB — BASIC METABOLIC PANEL
Anion gap: 8 (ref 5–15)
BUN: 16 mg/dL (ref 6–20)
CALCIUM: 8.5 mg/dL — AB (ref 8.9–10.3)
CO2: 29 mmol/L (ref 22–32)
CREATININE: 0.51 mg/dL — AB (ref 0.61–1.24)
Chloride: 96 mmol/L — ABNORMAL LOW (ref 101–111)
GFR calc non Af Amer: >60 mL/min (ref >60)
GLUCOSE: 232 mg/dL — AB (ref 65–99)
Potassium: 4.3 mmol/L (ref 3.5–5.1)
Sodium: 133 mmol/L — ABNORMAL LOW (ref 135–145)

## 2016-10-01 LAB — VANCOMYCIN, TROUGH: Vancomycin Tr: 13 ug/mL — ABNORMAL LOW (ref 15–20)

## 2016-10-08 ENCOUNTER — Encounter (HOSPITAL_COMMUNITY): Payer: Self-pay

## 2016-10-08 ENCOUNTER — Inpatient Hospital Stay (HOSPITAL_COMMUNITY)
Admission: EM | Admit: 2016-10-08 | Discharge: 2016-10-16 | DRG: 511 | Disposition: A | Discharge status: Home / Self Care | Payer: Medicare Other | Attending: Internal Medicine | Admitting: Internal Medicine

## 2016-10-08 DIAGNOSIS — A499 Bacterial infection, unspecified: Secondary | ICD-10-CM

## 2016-10-08 DIAGNOSIS — M25521 Pain in right elbow: Secondary | ICD-10-CM | POA: Diagnosis not present

## 2016-10-08 DIAGNOSIS — L089 Local infection of the skin and subcutaneous tissue, unspecified: Secondary | ICD-10-CM

## 2016-10-08 DIAGNOSIS — I714 Abdominal aortic aneurysm, without rupture: Secondary | ICD-10-CM | POA: Diagnosis present

## 2016-10-08 DIAGNOSIS — Z880 Allergy status to penicillin: Secondary | ICD-10-CM

## 2016-10-08 DIAGNOSIS — E785 Hyperlipidemia, unspecified: Secondary | ICD-10-CM | POA: Diagnosis present

## 2016-10-08 DIAGNOSIS — F17210 Nicotine dependence, cigarettes, uncomplicated: Secondary | ICD-10-CM | POA: Diagnosis present

## 2016-10-08 DIAGNOSIS — R7881 Bacteremia: Secondary | ICD-10-CM | POA: Diagnosis present

## 2016-10-08 DIAGNOSIS — M71121 Other infective bursitis, right elbow: Secondary | ICD-10-CM | POA: Diagnosis not present

## 2016-10-08 DIAGNOSIS — Z79899 Other long term (current) drug therapy: Secondary | ICD-10-CM

## 2016-10-08 DIAGNOSIS — I5032 Chronic diastolic (congestive) heart failure: Secondary | ICD-10-CM | POA: Diagnosis present

## 2016-10-08 DIAGNOSIS — R1310 Dysphagia, unspecified: Secondary | ICD-10-CM | POA: Diagnosis present

## 2016-10-08 DIAGNOSIS — R52 Pain, unspecified: Secondary | ICD-10-CM

## 2016-10-08 DIAGNOSIS — S41101A Unspecified open wound of right upper arm, initial encounter: Secondary | ICD-10-CM

## 2016-10-08 DIAGNOSIS — L89010 Pressure ulcer of right elbow, unstageable: Secondary | ICD-10-CM | POA: Diagnosis present

## 2016-10-08 DIAGNOSIS — F419 Anxiety disorder, unspecified: Secondary | ICD-10-CM | POA: Diagnosis present

## 2016-10-08 DIAGNOSIS — Z7982 Long term (current) use of aspirin: Secondary | ICD-10-CM

## 2016-10-08 DIAGNOSIS — D62 Acute posthemorrhagic anemia: Secondary | ICD-10-CM | POA: Diagnosis not present

## 2016-10-08 DIAGNOSIS — L8990 Pressure ulcer of unspecified site, unspecified stage: Secondary | ICD-10-CM | POA: Diagnosis present

## 2016-10-08 DIAGNOSIS — Z8614 Personal history of Methicillin resistant Staphylococcus aureus infection: Secondary | ICD-10-CM

## 2016-10-08 DIAGNOSIS — B961 Klebsiella pneumoniae [K. pneumoniae] as the cause of diseases classified elsewhere: Secondary | ICD-10-CM | POA: Diagnosis present

## 2016-10-08 DIAGNOSIS — I10 Essential (primary) hypertension: Secondary | ICD-10-CM | POA: Diagnosis present

## 2016-10-08 DIAGNOSIS — Z794 Long term (current) use of insulin: Secondary | ICD-10-CM

## 2016-10-08 DIAGNOSIS — H5461 Unqualified visual loss, right eye, normal vision left eye: Secondary | ICD-10-CM | POA: Diagnosis present

## 2016-10-08 DIAGNOSIS — I110 Hypertensive heart disease with heart failure: Secondary | ICD-10-CM | POA: Diagnosis present

## 2016-10-08 DIAGNOSIS — B965 Pseudomonas (aeruginosa) (mallei) (pseudomallei) as the cause of diseases classified elsewhere: Secondary | ICD-10-CM | POA: Diagnosis present

## 2016-10-08 DIAGNOSIS — T07XXXA Unspecified multiple injuries, initial encounter: Secondary | ICD-10-CM

## 2016-10-08 DIAGNOSIS — Z833 Family history of diabetes mellitus: Secondary | ICD-10-CM

## 2016-10-08 HISTORY — DX: Unspecified chronic bronchitis: J42

## 2016-10-08 HISTORY — DX: Type 2 diabetes mellitus without complications: E11.9

## 2016-10-08 NOTE — ED Triage Notes (Addendum)
Pt has two wounds to right arm that began as an abscess. One wound is on his right elbow and the other noted to posterior upper arm. Tunneling to both wounds, purulent drainage and erythema to peri-wound area. Daughter states the home health nurse told them to come here because the wounds appear worse. Currently on Vancomycin for this.

## 2016-10-09 ENCOUNTER — Encounter (HOSPITAL_COMMUNITY)
Admission: EM | Disposition: A | Discharge status: Home / Self Care | Payer: Self-pay | Source: Home / Self Care | Attending: Internal Medicine

## 2016-10-09 ENCOUNTER — Inpatient Hospital Stay (HOSPITAL_COMMUNITY): Payer: Medicare Other

## 2016-10-09 ENCOUNTER — Emergency Department (HOSPITAL_COMMUNITY): Payer: Medicare Other

## 2016-10-09 ENCOUNTER — Inpatient Hospital Stay (HOSPITAL_COMMUNITY): Payer: Medicare Other | Admitting: Certified Registered Nurse Anesthetist

## 2016-10-09 ENCOUNTER — Encounter (HOSPITAL_COMMUNITY): Payer: Self-pay

## 2016-10-09 DIAGNOSIS — Z794 Long term (current) use of insulin: Secondary | ICD-10-CM | POA: Diagnosis not present

## 2016-10-09 DIAGNOSIS — E119 Type 2 diabetes mellitus without complications: Secondary | ICD-10-CM | POA: Diagnosis not present

## 2016-10-09 DIAGNOSIS — Z9889 Other specified postprocedural states: Secondary | ICD-10-CM | POA: Diagnosis not present

## 2016-10-09 DIAGNOSIS — H5461 Unqualified visual loss, right eye, normal vision left eye: Secondary | ICD-10-CM | POA: Diagnosis present

## 2016-10-09 DIAGNOSIS — Z95828 Presence of other vascular implants and grafts: Secondary | ICD-10-CM | POA: Diagnosis not present

## 2016-10-09 DIAGNOSIS — I5032 Chronic diastolic (congestive) heart failure: Secondary | ICD-10-CM | POA: Diagnosis present

## 2016-10-09 DIAGNOSIS — L89013 Pressure ulcer of right elbow, stage 3: Secondary | ICD-10-CM | POA: Diagnosis not present

## 2016-10-09 DIAGNOSIS — L89010 Pressure ulcer of right elbow, unstageable: Secondary | ICD-10-CM | POA: Diagnosis present

## 2016-10-09 DIAGNOSIS — F17210 Nicotine dependence, cigarettes, uncomplicated: Secondary | ICD-10-CM | POA: Diagnosis present

## 2016-10-09 DIAGNOSIS — L98494 Non-pressure chronic ulcer of skin of other sites with necrosis of bone: Secondary | ICD-10-CM | POA: Diagnosis not present

## 2016-10-09 DIAGNOSIS — M726 Necrotizing fasciitis: Secondary | ICD-10-CM | POA: Diagnosis not present

## 2016-10-09 DIAGNOSIS — I10 Essential (primary) hypertension: Secondary | ICD-10-CM | POA: Diagnosis not present

## 2016-10-09 DIAGNOSIS — F419 Anxiety disorder, unspecified: Secondary | ICD-10-CM | POA: Diagnosis present

## 2016-10-09 DIAGNOSIS — R7881 Bacteremia: Secondary | ICD-10-CM | POA: Diagnosis present

## 2016-10-09 DIAGNOSIS — Z880 Allergy status to penicillin: Secondary | ICD-10-CM | POA: Diagnosis not present

## 2016-10-09 DIAGNOSIS — Z8614 Personal history of Methicillin resistant Staphylococcus aureus infection: Secondary | ICD-10-CM | POA: Diagnosis not present

## 2016-10-09 DIAGNOSIS — R1310 Dysphagia, unspecified: Secondary | ICD-10-CM | POA: Diagnosis present

## 2016-10-09 DIAGNOSIS — M71121 Other infective bursitis, right elbow: Principal | ICD-10-CM

## 2016-10-09 DIAGNOSIS — F17200 Nicotine dependence, unspecified, uncomplicated: Secondary | ICD-10-CM | POA: Diagnosis not present

## 2016-10-09 DIAGNOSIS — B9562 Methicillin resistant Staphylococcus aureus infection as the cause of diseases classified elsewhere: Secondary | ICD-10-CM | POA: Diagnosis present

## 2016-10-09 DIAGNOSIS — Z7982 Long term (current) use of aspirin: Secondary | ICD-10-CM | POA: Diagnosis not present

## 2016-10-09 DIAGNOSIS — L0390 Cellulitis, unspecified: Secondary | ICD-10-CM | POA: Diagnosis not present

## 2016-10-09 DIAGNOSIS — I5030 Unspecified diastolic (congestive) heart failure: Secondary | ICD-10-CM | POA: Diagnosis not present

## 2016-10-09 DIAGNOSIS — B961 Klebsiella pneumoniae [K. pneumoniae] as the cause of diseases classified elsewhere: Secondary | ICD-10-CM | POA: Diagnosis present

## 2016-10-09 DIAGNOSIS — Z9689 Presence of other specified functional implants: Secondary | ICD-10-CM | POA: Diagnosis not present

## 2016-10-09 DIAGNOSIS — B965 Pseudomonas (aeruginosa) (mallei) (pseudomallei) as the cause of diseases classified elsewhere: Secondary | ICD-10-CM | POA: Diagnosis present

## 2016-10-09 DIAGNOSIS — Z79899 Other long term (current) drug therapy: Secondary | ICD-10-CM | POA: Diagnosis not present

## 2016-10-09 DIAGNOSIS — E785 Hyperlipidemia, unspecified: Secondary | ICD-10-CM | POA: Diagnosis present

## 2016-10-09 DIAGNOSIS — I110 Hypertensive heart disease with heart failure: Secondary | ICD-10-CM | POA: Diagnosis present

## 2016-10-09 DIAGNOSIS — D649 Anemia, unspecified: Secondary | ICD-10-CM | POA: Diagnosis not present

## 2016-10-09 DIAGNOSIS — I714 Abdominal aortic aneurysm, without rupture: Secondary | ICD-10-CM | POA: Diagnosis present

## 2016-10-09 DIAGNOSIS — Z833 Family history of diabetes mellitus: Secondary | ICD-10-CM | POA: Diagnosis not present

## 2016-10-09 DIAGNOSIS — D62 Acute posthemorrhagic anemia: Secondary | ICD-10-CM | POA: Diagnosis not present

## 2016-10-09 DIAGNOSIS — M25521 Pain in right elbow: Secondary | ICD-10-CM | POA: Diagnosis present

## 2016-10-09 HISTORY — PX: INCISION AND DRAINAGE OF WOUND: SHX1803

## 2016-10-09 HISTORY — PX: I & D EXTREMITY: SHX5045

## 2016-10-09 HISTORY — PX: APPLICATION OF WOUND VAC: SHX5189

## 2016-10-09 LAB — CBC WITH DIFFERENTIAL/PLATELET
Basophils Absolute: 0.1 10*3/uL (ref 0.0–0.1)
Basophils Relative: 1 %
Eosinophils Absolute: 2 10*3/uL — ABNORMAL HIGH (ref 0.0–0.7)
Eosinophils Relative: 14 %
HCT: 34.1 % — ABNORMAL LOW (ref 39.0–52.0)
Hemoglobin: 11 g/dL — ABNORMAL LOW (ref 13.0–17.0)
Lymphocytes Relative: 14 %
Lymphs Abs: 2 10*3/uL (ref 0.7–4.0)
MCH: 28.5 pg (ref 26.0–34.0)
MCHC: 32.3 g/dL (ref 30.0–36.0)
MCV: 88.3 fL (ref 78.0–100.0)
Monocytes Absolute: 1.3 10*3/uL — ABNORMAL HIGH (ref 0.1–1.0)
Monocytes Relative: 9 %
Neutro Abs: 8.7 10*3/uL — ABNORMAL HIGH (ref 1.7–7.7)
Neutrophils Relative %: 62 %
Platelets: 415 10*3/uL — ABNORMAL HIGH (ref 150–400)
RBC: 3.86 MIL/uL — ABNORMAL LOW (ref 4.22–5.81)
RDW: 15.9 % — ABNORMAL HIGH (ref 11.5–15.5)
WBC: 14.1 10*3/uL — ABNORMAL HIGH (ref 4.0–10.5)

## 2016-10-09 LAB — GLUCOSE, CAPILLARY
GLUCOSE-CAPILLARY: 197 mg/dL — AB (ref 65–99)
Glucose-Capillary: 69 mg/dL (ref 65–99)
Glucose-Capillary: 64 mg/dL — ABNORMAL LOW (ref 65–99)
Glucose-Capillary: 176 mg/dL — ABNORMAL HIGH (ref 65–99)
Glucose-Capillary: 206 mg/dL — ABNORMAL HIGH (ref 65–99)

## 2016-10-09 LAB — MRSA PCR SCREENING: MRSA by PCR: NEGATIVE

## 2016-10-09 LAB — BASIC METABOLIC PANEL
Anion gap: 10 (ref 5–15)
BUN: 9 mg/dL (ref 6–20)
CO2: 26 mmol/L (ref 22–32)
Calcium: 9.1 mg/dL (ref 8.9–10.3)
Chloride: 101 mmol/L (ref 101–111)
Creatinine, Ser: 0.53 mg/dL — ABNORMAL LOW (ref 0.61–1.24)
GFR calc Af Amer: >60 mL/min (ref >60)
GFR calc non Af Amer: >60 mL/min (ref >60)
Glucose, Bld: 166 mg/dL — ABNORMAL HIGH (ref 65–99)
Potassium: 4.1 mmol/L (ref 3.5–5.1)
Sodium: 137 mmol/L (ref 135–145)

## 2016-10-09 LAB — VANCOMYCIN, RANDOM: Vancomycin Rm: 21

## 2016-10-09 LAB — C-REACTIVE PROTEIN: CRP: <0.8 mg/dL (ref <1.0)

## 2016-10-09 LAB — SEDIMENTATION RATE: Sed Rate: 48 mm/hr — ABNORMAL HIGH (ref 0–16)

## 2016-10-09 SURGERY — IRRIGATION AND DEBRIDEMENT EXTREMITY
Anesthesia: General | Site: Arm Upper | Laterality: Right

## 2016-10-09 MED ORDER — HYDROMORPHONE HCL 1 MG/ML IJ SOLN
INTRAMUSCULAR | Status: DC | PRN
  Administered 2016-10-09: .5 mg via INTRAVENOUS

## 2016-10-09 MED ORDER — FENTANYL CITRATE (PF) 100 MCG/2ML IJ SOLN
INTRAMUSCULAR | Status: AC
  Filled 2016-10-09: qty 4

## 2016-10-09 MED ORDER — NYSTATIN 100000 UNIT/GM EX POWD
Freq: Three times a day (TID) | CUTANEOUS | Status: DC
  Administered 2016-10-09 (×2): via TOPICAL
  Administered 2016-10-10: 1 via TOPICAL
  Administered 2016-10-10 – 2016-10-14 (×12): via TOPICAL
  Filled 2016-10-09 (×2): qty 15

## 2016-10-09 MED ORDER — GABAPENTIN 100 MG PO CAPS
100.0000 mg | ORAL_CAPSULE | Freq: Three times a day (TID) | ORAL | Status: DC
  Administered 2016-10-09 – 2016-10-15 (×17): 100 mg via ORAL
  Filled 2016-10-09 (×19): qty 1

## 2016-10-09 MED ORDER — ATORVASTATIN CALCIUM 20 MG PO TABS
20.0000 mg | ORAL_TABLET | Freq: Every day | ORAL | Status: DC
Start: 2016-10-09 — End: 2016-10-15
  Administered 2016-10-09 – 2016-10-14 (×5): 20 mg via ORAL
  Filled 2016-10-09 (×6): qty 1

## 2016-10-09 MED ORDER — DEXTROSE-NACL 5-0.9 % IV SOLN
INTRAVENOUS | Status: DC
  Administered 2016-10-09 – 2016-10-11 (×3): via INTRAVENOUS

## 2016-10-09 MED ORDER — VANCOMYCIN IV (FOR PTA / DISCHARGE USE ONLY)
750.0000 mg | Freq: Two times a day (BID) | INTRAVENOUS | Status: DC
Start: 2016-10-09 — End: 2016-10-09

## 2016-10-09 MED ORDER — FENTANYL CITRATE (PF) 100 MCG/2ML IJ SOLN
INTRAMUSCULAR | Status: DC | PRN
  Administered 2016-10-09 (×4): 50 ug via INTRAVENOUS

## 2016-10-09 MED ORDER — FENTANYL CITRATE (PF) 100 MCG/2ML IJ SOLN: 25.0000 ug | INTRAMUSCULAR | Status: DC | PRN

## 2016-10-09 MED ORDER — INSULIN GLARGINE 100 UNIT/ML ~~LOC~~ SOLN: 5.0000 [IU] | Freq: Every day | SUBCUTANEOUS | Status: DC

## 2016-10-09 MED ORDER — ENOXAPARIN SODIUM 40 MG/0.4ML ~~LOC~~ SOLN: 40.0000 mg | SUBCUTANEOUS | Status: DC

## 2016-10-09 MED ORDER — CHLORHEXIDINE GLUCONATE CLOTH 2 % EX PADS
6.0000 | MEDICATED_PAD | Freq: Every day | CUTANEOUS | Status: DC
  Administered 2016-10-09 – 2016-10-15 (×6): 6 via TOPICAL

## 2016-10-09 MED ORDER — SODIUM CHLORIDE 0.9 % IV SOLN: INTRAVENOUS | Status: DC

## 2016-10-09 MED ORDER — INSULIN ASPART 100 UNIT/ML ~~LOC~~ SOLN: 0.0000 [IU] | Freq: Three times a day (TID) | SUBCUTANEOUS | Status: DC

## 2016-10-09 MED ORDER — SODIUM CHLORIDE 0.9 % IV SOLN: INTRAVENOUS | Status: DC | PRN

## 2016-10-09 MED ORDER — SODIUM CHLORIDE 0.9 % IR SOLN
Status: DC | PRN
  Administered 2016-10-09: 1000 mL

## 2016-10-09 MED ORDER — AMLODIPINE BESYLATE 10 MG PO TABS
10.0000 mg | ORAL_TABLET | Freq: Every day | ORAL | Status: DC
  Administered 2016-10-09 – 2016-10-15 (×7): 10 mg via ORAL
  Filled 2016-10-09 (×7): qty 1

## 2016-10-09 MED ORDER — ALBUMIN HUMAN 5 % IV SOLN
INTRAVENOUS | Status: DC | PRN
  Administered 2016-10-09: 15:00:00 via INTRAVENOUS

## 2016-10-09 MED ORDER — SIMVASTATIN 40 MG PO TABS: 40.0000 mg | ORAL_TABLET | Freq: Every day | ORAL | Status: DC

## 2016-10-09 MED ORDER — IOPAMIDOL (ISOVUE-300) INJECTION 61%
INTRAVENOUS | Status: AC
  Administered 2016-10-09: 100 mL
  Filled 2016-10-09: qty 100

## 2016-10-09 MED ORDER — HYDROMORPHONE HCL 1 MG/ML IJ SOLN
INTRAMUSCULAR | Status: AC
  Filled 2016-10-09: qty 1

## 2016-10-09 MED ORDER — ENSURE ENLIVE PO LIQD
237.0000 mL | ORAL | Status: DC
  Administered 2016-10-10 – 2016-10-14 (×4): 237 mL via ORAL

## 2016-10-09 MED ORDER — ONDANSETRON HCL 4 MG/2ML IJ SOLN
INTRAMUSCULAR | Status: DC | PRN
  Administered 2016-10-09: 4 mg via INTRAVENOUS

## 2016-10-09 MED ORDER — DEXTROSE 50 % IV SOLN
INTRAVENOUS | Status: DC | PRN
  Administered 2016-10-09: 12.5 g via INTRAVENOUS

## 2016-10-09 MED ORDER — NICOTINE 7 MG/24HR TD PT24
7.0000 mg | MEDICATED_PATCH | Freq: Once | TRANSDERMAL | Status: AC
Start: 2016-10-09 — End: 2016-10-10
  Administered 2016-10-09: 7 mg via TRANSDERMAL
  Filled 2016-10-09: qty 1

## 2016-10-09 MED ORDER — SODIUM CHLORIDE 0.9% FLUSH
10.0000 mL | INTRAVENOUS | Status: DC | PRN
  Administered 2016-10-15: 10 mL
  Filled 2016-10-09: qty 40

## 2016-10-09 MED ORDER — METOPROLOL SUCCINATE ER 100 MG PO TB24
100.0000 mg | ORAL_TABLET | Freq: Every day | ORAL | Status: DC
  Administered 2016-10-09 – 2016-10-15 (×8): 100 mg via ORAL
  Filled 2016-10-09 (×8): qty 1

## 2016-10-09 MED ORDER — PROPOFOL 10 MG/ML IV BOLUS
INTRAVENOUS | Status: DC | PRN
  Administered 2016-10-09: 170 mg via INTRAVENOUS

## 2016-10-09 MED ORDER — VANCOMYCIN HCL 10 G IV SOLR
1250.0000 mg | Freq: Two times a day (BID) | INTRAVENOUS | Status: DC
  Administered 2016-10-09 – 2016-10-11 (×5): 1250 mg via INTRAVENOUS
  Filled 2016-10-09 (×5): qty 1250

## 2016-10-09 MED ORDER — LIDOCAINE 2% (20 MG/ML) 5 ML SYRINGE
INTRAMUSCULAR | Status: DC | PRN
  Administered 2016-10-09: 100 mg via INTRAVENOUS

## 2016-10-09 MED ORDER — VITAMIN B-1 100 MG PO TABS
100.0000 mg | ORAL_TABLET | Freq: Every day | ORAL | Status: DC
  Administered 2016-10-09 – 2016-10-15 (×7): 100 mg via ORAL
  Filled 2016-10-09 (×7): qty 1

## 2016-10-09 MED ORDER — NICOTINE 21 MG/24HR TD PT24
21.0000 mg | MEDICATED_PATCH | Freq: Every day | TRANSDERMAL | Status: DC
  Administered 2016-10-09 – 2016-10-15 (×7): 21 mg via TRANSDERMAL
  Filled 2016-10-09 (×7): qty 1

## 2016-10-09 MED ORDER — INSULIN ASPART 100 UNIT/ML ~~LOC~~ SOLN
0.0000 [IU] | SUBCUTANEOUS | Status: DC
  Administered 2016-10-10: 2 [IU] via SUBCUTANEOUS
  Administered 2016-10-10: 7 [IU] via SUBCUTANEOUS
  Administered 2016-10-11: 2 [IU] via SUBCUTANEOUS
  Administered 2016-10-11: 5 [IU] via SUBCUTANEOUS
  Administered 2016-10-11: 3 [IU] via SUBCUTANEOUS
  Administered 2016-10-12 (×2): 2 [IU] via SUBCUTANEOUS

## 2016-10-09 MED ORDER — EPHEDRINE SULFATE 50 MG/ML IJ SOLN
INTRAMUSCULAR | Status: DC | PRN
  Administered 2016-10-09: 5 mg via INTRAVENOUS
  Administered 2016-10-09 (×2): 10 mg via INTRAVENOUS
  Administered 2016-10-09: 15 mg via INTRAVENOUS
  Administered 2016-10-09: 10 mg via INTRAVENOUS

## 2016-10-09 MED ORDER — SODIUM CHLORIDE 0.9% FLUSH
3.0000 mL | Freq: Two times a day (BID) | INTRAVENOUS | Status: DC
  Administered 2016-10-12 (×2): 3 mL via INTRAVENOUS

## 2016-10-09 MED ORDER — ASPIRIN 325 MG PO TABS
325.0000 mg | ORAL_TABLET | Freq: Every day | ORAL | Status: DC
  Administered 2016-10-09 – 2016-10-15 (×7): 325 mg via ORAL
  Filled 2016-10-09 (×7): qty 1

## 2016-10-09 MED ORDER — SODIUM CHLORIDE 0.9 % IR SOLN
Status: DC | PRN
  Administered 2016-10-09: 6000 mL

## 2016-10-09 SURGICAL SUPPLY — 51 items
BANDAGE ELASTIC 3 VELCRO ST LF (GAUZE/BANDAGES/DRESSINGS) IMPLANT
BNDG COHESIVE 1X5 TAN STRL LF (GAUZE/BANDAGES/DRESSINGS) IMPLANT
BNDG COHESIVE 4X5 TAN STRL (GAUZE/BANDAGES/DRESSINGS) ×2 IMPLANT
BNDG COHESIVE 6X5 TAN STRL LF (GAUZE/BANDAGES/DRESSINGS) ×4 IMPLANT
BNDG CONFORM 3 STRL LF (GAUZE/BANDAGES/DRESSINGS) IMPLANT
BNDG GAUZE STRTCH 6 (GAUZE/BANDAGES/DRESSINGS) ×4 IMPLANT
CONT SPEC 4OZ CLIKSEAL STRL BL (MISCELLANEOUS) ×2 IMPLANT
CORDS BIPOLAR (ELECTRODE) ×2 IMPLANT
COVER SURGICAL LIGHT HANDLE (MISCELLANEOUS) ×4 IMPLANT
CUFF TOURNIQUET SINGLE 24IN (TOURNIQUET CUFF) IMPLANT
CUFF TOURNIQUET SINGLE 34IN LL (TOURNIQUET CUFF) IMPLANT
CUFF TOURNIQUET SINGLE 44IN (TOURNIQUET CUFF) IMPLANT
DRAPE INCISE IOBAN 66X45 STRL (DRAPES) IMPLANT
DRAPE U-SHAPE 47X51 STRL (DRAPES) ×4 IMPLANT
DRSG VAC ATS LRG SENSATRAC (GAUZE/BANDAGES/DRESSINGS) ×2 IMPLANT
DURAPREP 26ML APPLICATOR (WOUND CARE) ×2 IMPLANT
ELECT CAUTERY BLADE 6.4 (BLADE) ×2 IMPLANT
ELECT REM PT RETURN 9FT ADLT (ELECTROSURGICAL)
ELECTRODE REM PT RTRN 9FT ADLT (ELECTROSURGICAL) IMPLANT
GAUZE SPONGE 4X4 12PLY STRL (GAUZE/BANDAGES/DRESSINGS) ×8 IMPLANT
GAUZE XEROFORM 5X9 LF (GAUZE/BANDAGES/DRESSINGS) ×2 IMPLANT
GLOVE SKINSENSE NS SZ7.5 (GLOVE) ×4
GLOVE SKINSENSE STRL SZ7.5 (GLOVE) ×4 IMPLANT
GOWN STRL REIN XL XLG (GOWN DISPOSABLE) ×8 IMPLANT
HANDPIECE INTERPULSE COAX TIP (DISPOSABLE)
KIT BASIN OR (CUSTOM PROCEDURE TRAY) ×4 IMPLANT
KIT ROOM TURNOVER OR (KITS) ×4 IMPLANT
MANIFOLD NEPTUNE II (INSTRUMENTS) ×4 IMPLANT
PACK ORTHO EXTREMITY (CUSTOM PROCEDURE TRAY) ×4 IMPLANT
PAD ABD 8X10 STRL (GAUZE/BANDAGES/DRESSINGS) ×4 IMPLANT
PAD ARMBOARD 7.5X6 YLW CONV (MISCELLANEOUS) ×8 IMPLANT
PADDING CAST ABS 4INX4YD NS (CAST SUPPLIES) ×2
PADDING CAST ABS COTTON 4X4 ST (CAST SUPPLIES) ×2 IMPLANT
PADDING CAST COTTON 6X4 STRL (CAST SUPPLIES) ×4 IMPLANT
SET HNDPC FAN SPRY TIP SCT (DISPOSABLE) IMPLANT
SPONGE LAP 18X18 X RAY DECT (DISPOSABLE) ×4 IMPLANT
STOCKINETTE IMPERVIOUS 9X36 MD (GAUZE/BANDAGES/DRESSINGS) ×4 IMPLANT
SUT ETHILON 2 0 FS 18 (SUTURE) ×6 IMPLANT
SUT ETHILON 2 0 PSLX (SUTURE) ×2 IMPLANT
SUT VIC AB 2-0 FS1 27 (SUTURE) ×4 IMPLANT
TOWEL OR 17X24 6PK STRL BLUE (TOWEL DISPOSABLE) ×4 IMPLANT
TOWEL OR 17X26 10 PK STRL BLUE (TOWEL DISPOSABLE) ×4 IMPLANT
TUBE ANAEROBIC SPECIMEN COL (MISCELLANEOUS) IMPLANT
TUBE CONNECTING 12'X1/4 (SUCTIONS) ×1
TUBE CONNECTING 12X1/4 (SUCTIONS) ×3 IMPLANT
TUBE FEEDING 5FR 15 INCH (TUBING) IMPLANT
TUBING CYSTO DISP (UROLOGICAL SUPPLIES) ×4 IMPLANT
UNDERPAD 30X30 (UNDERPADS AND DIAPERS) ×8 IMPLANT
WATER STERILE IRR 1000ML POUR (IV SOLUTION) ×4 IMPLANT
WND VAC CANISTER 500ML (MISCELLANEOUS) ×2 IMPLANT
YANKAUER SUCT BULB TIP NO VENT (SUCTIONS) ×4 IMPLANT

## 2016-10-09 NOTE — ED Notes (Signed)
Delay in lab draw,  Pt currently in xray. 

## 2016-10-09 NOTE — Progress Notes (Signed)
  Echocardiogram 2D Echocardiogram has been attempted, patient not in room.  Aggie Cosier 10/09/2016, 12:56 PM

## 2016-10-09 NOTE — ED Provider Notes (Signed)
[facility] DEPT Provider Note   CSN: [id number] Arrival date & time: 10/08/16  2213     History   Chief Complaint Chief Complaint  Patient presents with  . Wound Check    HPI Andrew [name] is a 71 y.o. male with history of diabetes, hypertension who presents for wound check of his previously treated on. Patient was discharged from the hospital on 09/15/2016. Patient has a PICC line for vancomycin. Patient has home health wound care at home, who recommended the patient be evaluated in the emergency department due to worsening of his wounds. Patient also has a new wound to his left buttocks that popped and drained earlier according to family. Patient denies any increasing pain to any of his wounds. There has been drainage. Patient also reports right rib pain after his son lifted him up and patient heard a crack. Patient denies any fevers, chest pain, shortness of breath, abdominal pain, nausea, vomiting, urinary symptoms.  HPI  Past Medical History:  Diagnosis Date  . Blind right eye   . Diabetes mellitus without complication ([place])   . Hyperlipemia   . Hypertension   . 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 Protein-calorie malnutrition, severe 09/02/2016  . Elevated troponin 08/31/2016  . Pressure injury of skin 08/31/2016  . Sepsis ([place]) 08/30/2016  . Chest pain 08/30/2016  . AKI (acute kidney injury) ([place]) 08/30/2016  . Chronic diastolic CHF (congestive heart failure) ([place]) 08/30/2016  . Septic olecranon bursitis of right elbow   . Essential hypertension   . Hypokalemia 04/12/2015  . 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 Hypomagnesemia   . Bacteremia due to methicillin resistant Staphylococcus aureus 04/10/2015  . Atherosclerotic peripheral vascular disease ([place]) 04/10/2015  . Acute bronchitis 04/09/2015  . Hypertension 04/09/2015  .  Hyperlipidemia 04/09/2015  . Alcohol abuse 04/09/2015  . Tobacco abuse disorder 04/09/2015  . Acute encephalopathy     History reviewed. No pertinent surgical history.     Home Medications    Prior to Admission medications   Medication Sig Start Date End Date Taking? Authorizing Provider  amLODipine (NORVASC) 10 MG tablet Take 1 tablet (10 mg total) by mouth daily. 04/15/15   Eugenie Filler, MD  aspirin 325 MG tablet Take 325 mg by mouth daily.    Historical Provider, MD  feeding supplement, ENSURE ENLIVE, (ENSURE ENLIVE) LIQD Take 237 mLs by mouth daily. 09/15/16   Oswald Hillock, MD  gabapentin (NEURONTIN) 100 MG capsule Take 100 mg by mouth 3 (three) times daily.    Historical Provider, MD  insulin aspart (NOVOLOG) 100 UNIT/ML injection Sliding scale insulin Less than 70 initiate hypoglycemia protocol 70-120  0 units 120-150 1 unit 151-200 2 units 201-250 3 units 251-300 5 units 301-350 7 units 351-400 9 units  Greater than 400 call MD 09/15/16   Oswald Hillock, MD  insulin glargine (LANTUS) 100 UNIT/ML injection Inject 0.07 mLs (7 Units total) into the skin at bedtime. 09/15/16   Oswald Hillock, MD  JANUVIA 100 MG tablet Take 100 mg by mouth daily. 02/07/15   Historical Provider, MD  metFORMIN (GLUCOPHAGE) 500 MG tablet Take 1,000 mg by mouth 2 (two) times daily with a meal.    Historical Provider, MD  metoprolol succinate (TOPROL-XL) 100 MG 24 hr tablet Take 1 tablet (100 mg total) by mouth daily. Take with or immediately following a  meal. 09/16/16   Oswald Hillock, MD  simvastatin (ZOCOR) 40 MG tablet Take 40 mg by mouth daily.    Historical Provider, MD  thiamine 100 MG tablet Take 1 tablet (100 mg total) by mouth daily. 09/16/16   Oswald Hillock, MD  vancomycin IVPB Inject 750 mg into the vein every 12 (twelve) hours. Indication: MRSA bacteremia Last Day of Therapy:  10/12/16 Labs - Sunday/Monday:  CBC/D, BMP, and vancomycin trough. Labs - Thursday:  BMP and vancomycin trough Labs -  Every other week:  ESR and CRP 09/15/16 10/12/16  Oswald Hillock, MD    Family History Family History  Problem Relation Age of Onset  . Diabetes Mellitus II Mother   . Diabetes Mellitus II Father   . Diabetes Mellitus II Sister   . Diabetes Mellitus II Sister     Social History Social History  Substance Use Topics  . Smoking status: Current Every Day Smoker    Packs/day: 1.50    Types: Cigarettes  . Smokeless tobacco: Never Used  . Alcohol use 16.8 oz/week    28 Cans of beer per week     Allergies   Penicillins   Review of Systems Review of Systems  Constitutional: Negative for chills and fever.  HENT: Negative for facial swelling and sore throat.   Respiratory: Negative for shortness of breath.   Cardiovascular: Negative for chest pain.  Gastrointestinal: Negative for abdominal pain, nausea and vomiting.  Genitourinary: Negative for dysuria.  Musculoskeletal: Negative for back pain.  Skin: Positive for wound. Negative for rash.  Neurological: Negative for headaches.  Psychiatric/Behavioral: The patient is not nervous/anxious.      Physical Exam Updated Vital Signs BP 165/72   Pulse 71   Temp 97.9 F (36.6 C) (Oral)   Resp 16   Ht 5' 8"  (1.727 m)   Wt 66.7 kg   SpO2 98%   BMI 22.35 kg/m   Physical Exam  Constitutional: He appears well-developed and well-nourished. No distress.  HENT:  Head: Normocephalic and atraumatic.  Mouth/Throat: Oropharynx is clear and moist. No oropharyngeal exudate.  Eyes: Conjunctivae are normal. Pupils are equal, round, and reactive to light. Right eye exhibits no discharge. Left eye exhibits no discharge. No scleral icterus.  Neck: Normal range of motion. Neck supple. No thyromegaly present.  Cardiovascular: Normal rate, regular rhythm, normal heart sounds and intact distal pulses.  Exam reveals no gallop and no friction rub.   No murmur heard. Pulmonary/Chest: Effort normal and breath sounds normal. No stridor. No respiratory  distress. He has no wheezes. He has no rales.  Abdominal: Soft. Bowel sounds are normal. He exhibits no distension. There is no tenderness. There is no rebound and no guarding.  Musculoskeletal: He exhibits no edema.  R elbow: patient has FROM of joint without pain; normal sensation; no bony tenderness; radial pulse intact  Lymphadenopathy:    He has no cervical adenopathy.  Neurological: He is alert. Coordination normal.  Skin: Skin is warm and dry. No rash noted. He is not diaphoretic. No pallor.  3 large cavitary wounds, no significant surrounding erythema, to R elbow (one over olecranon process (~3cm diameter), 10 cm lateral proximal, and 2 cm medial; purulent drainage, probable tracking  Psychiatric: He has a normal mood and affect.  Nursing note and vitals reviewed.    ED Treatments / Results  Labs (all labs ordered are listed, but only abnormal results are displayed) Labs Reviewed  BASIC METABOLIC PANEL - Abnormal; Notable for the  following:       Result Value   Glucose, Bld 166 (*)    Creatinine, Ser 0.53 (*)    All other components within normal limits  CBC WITH DIFFERENTIAL/PLATELET - Abnormal; Notable for the following:    WBC 14.1 (*)    RBC 3.86 (*)    Hemoglobin 11.0 (*)    HCT 34.1 (*)    RDW 15.9 (*)    Platelets 415 (*)    Neutro Abs 8.7 (*)    Monocytes Absolute 1.3 (*)    Eosinophils Absolute 2.0 (*)    All other components within normal limits  CULTURE, BLOOD (ROUTINE X 2)  CULTURE, BLOOD (ROUTINE X 2)  GLUCOSE, CAPILLARY  VANCOMYCIN, RANDOM    EKG  EKG Interpretation None       Radiology Dg Ribs Unilateral W/chest Right  Result Date: 10/09/2016 CLINICAL DATA:  Right-sided rib pain EXAM: RIGHT RIBS AND CHEST - 3+ VIEW COMPARISON:  Chest radiograph 09/10/2016 FINDINGS: There is a left-sided approach PICC line with tip at the cavoatrial junction. Unchanged cardiomegaly with atherosclerotic calcification in the aortic arch. No pneumothorax or sizable  pleural effusion. Diffusely increased pulmonary markings. No focal consolidation. No rib fracture is identified. IMPRESSION: 1. No rib fracture. 2. Cardiomegaly and aortic atherosclerosis. Electronically Signed   By: Ulyses Jarred M.D.   On: 10/09/2016 01:01   Ct Humerus Right W Contrast  Result Date: 10/09/2016 CLINICAL DATA:  71 year old male with right arm wounds with current drainage and surrounding erythema. EXAM: CT OF THE UPPER RIGHT EXTREMITY WITH CONTRAST TECHNIQUE: Multidetector CT imaging of the upper right extremity was performed according to the standard protocol following intravenous contrast administration. COMPARISON:  CT of the right elbow dated 09/09/2016 CONTRAST:  100 cc Isovue-300 FINDINGS: Bones/Joint/Cartilage The bones are osteopenic. No acute fracture for dislocation. Old healed right clavicular fracture noted. No periosteal elevation or erosive changes of the bone to suggest osteomyelitis. Evaluation however is limited due to osteopenia. MRI or a white blood cell nuclear scan may provide better evaluation if there is high clinical concern for osteomyelitis. Ligaments Suboptimally assessed by CT. Muscles and Tendons No acute findings.  No intramuscular hematoma. Soft tissues There is extensive diffuse subcutaneous edema. There is skin defect with open wound in the medial aspect of the distal arm. A second open wound noted in the dorsal aspect distal on approximately 7 cm above the elbow. Additional open wounds likely present but outside of the field of view. Subcutaneous air communicates with skin defect and extends superficial to the muscular fascia. There has been significant interval increase in the amount of soft tissue air compared to the prior CT. No discrete drainable fluid collection or abscess identified. IMPRESSION: 1. Open skin wounds with extensive amount of soft tissue air dissecting through the subcutaneous fat superficial to the muscle fascia. There has been interval  increase in the size of the soft tissue air compared to the prior CT. 2. No definite evidence of osteomyelitis by CT. MRI or a WBC nuclear scan may provide better evaluation if there is high clinical concern for osteomyelitis. 3. Diffuse subcutaneous soft tissue edema. No discrete drainable fluid collection or abscess identified. Electronically Signed   By: Anner Crete M.D.   On: 10/09/2016 03:07    Procedures Procedures (including critical care time)  Medications Ordered in ED Medications  nicotine (NICODERM CQ - dosed in mg/24 hr) patch 7 mg (7 mg Transdermal Patch Applied 10/09/16 0337)  sodium chloride flush (NS) 0.9 %  injection 3 mL (not administered)  metoprolol succinate (TOPROL-XL) 24 hr tablet 100 mg (not administered)  thiamine (VITAMIN B-1) tablet 100 mg (not administered)  simvastatin (ZOCOR) tablet 40 mg (not administered)  gabapentin (NEURONTIN) capsule 100 mg (not administered)  feeding supplement (ENSURE ENLIVE) (ENSURE ENLIVE) liquid 237 mL (not administered)  aspirin tablet 325 mg (not administered)  amLODipine (NORVASC) tablet 10 mg (not administered)  nicotine (NICODERM CQ - dosed in mg/24 hours) patch 21 mg (not administered)  insulin aspart (novoLOG) injection 0-9 Units (not administered)  iopamidol (ISOVUE-300) 61 % injection (100 mLs  Contrast Given 10/09/16 0207)     Initial Impression / Assessment and Plan / ED Course  I have reviewed the triage vital signs and the nursing notes.  Pertinent labs & imaging results that were available during my care of the patient were reviewed by me and considered in my medical decision making (see chart for details).     CBC shows WBC 14.1, hemoglobin 11.0. BMP shows glucose 166, creatinine 0.53. CT of humerus shows 1. Open skin wounds with extensive amount of soft tissue air dissecting through the subcutaneous fat superficial to the muscle fascia. There has been interval increase in the size of the soft tissue air compared  to the prior CT. 2. No definite evidence of osteomyelitis by CT. MRI or a WBC nuclear scan may provide better evaluation if there is high clinical concern for osteomyelitis. 3. Diffuse subcutaneous soft tissue edema. No discrete drainable fluid collection or abscess identified. I consulted hospitalist, Dr. Alcario Drought, who will admit the patient for further evaluation and treatment. Most likely failed vancomycin treatment. Dr. Alcario Drought will consult infectious disease and orthopedics as needed.  Final Clinical Impressions(s) / ED Diagnoses   Final diagnoses:  Wounds, multiple  Soft tissue infection    New Prescriptions Current Discharge Medication List       Frederica Kuster, Hershal Coria 10/09/16 6226    Orpah Greek, MD 10/22/16 [phone]

## 2016-10-09 NOTE — Consult Note (Signed)
WOC consulted for elbow wounds, however after review of the chart patient's elbow wound will be managed by orthopaedics.  Contacted bedside nurse regarding notes that report buttock wound, she reports patient has a left buttock wound that is "deep". [place] nurse will consult for this wound instead.  [place] MSN, [place], [place]

## 2016-10-09 NOTE — Progress Notes (Signed)
Orthopedic Tech Progress Note Patient Details:  RANARDO MUTCH [birth year] [id number]  Ortho Devices Type of Ortho Device: Ace wrap, Arm sling, Long arm splint Ortho Device/Splint Location: rue Ortho Device/Splint Interventions: Application   [name] 10/09/2016, 5:02 PM As ordered by Dr. Erlinda Hong; sling was replaced as current one was bloody ; RN notified

## 2016-10-09 NOTE — Plan of Care (Signed)
Problem: Skin Integrity: Goal: Risk for impaired skin integrity will decrease Outcome: Not Progressing Wound care consult in.

## 2016-10-09 NOTE — Progress Notes (Signed)
Pharmacy Antibiotic Note  Andrew [name] is a 71 y.o. male admitted on 10/08/2016 with MRSA bacteremia and wound infection, c/o worsening appearance of wounds despite long ABX therapy.  Pharmacy has been consulted to continue vancomycin dosing that pt has been on as outpatient, started in hospital 1/15 w/ plan to continue until 2/26.  Most recent vanc trough in Epic is 13 but no notes re: whether vanc dose has changed from 750mg  Q12H as ordered at discharge.  Pt last rec'd a dose of vanc prior to arrival to ED late 2/22.  Plan: Will get a random vanc level this am to ensure vanc isn't accumulating and confirm current vanc dosing and timing.  Height: 5\' 8"  (172.7 cm) Weight: 147 lb (66.7 kg) IBW/kg (Calculated) : 68.4  Temp (24hrs), Avg:98.1 F (36.7 C), Min:97.9 F (36.6 C), Max:98.2 F (36.8 C)   Recent Labs Lab 10/09/16 0049  WBC 14.1*  CREATININE 0.53*    Estimated Creatinine Clearance: 79.9 mL/min (by C-G formula based on SCr of 0.53 mg/dL (L)).    Allergies  Allergen Reactions  . Penicillins Rash and Other (See Comments)    Tolerated Zosyn Jan 2018 Possibly rash? Has patient had a PCN reaction causing immediate rash, facial/tongue/throat swelling, SOB or lightheadedness with hypotension: YES Has patient had a PCN reaction causing severe rash involving mucus membranes or skin necrosis:NO Has patient had a PCN reaction that required hospitalization NO Has patient had a PCN reaction occurring within the last 10 years:NO If all of the above answers are "NO", then may proceed with Cephalosporin use.     Thank you for allowing pharmacy to be a part of this patient's care.  Wynona Neat, PharmD, BCPS  10/09/2016 5:56 AM

## 2016-10-09 NOTE — Progress Notes (Addendum)
Pt transported to 2W post RUE  I&D. Wound vac intact. Suction @125 . 150cc's bloody drainage noted in canister.  Excessive bloody drainage noted on arrival to 2W.  After extensive assessment & reassessment, finally noted a small tear in the transparent wound vac dsg.  Sealed with tegaderm. Wrapped with ABD's and kerlix. Splint to be applied by ortho tech per V.O. From Dr. Erlinda Hong.

## 2016-10-09 NOTE — Transfer of Care (Signed)
Immediate Anesthesia Transfer of Care Note  Patient: Andrew [name]  Procedure(s) Performed: Procedure(s): IRRIGATION AND DEBRIDEMENT EXTREMITY RIGHT ARM WOUND (Right) APPLICATION OF WOUND VAC (Right)  Patient Location: PACU  Anesthesia Type:General  Level of Consciousness: lethargic and responds to stimulation  Airway & Oxygen Therapy: Patient Spontanous Breathing and Patient connected to nasal cannula oxygen  Post-op Assessment: Report given to RN and Post -op Vital signs reviewed and stable  Post vital signs: Reviewed and stable  Last Vitals:  Vitals:   10/09/16 0500 10/09/16 0545  BP: 161/69 165/72  Pulse: 69 71  Resp:    Temp:      Last Pain:  Vitals:   10/09/16 1158  TempSrc:   PainSc: 0-No pain         Complications: No apparent anesthesia complications

## 2016-10-09 NOTE — Consult Note (Signed)
ORTHOPAEDIC CONSULTATION  REQUESTING PHYSICIAN: Etta Quill, DO  Chief Complaint: RUE wounds  HPI: Andrew [name] is a 71 y.o. male who presents with RUE wounds that's been progressively worse since January.  He was seen by Dr. Lorin Mercy at that time for olecranon bursitis treated conservatively.  Patient is very low functioning and does not really walk.  He presented last night to the ER with worsening RUE wounds.  He had MRSA bacteremia back in January.    Past Medical History:  Diagnosis Date  . Blind right eye   . Diabetes mellitus without complication ([place])   . Hyperlipemia   . Hypertension   . 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 No pertinent surgical history. Social History   Social History  . Marital status: Widowed    Spouse name: N/A  . Number of children: N/A  . Years of education: N/A   Social History Main Topics  . Smoking status: Current Every Day Smoker    Packs/day: 1.50    Types: Cigarettes  . Smokeless tobacco: Never Used  . Alcohol use 16.8 oz/week    28 Cans of beer per week  . Drug use: Unknown  . Sexual activity: Not Asked   Other Topics Concern  . None   Social History Narrative  . None   Family History  Problem Relation Age of Onset  . Diabetes Mellitus II Mother   . Diabetes Mellitus II Father   . Diabetes Mellitus II Sister   . Diabetes Mellitus II Sister    - negative except otherwise stated in the family history section Allergies  Allergen Reactions  . Penicillins Rash and Other (See Comments)    Tolerated Zosyn Jan 2018 Possibly rash? Has patient had a PCN reaction causing immediate rash, facial/tongue/throat swelling, SOB or lightheadedness with hypotension: YES Has patient had a PCN reaction causing severe rash involving mucus membranes or skin necrosis:NO Has patient had a PCN reaction that required hospitalization NO Has patient had a PCN reaction occurring within the  last 10 years:NO If all of the above answers are "NO", then may proceed with Cephalosporin use.   Prior to Admission medications   Medication Sig Start Date End Date Taking? Authorizing Provider  amLODipine (NORVASC) 10 MG tablet Take 1 tablet (10 mg total) by mouth daily. 04/15/15   Eugenie Filler, MD  aspirin 325 MG tablet Take 325 mg by mouth daily.    Historical Provider, MD  feeding supplement, ENSURE ENLIVE, (ENSURE ENLIVE) LIQD Take 237 mLs by mouth daily. 09/15/16   Oswald Hillock, MD  gabapentin (NEURONTIN) 100 MG capsule Take 100 mg by mouth 3 (three) times daily.    Historical Provider, MD  insulin aspart (NOVOLOG) 100 UNIT/ML injection Sliding scale insulin Less than 70 initiate hypoglycemia protocol 70-120  0 units 120-150 1 unit 151-200 2 units 201-250 3 units 251-300 5 units 301-350 7 units 351-400 9 units  Greater than 400 call MD 09/15/16   Oswald Hillock, MD  insulin glargine (LANTUS) 100 UNIT/ML injection Inject 0.07 mLs (7 Units total) into the skin at bedtime. 09/15/16   Oswald Hillock, MD  JANUVIA 100 MG tablet Take 100 mg by mouth daily. 02/07/15   Historical Provider, MD  metFORMIN (GLUCOPHAGE) 500 MG tablet Take 1,000 mg by mouth 2 (two) times daily with a meal.    Historical Provider, MD  metoprolol succinate (TOPROL-XL) 100 MG 24 hr tablet Take 1 tablet (  100 mg total) by mouth daily. Take with or immediately following a meal. 09/16/16   Oswald Hillock, MD  simvastatin (ZOCOR) 40 MG tablet Take 40 mg by mouth daily.    Historical Provider, MD  thiamine 100 MG tablet Take 1 tablet (100 mg total) by mouth daily. 09/16/16   Oswald Hillock, MD  vancomycin IVPB Inject 750 mg into the vein every 12 (twelve) hours. Indication: MRSA bacteremia Last Day of Therapy:  10/12/16 Labs - Sunday/Monday:  CBC/D, BMP, and vancomycin trough. Labs - Thursday:  BMP and vancomycin trough Labs - Every other week:  ESR and CRP 09/15/16 10/12/16  Oswald Hillock, MD   Dg Ribs Unilateral W/chest  Right  Result Date: 10/09/2016 CLINICAL DATA:  Right-sided rib pain EXAM: RIGHT RIBS AND CHEST - 3+ VIEW COMPARISON:  Chest radiograph 09/10/2016 FINDINGS: There is a left-sided approach PICC line with tip at the cavoatrial junction. Unchanged cardiomegaly with atherosclerotic calcification in the aortic arch. No pneumothorax or sizable pleural effusion. Diffusely increased pulmonary markings. No focal consolidation. No rib fracture is identified. IMPRESSION: 1. No rib fracture. 2. Cardiomegaly and aortic atherosclerosis. Electronically Signed   By: Ulyses Jarred M.D.   On: 10/09/2016 01:01   Ct Humerus Right W Contrast  Result Date: 10/09/2016 CLINICAL DATA:  71 year old male with right arm wounds with current drainage and surrounding erythema. EXAM: CT OF THE UPPER RIGHT EXTREMITY WITH CONTRAST TECHNIQUE: Multidetector CT imaging of the upper right extremity was performed according to the standard protocol following intravenous contrast administration. COMPARISON:  CT of the right elbow dated 09/09/2016 CONTRAST:  100 cc Isovue-300 FINDINGS: Bones/Joint/Cartilage The bones are osteopenic. No acute fracture for dislocation. Old healed right clavicular fracture noted. No periosteal elevation or erosive changes of the bone to suggest osteomyelitis. Evaluation however is limited due to osteopenia. MRI or a white blood cell nuclear scan may provide better evaluation if there is high clinical concern for osteomyelitis. Ligaments Suboptimally assessed by CT. Muscles and Tendons No acute findings.  No intramuscular hematoma. Soft tissues There is extensive diffuse subcutaneous edema. There is skin defect with open wound in the medial aspect of the distal arm. A second open wound noted in the dorsal aspect distal on approximately 7 cm above the elbow. Additional open wounds likely present but outside of the field of view. Subcutaneous air communicates with skin defect and extends superficial to the muscular fascia.  There has been significant interval increase in the amount of soft tissue air compared to the prior CT. No discrete drainable fluid collection or abscess identified. IMPRESSION: 1. Open skin wounds with extensive amount of soft tissue air dissecting through the subcutaneous fat superficial to the muscle fascia. There has been interval increase in the size of the soft tissue air compared to the prior CT. 2. No definite evidence of osteomyelitis by CT. MRI or a WBC nuclear scan may provide better evaluation if there is high clinical concern for osteomyelitis. 3. Diffuse subcutaneous soft tissue edema. No discrete drainable fluid collection or abscess identified. Electronically Signed   By: Anner Crete M.D.   On: 10/09/2016 03:07   - pertinent xrays, CT, MRI studies were reviewed and independently interpreted  Positive ROS: All other systems have been reviewed and were otherwise negative with the exception of those mentioned in the HPI and as above.  Physical Exam: General: Alert, no acute distress Cardiovascular: No pedal edema Respiratory: No cyanosis, no use of accessory musculature GI: No organomegaly, abdomen is soft  and non-tender Skin: No lesions in the area of chief complaint Neurologic: Sensation intact distally Psychiatric: Patient is competent for consent with normal mood and affect Lymphatic: No axillary or cervical lymphadenopathy  MUSCULOSKELETAL:  RUE - multiple wounds over the elbow, upper arm with eschar and fibrinous exudate - no cellulitis or drainage or signs of infection  Assessment: Chronic RUE wounds  Plan: - will need formal I&D in the OR today with VAC vs wet to dry dressings - NPO  Thank you for the consult and the opportunity to see Mr. Buelah Manis, MD [place] [phone] 7:53 AM

## 2016-10-09 NOTE — Progress Notes (Signed)
Pt sleeping soundly; wound vac appears to be working appropriately; suction @ 125 with no leak noted. Splint and ace wrap to RUE CDI.

## 2016-10-09 NOTE — Progress Notes (Signed)
Pharmacy Antibiotic Note  Andrew [name] is a 71 y.o. male admitted on 10/08/2016 with MRSA bacteremia and wound infection, c/o worsening appearance of wounds despite long ABX therapy.  Pharmacy consulted to continue vancomycin dosing that pt has been on as outpatient, started in hospital 1/15 w/ plan to continue until 2/26. I d/w [facility] pharmacist Elige Radon - pt has had multiple therapeutic vancomycin troughs on vancomycin 1250 mg q12h.  Vancomycin random this am = 21 mcg/ml.  Vanc dose given late last night prior to admission to ED.  Pt to go to OR today for I&D.   Plan: Continue vancomycin 1250 mg IV q12 for vancomycin trough goal 15 - 20 mcg/ml F/u intra-op cultures F/u LOT  Height: 5\' 8"  (172.7 cm) Weight: 147 lb (66.7 kg) IBW/kg (Calculated) : 68.4  Temp (24hrs), Avg:98.1 F (36.7 C), Min:97.9 F (36.6 C), Max:98.2 F (36.8 C)   Recent Labs Lab 10/09/16 0049 10/09/16 0831  WBC 14.1*  --   CREATININE 0.53*  --   VANCORANDOM  --  21    Estimated Creatinine Clearance: 79.9 mL/min (by C-G formula based on SCr of 0.53 mg/dL (L)).    Allergies  Allergen Reactions  . Penicillins Rash and Other (See Comments)    Tolerated Zosyn Jan 2018 Possibly rash? Has patient had a PCN reaction causing immediate rash, facial/tongue/throat swelling, SOB or lightheadedness with hypotension: YES Has patient had a PCN reaction causing severe rash involving mucus membranes or skin necrosis:NO Has patient had a PCN reaction that required hospitalization NO Has patient had a PCN reaction occurring within the last 10 years:NO If all of the above answers are "NO", then may proceed with Cephalosporin use.   Vanc 1/15>>>  2/5 as outpt VT = 10 at 1100 am 2/12 VT 16.5 creat 0.58 on 1250 q12 2/15 VT 13, creat 0.51,  vanc 1250 q12 2/19 VT 18, creat 0.42 vanc 1250 q12 2/22 VT 16.4 creat 0.43 on 1250 q12 2/23 Vanc random = 21 at 0830 am   Eudelia Bunch, Pharm.D. BP:[phone] 10/09/2016 10:19 AM

## 2016-10-09 NOTE — Anesthesia Preprocedure Evaluation (Signed)
Anesthesia Evaluation  Patient identified by MRN, date of birth, ID band Patient awake    Reviewed: Allergy & Precautions, NPO status   Airway Mallampati: II  TM Distance: >3 FB     Dental   Pulmonary Current Smoker,    breath sounds clear to auscultation       Cardiovascular hypertension, + Peripheral Vascular Disease and +CHF   Rhythm:Regular Rate:Normal     Neuro/Psych    GI/Hepatic negative GI ROS, Neg liver ROS,   Endo/Other  diabetes  Renal/GU Renal disease     Musculoskeletal   Abdominal   Peds  Hematology   Anesthesia Other Findings   Reproductive/Obstetrics                             Anesthesia Physical Anesthesia Plan  ASA: III  Anesthesia Plan: General   Post-op Pain Management:    Induction: Intravenous  Airway Management Planned: LMA  Additional Equipment:   Intra-op Plan:   Post-operative Plan: Extubation in OR  Informed Consent: I have reviewed the patients History and Physical, chart, labs and discussed the procedure including the risks, benefits and alternatives for the proposed anesthesia with the patient or authorized representative who has indicated his/her understanding and acceptance.   Dental advisory given  Plan Discussed with: CRNA and Anesthesiologist  Anesthesia Plan Comments:         Anesthesia Quick Evaluation

## 2016-10-09 NOTE — Consult Note (Signed)
[facility] Nurse wound consult note Reason for Consult: see note, bedside nurse reported pressure injury to the left buttock However after discussion and assessment appears to be related to "boil" that ruptured Wound type: non healing, full thickness wound Pressure Injury POA: No Measurement: 1cm x 1cm x 0.5cm  Wound bed: 90% pink, 10% yellow Drainage (amount, consistency, odor) none Periwound: intact Dressing procedure/placement/frequency: Add hydrogel for moist wound healing, cover with dry dressing. Change daily.  Discussed POC with patient and bedside nurse.  Re consult if needed, will not follow at this time. Thanks  Rodarius Kichline [employer], RN,CWOCN, CNS [phone])

## 2016-10-09 NOTE — Anesthesia Procedure Notes (Signed)
Procedure Name: LMA Insertion Date/Time: 10/09/2016 1:54 PM Performed by: Salli Quarry Micayla Brathwaite Pre-anesthesia Checklist: Patient identified, Emergency Drugs available, Suction available and Patient being monitored Patient Re-evaluated:Patient Re-evaluated prior to inductionOxygen Delivery Method: Circle System Utilized Preoxygenation: Pre-oxygenation with 100% oxygen Intubation Type: IV induction Ventilation: Mask ventilation without difficulty LMA: LMA inserted LMA Size: 5.0 Number of attempts: 1 Placement Confirmation: positive ETCO2 Tube secured with: Tape Dental Injury: Teeth and Oropharynx as per pre-operative assessment
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
## 2016-10-09 NOTE — Progress Notes (Signed)
Patient returned from OR to 2W room 26.  Patient underwent I and D of right arm wounds and arrived with wound vac to right arm.  On arrival there was noted to be bloody drainage seeping from underneath the right arm.  The transparent dressing over patient's surgical site and wound vac was leaking.  MD was called.  MD ordered ABD pads and Kerlix wrap around the site and site to be reinforced with stiff sling wrap.  Ortho arrived to place firm sling.  Will continue to monitor.

## 2016-10-09 NOTE — Op Note (Signed)
   Date of Surgery: 10/09/2016  INDICATIONS: Andrew [name] is a 72 y.o.-year-old male with a right upper arm wounds;  The patient did consent to the procedure after discussion of the risks and benefits.  PREOPERATIVE DIAGNOSIS: Right elbow and arm nonhealing and necrotic pressure ulcers  POSTOPERATIVE DIAGNOSIS: Same.  PROCEDURE:  1. Sharp excisional debridement of skin, muscle, fascia, bone right upper extremity 25 x 20 x 2 cm 2. Application of wound VAC greater than 50 cm  SURGEON: N. Eduard Roux, M.D.  ASSIST: None.  ANESTHESIA:  general  IV FLUIDS AND URINE: See anesthesia.  ESTIMATED BLOOD LOSS: 500 mL.  IMPLANTS: None  DRAINS: Wound VAC  COMPLICATIONS: None.  DESCRIPTION OF PROCEDURE: The patient was brought to the operating room and placed supine on the operating table.  The patient had been signed prior to the procedure and this was documented. The patient had the anesthesia placed by the anesthesiologist.  A time-out was performed to confirm that this was the correct patient, site, side and location. The patient did receive antibiotics prior to the incision and was re-dosed during the procedure as needed at indicated intervals.  The patient had the operative extremity prepped and draped in the standard surgical fashion.    After examining the right upper extremity the patient had multiple necrotic and nonhealing pressure ulcers with gross infection that all communicated and tunneled together. I performed sharp excisional debridement of the right upper extremity including bone, skin, muscle, fascia with a combination of knife, rongeur, curette. I essentially debrided the entire posterior half of his right upper arm.  After adequate and thorough debridement hemostasis was obtained. The wound was then thoroughly irrigated with normal saline. I measured the wound to be the above-mentioned dimensions. A large wound VAC sponge was then placed in the wound in order to cover the soft  tissue defect. The wound VAC was then turned to -125 mmHg.  He was placed in a sling. The patient tolerated the procedure well and had no immediate complications.  POSTOPERATIVE PLAN: The patient will need to return back to the operating room on Sunday for repeat washout. He will eventually need consultation with plastic surgery for soft tissue coverage. He needs to remain on antibiotics until his tissues have stabilized.  Andrew Cecil, MD [place] 3:09 PM

## 2016-10-09 NOTE — ED Notes (Signed)
Pt requesting a Nicotine patch, provider to be notified.

## 2016-10-09 NOTE — Progress Notes (Signed)
Blood sugar 69. MD on call paged

## 2016-10-09 NOTE — H&P (Signed)
History and Physical    AMARDEEP BECKERS OZH:[id number] DOB: [birth year] DOA: 10/08/2016   PCP: Woody Seller, MD Chief Complaint:  Chief Complaint  Patient presents with  . Wound Check    HPI: DARTAGNAN BEAVERS is a 71 y.o. male with medical history significant of DM, HTN, recent admit in Jan for olecranon bursitis, MRSA bacteremia which was high grade with 2/2 cultures coming back positive on the 14th and 1/2 positive on the 16th.  Patient with murmur and possible MV vegetation refused TEE.  He was discharged on IV vanc with plans to complete 6 weeks of therapy which was to be finished 3 days from now.  Unfortunately his R elbow wounds are looking worse per his wound care RN.  No increasing pain to wounds but has had drainage from them.  He presents to ED.  ED Course: Wounds look worse, WBC 14k  EDP thinks he needs OR drainage.  Review of Systems: As per HPI otherwise 10 point review of systems negative.    Past Medical History:  Diagnosis Date  . Blind right eye   . Diabetes mellitus without complication ([place])   . Hyperlipemia   . Hypertension   . 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 No pertinent surgical history.   reports that he has been smoking Cigarettes.  He has been smoking about 1.50 packs per day. He has never used smokeless tobacco. He reports that he drinks about 16.8 oz of alcohol per week . His drug history is not on file.  Allergies  Allergen Reactions  . Penicillins Rash and Other (See Comments)    Tolerated Zosyn Jan 2018 Possibly rash? Has patient had a PCN reaction causing immediate rash, facial/tongue/throat swelling, SOB or lightheadedness with hypotension: YES Has patient had a PCN reaction causing severe rash involving mucus membranes or skin necrosis:NO Has patient had a PCN reaction that required hospitalization NO Has patient had a PCN reaction occurring within the last 10 years:NO If all of  the above answers are "NO", then may proceed with Cephalosporin use.    Family History  Problem Relation Age of Onset  . Diabetes Mellitus II Mother   . Diabetes Mellitus II Father   . Diabetes Mellitus II Sister   . Diabetes Mellitus II Sister       Prior to Admission medications   Medication Sig Start Date End Date Taking? Authorizing Provider  amLODipine (NORVASC) 10 MG tablet Take 1 tablet (10 mg total) by mouth daily. 04/15/15   Eugenie Filler, MD  aspirin 325 MG tablet Take 325 mg by mouth daily.    Historical Provider, MD  feeding supplement, ENSURE ENLIVE, (ENSURE ENLIVE) LIQD Take 237 mLs by mouth daily. 09/15/16   Oswald Hillock, MD  gabapentin (NEURONTIN) 100 MG capsule Take 100 mg by mouth 3 (three) times daily.    Historical Provider, MD  insulin aspart (NOVOLOG) 100 UNIT/ML injection Sliding scale insulin Less than 70 initiate hypoglycemia protocol 70-120  0 units 120-150 1 unit 151-200 2 units 201-250 3 units 251-300 5 units 301-350 7 units 351-400 9 units  Greater than 400 call MD 09/15/16   Oswald Hillock, MD  insulin glargine (LANTUS) 100 UNIT/ML injection Inject 0.07 mLs (7 Units total) into the skin at bedtime. 09/15/16   Oswald Hillock, MD  JANUVIA 100 MG tablet Take 100 mg by mouth daily. 02/07/15   Historical Provider, MD  metFORMIN (GLUCOPHAGE) 500 MG tablet  Take 1,000 mg by mouth 2 (two) times daily with a meal.    Historical Provider, MD  metoprolol succinate (TOPROL-XL) 100 MG 24 hr tablet Take 1 tablet (100 mg total) by mouth daily. Take with or immediately following a meal. 09/16/16   Oswald Hillock, MD  simvastatin (ZOCOR) 40 MG tablet Take 40 mg by mouth daily.    Historical Provider, MD  thiamine 100 MG tablet Take 1 tablet (100 mg total) by mouth daily. 09/16/16   Oswald Hillock, MD  vancomycin IVPB Inject 750 mg into the vein every 12 (twelve) hours. Indication: MRSA bacteremia Last Day of Therapy:  10/12/16 Labs - Sunday/Monday:  CBC/D, BMP, and vancomycin  trough. Labs - Thursday:  BMP and vancomycin trough Labs - Every other week:  ESR and CRP 09/15/16 10/12/16  Oswald Hillock, MD    Physical Exam: Vitals:   10/09/16 0415 10/09/16 0441 10/09/16 0500 10/09/16 0545  BP: 167/74 167/74 161/69 165/72  Pulse: 70 70 69 71  Resp:  16    Temp:  97.9 F (36.6 C)    TempSrc:  Oral    SpO2: 97% 98% 96% 98%  Weight:      Height:          Constitutional: NAD, calm, comfortable Eyes: PERRL, lids and conjunctivae normal ENMT: Mucous membranes are moist. Posterior pharynx clear of any exudate or lesions.Normal dentition.  Neck: normal, supple, no masses, no thyromegaly Respiratory: clear to auscultation bilaterally, no wheezing, no crackles. Normal respiratory effort. No accessory muscle use.  Cardiovascular: Regular rate and rhythm, 3/6 murmur heard Abdomen: no tenderness, no masses palpated. No hepatosplenomegaly. Bowel sounds positive.  Musculoskeletal: no clubbing / cyanosis. No joint deformity upper and lower extremities. Good ROM, no contractures. Normal muscle tone.  Skin: 3 large cavitary wounds, no significant surrounding erythema, to R elbow (one over olecranon process (~3cm diameter), 10 cm lateral proximal, and 2 cm medial; purulent drainage, probable tracking  Neurologic: CN 2-12 grossly intact. Sensation intact, DTR normal. Strength 5/5 in all 4.  Psychiatric: Normal judgment and insight. Alert and oriented x 3. Normal mood.    Labs on Admission: I have personally reviewed following labs and imaging studies  CBC:  Recent Labs Lab 10/09/16 0049  WBC 14.1*  NEUTROABS 8.7*  HGB 11.0*  HCT 34.1*  MCV 88.3  PLT 440*   Basic Metabolic Panel:  Recent Labs Lab 10/09/16 0049  NA 137  K 4.1  CL 101  CO2 26  GLUCOSE 166*  BUN 9  CREATININE 0.53*  CALCIUM 9.1   GFR: Estimated Creatinine Clearance: 79.9 mL/min (by C-G formula based on SCr of 0.53 mg/dL (L)). Liver Function Tests: No results for input(s): AST, ALT, ALKPHOS,  BILITOT, PROT, ALBUMIN in the last 168 hours. No results for input(s): LIPASE, AMYLASE in the last 168 hours. No results for input(s): AMMONIA in the last 168 hours. Coagulation Profile: No results for input(s): INR, PROTIME in the last 168 hours. Cardiac Enzymes: No results for input(s): CKTOTAL, CKMB, CKMBINDEX, TROPONINI in the last 168 hours. BNP (last 3 results) No results for input(s): PROBNP in the last 8760 hours. HbA1C: No results for input(s): HGBA1C in the last 72 hours. CBG: No results for input(s): GLUCAP in the last 168 hours. Lipid Profile: No results for input(s): CHOL, HDL, LDLCALC, TRIG, CHOLHDL, LDLDIRECT in the last 72 hours. Thyroid Function Tests: No results for input(s): TSH, T4TOTAL, FREET4, T3FREE, THYROIDAB in the last 72 hours. Anemia Panel: No results  for input(s): VITAMINB12, FOLATE, FERRITIN, TIBC, IRON, RETICCTPCT in the last 72 hours. Urine analysis:    Component Value Date/Time   COLORURINE YELLOW 08/30/2016 1926   APPEARANCEUR HAZY (A) 08/30/2016 1926   LABSPEC 1.020 08/30/2016 1926   PHURINE 5.0 08/30/2016 1926   GLUCOSEU >=500 (A) 08/30/2016 1926   HGBUR MODERATE (A) 08/30/2016 1926   BILIRUBINUR NEGATIVE 08/30/2016 1926   KETONESUR 20 (A) 08/30/2016 1926   PROTEINUR 100 (A) 08/30/2016 1926   UROBILINOGEN 0.2 04/18/2015 0210   NITRITE NEGATIVE 08/30/2016 1926   LEUKOCYTESUR NEGATIVE 08/30/2016 1926   Sepsis Labs: @LABRCNTIP (procalcitonin:4,lacticidven:4) )No results found for this or any previous visit (from the past 240 hour(s)).   Radiological Exams on Admission: Dg Ribs Unilateral W/chest Right  Result Date: 10/09/2016 CLINICAL DATA:  Right-sided rib pain EXAM: RIGHT RIBS AND CHEST - 3+ VIEW COMPARISON:  Chest radiograph 09/10/2016 FINDINGS: There is a left-sided approach PICC line with tip at the cavoatrial junction. Unchanged cardiomegaly with atherosclerotic calcification in the aortic arch. No pneumothorax or sizable pleural  effusion. Diffusely increased pulmonary markings. No focal consolidation. No rib fracture is identified. IMPRESSION: 1. No rib fracture. 2. Cardiomegaly and aortic atherosclerosis. Electronically Signed   By: Ulyses Jarred M.D.   On: 10/09/2016 01:01   Ct Humerus Right W Contrast  Result Date: 10/09/2016 CLINICAL DATA:  71 year old male with right arm wounds with current drainage and surrounding erythema. EXAM: CT OF THE UPPER RIGHT EXTREMITY WITH CONTRAST TECHNIQUE: Multidetector CT imaging of the upper right extremity was performed according to the standard protocol following intravenous contrast administration. COMPARISON:  CT of the right elbow dated 09/09/2016 CONTRAST:  100 cc Isovue-300 FINDINGS: Bones/Joint/Cartilage The bones are osteopenic. No acute fracture for dislocation. Old healed right clavicular fracture noted. No periosteal elevation or erosive changes of the bone to suggest osteomyelitis. Evaluation however is limited due to osteopenia. MRI or a white blood cell nuclear scan may provide better evaluation if there is high clinical concern for osteomyelitis. Ligaments Suboptimally assessed by CT. Muscles and Tendons No acute findings.  No intramuscular hematoma. Soft tissues There is extensive diffuse subcutaneous edema. There is skin defect with open wound in the medial aspect of the distal arm. A second open wound noted in the dorsal aspect distal on approximately 7 cm above the elbow. Additional open wounds likely present but outside of the field of view. Subcutaneous air communicates with skin defect and extends superficial to the muscular fascia. There has been significant interval increase in the amount of soft tissue air compared to the prior CT. No discrete drainable fluid collection or abscess identified. IMPRESSION: 1. Open skin wounds with extensive amount of soft tissue air dissecting through the subcutaneous fat superficial to the muscle fascia. There has been interval increase in  the size of the soft tissue air compared to the prior CT. 2. No definite evidence of osteomyelitis by CT. MRI or a WBC nuclear scan may provide better evaluation if there is high clinical concern for osteomyelitis. 3. Diffuse subcutaneous soft tissue edema. No discrete drainable fluid collection or abscess identified. Electronically Signed   By: Anner Crete M.D.   On: 10/09/2016 03:07    EKG: Independently reviewed.  Assessment/Plan Principal Problem:   Septic olecranon bursitis of right elbow Active Problems:   Hypertension   Bacteremia due to methicillin resistant Staphylococcus aureus   Pressure injury of skin    1. Septic olecranon bursitis of R elbow - 1. Wound care consult 2. Call ortho in  AM, suspect they will do I+D washout possibly today, to help facilitate this: 1. Will keep patient NPO 2. SCDs only for DVT ppx 3. Given prior MRSA bacteremia, will continue vanc for the moment under the assumption that this represents a failure due to walled off fluid collection as opposed to new organism or resistance. 1. Ideally ortho to get tissue / abscess culture in OR 4. Repeating blood cultures at this time 5. Will repeat 2d echo to re-evaluate valve and see if there is any change suggestive of endocarditis 6. Depending on BCx findings, I+D findings, 2d echo findings, may well warrant repeat ID consult. 2. DM2 - 1. Sensitive scale SSI Q4H while NPO 3. HTN - continue home meds   DVT prophylaxis: SCDs Code Status: Full Family Communication: No family in room Consults called: None Admission status: Admit to inpatient   Etta Quill DO [facility] Pager [phone] from 7PM-7AM  If 7AM-7PM, please contact the day physician for the patient [email or web address] Password [email or web address]  10/09/2016, 6:06 AM

## 2016-10-09 NOTE — Progress Notes (Signed)
Patient arrived from ED with arm infection right. Stage left sacrum and several open sores on the arms.

## 2016-10-10 ENCOUNTER — Inpatient Hospital Stay (HOSPITAL_COMMUNITY): Payer: Medicare Other

## 2016-10-10 DIAGNOSIS — I5030 Unspecified diastolic (congestive) heart failure: Secondary | ICD-10-CM

## 2016-10-10 DIAGNOSIS — E119 Type 2 diabetes mellitus without complications: Secondary | ICD-10-CM

## 2016-10-10 DIAGNOSIS — L98494 Non-pressure chronic ulcer of skin of other sites with necrosis of bone: Secondary | ICD-10-CM

## 2016-10-10 DIAGNOSIS — I10 Essential (primary) hypertension: Secondary | ICD-10-CM

## 2016-10-10 DIAGNOSIS — R7881 Bacteremia: Secondary | ICD-10-CM

## 2016-10-10 DIAGNOSIS — Z794 Long term (current) use of insulin: Secondary | ICD-10-CM

## 2016-10-10 LAB — BASIC METABOLIC PANEL
ANION GAP: 8 (ref 5–15)
BUN: 12 mg/dL (ref 6–20)
CALCIUM: 8.2 mg/dL — AB (ref 8.9–10.3)
CO2: 28 mmol/L (ref 22–32)
CREATININE: 0.55 mg/dL — AB (ref 0.61–1.24)
Chloride: 98 mmol/L — ABNORMAL LOW (ref 101–111)
GFR calc Af Amer: >60 mL/min (ref >60)
GFR calc non Af Amer: >60 mL/min (ref >60)
GLUCOSE: 189 mg/dL — AB (ref 65–99)
Potassium: 4 mmol/L (ref 3.5–5.1)
Sodium: 134 mmol/L — ABNORMAL LOW (ref 135–145)

## 2016-10-10 LAB — GLUCOSE, CAPILLARY
GLUCOSE-CAPILLARY: 250 mg/dL — AB (ref 65–99)
GLUCOSE-CAPILLARY: 288 mg/dL — AB (ref 65–99)
Glucose-Capillary: 181 mg/dL — ABNORMAL HIGH (ref 65–99)
Glucose-Capillary: 307 mg/dL — ABNORMAL HIGH (ref 65–99)
Glucose-Capillary: 154 mg/dL — ABNORMAL HIGH (ref 65–99)

## 2016-10-10 LAB — ACID FAST SMEAR (AFB): ACID FAST SMEAR - AFSCU2: NEGATIVE

## 2016-10-10 LAB — CBC
HCT: 24.5 % — ABNORMAL LOW (ref 39.0–52.0)
HEMOGLOBIN: 7.9 g/dL — AB (ref 13.0–17.0)
MCH: 28.5 pg (ref 26.0–34.0)
MCHC: 32.2 g/dL (ref 30.0–36.0)
MCV: 88.4 fL (ref 78.0–100.0)
Platelets: 379 10*3/uL (ref 150–400)
RBC: 2.77 MIL/uL — ABNORMAL LOW (ref 4.22–5.81)
RDW: 15.4 % (ref 11.5–15.5)
WBC: 11.1 10*3/uL — ABNORMAL HIGH (ref 4.0–10.5)

## 2016-10-10 LAB — ECHOCARDIOGRAM COMPLETE
HEIGHTINCHES: 68 in
WEIGHTICAEL: 2352 [oz_av]

## 2016-10-10 LAB — ACID FAST SMEAR (AFB, MYCOBACTERIA)

## 2016-10-10 MED ORDER — CEFEPIME HCL 1 G IJ SOLR
1.0000 g | Freq: Three times a day (TID) | INTRAMUSCULAR | Status: DC
  Administered 2016-10-10 – 2016-10-11 (×2): 1 g via INTRAVENOUS
  Filled 2016-10-10 (×4): qty 1

## 2016-10-10 MED ORDER — INSULIN GLARGINE 100 UNIT/ML ~~LOC~~ SOLN: 7.0000 [IU] | Freq: Every day | SUBCUTANEOUS | Status: DC

## 2016-10-10 MED ORDER — INSULIN GLARGINE 100 UNIT/ML ~~LOC~~ SOLN
7.0000 [IU] | Freq: Every day | SUBCUTANEOUS | Status: DC
  Administered 2016-10-10: 7 [IU] via SUBCUTANEOUS
  Filled 2016-10-10 (×2): qty 0.07

## 2016-10-10 NOTE — Progress Notes (Signed)
   Subjective:  Patient reports pain as mild.    Objective:   VITALS:   Vitals:   10/09/16 1545 10/09/16 1600 10/09/16 2033 10/10/16 0435  BP: 129/65 121/63 (!) 149/95 (!) 157/95  Pulse: 64 67 74 70  Resp: 11 17 18 16   Temp:  98.1 F (36.7 C) 98.2 F (36.8 C) 98.5 F (36.9 C)  TempSrc:   Oral Oral  SpO2: 99% 100% 100% 100%  Weight:      Height:        VAC with good seal and suction Well fitting long arm splint Hand, fingers wwp   Lab Results  Component Value Date   WBC 11.1 (H) 10/10/2016   HGB 7.9 (L) 10/10/2016   HCT 24.5 (L) 10/10/2016   MCV 88.4 10/10/2016   PLT 379 10/10/2016     Assessment/Plan:  1 Day Post-Op   - Expected postop acute blood loss anemia - will monitor for symptoms - patient refusing insulin unless CBGs above 300 - at increased risk for poor wound healing and outcome - repeat I&D Sunday, will eventually need plastic surgery consultation once wound is stable - NPO after midnight  Eduard Roux 10/10/2016, 8:45 AM [phone]

## 2016-10-10 NOTE — H&P (Signed)

## 2016-10-10 NOTE — Progress Notes (Signed)
Pt refusing SSI unless CBGs over 300. Educated pt on importance of blood glucose control and wound healing, pt states that if he takes insulin if his CBGs are under 300, he will "bottom out" and will only take it if CBG is over 300. Will continue to monitor.  Jaymes Graff, RN

## 2016-10-10 NOTE — Anesthesia Postprocedure Evaluation (Addendum)
Anesthesia Post Note  Patient: Andrew [name]  Procedure(s) Performed: Procedure(s) (LRB): IRRIGATION AND DEBRIDEMENT EXTREMITY RIGHT ARM WOUND (Right) APPLICATION OF WOUND VAC (Right)  Patient location during evaluation: PACU Anesthesia Type: General Level of consciousness: awake and alert Pain management: pain level controlled Vital Signs Assessment: post-procedure vital signs reviewed and stable Respiratory status: spontaneous breathing, nonlabored ventilation, respiratory function stable and patient connected to nasal cannula oxygen Cardiovascular status: blood pressure returned to baseline and stable Postop Assessment: no signs of nausea or vomiting Anesthetic complications: no       Last Vitals:  Vitals:   10/10/16 0435 10/10/16 1323  BP: (!) 157/95 (!) 124/52  Pulse: 70 72  Resp: 16 18  Temp: 36.9 C 36.9 C    Last Pain:  Vitals:   10/10/16 1323  TempSrc: Oral  PainSc:                  Nadelyn Enriques

## 2016-10-10 NOTE — Progress Notes (Addendum)
PROGRESS NOTE        PATIENT DETAILS Name: Andrew [name] Age: 71 y.o. Sex: male Date of Birth: Jan 22, 1946 Admit Date: 10/08/2016 Admitting Physician Etta Quill, DO BTD:HRCBUL,AGTX HENRY, MD  Brief Narrative: Patient is a 71 y.o. male with recent prolonged hospitalization for MRSA bacteremia felt to be secondary to olecranon bursitis sent home on IV vancomycin, referred back to the hospital on 2/22 by home health RN for worsening right elbow/right arm wounds. He was subsequently admitted for further evaluation and treatment.  Subjective: Lying comfortably in bed-he is worried that he may lose his right arm.  Assessment/Plan: Right elbow and right arm necrotic ulceration-with recent history of olecranon bursitis: Underwent excisional debridement and wound VAC placement on 2/23-Continue empiric IV vancomycin-await intraoperative cultures-but Gram stain positive for gram-negative rods-therefore add IV cefepime. Orthopedics planning on repeat I&D on 2/25 Blood cultures on 2/23-negative so far.  Recent history of MRSA bacteremia: Has mild leukocytosis-but afebrile and does not appear toxic. Continue IV vancomycin, blood cultures on 2/23 negative so far. Per last discharge summary-stop date 10/12/16. We will discuss with infectious disease.  Insulin-dependent type 2 diabetes: Did have mild hypoglycemia on 2/23-but sugars now and the 200s-300s range-patient refusing sliding scale insulin-have counseled extensively regarding importance of tight glycemic control in a setting of sepsis/bacteremia/skin infection. Restart Lantus, continue sliding scale. We will continue to follow and optimize accordingly.  Chronic diastolic heart failure: Clinically compensated, follow clinically.  Hypertension: BP relatively well controlled-continue with metoprolol and amlodipine.  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  History of dysphagia: Per last discharge summary he was started on a dysphagia 2 diet-repeat speech therapy evaluation completed-recommendations are for regular diet.   Tobacco abuse: Continue transdermal nicotine. I do not think he has any inclination to quit at this time.  DVT Prophylaxis: SCD's-resume pharmacological prophylaxis after surgical intervention is completed.  Code Status: Full code   Family Communication: None at bedside  Disposition Plan: Remain inpatient-but will plan on resumption of Home health on discharge-but may require SNF on discharge  Antimicrobial agents: Anti-infectives    Start     Dose/Rate Route Frequency Ordered Stop   10/09/16 1715  vancomycin IVPB  Status:  Discontinued    Comments:  Indication: MRSA bacteremia Last Day of Therapy:  10/12/16 Labs - Sunday/Monday:  CBC/D, BMP, and vancomycin trough. Labs - Thursday:  BMP and vancomycin trough Labs - Every other week:  ESR and CRP     75 0 mg Intravenous Every 12 hours 10/09/16 1701 10/09/16 1707   10/09/16 1200  vancomycin (VANCOCIN) 1,250 mg in sodium chloride 0.9 % 250 mL IVPB     1,250 mg 166.7 mL/hr over 90 Minutes Intravenous Every 12 hours 10/09/16 1002        Procedures: None  CONSULTS:  orthopedic surgery  Time spent: 25- minutes-Greater than 50% of this time was spent in counseling, explanation of diagnosis, planning of further management, and coordination of care.  MEDICATIONS: Scheduled Meds: . amLODipine  10 mg Oral Daily  . aspirin  325 mg Oral Daily  . atorvastatin  20 mg Oral q1800  . Chlorhexidine Gluconate Cloth  6 each Topical Q0600  . feeding supplement (ENSURE ENLIVE)  237 mL Oral Q24H  . gabapentin  100 mg  Oral TID  . insulin aspart  0-9 Units Subcutaneous Q4H  . metoprolol succinate  100 mg Oral Daily  . nicotine  21 mg Transdermal Daily  . nystatin   Topical TID  . sodium chloride flush  3 mL Intravenous Q12H  . thiamine  100 mg  Oral Daily  . vancomycin  1,250 mg Intravenous Q12H   Continuous Infusions: . dextrose 5 % and 0.9% NaCl 40 mL/hr at 10/09/16 2016   PRN Meds:.sodium chloride flush   PHYSICAL EXAM: Vital signs: Vitals:   10/09/16 1545 10/09/16 1600 10/09/16 2033 10/10/16 0435  BP: 129/65 121/63 (!) 149/95 (!) 157/95  Pulse: 64 67 74 70  Resp: 11 17 18 16   Temp:  98.1 F (36.7 C) 98.2 F (36.8 C) 98.5 F (36.9 C)  TempSrc:   Oral Oral  SpO2: 99% 100% 100% 100%  Weight:      Height:       Filed Weights   10/09/16 0152  Weight: 66.7 kg (147 lb)   Body mass index is 22.35 kg/m.   General appearance :Awake, alert, not in any distress. Speech Clear.  Eyes:, pupils equally reactive to light and accomodation,no scleral icterus. HEENT: Atraumatic and Normocephalic Neck: supple, no JVD. No cervical lymphadenopathy.  Resp:Good air entry bilaterally CVS: S1 S2 regular GI: Bowel sounds present, Non tender and not distended with no gaurding, rigidity or rebound. Extremities: B/L Lower Ext shows no edema, both legs are warm to touch.Right arm dressing in place-did not open. Neurology:  speech clear,Non focal, sensation is grossly intact. Psychiatric: Normal judgment and insight. Alert and oriented x 3. Normal mood. Musculoskeletal:No digital cyanosis Skin:No Rash, warm and dry Wounds:N/A  I have personally reviewed following labs and imaging studies  LABORATORY DATA: CBC:  Recent Labs Lab 10/09/16 0049 10/10/16 0420  WBC 14.1* 11.1*  NEUTROABS 8.7*  --   HGB 11.0* 7.9*  HCT 34.1* 24.5*  MCV 88.3 88.4  PLT 415* 034    Basic Metabolic Panel:  Recent Labs Lab 10/09/16 0049 10/10/16 0420  NA 137 134*  K 4.1 4.0  CL 101 98*  CO2 26 28  GLUCOSE 166* 189*  BUN 9 12  CREATININE 0.53* 0.55*  CALCIUM 9.1 8.2*    GFR: Estimated Creatinine Clearance: 79.9 mL/min (by C-G formula based on SCr of 0.55 mg/dL (L)).  Liver Function Tests: No results for input(s): AST, ALT, ALKPHOS,  BILITOT, PROT, ALBUMIN in the last 168 hours. No results for input(s): LIPASE, AMYLASE in the last 168 hours. No results for input(s): AMMONIA in the last 168 hours.  Coagulation Profile: No results for input(s): INR, PROTIME in the last 168 hours.  Cardiac Enzymes: No results for input(s): CKTOTAL, CKMB, CKMBINDEX, TROPONINI in the last 168 hours.  BNP (last 3 results) No results for input(s): PROBNP in the last 8760 hours.  HbA1C: No results for input(s): HGBA1C in the last 72 hours.  CBG:  Recent Labs Lab 10/09/16 1504 10/09/16 2058 10/10/16 0025 10/10/16 0505 10/10/16 1114  GLUCAP 197* 206* 250* 181* 288*    Lipid Profile: No results for input(s): CHOL, HDL, LDLCALC, TRIG, CHOLHDL, LDLDIRECT in the last 72 hours.  Thyroid Function Tests: No results for input(s): TSH, T4TOTAL, FREET4, T3FREE, THYROIDAB in the last 72 hours.  Anemia Panel: No results for input(s): VITAMINB12, FOLATE, FERRITIN, TIBC, IRON, RETICCTPCT in the last 72 hours.  Urine analysis:    Component Value Date/Time   COLORURINE YELLOW 08/30/2016 1926   APPEARANCEUR HAZY (A) 08/30/2016  1926   LABSPEC 1.020 08/30/2016 1926   PHURINE 5.0 08/30/2016 1926   GLUCOSEU >=500 (A) 08/30/2016 1926   HGBUR MODERATE (A) 08/30/2016 1926   BILIRUBINUR NEGATIVE 08/30/2016 1926   KETONESUR 20 (A) 08/30/2016 1926   PROTEINUR 100 (A) 08/30/2016 1926   UROBILINOGEN 0.2 04/18/2015 0210   NITRITE NEGATIVE 08/30/2016 1926   LEUKOCYTESUR NEGATIVE 08/30/2016 1926    Sepsis Labs: Lactic Acid, Venous    Component Value Date/Time   LATICACIDVEN 0.9 09/08/2016 1453    MICROBIOLOGY: Recent Results (from the past 240 hour(s))  Culture, blood (routine x 2)     Status: None (Preliminary result)   Collection Time: 10/09/16 12:55 AM  Result Value Ref Range Status   Specimen Description BLOOD RIGHT HAND  Final   Special Requests IN PEDIATRIC BOTTLE 3ML  Final   Culture NO GROWTH 1 DAY  Final   Report Status  PENDING  Incomplete  Culture, blood (routine x 2)     Status: None (Preliminary result)   Collection Time: 10/09/16  6:08 AM  Result Value Ref Range Status   Specimen Description BLOOD RIGHT HAND  Final   Special Requests BOTTLES DRAWN AEROBIC AND ANAEROBIC  5CC  Final   Culture NO GROWTH 1 DAY  Final   Report Status PENDING  Incomplete  MRSA PCR Screening     Status: None   Collection Time: 10/09/16  9:48 AM  Result Value Ref Range Status   MRSA by PCR NEGATIVE NEGATIVE Final    Comment:        The GeneXpert MRSA Assay (FDA approved for NASAL specimens only), is one component of a comprehensive MRSA colonization surveillance program. It is not intended to diagnose MRSA infection nor to guide or monitor treatment for MRSA infections.   Aerobic/Anaerobic Culture (surgical/deep wound)     Status: None (Preliminary result)   Collection Time: 10/09/16  2:10 PM  Result Value Ref Range Status   Specimen Description TISSUE RIGHT ARM  Final   Special Requests PATIENT ON FOLLOWING VANC  Final   Gram Stain   Final    MODERATE WBC PRESENT, PREDOMINANTLY PMN NO ORGANISMS SEEN    Culture   Final    RARE GRAM NEGATIVE RODS CRITICAL RESULT CALLED TO, READ BACK BY AND VERIFIED WITH: T PHILLIP,RN AT 1036 10/10/16 BY L BENFIELD CONCERNING GROWTH ON CULTURE    Report Status PENDING  Incomplete    RADIOLOGY STUDIES/RESULTS: Dg Ribs Unilateral W/chest Right  Result Date: 10/09/2016 CLINICAL DATA:  Right-sided rib pain EXAM: RIGHT RIBS AND CHEST - 3+ VIEW COMPARISON:  Chest radiograph 09/10/2016 FINDINGS: There is a left-sided approach PICC line with tip at the cavoatrial junction. Unchanged cardiomegaly with atherosclerotic calcification in the aortic arch. No pneumothorax or sizable pleural effusion. Diffusely increased pulmonary markings. No focal consolidation. No rib fracture is identified. IMPRESSION: 1. No rib fracture. 2. Cardiomegaly and aortic atherosclerosis. Electronically Signed   By:  Ulyses Jarred M.D.   On: 10/09/2016 01:01   Dg Elbow 2 Views Right  Result Date: 10/09/2016 CLINICAL DATA:  Right elbow pain for 1 month.  Multiple ulcers. EXAM: RIGHT ELBOW - 2 VIEW COMPARISON:  CT from earlier today FINDINGS: Extensive soft tissue gas and swelling in the arm. Extent is greater than seen on this exam, but was covered on prior CT. No visible progression from prior. There are multiple open wounds per report. A large ulcer noted over olecranon spurring. Findings suggest necrotizing infection. Osteopenia. No visible infectious bony  erosion. Possible joint effusion, limited by overlapping gas bubbles. Patient has already been seen by surgery and there are plans for OR debridement. IMPRESSION: 1. Diffuse soft tissue gas and swelling in the arm as seen with necrotizing infection. No visible progression compared to preceding CT. 2. Large ulcer over an olecranon enthesophyte, possible exposed bone. Electronically Signed   By: Monte Fantasia M.D.   On: 10/09/2016 09:57   Ct Humerus Right W Contrast  Result Date: 10/09/2016 CLINICAL DATA:  71 year old male with right arm wounds with current drainage and surrounding erythema. EXAM: CT OF THE UPPER RIGHT EXTREMITY WITH CONTRAST TECHNIQUE: Multidetector CT imaging of the upper right extremity was performed according to the standard protocol following intravenous contrast administration. COMPARISON:  CT of the right elbow dated 09/09/2016 CONTRAST:  100 cc Isovue-300 FINDINGS: Bones/Joint/Cartilage The bones are osteopenic. No acute fracture for dislocation. Old healed right clavicular fracture noted. No periosteal elevation or erosive changes of the bone to suggest osteomyelitis. Evaluation however is limited due to osteopenia. MRI or a white blood cell nuclear scan may provide better evaluation if there is high clinical concern for osteomyelitis. Ligaments Suboptimally assessed by CT. Muscles and Tendons No acute findings.  No intramuscular hematoma.  Soft tissues There is extensive diffuse subcutaneous edema. There is skin defect with open wound in the medial aspect of the distal arm. A second open wound noted in the dorsal aspect distal on approximately 7 cm above the elbow. Additional open wounds likely present but outside of the field of view. Subcutaneous air communicates with skin defect and extends superficial to the muscular fascia. There has been significant interval increase in the amount of soft tissue air compared to the prior CT. No discrete drainable fluid collection or abscess identified. IMPRESSION: 1. Open skin wounds with extensive amount of soft tissue air dissecting through the subcutaneous fat superficial to the muscle fascia. There has been interval increase in the size of the soft tissue air compared to the prior CT. 2. No definite evidence of osteomyelitis by CT. MRI or a WBC nuclear scan may provide better evaluation if there is high clinical concern for osteomyelitis. 3. Diffuse subcutaneous soft tissue edema. No discrete drainable fluid collection or abscess identified. Electronically Signed   By: Anner Crete M.D.   On: 10/09/2016 03:07   Dg Swallowing Func-speech Pathology  Result Date: 09/10/2016 Objective Swallowing Evaluation: Type of Study: MBS-Modified Barium Swallow Study Patient Details Name: RAYQUAN AMRHEIN MRN: [id number] Date of Birth: [birth year] Today's Date: 09/10/2016 Time: SLP Start Time (ACUTE ONLY): 1510-SLP Stop Time (ACUTE ONLY): 1529 SLP Time Calculation (min) (ACUTE ONLY): 19 min Past Medical History: Past Medical History: Diagnosis Date . Blind right eye  . Diabetes mellitus without complication ([place])  . Hyperlipemia  . Hypertension  . 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 HPI: 71 y.o.malewith hypertension, hyperlipidemia, diabetes mellitus, anxiety, AAA 4.8 cm on MRI (04/09/15), right eye blindness, anddCHF. Presented  to the ED on 08/30/2016 with two days chest pain, SOB and right elbow pain. In the ED pt was found to have WBC 24.8, lactate of 4.85, troponin 0.07, acute renal injury with Cr 1.28, hyponatremia with sodium 129. Found to have LLL PNA and MRSA bacteremia secondary to olecranon bursitis of his right elbow withpossible mitral valve vegetation per echo. Clinical swallow eval on 1/17 with recs for regular diet, thin liquds.  On 1/23 pt was noted to have new fever,  T Max of 100, with WBC increase to 28.2., and new LLL infiltrate on CXR. He has required increased oxygen support to NRB at 15 L.Pt transfered to ICU same date.  Has been refusing oxygen, further testing, as well as any discussions re: code status.  SLP reconsulted for formal swallowing evaluation due to ongoing aspiration concerns.  Subjective: Irritable, but willing to participate in assessment Assessment / Plan / Recommendation CHL IP CLINICAL IMPRESSIONS 09/10/2016 Therapy Diagnosis Mild oral phase dysphagia;Mild pharyngeal phase dysphagia Clinical Impression Pt has a mild oropharyngeal dysphagia with impaired mastication and oral clearance with solid foods, which may not be far from his baseline given condition of dentition. He has incomplete airway closure, allowing thin liquids to spill into the airway during the swallow. Penetration is deep and silent, although it does not go all the way to the vocal folds. SLP provided Mod cues to manage his impulsivity with intake, cough to clear penetrates, and utilize postural maneuvers, but pt did not or would not follow these commands. No airway compromise was observed with other textures. Recommend Dys 2 diet and nectar thick liquids. Will continue to follow to assess for possible upgrade as he continues to improve during acute admission. Impact on safety and function Mild aspiration risk;Moderate aspiration risk   CHL IP TREATMENT RECOMMENDATION 09/10/2016 Treatment Recommendations Therapy as outlined in  treatment plan below   Prognosis 09/10/2016 Prognosis for Safe Diet Advancement Good Barriers to Reach Goals -- Barriers/Prognosis Comment -- CHL IP DIET RECOMMENDATION 09/10/2016 SLP Diet Recommendations Dysphagia 2 (Fine chop) solids;Nectar thick liquid Liquid Administration via Cup;Straw Medication Administration Whole meds with puree Compensations Slow rate;Small sips/bites Postural Changes Remain semi-upright after after feeds/meals (Comment);Seated upright at 90 degrees   CHL IP OTHER RECOMMENDATIONS 09/10/2016 Recommended Consults -- Oral Care Recommendations Oral care BID Other Recommendations Order thickener from pharmacy;Prohibited food (jello, ice cream, thin soups);Remove water pitcher   CHL IP FOLLOW UP RECOMMENDATIONS 09/10/2016 Follow up Recommendations Skilled Nursing facility   [facility] IP FREQUENCY AND DURATION 09/10/2016 Speech Therapy Frequency (ACUTE ONLY) min 2x/week Treatment Duration 2 weeks      CHL IP ORAL PHASE 09/10/2016 Oral Phase Impaired Oral - Pudding Teaspoon -- Oral - Pudding Cup -- Oral - Honey Teaspoon -- Oral - Honey Cup -- Oral - Nectar Teaspoon -- Oral - Nectar Cup WFL Oral - Nectar Straw WFL Oral - Thin Teaspoon -- Oral - Thin Cup WFL Oral - Thin Straw -- Oral - Puree WFL Oral - Mech Soft Impaired mastication;Lingual/palatal residue Oral - Regular -- Oral - Multi-Consistency -- Oral - Pill -- Oral Phase - Comment --  CHL IP PHARYNGEAL PHASE 09/10/2016 Pharyngeal Phase Impaired Pharyngeal- Pudding Teaspoon -- Pharyngeal -- Pharyngeal- Pudding Cup -- Pharyngeal -- Pharyngeal- Honey Teaspoon -- Pharyngeal -- Pharyngeal- Honey Cup -- Pharyngeal -- Pharyngeal- Nectar Teaspoon -- Pharyngeal -- Pharyngeal- Nectar Cup WFL Pharyngeal -- Pharyngeal- Nectar Straw WFL Pharyngeal -- Pharyngeal- Thin Teaspoon -- Pharyngeal -- Pharyngeal- Thin Cup Reduced airway/laryngeal closure;Penetration/Aspiration during swallow;Compensatory strategies attempted (with notebox) Pharyngeal Material enters airway,  remains ABOVE vocal cords and not ejected out Pharyngeal- Thin Straw -- Pharyngeal -- Pharyngeal- Puree WFL Pharyngeal -- Pharyngeal- Mechanical Soft WFL Pharyngeal -- Pharyngeal- Regular -- Pharyngeal -- Pharyngeal- Multi-consistency -- Pharyngeal -- Pharyngeal- Pill -- Pharyngeal -- Pharyngeal Comment --  CHL IP CERVICAL ESOPHAGEAL PHASE 09/10/2016 Cervical Esophageal Phase WFL Pudding Teaspoon -- Pudding Cup -- Honey Teaspoon -- Honey Cup -- Nectar Teaspoon -- Nectar Cup -- Nectar Straw -- Thin Teaspoon --  Thin Cup -- Thin Straw -- Puree -- Mechanical Soft -- Regular -- Multi-consistency -- Pill -- Cervical Esophageal Comment -- No flowsheet data found. Germain Osgood 09/10/2016, 4:27 PM Germain Osgood, M.A. CCC-SLP [phone]                LOS: 1 day   Oren Binet, MD  [facility] Pager:[phone]  If 7PM-7AM, please contact night-coverage [email or web address] Password [email or web address] 10/10/2016, 11:22 AM

## 2016-10-10 NOTE — Progress Notes (Addendum)
Pharmacy Antibiotic Note  Andrew [name] is a 71 y.o. male admitted on 10/08/2016 with MRSA bacteremia and wound infection, c/o worsening appearance of wounds despite long ABX therapy.  Pharmacy has been consulted to continue vancomycin dosing that pt has been on as outpatient, started in hospital 1/15 w/ plan to continue until 2/26.  The cultures are growing pseudomonas and klebsiella, and their is concern for osteomyelitis. Pharmacy is increasing the dose of cefepime at this time.  Plan: Vancomycin 1250mg  IV q12 hours Cefepime 2000mg  IV q8 hours F/u culture and sensitivity  Height: 5\' 8"  (172.7 cm) Weight: 147 lb (66.7 kg) IBW/kg (Calculated) : 68.4  Temp (24hrs), Avg:98.2 F (36.8 C), Min:97.6 F (36.4 C), Max:98.5 F (36.9 C)   Recent Labs Lab 10/09/16 0049 10/09/16 0831 10/10/16 0420  WBC 14.1*  --  11.1*  CREATININE 0.53*  --  0.55*  VANCORANDOM  --  21  --     Estimated Creatinine Clearance: 79.9 mL/min (by C-G formula based on SCr of 0.55 mg/dL (L)).    Allergies  Allergen Reactions  . Penicillins Rash and Other (See Comments)    Tolerated Zosyn Jan 2018 Possibly rash? Has patient had a PCN reaction causing immediate rash, facial/tongue/throat swelling, SOB or lightheadedness with hypotension: YES Has patient had a PCN reaction causing severe rash involving mucus membranes or skin necrosis:NO Has patient had a PCN reaction that required hospitalization NO Has patient had a PCN reaction occurring within the last 10 years:NO If all of the above answers are "NO", then may proceed with Cephalosporin use.   Thank you for allowing pharmacy to be a part of this patient's care.  Georga Bora, PharmD Clinical Pharmacist Pager: [phone] 10/10/2016 2:43 PM

## 2016-10-10 NOTE — Evaluation (Signed)
Clinical/Bedside Swallow Evaluation Patient Details  Name: Andrew [name] MRN: [id number] Date of Birth: 1946-02-05  Today's Date: 10/10/2016 Time: SLP Start Time (ACUTE ONLY): 1018 SLP Stop Time (ACUTE ONLY): 1025 SLP Time Calculation (min) (ACUTE ONLY): 7 min  Past Medical History:  Past Medical History:  Diagnosis Date  . Blind right eye    S/P shotgun  . Chronic bronchitis ([place])   . Hyperlipemia   . Hypertension   . 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 Type II diabetes mellitus ([place])    Past Surgical History:  Past Surgical History:  Procedure Laterality Date  . INCISION AND DRAINAGE OF WOUND Right 10/09/2016   arm   HPI:  Patient is a 71 y.o.malewith hypertension, hyperlipidemia, diabetes mellitus, anxiety, AAA 4.8 cm on MRI (04/09/15), right eye blindness, anddCHF and recent prolonged hospitalization for MRSA bacteremia felt to be secondary to olecranon bursitis sent home on IV vancomycin, referred back to the hospital on 2/22 by home health RN for worsening right elbow/right arm wounds. He was subsequently admitted for further evaluation and treatment. During prior admission, pt seen by ST. MBS showed mild oropharnyngeal dysphagia with impaired mastication and oral clearance with solids, incomplete airway closure with deep silent penetration of thin liquids. Pt refused to follow commands to reduce impulsivity with intake, cough to clear penetrates or utilize postural manuevers. Pt was d/c on dys 2 with nectar thick liquids, with recommendation for follow-up to advance diet as tolerated.    Assessment / Plan / Recommendation Clinical Impression  Patient presents with oropharyngeal swallow which appears within functional limits with adequate airway protection at bedside. No overt signs of aspiration noted despite challenging with multiple consecutive swallows of thin liquids via cup in excess of 3 oz. Patient with missing dentition, however  mastication is noted to be grossly functional, with mild lingual residue of solids which clears with liquid wash. Recommend regular diet with thin liquids, meds whole with liquid no further SLP follow-up recommended at this time. SLP will s/o. SLP Visit Diagnosis: Dysphagia, oropharyngeal phase (R13.12)    Aspiration Risk  No limitations    Diet Recommendation Regular;Thin liquid   Liquid Administration via: Cup Medication Administration: Whole meds with liquid Supervision: Patient able to self feed Compensations: Slow rate;Small sips/bites Postural Changes: Seated upright at 90 degrees    Other  Recommendations Oral Care Recommendations: Oral care BID   Follow up Recommendations None      Frequency and Duration            Prognosis Prognosis for Safe Diet Advancement: Good      Swallow Study   General Date of Onset: 10/08/16 HPI: Patient is a 71 y.o.malewith hypertension, hyperlipidemia, diabetes mellitus, anxiety, AAA 4.8 cm on MRI (04/09/15), right eye blindness, anddCHF and recent prolonged hospitalization for MRSA bacteremia felt to be secondary to olecranon bursitis sent home on IV vancomycin, referred back to the hospital on 2/22 by home health RN for worsening right elbow/right arm wounds. He was subsequently admitted for further evaluation and treatment. During prior admission, pt seen by ST. MBS showed mild oropharnyngeal dysphagia with impaired mastication and oral clearance with solids, incomplete airway closure with deep silent penetration of thin liquids. Pt refused to follow commands to reduce impulsivity with intake, cough to clear penetrates or utilize postural manuevers. Pt was d/c on dys 2 with nectar thick liquids, with recommendation for follow-up to advance diet as tolerated.  Type of Study: Bedside Swallow Evaluation Previous  Swallow Assessment: see HPI Diet Prior to this Study: Dysphagia 2 (chopped);Thin liquids Temperature Spikes Noted: No Respiratory  Status: Room air History of Recent Intubation: No Behavior/Cognition: Alert;Cooperative Oral Cavity Assessment: Within Functional Limits Oral Care Completed by SLP: No Oral Cavity - Dentition: Missing dentition;Poor condition Vision: Functional for self-feeding Self-Feeding Abilities: Able to feed self Patient Positioning: Upright in bed Baseline Vocal Quality: Normal Volitional Cough: Strong Volitional Swallow: Able to elicit    Oral/Motor/Sensory Function Overall Oral Motor/Sensory Function: Within functional limits   Ice Chips Ice chips: Within functional limits   Thin Liquid Thin Liquid: Within functional limits Presentation: Self Fed;Cup    Nectar Thick Nectar Thick Liquid: Not tested   Honey Thick Honey Thick Liquid: Not tested   Puree Puree: Not tested   Solid   GO   Solid: Within functional limits Presentation: Self Fed        Aliene Altes 10/10/2016,10:32 AM  Ludwig Clarks: SatNancy Fetter: Mon: Tues: Wed: Thurs: FriDeneise Lever, MS CF-SLP Speech-Language Pathologist [phone]

## 2016-10-10 NOTE — Progress Notes (Signed)
  Echocardiogram 2D Echocardiogram has been performed.  Bobbye Charleston 10/10/2016, 2:08 PM

## 2016-10-11 ENCOUNTER — Encounter (HOSPITAL_COMMUNITY)
Admission: EM | Disposition: A | Discharge status: Home / Self Care | Payer: Self-pay | Source: Home / Self Care | Attending: Internal Medicine

## 2016-10-11 ENCOUNTER — Inpatient Hospital Stay (HOSPITAL_COMMUNITY): Payer: Medicare Other | Admitting: Anesthesiology

## 2016-10-11 ENCOUNTER — Encounter (HOSPITAL_COMMUNITY): Payer: Self-pay | Admitting: Orthopaedic Surgery

## 2016-10-11 DIAGNOSIS — Z95828 Presence of other vascular implants and grafts: Secondary | ICD-10-CM

## 2016-10-11 DIAGNOSIS — D649 Anemia, unspecified: Secondary | ICD-10-CM

## 2016-10-11 DIAGNOSIS — Z880 Allergy status to penicillin: Secondary | ICD-10-CM

## 2016-10-11 DIAGNOSIS — Z833 Family history of diabetes mellitus: Secondary | ICD-10-CM

## 2016-10-11 DIAGNOSIS — Z9889 Other specified postprocedural states: Secondary | ICD-10-CM

## 2016-10-11 DIAGNOSIS — M726 Necrotizing fasciitis: Secondary | ICD-10-CM

## 2016-10-11 DIAGNOSIS — L89013 Pressure ulcer of right elbow, stage 3: Secondary | ICD-10-CM

## 2016-10-11 DIAGNOSIS — F17210 Nicotine dependence, cigarettes, uncomplicated: Secondary | ICD-10-CM

## 2016-10-11 HISTORY — PX: I & D EXTREMITY: SHX5045

## 2016-10-11 LAB — CBC
HCT: 23.7 % — ABNORMAL LOW (ref 39.0–52.0)
Hemoglobin: 7.7 g/dL — ABNORMAL LOW (ref 13.0–17.0)
MCH: 28.5 pg (ref 26.0–34.0)
MCHC: 32.5 g/dL (ref 30.0–36.0)
MCV: 87.8 fL (ref 78.0–100.0)
Platelets: 397 10*3/uL (ref 150–400)
RBC: 2.7 MIL/uL — ABNORMAL LOW (ref 4.22–5.81)
RDW: 15.5 % (ref 11.5–15.5)
WBC: 11.7 10*3/uL — ABNORMAL HIGH (ref 4.0–10.5)

## 2016-10-11 LAB — BASIC METABOLIC PANEL
Anion gap: 6 (ref 5–15)
BUN: 11 mg/dL (ref 6–20)
CALCIUM: 8.2 mg/dL — AB (ref 8.9–10.3)
CO2: 27 mmol/L (ref 22–32)
Chloride: 103 mmol/L (ref 101–111)
Creatinine, Ser: 0.55 mg/dL — ABNORMAL LOW (ref 0.61–1.24)
GFR calc Af Amer: >60 mL/min (ref >60)
GLUCOSE: 231 mg/dL — AB (ref 65–99)
Potassium: 3.9 mmol/L (ref 3.5–5.1)
Sodium: 136 mmol/L (ref 135–145)

## 2016-10-11 LAB — GLUCOSE, CAPILLARY
GLUCOSE-CAPILLARY: 172 mg/dL — AB (ref 65–99)
GLUCOSE-CAPILLARY: 232 mg/dL — AB (ref 65–99)
GLUCOSE-CAPILLARY: 227 mg/dL — AB (ref 65–99)
Glucose-Capillary: 207 mg/dL — ABNORMAL HIGH (ref 65–99)
Glucose-Capillary: 267 mg/dL — ABNORMAL HIGH (ref 65–99)

## 2016-10-11 LAB — PREPARE RBC (CROSSMATCH)

## 2016-10-11 LAB — ABO/RH: ABO/RH(D): A POS

## 2016-10-11 SURGERY — IRRIGATION AND DEBRIDEMENT EXTREMITY
Anesthesia: General | Site: Arm Upper

## 2016-10-11 MED ORDER — LIDOCAINE HCL (CARDIAC) 20 MG/ML IV SOLN
INTRAVENOUS | Status: DC | PRN
  Administered 2016-10-11: 100 mg via INTRAVENOUS

## 2016-10-11 MED ORDER — PROPOFOL 10 MG/ML IV BOLUS
INTRAVENOUS | Status: DC | PRN
  Administered 2016-10-11: 100 mg via INTRAVENOUS

## 2016-10-11 MED ORDER — MEPERIDINE HCL 25 MG/ML IJ SOLN: 6.2500 mg | INTRAMUSCULAR | Status: DC | PRN

## 2016-10-11 MED ORDER — PHENYLEPHRINE HCL 10 MG/ML IJ SOLN
INTRAMUSCULAR | Status: DC | PRN
  Administered 2016-10-11: 40 ug via INTRAVENOUS
  Administered 2016-10-11: 80 ug via INTRAVENOUS

## 2016-10-11 MED ORDER — SODIUM CHLORIDE 0.9 % IR SOLN
Status: DC | PRN
  Administered 2016-10-11 (×2): 3000 mL

## 2016-10-11 MED ORDER — PROPOFOL 10 MG/ML IV BOLUS
INTRAVENOUS | Status: AC
  Filled 2016-10-11: qty 20

## 2016-10-11 MED ORDER — EPHEDRINE SULFATE 50 MG/ML IJ SOLN
INTRAMUSCULAR | Status: DC | PRN
  Administered 2016-10-11: 5 mg via INTRAVENOUS
  Administered 2016-10-11: 10 mg via INTRAVENOUS
  Administered 2016-10-11: 5 mg via INTRAVENOUS

## 2016-10-11 MED ORDER — FENTANYL CITRATE (PF) 100 MCG/2ML IJ SOLN
INTRAMUSCULAR | Status: AC
  Filled 2016-10-11: qty 4

## 2016-10-11 MED ORDER — DEXTROSE 5 % IV SOLN
2.0000 g | Freq: Three times a day (TID) | INTRAVENOUS | Status: DC
  Administered 2016-10-11 – 2016-10-13 (×5): 2 g via INTRAVENOUS
  Filled 2016-10-11 (×6): qty 2

## 2016-10-11 MED ORDER — ENOXAPARIN SODIUM 40 MG/0.4ML ~~LOC~~ SOLN
40.0000 mg | SUBCUTANEOUS | Status: DC
  Administered 2016-10-12 – 2016-10-15 (×4): 40 mg via SUBCUTANEOUS
  Filled 2016-10-11 (×4): qty 0.4

## 2016-10-11 MED ORDER — ONDANSETRON HCL 4 MG/2ML IJ SOLN
INTRAMUSCULAR | Status: DC | PRN
  Administered 2016-10-11: 4 mg via INTRAVENOUS

## 2016-10-11 MED ORDER — INSULIN GLARGINE 100 UNIT/ML ~~LOC~~ SOLN
10.0000 [IU] | Freq: Every day | SUBCUTANEOUS | Status: DC
  Administered 2016-10-11 – 2016-10-13 (×3): 10 [IU] via SUBCUTANEOUS
  Filled 2016-10-11 (×3): qty 0.1

## 2016-10-11 MED ORDER — SODIUM CHLORIDE 0.9 % IV SOLN
INTRAVENOUS | Status: DC | PRN
  Administered 2016-10-11: 08:00:00 via INTRAVENOUS

## 2016-10-11 MED ORDER — ONDANSETRON HCL 4 MG/2ML IJ SOLN
4.0000 mg | Freq: Once | INTRAMUSCULAR | Status: DC | PRN
Start: 2016-10-11 — End: 2016-10-11

## 2016-10-11 MED ORDER — VANCOMYCIN HCL 10 G IV SOLR
1250.0000 mg | Freq: Two times a day (BID) | INTRAVENOUS | Status: DC
  Administered 2016-10-11 – 2016-10-15 (×8): 1250 mg via INTRAVENOUS
  Filled 2016-10-11 (×8): qty 1250

## 2016-10-11 MED ORDER — HYDROMORPHONE HCL 1 MG/ML IJ SOLN: 0.2500 mg | INTRAMUSCULAR | Status: DC | PRN

## 2016-10-11 MED ORDER — SODIUM CHLORIDE 0.9 % IV SOLN
Freq: Once | INTRAVENOUS | Status: AC
  Administered 2016-10-11: 12:00:00 via INTRAVENOUS

## 2016-10-11 MED ORDER — FENTANYL CITRATE (PF) 100 MCG/2ML IJ SOLN
INTRAMUSCULAR | Status: DC | PRN
  Administered 2016-10-11: 25 ug via INTRAVENOUS
  Administered 2016-10-11: 100 ug via INTRAVENOUS

## 2016-10-11 MED ORDER — MIDAZOLAM HCL 2 MG/2ML IJ SOLN
INTRAMUSCULAR | Status: AC
  Filled 2016-10-11: qty 2

## 2016-10-11 SURGICAL SUPPLY — 56 items
BANDAGE ACE 4X5 VEL STRL LF (GAUZE/BANDAGES/DRESSINGS) ×4 IMPLANT
BANDAGE ELASTIC 3 VELCRO ST LF (GAUZE/BANDAGES/DRESSINGS) IMPLANT
BNDG COHESIVE 1X5 TAN STRL LF (GAUZE/BANDAGES/DRESSINGS) IMPLANT
BNDG COHESIVE 4X5 TAN STRL (GAUZE/BANDAGES/DRESSINGS) ×3 IMPLANT
BNDG COHESIVE 6X5 TAN STRL LF (GAUZE/BANDAGES/DRESSINGS) ×2 IMPLANT
BNDG CONFORM 3 STRL LF (GAUZE/BANDAGES/DRESSINGS) IMPLANT
BNDG GAUZE STRTCH 6 (GAUZE/BANDAGES/DRESSINGS) ×3 IMPLANT
CANISTER WOUND CARE 500ML ATS (WOUND CARE) ×2 IMPLANT
CORDS BIPOLAR (ELECTRODE) IMPLANT
COVER SURGICAL LIGHT HANDLE (MISCELLANEOUS) ×3 IMPLANT
CUFF TOURNIQUET SINGLE 24IN (TOURNIQUET CUFF) IMPLANT
CUFF TOURNIQUET SINGLE 34IN LL (TOURNIQUET CUFF) IMPLANT
CUFF TOURNIQUET SINGLE 44IN (TOURNIQUET CUFF) IMPLANT
DRAPE INCISE IOBAN 66X45 STRL (DRAPES) ×2 IMPLANT
DRAPE STERI IOBAN 125X83 (DRAPES) ×2 IMPLANT
DRAPE U-SHAPE 47X51 STRL (DRAPES) ×5 IMPLANT
DRSG VAC ATS LRG SENSATRAC (GAUZE/BANDAGES/DRESSINGS) ×2 IMPLANT
DURAPREP 26ML APPLICATOR (WOUND CARE) ×1 IMPLANT
ELECT REM PT RETURN 9FT ADLT (ELECTROSURGICAL)
ELECTRODE REM PT RTRN 9FT ADLT (ELECTROSURGICAL) IMPLANT
FACESHIELD WRAPAROUND (MASK) ×3 IMPLANT
FACESHIELD WRAPAROUND OR TEAM (MASK) IMPLANT
GAUZE SPONGE 4X4 12PLY STRL (GAUZE/BANDAGES/DRESSINGS) ×2 IMPLANT
GAUZE XEROFORM 5X9 LF (GAUZE/BANDAGES/DRESSINGS) ×3 IMPLANT
GLOVE SKINSENSE NS SZ7.5 (GLOVE) ×4
GLOVE SKINSENSE STRL SZ7.5 (GLOVE) ×2 IMPLANT
GOWN STRL REIN XL XLG (GOWN DISPOSABLE) ×6 IMPLANT
HANDPIECE INTERPULSE COAX TIP (DISPOSABLE)
KIT BASIN OR (CUSTOM PROCEDURE TRAY) ×3 IMPLANT
KIT ROOM TURNOVER OR (KITS) ×3 IMPLANT
MANIFOLD NEPTUNE II (INSTRUMENTS) ×3 IMPLANT
PACK ORTHO EXTREMITY (CUSTOM PROCEDURE TRAY) ×3 IMPLANT
PAD ABD 8X10 STRL (GAUZE/BANDAGES/DRESSINGS) ×3 IMPLANT
PAD ARMBOARD 7.5X6 YLW CONV (MISCELLANEOUS) ×6 IMPLANT
PADDING CAST ABS 3INX4YD NS (CAST SUPPLIES) ×6
PADDING CAST ABS 4INX4YD NS (CAST SUPPLIES) ×2
PADDING CAST ABS COTTON 3X4 (CAST SUPPLIES) IMPLANT
PADDING CAST ABS COTTON 4X4 ST (CAST SUPPLIES) ×1 IMPLANT
PADDING CAST COTTON 6X4 STRL (CAST SUPPLIES) ×1 IMPLANT
SET HNDPC FAN SPRY TIP SCT (DISPOSABLE) IMPLANT
SPLINT FIBERGLASS 4X30 (CAST SUPPLIES) ×2 IMPLANT
SPONGE LAP 18X18 X RAY DECT (DISPOSABLE) ×3 IMPLANT
STOCKINETTE IMPERVIOUS 9X36 MD (GAUZE/BANDAGES/DRESSINGS) ×3 IMPLANT
SUT ETHILON 2 0 FS 18 (SUTURE) ×3 IMPLANT
SUT ETHILON 2 0 PSLX (SUTURE) ×1 IMPLANT
SUT VIC AB 2-0 FS1 27 (SUTURE) ×2 IMPLANT
TOWEL OR 17X24 6PK STRL BLUE (TOWEL DISPOSABLE) ×3 IMPLANT
TOWEL OR 17X26 10 PK STRL BLUE (TOWEL DISPOSABLE) ×3 IMPLANT
TUBE ANAEROBIC SPECIMEN COL (MISCELLANEOUS) IMPLANT
TUBE CONNECTING 12'X1/4 (SUCTIONS) ×1
TUBE CONNECTING 12X1/4 (SUCTIONS) ×2 IMPLANT
TUBE FEEDING 5FR 15 INCH (TUBING) IMPLANT
TUBING CYSTO DISP (UROLOGICAL SUPPLIES) ×3 IMPLANT
UNDERPAD 30X30 (UNDERPADS AND DIAPERS) ×6 IMPLANT
WATER STERILE IRR 1000ML POUR (IV SOLUTION) ×3 IMPLANT
YANKAUER SUCT BULB TIP NO VENT (SUCTIONS) ×3 IMPLANT

## 2016-10-11 NOTE — Progress Notes (Signed)
Orders received this morning to transfuse 1 unit of blood. Patient has surgery with anesthesia and can't sign consent for 24 hours.  Dr. Sloan Leiter made aware who stated to hold the blood and they would repeat the labs in the morning.  Will continue to monitor.

## 2016-10-11 NOTE — Evaluation (Signed)
Physical Therapy Evaluation Patient Details Name: Andrew [name] MRN: [id number] DOB: [birth year] Today's Date: 10/11/2016   History of Present Illness  Patient is a 71 y.o. male with hypertension, hyperlipidemia, diabetes mellitus, anxiety, AAA 4.8 cm on MRI (04/09/15), right eye blindness, and dCHF and recent prolonged hospitalization for MRSA bacteremia felt to be secondary to olecranon bursitis sent home on IV vancomycin, referred back to the hospital on 2/22 by home health RN for worsening right elbow/right arm wounds. now s/p I&D R elbow including bone excisement  Clinical Impression   Patient is s/p above surgery resulting in functional limitations due to the deficits listed below (see PT Problem List). Presents with functional dependencies due to weakness, decr balance, as well as decr ability to use RUE; has family help at home, and they have been managing as well as they can since this last recent admission; Andrew [name] benefit from more post-acute rehab to work towards his goal of walking well again and maximize independence and safety with mobility prior to dc back home; He will need some convincing re: rehab;  Patient will benefit from skilled PT to increase their independence and safety with mobility to allow discharge to the venue listed below.       Follow Up Recommendations Other (comment) (Post-acute rehab)    Equipment Recommendations  Other (comment) (to be determined; needs unilateral device)    Recommendations for Other Services OT consult     Precautions / Restrictions Precautions Precautions: Fall Restrictions Weight Bearing Restrictions: Yes RUE Weight Bearing: Non weight bearing Other Position/Activity Restrictions: Dizziness with standing on PT eval      Mobility  Bed Mobility Overal bed mobility: Modified Independent             General bed mobility comments: slow moving, but gets to EOB without assist  Transfers Overall transfer level:  Needs assistance Equipment used: 1 person hand held assist Transfers: Sit to/from Stand Sit to Stand: Mod assist         General transfer comment: mod assist to power up and steady; reaches out for LUE supoprt in standing  Ambulation/Gait Ambulation/Gait assistance: Mod assist Ambulation Distance (Feet):  (2 small sidesteps to [facility]) Assistive device: 1 person hand held assist       General Gait Details: Mod assist to steady  Financial trader Rankin (Stroke Patients Only)       Balance Overall balance assessment: Needs assistance   Sitting balance-Leahy Scale: Good     Standing balance support: Single extremity supported Standing balance-Leahy Scale: Poor                               Pertinent Vitals/Pain Pain Assessment: Faces Faces Pain Scale: Hurts even more Pain Location: R arm Pain Descriptors / Indicators: Discomfort;Grimacing Pain Intervention(s): Monitored during session    Home Living Family/patient expects to be discharged to:: Private residence Living Arrangements: Children Available Help at Discharge: Family (pt reports his son does not work) Type of Home: Mobile home Home Access: Stairs to enter Entrance Stairs-Rails: Right;Left;Can reach both Technical brewer of Steps: 3 Home Layout: One level Home Equipment: None      Prior Function Level of Independence: Needs assistance   Gait / Transfers Assistance Needed: needs help from his son to walk; reports multiple falls  ADL's / Homemaking Assistance Needed: performs self  care independently, son does cooking and housekeeping  Comments: Questionable historian.     Hand Dominance   Dominant Hand: Right    Extremity/Trunk Assessment   Upper Extremity Assessment Upper Extremity Assessment: Defer to OT evaluation (RUE in ACE wrap postop)    Lower Extremity Assessment Lower Extremity Assessment: Generalized weakness        Communication   Communication: No difficulties  Cognition Arousal/Alertness: Awake/alert Behavior During Therapy: WFL for tasks assessed/performed ([place]) Overall Cognitive Status: Impaired/Different from baseline Area of Impairment: Safety/judgement         Safety/Judgement: Decreased awareness of safety;Decreased awareness of deficits     General Comments: States he wants to go home, but yet also states he falls and his sonn has to help him walk, and when his son helped him walk, his so held him too tightly and broke his ribs    General Comments      Exercises     Assessment/Plan    PT Assessment Patient needs continued PT services  PT Problem List Decreased strength;Decreased range of motion;Decreased activity tolerance;Decreased balance;Decreased mobility;Decreased coordination;Decreased cognition;Decreased knowledge of use of DME;Decreased safety awareness;Decreased knowledge of precautions;Pain       PT Treatment Interventions DME instruction;Gait training;Stair training;Functional mobility training;Therapeutic activities;Therapeutic exercise;Balance training;Patient/family education    PT Goals (Current goals can be found in the Care Plan section)  Acute Rehab PT Goals Patient Stated Goal: to walk PT Goal Formulation: With patient Time For Goal Achievement: 10/25/16 Potential to Achieve Goals: Good    Frequency Min 3X/week   Barriers to discharge        Co-evaluation               End of Session Equipment Utilized During Treatment: Gait belt Activity Tolerance: Patient tolerated treatment well Patient left: in bed;with call bell/phone within reach;with bed alarm set Nurse Communication: Mobility status PT Visit Diagnosis: Unsteadiness on feet (R26.81);Pain;Muscle weakness (generalized) (M62.81);History of falling (Z91.81) Pain - Right/Left: Right Pain - part of body: Arm         Time: [id number] PT Time Calculation (min) (ACUTE ONLY): 23  min   Charges:   PT Evaluation $PT Eval Moderate Complexity: 1 Procedure PT Treatments $Therapeutic Activity: 8-22 mins   PT G Codes:         Andrew [name] 10/11/2016, 6:45 PM  Andrew [name], [place] Pager [phone] Office [phone]

## 2016-10-11 NOTE — Op Note (Signed)
   Date of Surgery: 10/11/2016  INDICATIONS: Mr. Wavra is a 71 y.o.-year-old male turns today for serial debridement of his right upper arm wound;  The patient did consent to the procedure after discussion of the risks and benefits.  PREOPERATIVE DIAGNOSIS: Right elbow and arm nonhealing and necrotic pressure ulcers status post debridement  POSTOPERATIVE DIAGNOSIS: Same.  PROCEDURE:  1. Sharp excisional debridement of skin, muscle, fascia, bone right upper extremity 25 x 20 x 2 cm 2. Application of wound VAC greater than 50 cm  SURGEON: N. Eduard Roux, M.D.  ASSIST: None.  ANESTHESIA:  general  IV FLUIDS AND URINE: See anesthesia.  ESTIMATED BLOOD LOSS: 500 mL.  IMPLANTS: None  DRAINS: Wound VAC  COMPLICATIONS: None.  DESCRIPTION OF PROCEDURE: The patient was brought to the operating room and placed supine on the operating table.  The patient had been signed prior to the procedure and this was documented. The patient had the anesthesia placed by the anesthesiologist.  A time-out was performed to confirm that this was the correct patient, site, side and location. The patient did receive antibiotics prior to the incision and was re-dosed during the procedure as needed at indicated intervals.  He was then placed in the lateral position with all bony prominences well-padded. The patient had the operative extremity prepped and draped in the standard surgical fashion.    I inspected the wound which for the most part looked healthy and viable. There was some necrotic tissue on the ulnar edge of the wound. Sharp excisional debridement of the skin, muscle, fascia was performed with a knife. I then gently performed sharp excisional debridement of the rest of the wound including bone with a knife and [place].  The remaining tissue bed exhibited good punctate bleeding. The wound was then thoroughly irrigated with normal saline. A large wound VAC sponge was then placed in the wound in order to  cover the soft tissue defect. The wound VAC was then turned to -125 mmHg.  He was placed in a sling. The patient tolerated the procedure well and had no immediate complications.  POSTOPERATIVE PLAN: At this point I feel that his wound is stable. I will consult plastic surgery for their assistance on wound coverage.  Azucena Cecil, MD [place] 3:09 PM

## 2016-10-11 NOTE — Consult Note (Signed)
[facility] for [facility]       Reason for Consult: osteomyelitis    Referring Physician: Dr. Sloan Leiter  Principal Problem:   Septic olecranon bursitis of right elbow Active Problems:   Hypertension   Bacteremia due to methicillin resistant Staphylococcus aureus   Pressure injury of skin   . amLODipine  10 mg Oral Daily  . aspirin  325 mg Oral Daily  . atorvastatin  20 mg Oral q1800  . ceFEPime (MAXIPIME) IV  2 g Intravenous Q8H  . Chlorhexidine Gluconate Cloth  6 each Topical Q0600  . [START ON 10/12/2016] enoxaparin (LOVENOX) injection  40 mg Subcutaneous Q24H  . feeding supplement (ENSURE ENLIVE)  237 mL Oral Q24H  . gabapentin  100 mg Oral TID  . insulin aspart  0-9 Units Subcutaneous Q4H  . insulin glargine  10 Units Subcutaneous Daily  . metoprolol succinate  100 mg Oral Daily  . nicotine  21 mg Transdermal Daily  . nystatin   Topical TID  . sodium chloride flush  3 mL Intravenous Q12H  . thiamine  100 mg Oral Daily  . vancomycin  1,250 mg Intravenous Q12H    Recommendations: Continue cefepime for now  If Klebsiella FQ sensitive, he can go home with oral high dose cipro for 6 weeks total from last surgery 2/23. - would be difficult for him to do IV antibiotics at home with his non functional right arm Can stop vancomycin at discharge  Dr. Baxter Flattery to follow up tomorrow  Assessment: He has an extensive infection of his elbow with recent bursitis with MRSA then developed pressure ulcers requiring extensive debridement by Dr. Erlinda Hong including bone resection.   CRP < 0.8, ESR   Antibiotics: Vancomycin and cefepime  HPI: Andrew [name] is a 71 y.o. male with recent bursitis and MRSA bacteremia and treated for 6 weeks after refusing TEE who comes in with right elbow wound infection requiring debridement x 2.  Felt to be necrotic pressure ulcers.  Has picc line still.  No fever, no chills.  No other complaints. Says his son helps him sometimes.   CT  independently reviewed and no lesions concerning for osteomyelitis.   Review of Systems:  Constitutional: negative for fevers and chills Gastrointestinal: negative for diarrhea Integument/breast: negative for rash Musculoskeletal: negative for myalgias and arthralgias All other systems reviewed and are negative    Past Medical History:  Diagnosis Date  . Blind right eye    S/P shotgun  . Chronic bronchitis ([place])   . Hyperlipemia   . Hypertension   . 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 Type II diabetes mellitus ([place])     Social History  Substance Use Topics  . Smoking status: Current Every Day Smoker    Packs/day: 1.50    Years: 63.00    Types: Cigarettes  . Smokeless tobacco: Former Systems developer    Types: Chew  . Alcohol use Yes     Comment: 10/09/2016 "nothing in 2 years"    Family History  Problem Relation Age of Onset  . Diabetes Mellitus II Mother   . Diabetes Mellitus II Father   . Diabetes Mellitus II Sister   . Diabetes Mellitus II Sister     Allergies  Allergen Reactions  . Penicillins Rash and Other (See Comments)    Tolerated Zosyn Jan 2018 Possibly rash? Has patient had a PCN reaction causing immediate rash, facial/tongue/throat swelling, SOB or lightheadedness with hypotension: YES Has patient  had a PCN reaction causing severe rash involving mucus membranes or skin necrosis:NO Has patient had a PCN reaction that required hospitalization NO Has patient had a PCN reaction occurring within the last 10 years:NO If all of the above answers are "NO", then may proceed with Cephalosporin use.    Physical Exam: Constitutional: in no apparent distress and alert  Vitals:   10/11/16 0957 10/11/16 1350  BP: (!) 151/73 (!) 124/59  Pulse: 65 68  Resp: 13 13  Temp: 97.4 F (36.3 C) 97.9 F (36.6 C)   EYES: anicteric ENMT: no thrush Cardiovascular: Cor RRR Respiratory: CTA B; normal respiratory effort GI: Bowel sounds are  normal, liver is not enlarged, spleen is not enlarged Musculoskeletal: no pedal edema noted; right arm wrapped Skin: negatives: no rash Lines: left arm picc without erythema  Lab Results  Component Value Date   WBC 11.7 (H) 10/11/2016   HGB 7.7 (L) 10/11/2016   HCT 23.7 (L) 10/11/2016   MCV 87.8 10/11/2016   PLT 397 10/11/2016    Lab Results  Component Value Date   CREATININE 0.55 (L) 10/11/2016   BUN 11 10/11/2016   NA 136 10/11/2016   K 3.9 10/11/2016   CL 103 10/11/2016   CO2 27 10/11/2016    Lab Results  Component Value Date   ALT 33 04/18/2015   AST 28 04/18/2015   ALKPHOS 183 (H) 04/18/2015     Microbiology: Recent Results (from the past 240 hour(s))  Culture, blood (routine x 2)     Status: None (Preliminary result)   Collection Time: 10/09/16 12:55 AM  Result Value Ref Range Status   Specimen Description BLOOD RIGHT HAND  Final   Special Requests IN PEDIATRIC BOTTLE 3ML  Final   Culture NO GROWTH 1 DAY  Final   Report Status PENDING  Incomplete  Culture, blood (routine x 2)     Status: None (Preliminary result)   Collection Time: 10/09/16  6:08 AM  Result Value Ref Range Status   Specimen Description BLOOD RIGHT HAND  Final   Special Requests BOTTLES DRAWN AEROBIC AND ANAEROBIC  5CC  Final   Culture NO GROWTH 1 DAY  Final   Report Status PENDING  Incomplete  MRSA PCR Screening     Status: None   Collection Time: 10/09/16  9:48 AM  Result Value Ref Range Status   MRSA by PCR NEGATIVE NEGATIVE Final    Comment:        The GeneXpert MRSA Assay (FDA approved for NASAL specimens only), is one component of a comprehensive MRSA colonization surveillance program. It is not intended to diagnose MRSA infection nor to guide or monitor treatment for MRSA infections.   Aerobic/Anaerobic Culture (surgical/deep wound)     Status: None (Preliminary result)   Collection Time: 10/09/16  2:10 PM  Result Value Ref Range Status   Specimen Description TISSUE RIGHT ARM   Final   Special Requests PATIENT ON FOLLOWING VANC  Final   Gram Stain   Final    MODERATE WBC PRESENT, PREDOMINANTLY PMN NO ORGANISMS SEEN    Culture   Final    RARE PSEUDOMONAS AERUGINOSA RARE KLEBSIELLA OXYTOCA CRITICAL RESULT CALLED TO, READ BACK BY AND VERIFIED WITH: T PHILLIP,RN AT 1036 10/10/16 BY L BENFIELD CONCERNING GROWTH ON CULTURE SUSCEPTIBILITIES TO FOLLOW FOR KLEBSIELLA NO ANAEROBES ISOLATED; CULTURE IN PROGRESS FOR 5 DAYS    Report Status PENDING  Incomplete   Organism ID, Bacteria PSEUDOMONAS AERUGINOSA  Final  Susceptibility   Pseudomonas aeruginosa - MIC*    CEFTAZIDIME 2 SENSITIVE Sensitive     CIPROFLOXACIN <=0.25 SENSITIVE Sensitive     GENTAMICIN <=1 SENSITIVE Sensitive     IMIPENEM 2 SENSITIVE Sensitive     PIP/TAZO 8 SENSITIVE Sensitive     CEFEPIME <=1 SENSITIVE Sensitive     * RARE PSEUDOMONAS AERUGINOSA  Acid Fast Smear (AFB)     Status: None   Collection Time: 10/09/16  2:10 PM  Result Value Ref Range Status   AFB Specimen Processing Comment  Final    Comment: Tissue Grinding and Digestion/Decontamination   Acid Fast Smear Negative  Final    Comment: (NOTE) Performed At: [facility] [address] [place], [place] [id number] Lindon Romp MD [id number]    Source (AFB) TISSUE  Final    Comment: RIGHT ARM     Scharlene Gloss, [place] [facility] [employer] [email or web address] [phone] pager  [phone] cell 10/11/2016, 2:36 PM

## 2016-10-11 NOTE — Anesthesia Procedure Notes (Signed)
Procedure Name: LMA Insertion Date/Time: 10/11/2016 8:01 AM Performed by: Gaylene Brooks Pre-anesthesia Checklist: Patient identified, Emergency Drugs available, Suction available and Patient being monitored Patient Re-evaluated:Patient Re-evaluated prior to inductionOxygen Delivery Method: Circle System Utilized Preoxygenation: Pre-oxygenation with 100% oxygen Intubation Type: IV induction Ventilation: Mask ventilation without difficulty LMA: LMA inserted LMA Size: 5.0 Number of attempts: 1 Placement Confirmation: positive ETCO2 Tube secured with: Tape Dental Injury: Teeth and Oropharynx as per pre-operative assessment

## 2016-10-11 NOTE — Anesthesia Preprocedure Evaluation (Signed)
Anesthesia Evaluation  Patient identified by MRN, date of birth, ID band Patient awake    Reviewed: Allergy & Precautions, NPO status , Patient's Chart, lab work & pertinent test results  Airway Mallampati: I  TM Distance: >3 FB Neck ROM: Full    Dental   Pulmonary Current Smoker,    Pulmonary exam normal        Cardiovascular hypertension, Pt. on medications Normal cardiovascular exam     Neuro/Psych    GI/Hepatic   Endo/Other  diabetes, Type 2, Oral Hypoglycemic Agents  Renal/GU      Musculoskeletal   Abdominal   Peds  Hematology   Anesthesia Other Findings   Reproductive/Obstetrics                             Anesthesia Physical Anesthesia Plan  ASA: III  Anesthesia Plan: General   Post-op Pain Management:    Induction: Intravenous  Airway Management Planned: LMA  Additional Equipment:   Intra-op Plan:   Post-operative Plan: Extubation in OR  Informed Consent: I have reviewed the patients History and Physical, chart, labs and discussed the procedure including the risks, benefits and alternatives for the proposed anesthesia with the patient or authorized representative who has indicated his/her understanding and acceptance.     Plan Discussed with: CRNA and Surgeon  Anesthesia Plan Comments:         Anesthesia Quick Evaluation

## 2016-10-11 NOTE — Progress Notes (Signed)
Dr. Sloan Leiter came and assessed patient who stated he is alert and oriented and can receive blood transfusion.  Confirmed with Andrew [name] [facility] that patient can sign own consent if alert and oriented after procedure.

## 2016-10-11 NOTE — Anesthesia Postprocedure Evaluation (Signed)
Anesthesia Post Note  Patient: Andrew [name]  Procedure(s) Performed: Procedure(s) (LRB): IRRIGATION AND DEBRIDEMENT EXTREMITY RIGHT ARM WOUND (N/A)  Patient location during evaluation: PACU Anesthesia Type: General Level of consciousness: awake and alert Pain management: pain level controlled Vital Signs Assessment: post-procedure vital signs reviewed and stable Respiratory status: spontaneous breathing, nonlabored ventilation, respiratory function stable and patient connected to nasal cannula oxygen Cardiovascular status: blood pressure returned to baseline and stable Postop Assessment: no signs of nausea or vomiting Anesthetic complications: no       Last Vitals:  Vitals:   10/11/16 0945 10/11/16 0957  BP:  (!) 151/73  Pulse: 64 65  Resp: 12 13  Temp:  36.3 C    Last Pain:  Vitals:   10/11/16 0557  TempSrc: Oral  PainSc:                  Katonya Blecher DAVID

## 2016-10-11 NOTE — Progress Notes (Signed)
PROGRESS NOTE        PATIENT DETAILS Name: Andrew [name] Age: 71 y.o. Sex: male Date of Birth: 1946/04/20 Admit Date: 10/08/2016 Admitting Physician Etta Quill, DO XBW:IOMBTD,HRCB HENRY, MD  Brief Narrative: Patient is a 71 y.o. male with recent prolonged hospitalization for MRSA bacteremia felt to be secondary to olecranon bursitis sent home on IV vancomycin, referred back to the hospital on 2/22 by home health RN for worsening right elbow/right arm wounds. He was subsequently admitted for further evaluation and treatment.  Subjective: Lying comfortably in bed-he just came back from the operating room.  Assessment/Plan: Right arm/elbow necrotizing infection with? Osteomyelitis: Underwent excisional debridement on 2/23 and 2/25. Wound VAC in place, orthopedics planning on consulting plastic surgery. Per orthopedic op note on 2/25-bone excisional debridement done as well. Intraoperative cultures on 2/23 positive for Pseudomonas and Klebsiella. It is likely that patient not only has necrotizing soft tissue infection but also possible osteomyelitis. Continue empiric cefepime, on IV vancomycin prior to this admission. Have consulted infectious disease to guide antimicrobial regimen/duration.   Recent history of MRSA bacteremia: Has mild leukocytosis-but afebrile and does not appear toxic. Continue IV vancomycin, blood cultures on 2/23 negative so far. Per last discharge summary-stop date 10/12/16.  Have consulted infectious disease to guide antimicrobial regimen/duration.   Insulin-dependent type 2 diabetes: Did have mild hypoglycemia on 2/23-but CBGs much more stable. Continue Lantus but will increase to 10 units, continue SSI. Have counseled patient extensively regarding importance of optimal glycemic control in order for wound healing, infection control.We will continue to follow and optimize accordingly.  Anemia: Likely secondary to acute illness-no evidence of  overt blood loss. Plans are to follow CBC.  Chronic diastolic heart failure: Clinically compensated, follow clinically.  Hypertension: BP relatively well controlled-continue with metoprolol and amlodipine.  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  History of dysphagia: Per last discharge summary he was started on a dysphagia 2 diet-repeat speech therapy evaluation completed-recommendations are for regular diet.   Tobacco abuse: Continue transdermal nicotine. I do not think he has any inclination to quit at this time.  DVT Prophylaxis: Start Prophylactic Lovenox 2/26  Code Status: Full code   Family Communication: None at bedside  Disposition Plan: Remain inpatient-but will plan on resumption of Home health on discharge-but may require SNF on discharge  Antimicrobial agents: Anti-infectives    Start     Dose/Rate Route Frequency Ordered Stop   10/10/16 1400  ceFEPIme (MAXIPIME) 1 g in dextrose 5 % 50 mL IVPB     1 g 100 mL/hr over 30 Minutes Intravenous Every 8 hours 10/10/16 1140     10/09/16 1715  vancomycin IVPB  Status:  Discontinued    Comments:  Indication: MRSA bacteremia Last Day of Therapy:  10/12/16 Labs - Sunday/Monday:  CBC/D, BMP, and vancomycin trough. Labs - Thursday:  BMP and vancomycin trough Labs - Every other week:  ESR and CRP     75 0 mg Intravenous Every 12 hours 10/09/16 1701 10/09/16 1707   10/09/16 1200  vancomycin (VANCOCIN) 1,250 mg in sodium chloride 0.9 % 250 mL IVPB     1,250 mg 166.7 mL/hr over 90 Minutes Intravenous Every 12 hours 10/09/16 1002        Procedures: None  CONSULTS:  orthopedic surgery  Time spent: 25- minutes-Greater than 50% of this time was spent in counseling, explanation of diagnosis, planning of further management, and coordination of care.  MEDICATIONS: Scheduled Meds: . sodium chloride   Intravenous Once  . amLODipine  10  mg Oral Daily  . aspirin  325 mg Oral Daily  . atorvastatin  20 mg Oral q1800  . ceFEPime (MAXIPIME) IV  1 g Intravenous Q8H  . Chlorhexidine Gluconate Cloth  6 each Topical Q0600  . feeding supplement (ENSURE ENLIVE)  237 mL Oral Q24H  . gabapentin  100 mg Oral TID  . insulin aspart  0-9 Units Subcutaneous Q4H  . insulin glargine  7 Units Subcutaneous Daily  . metoprolol succinate  100 mg Oral Daily  . nicotine  21 mg Transdermal Daily  . nystatin   Topical TID  . sodium chloride flush  3 mL Intravenous Q12H  . thiamine  100 mg Oral Daily  . vancomycin  1,250 mg Intravenous Q12H   Continuous Infusions: . dextrose 5 % and 0.9% NaCl 40 mL/hr at 10/11/16 0453   PRN Meds:.sodium chloride flush   PHYSICAL EXAM: Vital signs: Vitals:   10/11/16 0927 10/11/16 0942 10/11/16 0945 10/11/16 0957  BP: (!) 159/74 (!) 155/72  (!) 151/73  Pulse: 63 64 64 65  Resp: 17 11 12 13   Temp:    97.4 F (36.3 C)  TempSrc:      SpO2: 98% 92% 93% 95%  Weight:      Height:       Filed Weights   10/09/16 0152  Weight: 66.7 kg (147 lb)   Body mass index is 22.35 kg/m.   General appearance :Awake, alert, not in any distress. Speech Clear.  Eyes:, pupils equally reactive to light and accomodation,no scleral icterus. HEENT: Atraumatic and Normocephalic Neck: supple, no JVD. No cervical lymphadenopathy.  Resp:Good air entry bilaterally CVS: S1 S2 regular GI: Bowel sounds present, Non tender and not distended with no gaurding, rigidity or rebound. Extremities: B/L Lower Ext shows no edema, both legs are warm to touch.Right arm dressing in place-did not open. Neurology:  speech clear,Non focal, sensation is grossly intact. Psychiatric: Normal judgment and insight. Alert and oriented x 3. Normal mood. Musculoskeletal:No digital cyanosis Skin:No Rash, warm and dry Wounds:N/A  I have personally reviewed following labs and imaging studies  LABORATORY DATA: CBC:  Recent Labs Lab 10/09/16 0049  10/10/16 0420  WBC 14.1* 11.1*  NEUTROABS 8.7*  --   HGB 11.0* 7.9*  HCT 34.1* 24.5*  MCV 88.3 88.4  PLT 415* 947    Basic Metabolic Panel:  Recent Labs Lab 10/09/16 0049 10/10/16 0420  NA 137 134*  K 4.1 4.0  CL 101 98*  CO2 26 28  GLUCOSE 166* 189*  BUN 9 12  CREATININE 0.53* 0.55*  CALCIUM 9.1 8.2*    GFR: Estimated Creatinine Clearance: 79.9 mL/min (by C-G formula based on SCr of 0.55 mg/dL (L)).  Liver Function Tests: No results for input(s): AST, ALT, ALKPHOS, BILITOT, PROT, ALBUMIN in the last 168 hours. No results for input(s): LIPASE, AMYLASE in the last 168 hours. No results for input(s): AMMONIA in the last 168 hours.  Coagulation Profile: No results for input(s): INR, PROTIME in the last 168 hours.  Cardiac Enzymes: No results for input(s): CKTOTAL, CKMB, CKMBINDEX, TROPONINI in the last 168 hours.  BNP (last 3 results) No results for input(s): PROBNP in the last 8760 hours.  HbA1C: No results for input(s): HGBA1C in the  last 72 hours.  CBG:  Recent Labs Lab 10/10/16 1114 10/10/16 1622 10/10/16 2110 10/11/16 0601 10/11/16 0921  GLUCAP 288* 307* 154* 172* 232*    Lipid Profile: No results for input(s): CHOL, HDL, LDLCALC, TRIG, CHOLHDL, LDLDIRECT in the last 72 hours.  Thyroid Function Tests: No results for input(s): TSH, T4TOTAL, FREET4, T3FREE, THYROIDAB in the last 72 hours.  Anemia Panel: No results for input(s): VITAMINB12, FOLATE, FERRITIN, TIBC, IRON, RETICCTPCT in the last 72 hours.  Urine analysis:    Component Value Date/Time   COLORURINE YELLOW 08/30/2016 1926   APPEARANCEUR HAZY (A) 08/30/2016 1926   LABSPEC 1.020 08/30/2016 1926   PHURINE 5.0 08/30/2016 1926   GLUCOSEU >=500 (A) 08/30/2016 1926   HGBUR MODERATE (A) 08/30/2016 1926   BILIRUBINUR NEGATIVE 08/30/2016 1926   KETONESUR 20 (A) 08/30/2016 1926   PROTEINUR 100 (A) 08/30/2016 1926   UROBILINOGEN 0.2 04/18/2015 0210   NITRITE NEGATIVE 08/30/2016 1926    LEUKOCYTESUR NEGATIVE 08/30/2016 1926    Sepsis Labs: Lactic Acid, Venous    Component Value Date/Time   LATICACIDVEN 0.9 09/08/2016 1453    MICROBIOLOGY: Recent Results (from the past 240 hour(s))  Culture, blood (routine x 2)     Status: None (Preliminary result)   Collection Time: 10/09/16 12:55 AM  Result Value Ref Range Status   Specimen Description BLOOD RIGHT HAND  Final   Special Requests IN PEDIATRIC BOTTLE 3ML  Final   Culture NO GROWTH 1 DAY  Final   Report Status PENDING  Incomplete  Culture, blood (routine x 2)     Status: None (Preliminary result)   Collection Time: 10/09/16  6:08 AM  Result Value Ref Range Status   Specimen Description BLOOD RIGHT HAND  Final   Special Requests BOTTLES DRAWN AEROBIC AND ANAEROBIC  5CC  Final   Culture NO GROWTH 1 DAY  Final   Report Status PENDING  Incomplete  MRSA PCR Screening     Status: None   Collection Time: 10/09/16  9:48 AM  Result Value Ref Range Status   MRSA by PCR NEGATIVE NEGATIVE Final    Comment:        The GeneXpert MRSA Assay (FDA approved for NASAL specimens only), is one component of a comprehensive MRSA colonization surveillance program. It is not intended to diagnose MRSA infection nor to guide or monitor treatment for MRSA infections.   Aerobic/Anaerobic Culture (surgical/deep wound)     Status: None (Preliminary result)   Collection Time: 10/09/16  2:10 PM  Result Value Ref Range Status   Specimen Description TISSUE RIGHT ARM  Final   Special Requests PATIENT ON FOLLOWING VANC  Final   Gram Stain   Final    MODERATE WBC PRESENT, PREDOMINANTLY PMN NO ORGANISMS SEEN    Culture   Final    RARE PSEUDOMONAS AERUGINOSA RARE KLEBSIELLA OXYTOCA CRITICAL RESULT CALLED TO, READ BACK BY AND VERIFIED WITH: T PHILLIP,RN AT 1036 10/10/16 BY L BENFIELD CONCERNING GROWTH ON CULTURE SUSCEPTIBILITIES TO FOLLOW FOR KLEBSIELLA NO ANAEROBES ISOLATED; CULTURE IN PROGRESS FOR 5 DAYS    Report Status PENDING   Incomplete   Organism ID, Bacteria PSEUDOMONAS AERUGINOSA  Final      Susceptibility   Pseudomonas aeruginosa - MIC*    CEFTAZIDIME 2 SENSITIVE Sensitive     CIPROFLOXACIN <=0.25 SENSITIVE Sensitive     GENTAMICIN <=1 SENSITIVE Sensitive     IMIPENEM 2 SENSITIVE Sensitive     PIP/TAZO 8 SENSITIVE Sensitive     CEFEPIME <=1  SENSITIVE Sensitive     * RARE PSEUDOMONAS AERUGINOSA  Acid Fast Smear (AFB)     Status: None   Collection Time: 10/09/16  2:10 PM  Result Value Ref Range Status   AFB Specimen Processing Comment  Final    Comment: Tissue Grinding and Digestion/Decontamination   Acid Fast Smear Negative  Final    Comment: (NOTE) Performed At: [facility] [place], [place] [id number] Lindon Romp MD HQ:[id number]    Source (AFB) TISSUE  Final    Comment: RIGHT ARM     RADIOLOGY STUDIES/RESULTS: Dg Ribs Unilateral W/chest Right  Result Date: 10/09/2016 CLINICAL DATA:  Right-sided rib pain EXAM: RIGHT RIBS AND CHEST - 3+ VIEW COMPARISON:  Chest radiograph 09/10/2016 FINDINGS: There is a left-sided approach PICC line with tip at the cavoatrial junction. Unchanged cardiomegaly with atherosclerotic calcification in the aortic arch. No pneumothorax or sizable pleural effusion. Diffusely increased pulmonary markings. No focal consolidation. No rib fracture is identified. IMPRESSION: 1. No rib fracture. 2. Cardiomegaly and aortic atherosclerosis. Electronically Signed   By: Ulyses Jarred M.D.   On: 10/09/2016 01:01   Dg Elbow 2 Views Right  Result Date: 10/09/2016 CLINICAL DATA:  Right elbow pain for 1 month.  Multiple ulcers. EXAM: RIGHT ELBOW - 2 VIEW COMPARISON:  CT from earlier today FINDINGS: Extensive soft tissue gas and swelling in the arm. Extent is greater than seen on this exam, but was covered on prior CT. No visible progression from prior. There are multiple open wounds per report. A large ulcer noted over olecranon spurring. Findings suggest  necrotizing infection. Osteopenia. No visible infectious bony erosion. Possible joint effusion, limited by overlapping gas bubbles. Patient has already been seen by surgery and there are plans for OR debridement. IMPRESSION: 1. Diffuse soft tissue gas and swelling in the arm as seen with necrotizing infection. No visible progression compared to preceding CT. 2. Large ulcer over an olecranon enthesophyte, possible exposed bone. Electronically Signed   By: Monte Fantasia M.D.   On: 10/09/2016 09:57   Ct Humerus Right W Contrast  Result Date: 10/09/2016 CLINICAL DATA:  71 year old male with right arm wounds with current drainage and surrounding erythema. EXAM: CT OF THE UPPER RIGHT EXTREMITY WITH CONTRAST TECHNIQUE: Multidetector CT imaging of the upper right extremity was performed according to the standard protocol following intravenous contrast administration. COMPARISON:  CT of the right elbow dated 09/09/2016 CONTRAST:  100 cc Isovue-300 FINDINGS: Bones/Joint/Cartilage The bones are osteopenic. No acute fracture for dislocation. Old healed right clavicular fracture noted. No periosteal elevation or erosive changes of the bone to suggest osteomyelitis. Evaluation however is limited due to osteopenia. MRI or a white blood cell nuclear scan may provide better evaluation if there is high clinical concern for osteomyelitis. Ligaments Suboptimally assessed by CT. Muscles and Tendons No acute findings.  No intramuscular hematoma. Soft tissues There is extensive diffuse subcutaneous edema. There is skin defect with open wound in the medial aspect of the distal arm. A second open wound noted in the dorsal aspect distal on approximately 7 cm above the elbow. Additional open wounds likely present but outside of the field of view. Subcutaneous air communicates with skin defect and extends superficial to the muscular fascia. There has been significant interval increase in the amount of soft tissue air compared to the  prior CT. No discrete drainable fluid collection or abscess identified. IMPRESSION: 1. Open skin wounds with extensive amount of soft tissue air dissecting through the subcutaneous fat  superficial to the muscle fascia. There has been interval increase in the size of the soft tissue air compared to the prior CT. 2. No definite evidence of osteomyelitis by CT. MRI or a WBC nuclear scan may provide better evaluation if there is high clinical concern for osteomyelitis. 3. Diffuse subcutaneous soft tissue edema. No discrete drainable fluid collection or abscess identified. Electronically Signed   By: Anner Crete M.D.   On: 10/09/2016 03:07     LOS: 2 days   Oren Binet, MD  [facility] Pager:[phone]  If 7PM-7AM, please contact night-coverage [email or web address] Password [facility] 10/11/2016, 10:59 AM

## 2016-10-11 NOTE — Transfer of Care (Signed)
Immediate Anesthesia Transfer of Care Note  Patient: Andrew [name]  Procedure(s) Performed: Procedure(s): IRRIGATION AND DEBRIDEMENT EXTREMITY RIGHT ARM WOUND (N/A)  Patient Location: PACU  Anesthesia Type:General  Level of Consciousness: awake, oriented and sedated  Airway & Oxygen Therapy: Patient Spontanous Breathing and Patient connected to nasal cannula oxygen  Post-op Assessment: Report given to RN, Post -op Vital signs reviewed and stable and Patient moving all extremities X 4  Post vital signs: Reviewed and stable  Last Vitals:  Vitals:   10/10/16 2048 10/11/16 0557  BP: 135/61 (!) 146/63  Pulse: 70 73  Resp: 18 18  Temp: 36.8 C 36.8 C    Last Pain:  Vitals:   10/11/16 0557  TempSrc: Oral  PainSc:          Complications: No apparent anesthesia complications

## 2016-10-11 NOTE — Progress Notes (Signed)
[facility]  Active pt with [facility].  [facility] hospital team will continue to follow pt while inpatient to support transition home to ensure [facility] needs are met.   If patient discharges after hours, please call [phone].   Andrew [name] 10/11/2016, 9:02 PM

## 2016-10-12 ENCOUNTER — Encounter (HOSPITAL_COMMUNITY): Payer: Self-pay | Admitting: Orthopaedic Surgery

## 2016-10-12 LAB — GLUCOSE, CAPILLARY
GLUCOSE-CAPILLARY: 115 mg/dL — AB (ref 65–99)
GLUCOSE-CAPILLARY: 161 mg/dL — AB (ref 65–99)
GLUCOSE-CAPILLARY: 192 mg/dL — AB (ref 65–99)
Glucose-Capillary: 134 mg/dL — ABNORMAL HIGH (ref 65–99)
Glucose-Capillary: 206 mg/dL — ABNORMAL HIGH (ref 65–99)

## 2016-10-12 LAB — BASIC METABOLIC PANEL
Anion gap: 6 (ref 5–15)
BUN: 10 mg/dL (ref 6–20)
CO2: 29 mmol/L (ref 22–32)
CREATININE: 0.52 mg/dL — AB (ref 0.61–1.24)
Calcium: 8.5 mg/dL — ABNORMAL LOW (ref 8.9–10.3)
Chloride: 101 mmol/L (ref 101–111)
GFR calc Af Amer: >60 mL/min (ref >60)
Glucose, Bld: 127 mg/dL — ABNORMAL HIGH (ref 65–99)
Potassium: 4.1 mmol/L (ref 3.5–5.1)
SODIUM: 136 mmol/L (ref 135–145)

## 2016-10-12 LAB — CBC
HCT: 25.7 % — ABNORMAL LOW (ref 39.0–52.0)
Hemoglobin: 8.5 g/dL — ABNORMAL LOW (ref 13.0–17.0)
MCH: 29.3 pg (ref 26.0–34.0)
MCHC: 33.1 g/dL (ref 30.0–36.0)
MCV: 88.6 fL (ref 78.0–100.0)
PLATELETS: 378 10*3/uL (ref 150–400)
RBC: 2.9 MIL/uL — ABNORMAL LOW (ref 4.22–5.81)
RDW: 15.4 % (ref 11.5–15.5)
WBC: 11.8 10*3/uL — ABNORMAL HIGH (ref 4.0–10.5)

## 2016-10-12 MED ORDER — INSULIN ASPART 100 UNIT/ML ~~LOC~~ SOLN
0.0000 [IU] | Freq: Three times a day (TID) | SUBCUTANEOUS | Status: DC
  Administered 2016-10-12: 2 [IU] via SUBCUTANEOUS
  Administered 2016-10-12: 3 [IU] via SUBCUTANEOUS
  Administered 2016-10-13: 7 [IU] via SUBCUTANEOUS
  Administered 2016-10-13 (×2): 5 [IU] via SUBCUTANEOUS
  Administered 2016-10-14: 7 [IU] via SUBCUTANEOUS
  Administered 2016-10-14: 2 [IU] via SUBCUTANEOUS
  Administered 2016-10-14: 5 [IU] via SUBCUTANEOUS
  Administered 2016-10-14: 3 [IU] via SUBCUTANEOUS
  Administered 2016-10-15: 2 [IU] via SUBCUTANEOUS

## 2016-10-12 MED ORDER — OXYCODONE-ACETAMINOPHEN 5-325 MG PO TABS
1.0000 | ORAL_TABLET | Freq: Four times a day (QID) | ORAL | Status: DC | PRN
Start: 2016-10-12 — End: 2016-10-15
  Administered 2016-10-12: 2 via ORAL
  Filled 2016-10-12: qty 2

## 2016-10-12 NOTE — Progress Notes (Signed)
Patient is stable this morning with RUE in splint and VAC with good seal and suction.  At this point, I have contacted plastic surgery for their expertise in wound coverage.  I am available as needed.    Azucena Cecil, MD [place] 7:22 AM

## 2016-10-12 NOTE — Progress Notes (Signed)
Occupational Therapy Evaluation Patient Details Name: Andrew [name] MRN: [id number] DOB: 08-14-1946 Today's Date: 10/12/2016    History of Present Illness Patient is a 71 y.o. male with hypertension, hyperlipidemia, diabetes mellitus, anxiety, AAA 4.8 cm on MRI (04/09/15), right eye blindness, and dCHF and recent prolonged hospitalization for MRSA bacteremia felt to be secondary to olecranon bursitis sent home on IV vancomycin, referred back to the hospital on 2/22 by home health RN for worsening right elbow/right arm wounds. now s/p I&D R elbow including bone excisement with placement of wound vac.   Clinical Impression   Pt with complex PMH and admitted for above.  Attempted evaluation. Pt apparently living at home with his son and other family members but states he is alone at times during the day. Pt also states he has fallen several times at home. Pt declining any mobility beyond moving briefly in the bed to adjust bed pad and repeatedly telling therapists to "leave him alone". Prior to session, pt states he is not in pain, however, during session, pt repeatedly complained of pain being the limiting factor and that he was not "doing anything" and he was just going home. Nsg offered pt pain meds. Pt continued to decline further mobility. Will follow acutely as pt will participate to address established goals. . Recommend SNF for rehab. If pt not willing to go to SNF, he will need 24/7 assistance.      Follow Up Recommendations  SNF;Supervision/Assistance - 24 hour    Equipment Recommendations  3 in 1 bedside commode (unsure what pt has)    Recommendations for Other Services Other (comment) (palliative )     Precautions / Restrictions Precautions Precautions: Fall Precaution Comments: wound vac L elbow Restrictions Weight Bearing Restrictions: Yes RUE Weight Bearing: Non weight bearing      Mobility Bed Mobility            Pt able to roll to sidelying L) with S using rail  to adjust bedpad   General bed mobility comments: Pt moving BLE in bed and attempts to bridge. Able to roll to R side as willing  Transfers                 General transfer comment: declined    Balance                                            ADL Overall ADL's : Needs assistance/impaired         Upper Body Bathing: Moderate assistance   Lower Body Bathing: Moderate assistance   Upper Body Dressing : Moderate assistance   Lower Body Dressing: Maximal assistance                 General ADL Comments: Evaluation limted as pt participation was limited. Pt is able to to use his LUE to assist with ADL as he is willing to do so      Vision Baseline Vision/History:  (blind R eye)       Perception     Praxis      Pertinent Vitals/Pain Pain Assessment: Faces Faces Pain Scale: Hurts even more Pain Location: legs "all over" Pain Descriptors / Indicators: Discomfort;Grimacing;Moaning Pain Intervention(s): Limited activity within patient's tolerance;Other (comment) (nsg notified)     Hand Dominance Left ("I have to be Left " handed)   Extremity/Trunk Assessment Upper Extremity Assessment  Upper Extremity Assessment: Generalized weakness;RUE deficits/detail RUE Deficits / Details: postop splint  @ 100 degrees elbow flexion. wirst ROM WFL. Amputated digits. Unsure of shoulder ROM as pt moving it @ 45 FF but would not raise it higher RUE Coordination: decreased fine motor;decreased gross motor   Lower Extremity Assessment Lower Extremity Assessment: Generalized weakness;Defer to PT evaluation       Communication Communication Communication: No difficulties   Cognition Arousal/Alertness: Awake/alert Behavior During Therapy: Restless;Agitated (Grumpy) Overall Cognitive Status: No family/caregiver present to determine baseline cognitive functioning Area of Impairment: Safety/judgement;Awareness;Attention;Problem solving   Current Attention  Level: Sustained     Safety/Judgement: Decreased awareness of safety;Decreased awareness of deficits Awareness: Intellectual Problem Solving: Slow processing;Requires verbal cues General Comments: cognition impaired. Poor insight and awareness into deficits and necessary level of participation requried to increase his independence. Pt states he can't walk but if he had to go home he would "just walk or crawl"   General Comments       Exercises       Shoulder Instructions      Home Living Family/patient expects to be discharged to:: Private residence Living Arrangements: Children Available Help at Discharge: Family (pt reports his son does not work) Type of Home: Mobile home Home Access: Stairs to enter Technical brewer of Steps: 3 Entrance Stairs-Rails: Right;Left;Can reach both Home Layout: One level     Bathroom Shower/Tub: Tub/shower unit Shower/tub characteristics: Architectural technologist: Standard Bathroom Accessibility: Yes How Accessible: Accessible via wheelchair Home Equipment: Wheelchair - manual   Additional Comments: unsure what other equipment pt has; pt states home is w/c accessible but difficult to push  w/c on carpet      Prior Functioning/Environment Level of Independence: Needs assistance  Gait / Transfers Assistance Needed: needs help from his son to walk; reports multiple falls -per PT note ADL's / Homemaking Assistance Needed: states his sister in law helps him bath and does the cooking/cleaning/   Comments: Questionable historian.        OT Problem List: Decreased strength;Decreased range of motion;Decreased activity tolerance;Impaired balance (sitting and/or standing);Impaired vision/perception;Decreased coordination;Decreased cognition;Decreased safety awareness;Decreased knowledge of use of DME or AE;Decreased knowledge of precautions;Impaired sensation;Impaired UE functional use;Pain;Increased edema      OT Treatment/Interventions:  Self-care/ADL training;Therapeutic exercise;DME and/or AE instruction;Therapeutic activities;Cognitive remediation/compensation;Patient/family education;Balance training    OT Goals(Current goals can be found in the care plan section) Acute Rehab OT Goals Patient Stated Goal: to be left alone OT Goal Formulation: Patient unable to participate in goal setting Time For Goal Achievement: 10/26/16 Potential to Achieve Goals: Fair ADL Goals Pt Will Perform Upper Body Bathing: with min assist;sitting Pt Will Transfer to Toilet: with mod assist;bedside commode Additional ADL Goal #1: Pt will sit EOB x 10 min with S during simple ADL task  OT Frequency: Min 2X/week   Barriers to D/C:            Co-evaluation PT/OT/SLP Co-Evaluation/Treatment: Yes Reason for Co-Treatment: Complexity of the patient's impairments (multi-system involvement);Necessary to address cognition/behavior during functional activity;For patient/therapist safety   OT goals addressed during session: ADL's and self-care      End of Session Nurse Communication: Other (comment) (pt's participation)  Activity Tolerance: Other (comment) (lmited by level of participation) Patient left: in bed;with call bell/phone within reach;with bed alarm set;with nursing/sitter in room  OT Visit Diagnosis: Repeated falls (R29.6);Muscle weakness (generalized) (M62.81)                ADL either performed or  assessed with clinical judgement  Time: 1041-1110 OT Time Calculation (min): 29 min Charges:  OT General Charges $OT Visit: 1 Procedure OT Evaluation $OT Eval Moderate Complexity: 1 Procedure G-Codes:     Dhairya Corales, OT/L  [id number] 10/12/2016  Faviola Klare,[name] 10/12/2016, 11:36 AM

## 2016-10-12 NOTE — Progress Notes (Signed)
Andrew [name] is a 70 y.o. male with history fo HTN, T2DM,anxiety disorder, right eye blindness, recent admission for sepsis due to MRSA bacteremia from septic olecranon bursitis 08/31/2016 who was readmitted on 10/09/16 with weakness and progressive worsening of RUE wounds. He has required serial I and D with VAC placement by Dr. Erlinda Hong. CT RUE without osteomyelitis and wound cultures positive for pseudomonas aeruginosa. Dr. Linus Salmons consulted for input and recommends discontinuing Vancomycin at discharge and transition to high dose oral Cipro for 6 weeks. Plastics consulted for input on wound care. CIR recommended per ortho.  Patient and family refused SNF at last admission and family was providing care. He is refusing therapy today and reports that he wants to go home. I briefly talked to patient about CIR and he stated "NO". He reports that family will continue to assist him after discharge. Will defer CIR consult and recommend home health therapy for follow up after discharge.

## 2016-10-12 NOTE — Progress Notes (Signed)
PT has evaluated pt. and is recommending post acute rehab.  Prescreen request for CIR  has been initiated.  Patient was screened by Gerlean Ren for appropriateness for an Inpatient Acute Rehab consult.  Pt's BCBS Medicare is unlikely to authorize an IP Rehab admission, however if MD would like [place] to attempt, consult could be ordered.  Please call if questions.   [facility] Admissions Coordinator Cell [phone] Office [phone]

## 2016-10-12 NOTE — Progress Notes (Signed)
Physical Therapy Treatment Patient Details Name: Andrew [name] MRN: [id number] DOB: [birth year] Today's Date: 10/12/2016    History of Present Illness Patient is a 71 y.o. male with hypertension, hyperlipidemia, diabetes mellitus, anxiety, AAA 4.8 cm on MRI (04/09/15), right eye blindness, and dCHF and recent prolonged hospitalization for MRSA bacteremia felt to be secondary to olecranon bursitis sent home on IV vancomycin, referred back to the hospital on 2/22 by home health RN for worsening right elbow/right arm wounds. now s/p I&D R elbow including bone excisement with placement of wound vac.    PT Comments    Pt not progressing toward goals. Pt declining any mobility beyond minimal bed mobility and repeatedly telling therapists to "leave him alone". Spent extensive time educating pt on role of PT and how it would help him in returning home and to fishing. Pt reports excess pain and states that is limiting his participation, but does not report pain to RN prior to session and refuses pain meds. Pt would benefit from referral for palliative care to address patient goals as he states that he would be fine to lay in bed and die but also reports wanting to return home to fish. Pt would benefit from d/c to SNF to return to mod-I level of function for safe return home when medically ready. Pt will require 24 hour assist if refusing SNF due to physical assist needed for mobility. Will continue to follow as able.   Follow Up Recommendations  SNF;Supervision/Assistance - 24 hour     Equipment Recommendations   (to be determined; may need hemiwalker)    Recommendations for Other Services Other (comment) (palliative care)     Precautions / Restrictions Precautions Precautions: Fall Precaution Comments: wound vac R elbow Required Braces or Orthoses: Sling (R UE) Restrictions Weight Bearing Restrictions: Yes RUE Weight Bearing: Non weight bearing    Mobility  Bed Mobility Overal bed  mobility: Needs Assistance Bed Mobility: Rolling Rolling: Min assist         General bed mobility comments: moving all extremtites in bed; attempts to bridge to assist with moving up in bed but doesn't push evenly between LE; able to roll to sides but resistant; max encouragement to participate  Transfers                 General transfer comment: declined stating he just wants to be left alone in bed   Ambulation/Gait             General Gait Details: refused stating he can't walk   Stairs            Wheelchair Mobility    Modified Rankin (Stroke Patients Only)       Balance Overall balance assessment: Modified Independent   Sitting balance-Leahy Scale: Fair Sitting balance - Comments: pt observed sitting EOB eating lunch                             Cognition Arousal/Alertness: Awake/alert Behavior During Therapy: Agitated Overall Cognitive Status: No family/caregiver present to determine baseline cognitive functioning Area of Impairment: Safety/judgement;Awareness;Problem solving   Current Attention Level: Sustained     Safety/Judgement: Decreased awareness of safety;Decreased awareness of deficits Awareness: Emergent Problem Solving: Slow processing;Difficulty sequencing;Requires verbal cues General Comments: poor insight into deficits and participation required to return to PLOF; pt states he cannot walk because his LE don't work but that if he had to go home he would walk or  crawl    Exercises      General Comments        Pertinent Vitals/Pain Pain Assessment: Faces Faces Pain Scale: Hurts even more Pain Location: legs "all over" Pain Descriptors / Indicators: Discomfort;Grimacing;Moaning Pain Intervention(s): Limited activity within patient's tolerance;Other (comment) (notified RN)    Home Living Family/patient expects to be discharged to:: Private residence Living Arrangements: Children Available Help at Discharge:  Family (pt reports his son does not work) Type of Home: Mobile home Home Access: Stairs to enter Entrance Stairs-Rails: Right;Left;Can reach both [place]: One [place]: Wheelchair - manual Additional Comments: unsure what other equipment pt has; pt states home is w/c accessible but difficult to push  w/c on carpet    Prior Function Level of Independence: Needs assistance  Gait / Transfers Assistance Needed: needs help from his son to walk; reports multiple falls -per PT note ADL's / Homemaking Assistance Needed: states his sister in law helps him bath and does the cooking/cleaning/ Comments: Questionable historian.   PT Goals (current goals can now be found in the care plan section) Acute Rehab PT Goals Patient Stated Goal: to win last fishing copetition Progress towards PT goals: Not progressing toward goals - comment (decreased level of participation; wants to be left alone)    Frequency    Min 3X/week      PT Plan Other (comment) (request palliative care consult to determine pts desires/goa)    Co-evaluation PT/OT/SLP Co-Evaluation/Treatment: Yes Reason for Co-Treatment: Complexity of the patient's impairments (multi-system involvement);Necessary to address cognition/behavior during functional activity;For patient/therapist safety PT goals addressed during session: Mobility/safety with mobility OT goals addressed during session: ADL's and self-care     End of Session   Activity Tolerance: Treatment limited secondary to agitation Patient left: in bed;with call bell/phone within reach;with bed alarm set;with nursing/sitter in room Nurse Communication: Other (comment) (decreased level of participation and complaints of pain) PT Visit Diagnosis: Pain;Unsteadiness on feet (R26.81);Other abnormalities of gait and mobility (R26.89);Muscle weakness (generalized) (M62.81);History of falling (Z91.81) Pain - Right/Left: Right Pain - part of body: Arm     Time:  1040-1110 PT Time Calculation (min) (ACUTE ONLY): 30 min  Charges:  $Therapeutic Activity: 8-22 mins                    G Codes:       Tracie Harrier November 08, 2016, 1:23 PM   Tracie Harrier, SPT Acute Rehab SPT [phone]

## 2016-10-12 NOTE — Progress Notes (Signed)
PROGRESS NOTE        PATIENT DETAILS Name: Andrew [name] Age: 71 y.o. Sex: male Date of Birth: May 05, 1946 Admit Date: 10/08/2016 Admitting Physician Etta Quill, DO OZD:GUYQIH,KVQQ HENRY, MD  Brief Narrative: Patient is a 71 y.o. male with recent prolonged hospitalization for MRSA bacteremia felt to be secondary to olecranon bursitis sent home on IV vancomycin, referred back to the hospital on 2/22 by home health RN for worsening right elbow/right arm wounds. He was subsequently admitted for further evaluation and treatment.  Subjective: Lying comfortably in bed.  Assessment/Plan: Right arm/elbow necrotizing infection with osteomyelitis: Underwent excisional debridement on 2/23 and 2/25. Wound VAC in place, orthopedics planning on consulting plastic surgery. Per orthopedic op note on 2/25-bone excisional debridement done as well. Intraoperative cultures on 2/23 positive for Pseudomonas and Klebsiella. Seen by infectious disease, awaiting final culture results-but will likely require at least 6 more weeks of antimicrobial therapy from 2/23. Plans are to continue intravenous vancomycin while patient is inpatient-and discontinue on discharge. Orthopedics has consulted plastic surgery for further wound care expertise-await plastics evaluation.    Recent history of MRSA bacteremia: Has mild leukocytosis-but afebrile and does not appear toxic. Continue IV vancomycin, blood cultures on 2/23 negative so far. Per last discharge summary-stop date 10/12/16. Infectious disease recommending that we continue vancomycin until discharge from the hospital.  Insulin-dependent type 2 diabetes: Did have mild hypoglycemia on 2/23-but CBGs much more stable. Continue Lantus 10 units and SSI.  Have counseled patient extensively regarding importance of optimal glycemic control in order for wound healing, infection control. We will continue to follow and optimize accordingly.  Anemia:  Likely secondary to acute illness-no evidence of overt blood loss. He required 1 unit of PRBC of transfusion, hemoglobin stable at 8.5. Continue to follow CBC.   Chronic diastolic heart failure: Clinically compensated, follow clinically.  Hypertension: BP well controlled-continue with metoprolol and amlodipine.  Dyslipidemia: Continue statin.  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  History of dysphagia: Per last discharge summary he was started on a dysphagia 2 diet-repeat speech therapy evaluation completed-recommendations are for regular diet.   Tobacco abuse: Continue transdermal nicotine. I do not think he has any inclination to quit at this time.  DVT Prophylaxis: Prophylactic Lovenox   Code Status: Full code   Family Communication: None at bedside  Disposition Plan: Remain inpatient-will consult CIR-needs several more days of inpatient stay prior to discharge.  Antimicrobial agents: Anti-infectives    Start     Dose/Rate Route Frequency Ordered Stop   10/11/16 2200  vancomycin (VANCOCIN) 1,250 mg in sodium chloride 0.9 % 250 mL IVPB     1,250 mg 166.7 mL/hr over 90 Minutes Intravenous Every 12 hours 10/11/16 1358     10/11/16 2200  ceFEPIme (MAXIPIME) 2 g in dextrose 5 % 50 mL IVPB     2 g 100 mL/hr over 30 Minutes Intravenous Every 8 hours 10/11/16 1405     10/10/16 1400  ceFEPIme (MAXIPIME) 1 g in dextrose 5 % 50 mL IVPB  Status:  Discontinued     1 g 100 mL/hr over 30 Minutes Intravenous Every 8 hours 10/10/16 1140 10/11/16 1405   10/09/16 1715  vancomycin IVPB  Status:  Discontinued    Comments:  Indication: MRSA bacteremia Last Day of Therapy:  10/12/16 Labs - Sunday/Monday:  CBC/D, BMP, and vancomycin trough. Labs - Thursday:  BMP and vancomycin trough Labs - Every other week:  ESR and CRP     75 0 mg Intravenous Every 12 hours 10/09/16 1701 10/09/16 1707   10/09/16 1200  vancomycin (VANCOCIN) 1,250 mg  in sodium chloride 0.9 % 250 mL IVPB  Status:  Discontinued     1,250 mg 166.7 mL/hr over 90 Minutes Intravenous Every 12 hours 10/09/16 1002 10/11/16 1358      Procedures: None  CONSULTS:  orthopedic surgery  Infectious disease Plastic surgery  Time spent: 25- minutes-Greater than 50% of this time was spent in counseling, explanation of diagnosis, planning of further management, and coordination of care.  MEDICATIONS: Scheduled Meds: . amLODipine  10 mg Oral Daily  . aspirin  325 mg Oral Daily  . atorvastatin  20 mg Oral q1800  . ceFEPime (MAXIPIME) IV  2 g Intravenous Q8H  . Chlorhexidine Gluconate Cloth  6 each Topical Q0600  . enoxaparin (LOVENOX) injection  40 mg Subcutaneous Q24H  . feeding supplement (ENSURE ENLIVE)  237 mL Oral Q24H  . gabapentin  100 mg Oral TID  . insulin aspart  0-9 Units Subcutaneous Q4H  . insulin glargine  10 Units Subcutaneous Daily  . metoprolol succinate  100 mg Oral Daily  . nicotine  21 mg Transdermal Daily  . nystatin   Topical TID  . sodium chloride flush  3 mL Intravenous Q12H  . thiamine  100 mg Oral Daily  . vancomycin  1,250 mg Intravenous Q12H   Continuous Infusions:  PRN Meds:.sodium chloride flush   PHYSICAL EXAM: Vital signs: Vitals:   10/11/16 1551 10/11/16 1757 10/11/16 1935 10/12/16 0455  BP: (!) 122/58 (!) 142/62 130/60 135/63  Pulse: 64 69 71 65  Resp: 16 16 16 16   Temp: 97.6 F (36.4 C) 98.2 F (36.8 C) 98 F (36.7 C) 98.3 F (36.8 C)  TempSrc: Oral Oral Oral Oral  SpO2: 99% 100% 98% 98%  Weight:      Height:       Filed Weights   10/09/16 0152  Weight: 66.7 kg (147 lb)   Body mass index is 22.35 kg/m.   General appearance :Awake, alert, not in any distress. Speech Clear.  Eyes:, pupils equally reactive to light and accomodation,no scleral icterus. HEENT: Atraumatic and Normocephalic Neck: supple, no JVD. No cervical lymphadenopathy.  Resp:Good air entry bilaterally CVS: S1 S2 regular GI: Bowel  sounds present, Non tender and not distended with no gaurding, rigidity or rebound. Extremities: B/L Lower Ext shows no edema, both legs are warm to touch.Right arm dressing in place-did not open. Neurology:  speech clear,Non focal, sensation is grossly intact. Psychiatric: Normal judgment and insight. Alert and oriented x 3. Normal mood. Musculoskeletal:No digital cyanosis Skin:No Rash, warm and dry Wounds:N/A  I have personally reviewed following labs and imaging studies  LABORATORY DATA: CBC:  Recent Labs Lab 10/09/16 0049 10/10/16 0420 10/11/16 1139 10/12/16 0450  WBC 14.1* 11.1* 11.7* 11.8*  NEUTROABS 8.7*  --   --   --   HGB 11.0* 7.9* 7.7* 8.5*  HCT 34.1* 24.5* 23.7* 25.7*  MCV 88.3 88.4 87.8 88.6  PLT 415* 379 397 168    Basic Metabolic Panel:  Recent Labs Lab 10/09/16 0049 10/10/16 0420 10/11/16 1139 10/12/16 0450  NA 137 134* 136 136  K 4.1 4.0 3.9 4.1  CL 101 98* 103 101  CO2 26 28 27 29   GLUCOSE 166* 189*  231* 127*  BUN 9 12 11 10   CREATININE 0.53* 0.55* 0.55* 0.52*  CALCIUM 9.1 8.2* 8.2* 8.5*    GFR: Estimated Creatinine Clearance: 79.9 mL/min (by C-G formula based on SCr of 0.52 mg/dL (L)).  Liver Function Tests: No results for input(s): AST, ALT, ALKPHOS, BILITOT, PROT, ALBUMIN in the last 168 hours. No results for input(s): LIPASE, AMYLASE in the last 168 hours. No results for input(s): AMMONIA in the last 168 hours.  Coagulation Profile: No results for input(s): INR, PROTIME in the last 168 hours.  Cardiac Enzymes: No results for input(s): CKTOTAL, CKMB, CKMBINDEX, TROPONINI in the last 168 hours.  BNP (last 3 results) No results for input(s): PROBNP in the last 8760 hours.  HbA1C: No results for input(s): HGBA1C in the last 72 hours.  CBG:  Recent Labs Lab 10/11/16 1658 10/11/16 2027 10/12/16 0029 10/12/16 0442 10/12/16 0816  GLUCAP 207* 267* 115* 134* 192*    Lipid Profile: No results for input(s): CHOL, HDL, LDLCALC,  TRIG, CHOLHDL, LDLDIRECT in the last 72 hours.  Thyroid Function Tests: No results for input(s): TSH, T4TOTAL, FREET4, T3FREE, THYROIDAB in the last 72 hours.  Anemia Panel: No results for input(s): VITAMINB12, FOLATE, FERRITIN, TIBC, IRON, RETICCTPCT in the last 72 hours.  Urine analysis:    Component Value Date/Time   COLORURINE YELLOW 08/30/2016 1926   APPEARANCEUR HAZY (A) 08/30/2016 1926   LABSPEC 1.020 08/30/2016 1926   PHURINE 5.0 08/30/2016 1926   GLUCOSEU >=500 (A) 08/30/2016 1926   HGBUR MODERATE (A) 08/30/2016 1926   BILIRUBINUR NEGATIVE 08/30/2016 1926   KETONESUR 20 (A) 08/30/2016 1926   PROTEINUR 100 (A) 08/30/2016 1926   UROBILINOGEN 0.2 04/18/2015 0210   NITRITE NEGATIVE 08/30/2016 1926   LEUKOCYTESUR NEGATIVE 08/30/2016 1926    Sepsis Labs: Lactic Acid, Venous    Component Value Date/Time   LATICACIDVEN 0.9 09/08/2016 1453    MICROBIOLOGY: Recent Results (from the past 240 hour(s))  Culture, blood (routine x 2)     Status: None (Preliminary result)   Collection Time: 10/09/16 12:55 AM  Result Value Ref Range Status   Specimen Description BLOOD RIGHT HAND  Final   Special Requests IN PEDIATRIC BOTTLE 3ML  Final   Culture NO GROWTH 2 DAYS  Final   Report Status PENDING  Incomplete  Culture, blood (routine x 2)     Status: None (Preliminary result)   Collection Time: 10/09/16  6:08 AM  Result Value Ref Range Status   Specimen Description BLOOD RIGHT HAND  Final   Special Requests BOTTLES DRAWN AEROBIC AND ANAEROBIC  5CC  Final   Culture NO GROWTH 2 DAYS  Final   Report Status PENDING  Incomplete  MRSA PCR Screening     Status: None   Collection Time: 10/09/16  9:48 AM  Result Value Ref Range Status   MRSA by PCR NEGATIVE NEGATIVE Final    Comment:        The GeneXpert MRSA Assay (FDA approved for NASAL specimens only), is one component of a comprehensive MRSA colonization surveillance program. It is not intended to diagnose MRSA infection nor  to guide or monitor treatment for MRSA infections.   Aerobic/Anaerobic Culture (surgical/deep wound)     Status: None (Preliminary result)   Collection Time: 10/09/16  2:10 PM  Result Value Ref Range Status   Specimen Description TISSUE RIGHT ARM  Final   Special Requests PATIENT ON FOLLOWING VANC  Final   Gram Stain   Final    MODERATE  WBC PRESENT, PREDOMINANTLY PMN NO ORGANISMS SEEN    Culture   Final    RARE PSEUDOMONAS AERUGINOSA RARE KLEBSIELLA OXYTOCA CRITICAL RESULT CALLED TO, READ BACK BY AND VERIFIED WITH: T PHILLIP,RN AT 1036 10/10/16 BY L BENFIELD CONCERNING GROWTH ON CULTURE SUSCEPTIBILITIES TO FOLLOW FOR KLEBSIELLA NO ANAEROBES ISOLATED; CULTURE IN PROGRESS FOR 5 DAYS    Report Status PENDING  Incomplete   Organism ID, Bacteria PSEUDOMONAS AERUGINOSA  Final      Susceptibility   Pseudomonas aeruginosa - MIC*    CEFTAZIDIME 2 SENSITIVE Sensitive     CIPROFLOXACIN <=0.25 SENSITIVE Sensitive     GENTAMICIN <=1 SENSITIVE Sensitive     IMIPENEM 2 SENSITIVE Sensitive     PIP/TAZO 8 SENSITIVE Sensitive     CEFEPIME <=1 SENSITIVE Sensitive     * RARE PSEUDOMONAS AERUGINOSA  Acid Fast Smear (AFB)     Status: None   Collection Time: 10/09/16  2:10 PM  Result Value Ref Range Status   AFB Specimen Processing Comment  Final    Comment: Tissue Grinding and Digestion/Decontamination   Acid Fast Smear Negative  Final    Comment: (NOTE) Performed At: [facility] [place], [place] [id number] Lindon Romp MD EL:[id number]    Source (AFB) TISSUE  Final    Comment: RIGHT ARM     RADIOLOGY STUDIES/RESULTS: Dg Ribs Unilateral W/chest Right  Result Date: 10/09/2016 CLINICAL DATA:  Right-sided rib pain EXAM: RIGHT RIBS AND CHEST - 3+ VIEW COMPARISON:  Chest radiograph 09/10/2016 FINDINGS: There is a left-sided approach PICC line with tip at the cavoatrial junction. Unchanged cardiomegaly with atherosclerotic calcification in the aortic arch. No  pneumothorax or sizable pleural effusion. Diffusely increased pulmonary markings. No focal consolidation. No rib fracture is identified. IMPRESSION: 1. No rib fracture. 2. Cardiomegaly and aortic atherosclerosis. Electronically Signed   By: Ulyses Jarred M.D.   On: 10/09/2016 01:01   Dg Elbow 2 Views Right  Result Date: 10/09/2016 CLINICAL DATA:  Right elbow pain for 1 month.  Multiple ulcers. EXAM: RIGHT ELBOW - 2 VIEW COMPARISON:  CT from earlier today FINDINGS: Extensive soft tissue gas and swelling in the arm. Extent is greater than seen on this exam, but was covered on prior CT. No visible progression from prior. There are multiple open wounds per report. A large ulcer noted over olecranon spurring. Findings suggest necrotizing infection. Osteopenia. No visible infectious bony erosion. Possible joint effusion, limited by overlapping gas bubbles. Patient has already been seen by surgery and there are plans for OR debridement. IMPRESSION: 1. Diffuse soft tissue gas and swelling in the arm as seen with necrotizing infection. No visible progression compared to preceding CT. 2. Large ulcer over an olecranon enthesophyte, possible exposed bone. Electronically Signed   By: Monte Fantasia M.D.   On: 10/09/2016 09:57   Ct Humerus Right W Contrast  Result Date: 10/09/2016 CLINICAL DATA:  71 year old male with right arm wounds with current drainage and surrounding erythema. EXAM: CT OF THE UPPER RIGHT EXTREMITY WITH CONTRAST TECHNIQUE: Multidetector CT imaging of the upper right extremity was performed according to the standard protocol following intravenous contrast administration. COMPARISON:  CT of the right elbow dated 09/09/2016 CONTRAST:  100 cc Isovue-300 FINDINGS: Bones/Joint/Cartilage The bones are osteopenic. No acute fracture for dislocation. Old healed right clavicular fracture noted. No periosteal elevation or erosive changes of the bone to suggest osteomyelitis. Evaluation however is limited due to  osteopenia. MRI or a white blood cell nuclear scan may provide  better evaluation if there is high clinical concern for osteomyelitis. Ligaments Suboptimally assessed by CT. Muscles and Tendons No acute findings.  No intramuscular hematoma. Soft tissues There is extensive diffuse subcutaneous edema. There is skin defect with open wound in the medial aspect of the distal arm. A second open wound noted in the dorsal aspect distal on approximately 7 cm above the elbow. Additional open wounds likely present but outside of the field of view. Subcutaneous air communicates with skin defect and extends superficial to the muscular fascia. There has been significant interval increase in the amount of soft tissue air compared to the prior CT. No discrete drainable fluid collection or abscess identified. IMPRESSION: 1. Open skin wounds with extensive amount of soft tissue air dissecting through the subcutaneous fat superficial to the muscle fascia. There has been interval increase in the size of the soft tissue air compared to the prior CT. 2. No definite evidence of osteomyelitis by CT. MRI or a WBC nuclear scan may provide better evaluation if there is high clinical concern for osteomyelitis. 3. Diffuse subcutaneous soft tissue edema. No discrete drainable fluid collection or abscess identified. Electronically Signed   By: Anner Crete M.D.   On: 10/09/2016 03:07     LOS: 3 days   Kyra Leyland, PA-S  If 7PM-7AM, please contact night-coverage [email or web address] Password [facility] 10/12/2016, 10:02 AM  Attending MD note Patient was seen, examined,treatment plan was discussed with the PA-S.  I have personally reviewed the clinical findings, lab, imaging studies and management of this patient in detail. I agree with the documentation, as recorded by the PA-S.   Patient anxious to be discharged-but after explaining severity of his right arm infection-he is willing to stay. Awaiting plastic surgery evaluation.  On  Exam: Gen. exam: Awake, alert, not in any distress Chest: Good air entry bilaterally, no rhonchi or rales CVS: S1-S2 regular, no murmurs Abdomen: Soft, nontender and nondistended Neurology: Non-focal Skin: No rash or lesions  Depression: Necrotizing right arm infection with probable underlying osteomyelitis Recent MSSA bacteremia-repeat blood cultures December admission negative Diabetes-better controlled Hypertension  Plan Continue empiric vancomycin and cefepime Await final wound/intraoperative cultures Await plastic eval Await CIR input Case management-to begin discharge planning  Rest as above  [facility]

## 2016-10-13 ENCOUNTER — Ambulatory Visit: Payer: Self-pay | Admitting: Plastic Surgery

## 2016-10-13 DIAGNOSIS — Z9689 Presence of other specified functional implants: Secondary | ICD-10-CM

## 2016-10-13 DIAGNOSIS — B9562 Methicillin resistant Staphylococcus aureus infection as the cause of diseases classified elsewhere: Secondary | ICD-10-CM

## 2016-10-13 LAB — GLUCOSE, CAPILLARY
GLUCOSE-CAPILLARY: 253 mg/dL — AB (ref 65–99)
Glucose-Capillary: 173 mg/dL — ABNORMAL HIGH (ref 65–99)
Glucose-Capillary: 272 mg/dL — ABNORMAL HIGH (ref 65–99)
Glucose-Capillary: 290 mg/dL — ABNORMAL HIGH (ref 65–99)
Glucose-Capillary: 306 mg/dL — ABNORMAL HIGH (ref 65–99)
Glucose-Capillary: 87 mg/dL (ref 65–99)

## 2016-10-13 LAB — TYPE AND SCREEN
Blood Product Expiration Date: 201803092359
Blood Product Expiration Date: 201803102359
ISSUE DATE / TIME: 201802250855
ISSUE DATE / TIME: 201802251524
UNIT TYPE AND RH: 6200
Unit Type and Rh: 6200

## 2016-10-13 MED ORDER — CIPROFLOXACIN HCL 500 MG PO TABS
750.0000 mg | ORAL_TABLET | Freq: Two times a day (BID) | ORAL | Status: DC
  Administered 2016-10-13 – 2016-10-15 (×5): 750 mg via ORAL
  Filled 2016-10-13 (×5): qty 2

## 2016-10-13 MED ORDER — INSULIN GLARGINE 100 UNIT/ML ~~LOC~~ SOLN
14.0000 [IU] | Freq: Every day | SUBCUTANEOUS | Status: DC
  Administered 2016-10-14: 14 [IU] via SUBCUTANEOUS
  Filled 2016-10-13: qty 0.14

## 2016-10-13 NOTE — Care Management Note (Addendum)
Case Management Note Marvetta Gibbons RN, BSN Unit 2W-Case Manager [phone]  Patient Details  Name: Andrew [name] MRN: [id number] Date of Birth: 27-Dec-1945  Subjective/Objective:  Pt admitted with septic bursitis- necrotizing infection of right elbow- s/p I&D x2 with bone excisional debridement done as well- wound VAC placed                   Action/Plan: PTA pt lived at home with son- was active with [facility] for [facility] services- RN/PT/OT/aide/SW- and IV abx- per PT/OT evals recommendations for SNF- however pt is refusing- stating he wants to return home- if pt returns home will need resumption of [facility] services (and per Dr. [name] in [place] will make pt [name])- plastics to consult - pt will most likely need home wound VAC have placed form on shadow chart for signature if needed for [facility] wound VAC. -- CM to continue to follow for d/c needs.  Addendum 3/2 D [name] RN CM- wound VAC delivered to room 3/1. Patient states he has WC at home. [facility] clinical liaison [name] notifed of DC and [place] status, patient to have hospital bed delivered to home 3/2 through [facility] had multiple attempts 3/1 to set up delivery with patient's daughter and they were unable to get a hold of her). Patient refused PTAR 3/1. CM verified patient continues to refuse PTAR today, CM will print out forms and place in chart so they are available if needed. Patient states he called neighbor [name] to come get him [phone]). He states he can get into a car  Expected Discharge Date:                  Expected Discharge Plan:  [place]  In-House Referral:  Clinical Social Work  Discharge planning Services  CM Consult  Post Acute Care Choice:  [place], Resumption of Svcs/PTA Provider Choice offered to:  Patient  DME Arranged:  Vac DME Agency:  [facility]  HH Arranged:  RN, PT, OT, Nurse's Aide, IV Antibiotics, Social Work [employer] Agency:  [facility]  Status of Service:  In process, will continue to  follow  If discussed at Long Length of Stay Meetings, dates discussed:    Additional Comments:  [name], RN 10/13/2016, 11:01 AM

## 2016-10-13 NOTE — Progress Notes (Signed)
Patient ID: Andrew [name], male   DOB: [birth year], 71 y.o.   MRN: [id number]  Plastic Surgery Follow up:  Patient is scheduled for OR this Thursday, 3/1 @ 7:30 am  [facility] Plastic Surgery [phone]

## 2016-10-13 NOTE — Care Management Important Message (Signed)
Important Message  Patient Details  Name: Andrew [name] MRN: [id number] Date of Birth: [birth year]   Medicare Important Message Given:  Yes    Nathen May 10/13/2016, 10:43 AM

## 2016-10-13 NOTE — Progress Notes (Addendum)
Follow-up: Notified by RN that pt found sitting on the floor after noises heard in his room. Pt admitted he fell while attempting to go to BR. States he hit his head but RN sees no objective signs of head trauma or other injury. Saw pt at bedside. He is noted oriented and able to recall events. States he slipped on water when he attempted to get OOB. Admits he "bumped" his head ((R) parietal area) on the bedside table but denies pain or h/a. No noted objective signs head trauma. No bony spine TTP. No abrasions or other injuries noted. Based on PE there are no findings to suggest need for imaging at this time. Bed alarm is on and pt once again instructed to call for assistance when attempting to get OOB. Pt verbalizes understanding.   Jeryl Columbia, NP-C [facility] Pager [phone]

## 2016-10-13 NOTE — Progress Notes (Signed)
PROGRESS NOTE        PATIENT DETAILS Name: Andrew [name] Age: 71 y.o. Sex: male Date of Birth: [birth year] Admit Date: 10/08/2016 Admitting Physician Etta Quill, DO LGX:QJJHER,DEYC HENRY, MD  Brief Narrative: Patient is a 71 y.o. male with recent prolonged hospitalization for MRSA bacteremia felt to be secondary to olecranon bursitis sent home on IV vancomycin, referred back to the hospital on 2/22 by home health RN for worsening right elbow/right arm wounds. He was subsequently admitted for further evaluation and treatment.  Subjective: Lying comfortably in bed. He is anxious to go home  Assessment/Plan: Right arm/elbow necrotizing infection with osteomyelitis: Underwent excisional debridement on 2/23 and 2/25. Wound VAC in place, orthopedics planning on consulting plastic surgery. Per orthopedic op note on 2/25-bone excisional debridement done as well. Intraoperative cultures on 2/23 positive for Pseudomonas and Klebsiella-sensitive to ciprofloxacin. Her ID note, patient will require 6 weeks of ciprofloxacin from 2/23. Plans are to continue intravenous vancomycin while patient is inpatient-and discontinue on discharge. Orthopedics has consulted plastic surgery for further wound care expertise-await plastics evaluation.    Recent history of MRSA bacteremia: Has mild leukocytosis-but afebrile and does not appear toxic. Continue IV vancomycin, blood cultures on 2/23 negative so far. Per last discharge summary-stop date 10/12/16. Infectious disease recommending that we continue vancomycin until discharge from the hospital.  Insulin-dependent type 2 diabetes: Mild hyperglycemia on 2/27-increase Lantus to 14 units and SSI.  Have counseled patient extensively regarding importance of optimal glycemic control in order for wound healing, infection control. We will continue to follow and optimize accordingly.  Anemia: Likely secondary to acute illness-no evidence of  overt blood loss. He required 1 unit of PRBC of transfusion, hemoglobin stable at 8.5. Continue to follow CBC.   Chronic diastolic heart failure: Clinically compensated, follow clinically.  Hypertension: BP well controlled-continue with metoprolol and amlodipine.  Dyslipidemia: Continue statin.  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  History of dysphagia: Per last discharge summary he was started on a dysphagia 2 diet-repeat speech therapy evaluation completed-recommendations are for regular diet.   Tobacco abuse: Continue transdermal nicotine. I do not think he has any inclination to quit at this time.  DVT Prophylaxis: Prophylactic Lovenox   Code Status: Full code   Family Communication: None at bedside  Disposition Plan: Remain inpatient-refusing CIR, SNF. Very poor social condition-probably will only agree to go home with home health services.  Antimicrobial agents: Anti-infectives    Start     Dose/Rate Route Frequency Ordered Stop   10/13/16 1100  ciprofloxacin (CIPRO) tablet 750 mg     750 mg Oral 2 times daily 10/13/16 0934     10/11/16 2200  vancomycin (VANCOCIN) 1,250 mg in sodium chloride 0.9 % 250 mL IVPB     1,250 mg 166.7 mL/hr over 90 Minutes Intravenous Every 12 hours 10/11/16 1358     10/11/16 2200  ceFEPIme (MAXIPIME) 2 g in dextrose 5 % 50 mL IVPB  Status:  Discontinued     2 g 100 mL/hr over 30 Minutes Intravenous Every 8 hours 10/11/16 1405 10/13/16 0934   10/10/16 1400  ceFEPIme (MAXIPIME) 1 g in dextrose 5 % 50 mL IVPB  Status:  Discontinued     1 g 100 mL/hr over 30 Minutes Intravenous Every 8 hours 10/10/16  1140 10/11/16 1405   10/09/16 1715  vancomycin IVPB  Status:  Discontinued    Comments:  Indication: MRSA bacteremia Last Day of Therapy:  10/12/16 Labs - Sunday/Monday:  CBC/D, BMP, and vancomycin trough. Labs - Thursday:  BMP and vancomycin trough Labs - Every other week:  ESR and  CRP     75 0 mg Intravenous Every 12 hours 10/09/16 1701 10/09/16 1707   10/09/16 1200  vancomycin (VANCOCIN) 1,250 mg in sodium chloride 0.9 % 250 mL IVPB  Status:  Discontinued     1,250 mg 166.7 mL/hr over 90 Minutes Intravenous Every 12 hours 10/09/16 1002 10/11/16 1358      Procedures: None  CONSULTS:  orthopedic surgery  Infectious disease Plastic surgery  Time spent: 25- minutes-Greater than 50% of this time was spent in counseling, explanation of diagnosis, planning of further management, and coordination of care.  MEDICATIONS: Scheduled Meds: . amLODipine  10 mg Oral Daily  . aspirin  325 mg Oral Daily  . atorvastatin  20 mg Oral q1800  . Chlorhexidine Gluconate Cloth  6 each Topical Q0600  . ciprofloxacin  750 mg Oral BID  . enoxaparin (LOVENOX) injection  40 mg Subcutaneous Q24H  . feeding supplement (ENSURE ENLIVE)  237 mL Oral Q24H  . gabapentin  100 mg Oral TID  . insulin aspart  0-9 Units Subcutaneous TID WC & HS  . insulin glargine  10 Units Subcutaneous Daily  . metoprolol succinate  100 mg Oral Daily  . nicotine  21 mg Transdermal Daily  . nystatin   Topical TID  . sodium chloride flush  3 mL Intravenous Q12H  . thiamine  100 mg Oral Daily  . vancomycin  1,250 mg Intravenous Q12H   Continuous Infusions:  PRN Meds:.oxyCODONE-acetaminophen, sodium chloride flush   PHYSICAL EXAM: Vital signs: Vitals:   10/12/16 2015 10/13/16 0032 10/13/16 0457 10/13/16 0930  BP: (!) 150/65 (!) 177/94 (!) 169/77   Pulse: 67 76 67 69  Resp: 18 20 20    Temp: 98.7 F (37.1 C)  98.4 F (36.9 C)   TempSrc: Oral  Oral   SpO2: 98% 98% 97%   Weight:      Height:       Filed Weights   10/09/16 0152  Weight: 66.7 kg (147 lb)   Body mass index is 22.35 kg/m.   General appearance :Awake, alert, not in any distress. Speech Clear.  Eyes:, pupils equally reactive to light and accomodation,no scleral icterus. HEENT: Atraumatic and Normocephalic Neck: supple, no JVD. No  cervical lymphadenopathy.  Resp:Good air entry bilaterally CVS: S1 S2 regular GI: Bowel sounds present, Non tender and not distended with no gaurding, rigidity or rebound. Extremities: B/L Lower Ext shows no edema, both legs are warm to touch.Right arm dressing in place-did not open. Neurology:  speech clear,Non focal, sensation is grossly intact. Psychiatric: Normal judgment and insight. Alert and oriented x 3. Normal mood. Musculoskeletal:No digital cyanosis Skin:No Rash, warm and dry Wounds:N/A  I have personally reviewed following labs and imaging studies  LABORATORY DATA: CBC:  Recent Labs Lab 10/09/16 0049 10/10/16 0420 10/11/16 1139 10/12/16 0450  WBC 14.1* 11.1* 11.7* 11.8*  NEUTROABS 8.7*  --   --   --   HGB 11.0* 7.9* 7.7* 8.5*  HCT 34.1* 24.5* 23.7* 25.7*  MCV 88.3 88.4 87.8 88.6  PLT 415* 379 397 332    Basic Metabolic Panel:  Recent Labs Lab 10/09/16 0049 10/10/16 0420 10/11/16 1139 10/12/16 0450  NA 137 134*  136 136  K 4.1 4.0 3.9 4.1  CL 101 98* 103 101  CO2 26 28 27 29   GLUCOSE 166* 189* 231* 127*  BUN 9 12 11 10   CREATININE 0.53* 0.55* 0.55* 0.52*  CALCIUM 9.1 8.2* 8.2* 8.5*    GFR: Estimated Creatinine Clearance: 79.9 mL/min (by C-G formula based on SCr of 0.52 mg/dL (L)).  Liver Function Tests: No results for input(s): AST, ALT, ALKPHOS, BILITOT, PROT, ALBUMIN in the last 168 hours. No results for input(s): LIPASE, AMYLASE in the last 168 hours. No results for input(s): AMMONIA in the last 168 hours.  Coagulation Profile: No results for input(s): INR, PROTIME in the last 168 hours.  Cardiac Enzymes: No results for input(s): CKTOTAL, CKMB, CKMBINDEX, TROPONINI in the last 168 hours.  BNP (last 3 results) No results for input(s): PROBNP in the last 8760 hours.  HbA1C: No results for input(s): HGBA1C in the last 72 hours.  CBG:  Recent Labs Lab 10/12/16 1203 10/12/16 1607 10/12/16 2013 10/13/16 0622 10/13/16 0849  GLUCAP  161* 206* 173* 272* 290*    Lipid Profile: No results for input(s): CHOL, HDL, LDLCALC, TRIG, CHOLHDL, LDLDIRECT in the last 72 hours.  Thyroid Function Tests: No results for input(s): TSH, T4TOTAL, FREET4, T3FREE, THYROIDAB in the last 72 hours.  Anemia Panel: No results for input(s): VITAMINB12, FOLATE, FERRITIN, TIBC, IRON, RETICCTPCT in the last 72 hours.  Urine analysis:    Component Value Date/Time   COLORURINE YELLOW 08/30/2016 1926   APPEARANCEUR HAZY (A) 08/30/2016 1926   LABSPEC 1.020 08/30/2016 1926   PHURINE 5.0 08/30/2016 1926   GLUCOSEU >=500 (A) 08/30/2016 1926   HGBUR MODERATE (A) 08/30/2016 1926   BILIRUBINUR NEGATIVE 08/30/2016 1926   KETONESUR 20 (A) 08/30/2016 1926   PROTEINUR 100 (A) 08/30/2016 1926   UROBILINOGEN 0.2 04/18/2015 0210   NITRITE NEGATIVE 08/30/2016 1926   LEUKOCYTESUR NEGATIVE 08/30/2016 1926    Sepsis Labs: Lactic Acid, Venous    Component Value Date/Time   LATICACIDVEN 0.9 09/08/2016 1453    MICROBIOLOGY: Recent Results (from the past 240 hour(s))  Culture, blood (routine x 2)     Status: None (Preliminary result)   Collection Time: 10/09/16 12:55 AM  Result Value Ref Range Status   Specimen Description BLOOD RIGHT HAND  Final   Special Requests IN PEDIATRIC BOTTLE 3ML  Final   Culture NO GROWTH 3 DAYS  Final   Report Status PENDING  Incomplete  Culture, blood (routine x 2)     Status: None (Preliminary result)   Collection Time: 10/09/16  6:08 AM  Result Value Ref Range Status   Specimen Description BLOOD RIGHT HAND  Final   Special Requests BOTTLES DRAWN AEROBIC AND ANAEROBIC  5CC  Final   Culture NO GROWTH 3 DAYS  Final   Report Status PENDING  Incomplete  MRSA PCR Screening     Status: None   Collection Time: 10/09/16  9:48 AM  Result Value Ref Range Status   MRSA by PCR NEGATIVE NEGATIVE Final    Comment:        The GeneXpert MRSA Assay (FDA approved for NASAL specimens only), is one component of a comprehensive  MRSA colonization surveillance program. It is not intended to diagnose MRSA infection nor to guide or monitor treatment for MRSA infections.   Aerobic/Anaerobic Culture (surgical/deep wound)     Status: None (Preliminary result)   Collection Time: 10/09/16  2:10 PM  Result Value Ref Range Status   Specimen Description TISSUE RIGHT  ARM  Final   Special Requests PATIENT ON FOLLOWING VANC  Final   Gram Stain   Final    MODERATE WBC PRESENT, PREDOMINANTLY PMN NO ORGANISMS SEEN    Culture   Final    RARE PSEUDOMONAS AERUGINOSA RARE KLEBSIELLA OXYTOCA CRITICAL RESULT CALLED TO, READ BACK BY AND VERIFIED WITH: T PHILLIP,RN AT 1036 10/10/16 BY L BENFIELD CONCERNING GROWTH ON CULTURE NO ANAEROBES ISOLATED; CULTURE IN PROGRESS FOR 5 DAYS    Report Status PENDING  Incomplete   Organism ID, Bacteria PSEUDOMONAS AERUGINOSA  Final   Organism ID, Bacteria KLEBSIELLA OXYTOCA  Final      Susceptibility   Klebsiella oxytoca - MIC*    AMPICILLIN >=32 RESISTANT Resistant     CEFAZOLIN >=64 RESISTANT Resistant     CEFEPIME <=1 SENSITIVE Sensitive     CEFTAZIDIME <=1 SENSITIVE Sensitive     CEFTRIAXONE <=1 SENSITIVE Sensitive     CIPROFLOXACIN <=0.25 SENSITIVE Sensitive     GENTAMICIN <=1 SENSITIVE Sensitive     IMIPENEM <=0.25 SENSITIVE Sensitive     TRIMETH/SULFA <=20 SENSITIVE Sensitive     AMPICILLIN/SULBACTAM 16 INTERMEDIATE Intermediate     PIP/TAZO 16 SENSITIVE Sensitive     Extended ESBL NEGATIVE Sensitive     * RARE KLEBSIELLA OXYTOCA   Pseudomonas aeruginosa - MIC*    CEFTAZIDIME 2 SENSITIVE Sensitive     CIPROFLOXACIN <=0.25 SENSITIVE Sensitive     GENTAMICIN <=1 SENSITIVE Sensitive     IMIPENEM 2 SENSITIVE Sensitive     PIP/TAZO 8 SENSITIVE Sensitive     CEFEPIME <=1 SENSITIVE Sensitive     * RARE PSEUDOMONAS AERUGINOSA  Acid Fast Smear (AFB)     Status: None   Collection Time: 10/09/16  2:10 PM  Result Value Ref Range Status   AFB Specimen Processing Comment  Final     Comment: Tissue Grinding and Digestion/Decontamination   Acid Fast Smear Negative  Final    Comment: (NOTE) Performed At: [facility], [place] [id number] Lindon Romp MD SU:[id number]    Source (AFB) TISSUE  Final    Comment: RIGHT ARM     RADIOLOGY STUDIES/RESULTS: Dg Ribs Unilateral W/chest Right  Result Date: 10/09/2016 CLINICAL DATA:  Right-sided rib pain EXAM: RIGHT RIBS AND CHEST - 3+ VIEW COMPARISON:  Chest radiograph 09/10/2016 FINDINGS: There is a left-sided approach PICC line with tip at the cavoatrial junction. Unchanged cardiomegaly with atherosclerotic calcification in the aortic arch. No pneumothorax or sizable pleural effusion. Diffusely increased pulmonary markings. No focal consolidation. No rib fracture is identified. IMPRESSION: 1. No rib fracture. 2. Cardiomegaly and aortic atherosclerosis. Electronically Signed   By: Ulyses Jarred M.D.   On: 10/09/2016 01:01   Dg Elbow 2 Views Right  Result Date: 10/09/2016 CLINICAL DATA:  Right elbow pain for 1 month.  Multiple ulcers. EXAM: RIGHT ELBOW - 2 VIEW COMPARISON:  CT from earlier today FINDINGS: Extensive soft tissue gas and swelling in the arm. Extent is greater than seen on this exam, but was covered on prior CT. No visible progression from prior. There are multiple open wounds per report. A large ulcer noted over olecranon spurring. Findings suggest necrotizing infection. Osteopenia. No visible infectious bony erosion. Possible joint effusion, limited by overlapping gas bubbles. Patient has already been seen by surgery and there are plans for OR debridement. IMPRESSION: 1. Diffuse soft tissue gas and swelling in the arm as seen with necrotizing infection. No visible progression compared to preceding CT. 2. Large ulcer  over an olecranon enthesophyte, possible exposed bone. Electronically Signed   By: Monte Fantasia M.D.   On: 10/09/2016 09:57   Ct Humerus Right W Contrast  Result Date:  10/09/2016 CLINICAL DATA:  71 year old male with right arm wounds with current drainage and surrounding erythema. EXAM: CT OF THE UPPER RIGHT EXTREMITY WITH CONTRAST TECHNIQUE: Multidetector CT imaging of the upper right extremity was performed according to the standard protocol following intravenous contrast administration. COMPARISON:  CT of the right elbow dated 09/09/2016 CONTRAST:  100 cc Isovue-300 FINDINGS: Bones/Joint/Cartilage The bones are osteopenic. No acute fracture for dislocation. Old healed right clavicular fracture noted. No periosteal elevation or erosive changes of the bone to suggest osteomyelitis. Evaluation however is limited due to osteopenia. MRI or a white blood cell nuclear scan may provide better evaluation if there is high clinical concern for osteomyelitis. Ligaments Suboptimally assessed by CT. Muscles and Tendons No acute findings.  No intramuscular hematoma. Soft tissues There is extensive diffuse subcutaneous edema. There is skin defect with open wound in the medial aspect of the distal arm. A second open wound noted in the dorsal aspect distal on approximately 7 cm above the elbow. Additional open wounds likely present but outside of the field of view. Subcutaneous air communicates with skin defect and extends superficial to the muscular fascia. There has been significant interval increase in the amount of soft tissue air compared to the prior CT. No discrete drainable fluid collection or abscess identified. IMPRESSION: 1. Open skin wounds with extensive amount of soft tissue air dissecting through the subcutaneous fat superficial to the muscle fascia. There has been interval increase in the size of the soft tissue air compared to the prior CT. 2. No definite evidence of osteomyelitis by CT. MRI or a WBC nuclear scan may provide better evaluation if there is high clinical concern for osteomyelitis. 3. Diffuse subcutaneous soft tissue edema. No discrete drainable fluid collection or  abscess identified. Electronically Signed   By: Anner Crete M.D.   On: 10/09/2016 03:07     LOS: 4 days   Kyra Leyland, PA-S  If 7PM-7AM, please contact night-coverage [email or web address] Password [facility] 10/13/2016, 9:46 AM  Attending MD note Patient was seen, examined,treatment plan was discussed with the PA-S.  I have personally reviewed the clinical findings, lab, imaging studies and management of this patient in detail. I agree with the documentation, as recorded by the PA-S.   Patient anxious to be discharged-but knows that he needs to stay in the hospital until plastic surgery evaluation/interventions have been completed. Lying comfortably in the bed.  On Exam: Gen. exam: Awake, alert, not in any distress Chest: Good air entry bilaterally, no rhonchi or rales CVS: S1-S2 regular, no murmurs Abdomen: Soft, nontender and nondistended Neurology: Non-focal Skin: No rash or lesions  Depression: Necrotizing right arm infection with probable underlying osteomyelitis-wound cultures positive for Klebsiella and Pseudomonas Recent MSSA bacteremia-repeat blood cultures December admission negative Diabetes-better controlled Hypertension  Plan Continue empiric vancomycin and ciprofloxacin Await plastic surgery evaluation CBGs elevated this morning-have increased Lantus to 14 units Patient not agreeable to SNF for CIR-very poor social status-he is only interested in going home with home health services.  Rest as above  Nena Alexander MD

## 2016-10-13 NOTE — Progress Notes (Signed)
Nurse heard noise coming from patient room and ran towards room. Patient observed in a siting position. Denies pain. Patient spilled water on the floor on the process. Patient alert and oriented. No previous confusion noted.Answered questions appropriately. MD on call notified. Bed alarm on and in low position. Family will be notified.
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
## 2016-10-13 NOTE — Progress Notes (Signed)
[facility] for Infectious Disease    Date of Admission:  10/08/2016   Total days of antibiotics 5        Day 5 vancomycin        Day 1 cipro           ID: Andrew [name] is a 71 y.o. male with GNR Septic olecranon bursitis of right elbow in setting of MRSA bacteremia Principal Problem:   Septic olecranon bursitis of right elbow Active Problems:   Hypertension   Bacteremia due to methicillin resistant Staphylococcus aureus   Pressure injury of skin    Subjective: Afebrile.  Still has wound vac in place  Medications:  . amLODipine  10 mg Oral Daily  . aspirin  325 mg Oral Daily  . atorvastatin  20 mg Oral q1800  . Chlorhexidine Gluconate Cloth  6 each Topical Q0600  . ciprofloxacin  750 mg Oral BID  . enoxaparin (LOVENOX) injection  40 mg Subcutaneous Q24H  . feeding supplement (ENSURE ENLIVE)  237 mL Oral Q24H  . gabapentin  100 mg Oral TID  . insulin aspart  0-9 Units Subcutaneous TID WC & HS  . [START ON 10/14/2016] insulin glargine  14 Units Subcutaneous Daily  . metoprolol succinate  100 mg Oral Daily  . nicotine  21 mg Transdermal Daily  . nystatin   Topical TID  . sodium chloride flush  3 mL Intravenous Q12H  . thiamine  100 mg Oral Daily  . vancomycin  1,250 mg Intravenous Q12H    Objective: Vital signs in last 24 hours: Temp:  [98.3 F (36.8 C)-98.9 F (37.2 C)] 98.3 F (36.8 C) (02/27 1408) Pulse Rate:  [65-76] 65 (02/27 1408) Resp:  [18-20] 18 (02/27 1408) BP: (133-177)/(46-94) 135/55 (02/27 1408) SpO2:  [97 %-100 %] 98 % (02/27 1408) Physical Exam  Constitutional: He is oriented to person, place, and time. He appears disheveled, older than stated age No distress.  HENT:  Mouth/Throat: Oropharynx is clear and moist. No oropharyngeal exudate.  Cardiovascular: Normal rate, regular rhythm and normal heart sounds. Exam reveals no gallop and no friction rub.  No murmur heard.  Pulmonary/Chest: Effort normal and breath sounds normal. No respiratory  distress. He has no wheezes.  Abdominal: Soft. Bowel sounds are normal. He exhibits no distension. There is no tenderness.  Ext: right elbow is wrapped with wound vac Skin: Skin is warm and dry. No rash noted. No erythema. Left arm picc line is c/d/i Psychiatric: He has a normal mood and affect. His behavior is normal.   Lab Results  Recent Labs  10/11/16 1139 10/12/16 0450  WBC 11.7* 11.8*  HGB 7.7* 8.5*  HCT 23.7* 25.7*  NA 136 136  K 3.9 4.1  CL 103 101  CO2 27 29  BUN 11 10  CREATININE 0.55* 0.52*   . Lab Results  Component Value Date   ESRSEDRATE 48 (H) 10/09/2016   Lab Results  Component Value Date   CRP <0.8 10/09/2016    Microbiology: 2/23 tissue cx - kleb oxytoca and PsA 2/23 blood cx ngtd Studies/Results: No results found.   Assessment/Plan:  Septic olecranon bursitis of right elbow =  Plan to treat with cipro for 6 wk. Currently on day 3 of 28. Stop vancomycin at time of discharge  Will sign off.   We will have him come back to clinic for follow up  [facility], [facility] for Infectious Diseases Cell: [phone] Pager: [phone]  10/13/2016, 2:35 PM

## 2016-10-13 NOTE — Progress Notes (Signed)
Patient son Andrew [name] called with update about patient's fall. Patient did not want family notified. "I am not a baby, you don't need to call anyone", Patient said.

## 2016-10-14 ENCOUNTER — Ambulatory Visit: Payer: Self-pay | Admitting: Plastic Surgery

## 2016-10-14 ENCOUNTER — Encounter (HOSPITAL_COMMUNITY): Payer: Self-pay | Admitting: Anesthesiology

## 2016-10-14 ENCOUNTER — Encounter (HOSPITAL_COMMUNITY)
Admission: EM | Disposition: A | Discharge status: Home / Self Care | Payer: Self-pay | Source: Home / Self Care | Attending: Internal Medicine

## 2016-10-14 DIAGNOSIS — L089 Local infection of the skin and subcutaneous tissue, unspecified: Secondary | ICD-10-CM

## 2016-10-14 DIAGNOSIS — S41101A Unspecified open wound of right upper arm, initial encounter: Secondary | ICD-10-CM

## 2016-10-14 DIAGNOSIS — A499 Bacterial infection, unspecified: Secondary | ICD-10-CM

## 2016-10-14 LAB — CULTURE, BLOOD (ROUTINE X 2)
Culture: NO GROWTH
Culture: NO GROWTH

## 2016-10-14 LAB — GLUCOSE, CAPILLARY
Glucose-Capillary: 294 mg/dL — ABNORMAL HIGH (ref 65–99)
Glucose-Capillary: 326 mg/dL — ABNORMAL HIGH (ref 65–99)
Glucose-Capillary: 189 mg/dL — ABNORMAL HIGH (ref 65–99)
Glucose-Capillary: 229 mg/dL — ABNORMAL HIGH (ref 65–99)

## 2016-10-14 LAB — AEROBIC/ANAEROBIC CULTURE (SURGICAL/DEEP WOUND)

## 2016-10-14 LAB — AEROBIC/ANAEROBIC CULTURE W GRAM STAIN (SURGICAL/DEEP WOUND)

## 2016-10-14 SURGERY — IRRIGATION AND DEBRIDEMENT WOUND
Anesthesia: General | Site: Elbow | Laterality: Right

## 2016-10-14 MED ORDER — CHLORHEXIDINE GLUCONATE CLOTH 2 % EX PADS
6.0000 | MEDICATED_PAD | Freq: Once | CUTANEOUS | Status: AC
  Administered 2016-10-14: 6 via TOPICAL

## 2016-10-14 MED ORDER — INSULIN GLARGINE 100 UNIT/ML ~~LOC~~ SOLN
16.0000 [IU] | Freq: Every day | SUBCUTANEOUS | Status: DC
  Administered 2016-10-15: 16 [IU] via SUBCUTANEOUS
  Filled 2016-10-14: qty 0.16

## 2016-10-14 MED ORDER — INSULIN ASPART 100 UNIT/ML ~~LOC~~ SOLN
4.0000 [IU] | Freq: Three times a day (TID) | SUBCUTANEOUS | Status: DC
  Administered 2016-10-14 – 2016-10-15 (×2): 4 [IU] via SUBCUTANEOUS

## 2016-10-14 MED ORDER — CHLORHEXIDINE GLUCONATE CLOTH 2 % EX PADS: 6.0000 | MEDICATED_PAD | Freq: Once | CUTANEOUS | Status: AC

## 2016-10-14 NOTE — Progress Notes (Signed)
Physical Therapy Treatment Patient Details Name: Andrew [name] MRN: [id number] DOB: 03/10/1946 Today's Date: 10/14/2016    History of Present Illness Patient is a 71 y.o. male with hypertension, hyperlipidemia, diabetes mellitus, anxiety, AAA 4.8 cm on MRI (04/09/15), right eye blindness, and dCHF and recent prolonged hospitalization for MRSA bacteremia felt to be secondary to olecranon bursitis sent home on IV vancomycin, referred back to the hospital on 2/22 by home health RN for worsening right elbow/right arm wounds. now s/p I&D R elbow including bone excisement with placement of wound vac.    PT Comments    Pt refusing to attempt activity beyond sitting EOB during PT session. He demonstrates poor insight into his limitations. Encouraging activity but pt refusing to attempt standing, transfers, or ambulation. Pt states that he is planning to return home following the hospital stay with his son helping him. No family present to discuss this with. Based upon the patient's current mobility level, recommending SNF following acute stay.    Follow Up Recommendations  SNF;Supervision/Assistance - 24 hour     Equipment Recommendations   (to be determined)    Recommendations for Other Services       Precautions / Restrictions Precautions Precautions: Fall Precaution Comments: wound vac R elbow Required Braces or Orthoses: Sling (R UE) Restrictions Weight Bearing Restrictions: Yes RUE Weight Bearing: Non weight bearing    Mobility  Bed Mobility Overal bed mobility: Needs Assistance Bed Mobility: Rolling Rolling: Modified independent (Device/Increase time) (using rail)   Supine to sit: Supervision Sit to supine: Supervision (supervision for safety and cues to not use RT UE. )   General bed mobility comments: Pt disregarding cues to not use Rt UE for bed mobility.   Transfers                 General transfer comment: Pt refusing to attempt.  Ambulation/Gait                 Stairs            Wheelchair Mobility    Modified Rankin (Stroke Patients Only)       Balance Overall balance assessment: Needs assistance Sitting-balance support: No upper extremity supported;Feet supported Sitting balance-Leahy Scale: Fair Sitting balance - Comments: Pt able to sit EOB with supervision for safety.                             Cognition Arousal/Alertness: Awake/alert Behavior During Therapy: Agitated Overall Cognitive Status: No family/caregiver present to determine baseline cognitive functioning Area of Impairment: Safety/judgement;Problem solving         Safety/Judgement: Decreased awareness of safety   Problem Solving: Slow processing General Comments: Pt with poor awareness of limitations and functional abilities. Pt states that he can walk if he wants but just can't right now. Pt planning to have his son carry him into his home and lift him onto the toilet if needed.     Exercises      General Comments        Pertinent Vitals/Pain Pain Assessment: No/denies pain    Home Living                      Prior Function            PT Goals (current goals can now be found in the care plan section) Acute Rehab PT Goals Patient Stated Goal: not expressed PT Goal Formulation: With  patient Time For Goal Achievement: 10/25/16 Potential to Achieve Goals: Fair Progress towards PT goals: Not progressing toward goals - comment (Pt refusing mobility progression. )    Frequency    Min 3X/week      PT Plan Current plan remains appropriate    Co-evaluation             End of Session   Activity Tolerance: Treatment limited secondary to agitation Patient left: in bed;with call bell/phone within reach;with bed alarm set   PT Visit Diagnosis: Pain;Unsteadiness on feet (R26.81);Other abnormalities of gait and mobility (R26.89);Muscle weakness (generalized) (M62.81);History of falling (Z91.81) Pain -  Right/Left: Right Pain - part of body: Arm     Time: [id number] PT Time Calculation (min) (ACUTE ONLY): 15 min  Charges:  $Therapeutic Activity: 8-22 mins                    G Codes:       Cassell Clement, PT, CSCS Pager [phone] Office [phone]  10/14/2016, 12:24 PM

## 2016-10-14 NOTE — Progress Notes (Signed)
Inpatient Diabetes Program Recommendations  AACE/ADA: New Consensus Statement on Inpatient Glycemic Control (2015)  Target Ranges:  Prepandial:   less than 140 mg/dL      Peak postprandial:   less than 180 mg/dL (1-2 hours)      Critically ill patients:  140 - 180 mg/dL   Lab Results  Component Value Date   GLUCAP 326 (H) 10/14/2016   HGBA1C >15.5 (H) 08/31/2016    Review of Glycemic Control:  Results for Andrew [name], Andrew [name] (MRN [id number]) as of 10/14/2016 12:08  Ref. Range 10/13/2016 06:22 10/13/2016 08:49 10/13/2016 11:18 10/13/2016 16:08 10/13/2016 21:11 10/14/2016 06:14 10/14/2016 12:03  Glucose-Capillary Latest Ref Range: 65 - 99 mg/dL 272 (H) 290 (H) 306 (H) 87 253 (H) 294 (H) 326 (H)   Diabetes history: Type 2 diabetes Outpatient Diabetes medications: Lantus 7 units q HS, Novolog 1-9 units tid with meals Current orders for Inpatient glycemic control:  Lantus 14 units daily, Novolog sensitive tid with meals  Inpatient Diabetes Program Recommendations:   May consider adding Novolog 3 units tid with meals.   Thanks, Adah Perl, RN, BC-ADM Inpatient Diabetes Coordinator Pager [phone] (8a-5p)

## 2016-10-14 NOTE — Progress Notes (Addendum)
PROGRESS NOTE        PATIENT DETAILS Name: Andrew [name] Age: 71 y.o. Sex: male Date of Birth: 1946/03/22 Admit Date: 10/08/2016 Admitting Physician Etta Quill, DO OKH:[id number],SELT HENRY, MD  Brief Narrative: Patient is a 71 y.o. male with recent prolonged hospitalization for MRSA bacteremia felt to be secondary to olecranon bursitis sent home on IV vancomycin, referred back to the hospital on 2/22 by home health RN for worsening right elbow/right arm wounds. He was subsequently admitted for further evaluation and treatment.  Subjective: Lying comfortably in bed. He is anxious to go home  Assessment/Plan: Right arm/elbow necrotizing infection: Underwent excisional debridement on 2/23 and 2/25. Wound VAC in place, orthopedics planning on consulting plastic surgery. Per orthopedic op note on 2/25-bone excisional debridement done as well. Intraoperative cultures on 2/23 positive for Pseudomonas and Klebsiella-sensitive to ciprofloxacin. Her ID note, patient will require 6 weeks of ciprofloxacin from 2/23. Plans are to continue intravenous vancomycin while patient is inpatient-and discontinue on discharge. Orthopedics has signed off, plastic surgery now following with plans for OR on 3/1    Recent history of MRSA bacteremia: Has mild leukocytosis-but afebrile and does not appear toxic. Continue IV vancomycin, blood cultures on 2/23 negative so far. Per last discharge summary-stop date 10/12/16. Infectious disease recommending that we continue vancomycin until discharge from the hospital.  Insulin-dependent type 2 diabetes: CBGs continue to fluctuate-increase Lantus to 16 units, add 4 units of NovoLog with meals. Continue SSI.   Have counseled patient extensively regarding importance of optimal glycemic control in order for wound healing, infection control. We will continue to follow and optimize accordingly.  Anemia: Likely secondary to acute illness-no evidence of  overt blood loss. He required 1 unit of PRBC of transfusion, hemoglobin stable at 8.5. Continue to follow CBC.   Chronic diastolic heart failure: Clinically compensated, follow clinically.  Hypertension: BP well controlled-continue with metoprolol and amlodipine.  Dyslipidemia: Continue statin.  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  History of dysphagia: Per last discharge summary he was started on a dysphagia 2 diet-repeat speech therapy evaluation completed-recommendations are for regular diet.   Tobacco abuse: Continue transdermal nicotine. I do not think he has any inclination to quit at this time.  DVT Prophylaxis: Prophylactic Lovenox   Code Status: Full code   Family Communication: None at bedside  Disposition Plan: Remain inpatient-refusing CIR, SNF. Very poor social condition-probably will only agree to go home with home health services.  Antimicrobial agents: Anti-infectives    Start     Dose/Rate Route Frequency Ordered Stop   10/13/16 1100  ciprofloxacin (CIPRO) tablet 750 mg     750 mg Oral 2 times daily 10/13/16 0934     10/11/16 2200  vancomycin (VANCOCIN) 1,250 mg in sodium chloride 0.9 % 250 mL IVPB     1,250 mg 166.7 mL/hr over 90 Minutes Intravenous Every 12 hours 10/11/16 1358     10/11/16 2200  ceFEPIme (MAXIPIME) 2 g in dextrose 5 % 50 mL IVPB  Status:  Discontinued     2 g 100 mL/hr over 30 Minutes Intravenous Every 8 hours 10/11/16 1405 10/13/16 0934   10/10/16 1400  ceFEPIme (MAXIPIME) 1 g in dextrose 5 % 50 mL IVPB  Status:  Discontinued     1 g 100 mL/hr  over 30 Minutes Intravenous Every 8 hours 10/10/16 1140 10/11/16 1405   10/09/16 1715  vancomycin IVPB  Status:  Discontinued    Comments:  Indication: MRSA bacteremia Last Day of Therapy:  10/12/16 Labs - Sunday/Monday:  CBC/D, BMP, and vancomycin trough. Labs - Thursday:  BMP and vancomycin trough Labs - Every other week:  ESR and  CRP     75 0 mg Intravenous Every 12 hours 10/09/16 1701 10/09/16 1707   10/09/16 1200  vancomycin (VANCOCIN) 1,250 mg in sodium chloride 0.9 % 250 mL IVPB  Status:  Discontinued     1,250 mg 166.7 mL/hr over 90 Minutes Intravenous Every 12 hours 10/09/16 1002 10/11/16 1358      Procedures: None  CONSULTS:  orthopedic surgery  Infectious disease Plastic surgery  Time spent: 25- minutes-Greater than 50% of this time was spent in counseling, explanation of diagnosis, planning of further management, and coordination of care.  MEDICATIONS: Scheduled Meds: . amLODipine  10 mg Oral Daily  . aspirin  325 mg Oral Daily  . atorvastatin  20 mg Oral q1800  . Chlorhexidine Gluconate Cloth  6 each Topical Q0600  . ciprofloxacin  750 mg Oral BID  . enoxaparin (LOVENOX) injection  40 mg Subcutaneous Q24H  . feeding supplement (ENSURE ENLIVE)  237 mL Oral Q24H  . gabapentin  100 mg Oral TID  . insulin aspart  0-9 Units Subcutaneous TID WC & HS  . insulin aspart  4 Units Subcutaneous TID WC  . [START ON 10/15/2016] insulin glargine  16 Units Subcutaneous Daily  . metoprolol succinate  100 mg Oral Daily  . nicotine  21 mg Transdermal Daily  . nystatin   Topical TID  . sodium chloride flush  3 mL Intravenous Q12H  . thiamine  100 mg Oral Daily  . vancomycin  1,250 mg Intravenous Q12H   Continuous Infusions:  PRN Meds:.oxyCODONE-acetaminophen, sodium chloride flush   PHYSICAL EXAM: Vital signs: Vitals:   10/13/16 1408 10/13/16 2022 10/14/16 0525 10/14/16 1254  BP: (!) 135/55 (!) 150/59 (!) 124/56 137/74  Pulse: 65 69 68 66  Resp: 18 18 18 17   Temp: 98.3 F (36.8 C) 98.1 F (36.7 C) 99.6 F (37.6 C) 99 F (37.2 C)  TempSrc: Oral Oral Oral Oral  SpO2: 98% 97% 98% 95%  Weight:      Height:       Filed Weights   10/09/16 0152  Weight: 66.7 kg (147 lb)   Body mass index is 22.35 kg/m.   General appearance :Awake, alert, not in any distress. Speech Clear.  Eyes:, pupils  equally reactive to light and accomodation,no scleral icterus. HEENT: Atraumatic and Normocephalic Neck: supple, no JVD. No cervical lymphadenopathy.  Resp:Good air entry bilaterally CVS: S1 S2 regular GI: Bowel sounds present, Non tender and not distended with no gaurding, rigidity or rebound. Extremities: B/L Lower Ext shows no edema, both legs are warm to touch.Right arm dressing in place-did not open. Neurology:  speech clear,Non focal, sensation is grossly intact. Psychiatric: Normal judgment and insight. Alert and oriented x 3. Normal mood. Musculoskeletal:No digital cyanosis Skin:No Rash, warm and dry Wounds:N/A  I have personally reviewed following labs and imaging studies  LABORATORY DATA: CBC:  Recent Labs Lab 10/09/16 0049 10/10/16 0420 10/11/16 1139 10/12/16 0450  WBC 14.1* 11.1* 11.7* 11.8*  NEUTROABS 8.7*  --   --   --   HGB 11.0* 7.9* 7.7* 8.5*  HCT 34.1* 24.5* 23.7* 25.7*  MCV 88.3 88.4 87.8 88.6  PLT 415* 379 397 909    Basic Metabolic Panel:  Recent Labs Lab 10/09/16 0049 10/10/16 0420 10/11/16 1139 10/12/16 0450  NA 137 134* 136 136  K 4.1 4.0 3.9 4.1  CL 101 98* 103 101  CO2 26 28 27 29   GLUCOSE 166* 189* 231* 127*  BUN 9 12 11 10   CREATININE 0.53* 0.55* 0.55* 0.52*  CALCIUM 9.1 8.2* 8.2* 8.5*    GFR: Estimated Creatinine Clearance: 79.9 mL/min (by C-G formula based on SCr of 0.52 mg/dL (L)).  Liver Function Tests: No results for input(s): AST, ALT, ALKPHOS, BILITOT, PROT, ALBUMIN in the last 168 hours. No results for input(s): LIPASE, AMYLASE in the last 168 hours. No results for input(s): AMMONIA in the last 168 hours.  Coagulation Profile: No results for input(s): INR, PROTIME in the last 168 hours.  Cardiac Enzymes: No results for input(s): CKTOTAL, CKMB, CKMBINDEX, TROPONINI in the last 168 hours.  BNP (last 3 results) No results for input(s): PROBNP in the last 8760 hours.  HbA1C: No results for input(s): HGBA1C in the last  72 hours.  CBG:  Recent Labs Lab 10/13/16 1118 10/13/16 1608 10/13/16 2111 10/14/16 0614 10/14/16 1203  GLUCAP 306* 87 253* 294* 326*    Lipid Profile: No results for input(s): CHOL, HDL, LDLCALC, TRIG, CHOLHDL, LDLDIRECT in the last 72 hours.  Thyroid Function Tests: No results for input(s): TSH, T4TOTAL, FREET4, T3FREE, THYROIDAB in the last 72 hours.  Anemia Panel: No results for input(s): VITAMINB12, FOLATE, FERRITIN, TIBC, IRON, RETICCTPCT in the last 72 hours.  Urine analysis:    Component Value Date/Time   COLORURINE YELLOW 08/30/2016 1926   APPEARANCEUR HAZY (A) 08/30/2016 1926   LABSPEC 1.020 08/30/2016 1926   PHURINE 5.0 08/30/2016 1926   GLUCOSEU >=500 (A) 08/30/2016 1926   HGBUR MODERATE (A) 08/30/2016 1926   BILIRUBINUR NEGATIVE 08/30/2016 1926   KETONESUR 20 (A) 08/30/2016 1926   PROTEINUR 100 (A) 08/30/2016 1926   UROBILINOGEN 0.2 04/18/2015 0210   NITRITE NEGATIVE 08/30/2016 1926   LEUKOCYTESUR NEGATIVE 08/30/2016 1926    Sepsis Labs: Lactic Acid, Venous    Component Value Date/Time   LATICACIDVEN 0.9 09/08/2016 1453    MICROBIOLOGY: Recent Results (from the past 240 hour(s))  Culture, blood (routine x 2)     Status: None (Preliminary result)   Collection Time: 10/09/16 12:55 AM  Result Value Ref Range Status   Specimen Description BLOOD RIGHT HAND  Final   Special Requests IN PEDIATRIC BOTTLE 3ML  Final   Culture NO GROWTH 4 DAYS  Final   Report Status PENDING  Incomplete  Culture, blood (routine x 2)     Status: None (Preliminary result)   Collection Time: 10/09/16  6:08 AM  Result Value Ref Range Status   Specimen Description BLOOD RIGHT HAND  Final   Special Requests BOTTLES DRAWN AEROBIC AND ANAEROBIC  5CC  Final   Culture NO GROWTH 4 DAYS  Final   Report Status PENDING  Incomplete  MRSA PCR Screening     Status: None   Collection Time: 10/09/16  9:48 AM  Result Value Ref Range Status   MRSA by PCR NEGATIVE NEGATIVE Final     Comment:        The GeneXpert MRSA Assay (FDA approved for NASAL specimens only), is one component of a comprehensive MRSA colonization surveillance program. It is not intended to diagnose MRSA infection nor to guide or monitor treatment for MRSA infections.   Fungus Culture With Stain  Status: None (Preliminary result)   Collection Time: 10/09/16  2:10 PM  Result Value Ref Range Status   Fungus Stain Final report  Final    Comment: (NOTE) Performed At: [facility] [place], [place] [id number] Lindon Romp MD OI:[id number]    Fungus (Mycology) Culture PENDING  Incomplete   Fungal Source TISSUE  Final    Comment: RIGHT ARM   Aerobic/Anaerobic Culture (surgical/deep wound)     Status: None   Collection Time: 10/09/16  2:10 PM  Result Value Ref Range Status   Specimen Description TISSUE RIGHT ARM  Final   Special Requests PATIENT ON FOLLOWING VANC  Final   Gram Stain   Final    MODERATE WBC PRESENT, PREDOMINANTLY PMN NO ORGANISMS SEEN    Culture   Final    RARE PSEUDOMONAS AERUGINOSA RARE KLEBSIELLA OXYTOCA CRITICAL RESULT CALLED TO, READ BACK BY AND VERIFIED WITH: T PHILLIP,RN AT 1036 10/10/16 BY L BENFIELD CONCERNING GROWTH ON CULTURE NO ANAEROBES ISOLATED    Report Status 10/14/2016 FINAL  Final   Organism ID, Bacteria PSEUDOMONAS AERUGINOSA  Final   Organism ID, Bacteria KLEBSIELLA OXYTOCA  Final      Susceptibility   Klebsiella oxytoca - MIC*    AMPICILLIN >=32 RESISTANT Resistant     CEFAZOLIN >=64 RESISTANT Resistant     CEFEPIME <=1 SENSITIVE Sensitive     CEFTAZIDIME <=1 SENSITIVE Sensitive     CEFTRIAXONE <=1 SENSITIVE Sensitive     CIPROFLOXACIN <=0.25 SENSITIVE Sensitive     GENTAMICIN <=1 SENSITIVE Sensitive     IMIPENEM <=0.25 SENSITIVE Sensitive     TRIMETH/SULFA <=20 SENSITIVE Sensitive     AMPICILLIN/SULBACTAM 16 INTERMEDIATE Intermediate     PIP/TAZO 16 SENSITIVE Sensitive     Extended ESBL NEGATIVE Sensitive     *  RARE KLEBSIELLA OXYTOCA   Pseudomonas aeruginosa - MIC*    CEFTAZIDIME 2 SENSITIVE Sensitive     CIPROFLOXACIN <=0.25 SENSITIVE Sensitive     GENTAMICIN <=1 SENSITIVE Sensitive     IMIPENEM 2 SENSITIVE Sensitive     PIP/TAZO 8 SENSITIVE Sensitive     CEFEPIME <=1 SENSITIVE Sensitive     * RARE PSEUDOMONAS AERUGINOSA  Acid Fast Smear (AFB)     Status: None   Collection Time: 10/09/16  2:10 PM  Result Value Ref Range Status   AFB Specimen Processing Comment  Final    Comment: Tissue Grinding and Digestion/Decontamination   Acid Fast Smear Negative  Final    Comment: (NOTE) Performed At: [facility] [place], [place] [id number] Lindon Romp MD JQ:[id number]    Source (AFB) TISSUE  Final    Comment: RIGHT ARM   Fungus Culture Result     Status: None   Collection Time: 10/09/16  2:10 PM  Result Value Ref Range Status   Result 1 Comment  Final    Comment: (NOTE) KOH/Calcofluor preparation:  no fungus observed. Performed At: [facility] [place], [place] [id number] Lindon Romp MD JM:[id number]     [id number] STUDIES/RESULTS: Dg Ribs Unilateral W/chest Right  Result Date: 10/09/2016 CLINICAL DATA:  Right-sided rib pain EXAM: RIGHT RIBS AND CHEST - 3+ VIEW COMPARISON:  Chest radiograph 09/10/2016 FINDINGS: There is a left-sided approach PICC line with tip at the cavoatrial junction. Unchanged cardiomegaly with atherosclerotic calcification in the aortic arch. No pneumothorax or sizable pleural effusion. Diffusely increased pulmonary markings. No focal consolidation. No rib fracture is identified. IMPRESSION: 1. No rib fracture.  2. Cardiomegaly and aortic atherosclerosis. Electronically Signed   By: Ulyses Jarred M.D.   On: 10/09/2016 01:01   Dg Elbow 2 Views Right  Result Date: 10/09/2016 CLINICAL DATA:  Right elbow pain for 1 month.  Multiple ulcers. EXAM: RIGHT ELBOW - 2 VIEW COMPARISON:  CT from earlier today FINDINGS:  Extensive soft tissue gas and swelling in the arm. Extent is greater than seen on this exam, but was covered on prior CT. No visible progression from prior. There are multiple open wounds per report. A large ulcer noted over olecranon spurring. Findings suggest necrotizing infection. Osteopenia. No visible infectious bony erosion. Possible joint effusion, limited by overlapping gas bubbles. Patient has already been seen by surgery and there are plans for OR debridement. IMPRESSION: 1. Diffuse soft tissue gas and swelling in the arm as seen with necrotizing infection. No visible progression compared to preceding CT. 2. Large ulcer over an olecranon enthesophyte, possible exposed bone. Electronically Signed   By: Monte Fantasia M.D.   On: 10/09/2016 09:57   Ct Humerus Right W Contrast  Result Date: 10/09/2016 CLINICAL DATA:  71 year old male with right arm wounds with current drainage and surrounding erythema. EXAM: CT OF THE UPPER RIGHT EXTREMITY WITH CONTRAST TECHNIQUE: Multidetector CT imaging of the upper right extremity was performed according to the standard protocol following intravenous contrast administration. COMPARISON:  CT of the right elbow dated 09/09/2016 CONTRAST:  100 cc Isovue-300 FINDINGS: Bones/Joint/Cartilage The bones are osteopenic. No acute fracture for dislocation. Old healed right clavicular fracture noted. No periosteal elevation or erosive changes of the bone to suggest osteomyelitis. Evaluation however is limited due to osteopenia. MRI or a white blood cell nuclear scan may provide better evaluation if there is high clinical concern for osteomyelitis. Ligaments Suboptimally assessed by CT. Muscles and Tendons No acute findings.  No intramuscular hematoma. Soft tissues There is extensive diffuse subcutaneous edema. There is skin defect with open wound in the medial aspect of the distal arm. A second open wound noted in the dorsal aspect distal on approximately 7 cm above the elbow.  Additional open wounds likely present but outside of the field of view. Subcutaneous air communicates with skin defect and extends superficial to the muscular fascia. There has been significant interval increase in the amount of soft tissue air compared to the prior CT. No discrete drainable fluid collection or abscess identified. IMPRESSION: 1. Open skin wounds with extensive amount of soft tissue air dissecting through the subcutaneous fat superficial to the muscle fascia. There has been interval increase in the size of the soft tissue air compared to the prior CT. 2. No definite evidence of osteomyelitis by CT. MRI or a WBC nuclear scan may provide better evaluation if there is high clinical concern for osteomyelitis. 3. Diffuse subcutaneous soft tissue edema. No discrete drainable fluid collection or abscess identified. Electronically Signed   By: Anner Crete M.D.   On: 10/09/2016 03:07     LOS: 5 days   Marquie Aderhold  If 7PM-7AM, please contact night-coverage [email or web address] Password [email or web address] 10/14/2016, 1:01 PM

## 2016-10-14 NOTE — Anesthesia Preprocedure Evaluation (Addendum)
Anesthesia Evaluation  Patient identified by MRN, date of birth, ID band Patient awake    Reviewed: Allergy & Precautions, NPO status , Patient's Chart, lab work & pertinent test results, reviewed documented beta blocker date and time   History of Anesthesia Complications Negative for: history of anesthetic complications  Airway Mallampati: II  TM Distance: >3 FB Neck ROM: Full    Dental  (+) Chipped, Dental Advisory Given   Pulmonary COPD, Current Smoker,    breath sounds clear to auscultation       Cardiovascular hypertension, Pt. on medications and Pt. 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 cm AAA) and +CHF   Rhythm:Regular Rate:Normal  10/10/16 ECHO: EF 60-65%, mild AS   Neuro/Psych PSYCHIATRIC DISORDERS negative neurological ROS     GI/Hepatic negative GI ROS, Neg liver ROS, (+)     substance abuse  alcohol use,   Endo/Other  diabetes (glu 218), Type 2, Insulin Dependent, Oral Hypoglycemic Agents  Renal/GU Renal disease  negative genitourinary   Musculoskeletal negative musculoskeletal ROS (+)   Abdominal   Peds negative pediatric ROS (+)  Hematology  (+) Blood dyscrasia (Hb 8.7), ,   Anesthesia Other Findings   Reproductive/Obstetrics negative OB ROS                            Lab Results  Component Value Date   WBC 11.8 (H) 10/12/2016   HGB 8.5 (L) 10/12/2016   HCT 25.7 (L) 10/12/2016   MCV 88.6 10/12/2016   PLT 378 10/12/2016   Lab Results  Component Value Date   CREATININE 0.52 (L) 10/12/2016   BUN 10 10/12/2016   NA 136 10/12/2016   K 4.1 10/12/2016   CL 101 10/12/2016   CO2 29 10/12/2016   Lab Results  Component Value Date   INR 1.19 08/30/2016   09/2016 Echo Left ventricle: The cavity size was normal. Wall thickness was   increased in a pattern of moderate to severe LVH. Systolic   function was normal. The estimated ejection fraction was in  the range of 60% to 65%. Wall motion was normal; there were no regional wall motion abnormalities. Doppler parameters are consistent with abnormal left ventricular relaxation (grade 1   diastolic dysfunction). - Aortic valve: There was mild stenosis. Valve area (VTI): 1.84 cm^2. Valve area (Vmax): 1.63 cm^2. Valve area (Vmean): 1.77 cm^2. - Left atrium: The atrium was moderately dilated. - Right atrium: The atrium was moderately dilated. - Atrial septum: There was an atrial septal aneurysm.   Anesthesia Physical Anesthesia Plan  ASA: III  Anesthesia Plan: General   Post-op Pain Management:    Induction: Intravenous  Airway Management Planned: LMA  Additional Equipment:   Intra-op Plan:   Post-operative Plan: Extubation in OR  Informed Consent: I have reviewed the patients History and Physical, chart, labs and discussed the procedure including the risks, benefits and alternatives for the proposed anesthesia with the patient or authorized representative who has indicated his/her understanding and acceptance.   Dental advisory given  Plan Discussed with: CRNA and Surgeon  Anesthesia Plan Comments: (Plan routine monitors, GA- LMA OK)       Anesthesia Quick Evaluation

## 2016-10-15 ENCOUNTER — Encounter (HOSPITAL_COMMUNITY): Payer: Self-pay | Admitting: Certified Registered"

## 2016-10-15 ENCOUNTER — Inpatient Hospital Stay (HOSPITAL_COMMUNITY): Payer: Medicare Other | Admitting: Certified Registered Nurse Anesthetist

## 2016-10-15 ENCOUNTER — Encounter (HOSPITAL_COMMUNITY)
Admission: EM | Disposition: A | Discharge status: Home / Self Care | Payer: Self-pay | Source: Home / Self Care | Attending: Internal Medicine

## 2016-10-15 DIAGNOSIS — F17200 Nicotine dependence, unspecified, uncomplicated: Secondary | ICD-10-CM

## 2016-10-15 HISTORY — PX: APPLICATION OF A-CELL OF EXTREMITY: SHX6303

## 2016-10-15 HISTORY — PX: I & D EXTREMITY: SHX5045

## 2016-10-15 HISTORY — PX: APPLICATION OF WOUND VAC: SHX5189

## 2016-10-15 LAB — BASIC METABOLIC PANEL
ANION GAP: 5 (ref 5–15)
BUN: 16 mg/dL (ref 6–20)
CALCIUM: 8.9 mg/dL (ref 8.9–10.3)
CHLORIDE: 100 mmol/L — AB (ref 101–111)
CO2: 31 mmol/L (ref 22–32)
CREATININE: 0.64 mg/dL (ref 0.61–1.24)
GFR calc non Af Amer: >60 mL/min (ref >60)
Glucose, Bld: 289 mg/dL — ABNORMAL HIGH (ref 65–99)
Potassium: 4.1 mmol/L (ref 3.5–5.1)
SODIUM: 136 mmol/L (ref 135–145)

## 2016-10-15 LAB — GLUCOSE, CAPILLARY
GLUCOSE-CAPILLARY: 218 mg/dL — AB (ref 65–99)
Glucose-Capillary: 184 mg/dL — ABNORMAL HIGH (ref 65–99)
Glucose-Capillary: 188 mg/dL — ABNORMAL HIGH (ref 65–99)
Glucose-Capillary: 314 mg/dL — ABNORMAL HIGH (ref 65–99)

## 2016-10-15 LAB — CBC
HEMATOCRIT: 27.1 % — AB (ref 39.0–52.0)
HEMOGLOBIN: 8.7 g/dL — AB (ref 13.0–17.0)
MCH: 28.7 pg (ref 26.0–34.0)
MCHC: 32.1 g/dL (ref 30.0–36.0)
MCV: 89.4 fL (ref 78.0–100.0)
Platelets: 439 10*3/uL — ABNORMAL HIGH (ref 150–400)
RBC: 3.03 MIL/uL — ABNORMAL LOW (ref 4.22–5.81)
RDW: 15.4 % (ref 11.5–15.5)
WBC: 15.4 10*3/uL — AB (ref 4.0–10.5)

## 2016-10-15 SURGERY — IRRIGATION AND DEBRIDEMENT EXTREMITY
Anesthesia: General | Site: Arm Upper | Laterality: Right

## 2016-10-15 MED ORDER — ENOXAPARIN SODIUM 40 MG/0.4ML ~~LOC~~ SOLN
40.0000 mg | SUBCUTANEOUS | Status: DC
  Administered 2016-10-16: 40 mg via SUBCUTANEOUS
  Filled 2016-10-15: qty 0.4

## 2016-10-15 MED ORDER — CIPROFLOXACIN HCL 500 MG PO TABS
750.0000 mg | ORAL_TABLET | Freq: Two times a day (BID) | ORAL | Status: DC
  Administered 2016-10-15 – 2016-10-16 (×2): 750 mg via ORAL
  Filled 2016-10-15 (×2): qty 2

## 2016-10-15 MED ORDER — GABAPENTIN 100 MG PO CAPS
100.0000 mg | ORAL_CAPSULE | Freq: Three times a day (TID) | ORAL | Status: DC
  Administered 2016-10-15 – 2016-10-16 (×2): 100 mg via ORAL
  Filled 2016-10-15 (×3): qty 1

## 2016-10-15 MED ORDER — INSULIN ASPART 100 UNIT/ML ~~LOC~~ SOLN
4.0000 [IU] | Freq: Three times a day (TID) | SUBCUTANEOUS | Status: DC
  Administered 2016-10-16: 4 [IU] via SUBCUTANEOUS

## 2016-10-15 MED ORDER — CHLORHEXIDINE GLUCONATE CLOTH 2 % EX PADS: 6.0000 | MEDICATED_PAD | Freq: Every day | CUTANEOUS | Status: DC

## 2016-10-15 MED ORDER — SODIUM CHLORIDE 0.9% FLUSH: 10.0000 mL | INTRAVENOUS | Status: DC | PRN

## 2016-10-15 MED ORDER — FENTANYL CITRATE (PF) 100 MCG/2ML IJ SOLN
INTRAMUSCULAR | Status: AC
  Filled 2016-10-15: qty 2

## 2016-10-15 MED ORDER — AMLODIPINE BESYLATE 10 MG PO TABS
10.0000 mg | ORAL_TABLET | Freq: Every day | ORAL | Status: DC
  Administered 2016-10-16: 10 mg via ORAL
  Filled 2016-10-15: qty 1

## 2016-10-15 MED ORDER — ENSURE ENLIVE PO LIQD
237.0000 mL | ORAL | Status: DC
  Administered 2016-10-16: 237 mL via ORAL

## 2016-10-15 MED ORDER — MIDAZOLAM HCL 5 MG/5ML IJ SOLN
INTRAMUSCULAR | Status: DC | PRN
  Administered 2016-10-15: 1 mg via INTRAVENOUS

## 2016-10-15 MED ORDER — ONDANSETRON HCL 4 MG/2ML IJ SOLN
INTRAMUSCULAR | Status: AC
  Filled 2016-10-15: qty 2

## 2016-10-15 MED ORDER — METOPROLOL SUCCINATE ER 100 MG PO TB24
100.0000 mg | ORAL_TABLET | Freq: Every day | ORAL | Status: DC
  Administered 2016-10-16: 100 mg via ORAL
  Filled 2016-10-15: qty 1

## 2016-10-15 MED ORDER — MIDAZOLAM HCL 2 MG/2ML IJ SOLN: 0.5000 mg | Freq: Once | INTRAMUSCULAR | Status: DC | PRN

## 2016-10-15 MED ORDER — PROPOFOL 10 MG/ML IV BOLUS
INTRAVENOUS | Status: AC
  Filled 2016-10-15: qty 20

## 2016-10-15 MED ORDER — FENTANYL CITRATE (PF) 100 MCG/2ML IJ SOLN: 25.0000 ug | INTRAMUSCULAR | Status: DC | PRN

## 2016-10-15 MED ORDER — PROMETHAZINE HCL 25 MG/ML IJ SOLN: 6.2500 mg | INTRAMUSCULAR | Status: DC | PRN

## 2016-10-15 MED ORDER — EPHEDRINE 5 MG/ML INJ
INTRAVENOUS | Status: AC
  Filled 2016-10-15: qty 10

## 2016-10-15 MED ORDER — 0.9 % SODIUM CHLORIDE (POUR BTL) OPTIME
TOPICAL | Status: DC | PRN
  Administered 2016-10-15: 1000 mL

## 2016-10-15 MED ORDER — SODIUM CHLORIDE 0.9% FLUSH
3.0000 mL | Freq: Two times a day (BID) | INTRAVENOUS | Status: DC
Start: 2016-10-15 — End: 2016-10-16

## 2016-10-15 MED ORDER — ONDANSETRON HCL 4 MG/2ML IJ SOLN
INTRAMUSCULAR | Status: DC | PRN
  Administered 2016-10-15: 4 mg via INTRAVENOUS

## 2016-10-15 MED ORDER — PROPOFOL 10 MG/ML IV BOLUS
INTRAVENOUS | Status: DC | PRN
  Administered 2016-10-15: 120 mg via INTRAVENOUS

## 2016-10-15 MED ORDER — VITAMIN B-1 100 MG PO TABS
100.0000 mg | ORAL_TABLET | Freq: Every day | ORAL | Status: DC
  Administered 2016-10-16: 100 mg via ORAL
  Filled 2016-10-15: qty 1

## 2016-10-15 MED ORDER — POLYMYXIN B SULFATE 500000 UNITS IJ SOLR
INTRAMUSCULAR | Status: DC | PRN
  Administered 2016-10-15: 500 mL

## 2016-10-15 MED ORDER — ASPIRIN 325 MG PO TABS
325.0000 mg | ORAL_TABLET | Freq: Every day | ORAL | Status: DC
  Administered 2016-10-16: 325 mg via ORAL
  Filled 2016-10-15: qty 1

## 2016-10-15 MED ORDER — INSULIN GLARGINE 100 UNIT/ML ~~LOC~~ SOLN
16.0000 [IU] | Freq: Every day | SUBCUTANEOUS | Status: DC
  Administered 2016-10-16: 16 [IU] via SUBCUTANEOUS
  Filled 2016-10-15: qty 0.16

## 2016-10-15 MED ORDER — MEPERIDINE HCL 25 MG/ML IJ SOLN: 6.2500 mg | INTRAMUSCULAR | Status: DC | PRN

## 2016-10-15 MED ORDER — NICOTINE 21 MG/24HR TD PT24
21.0000 mg | MEDICATED_PATCH | Freq: Every day | TRANSDERMAL | Status: DC
  Administered 2016-10-16: 21 mg via TRANSDERMAL
  Filled 2016-10-15: qty 1

## 2016-10-15 MED ORDER — NYSTATIN 100000 UNIT/GM EX POWD
Freq: Three times a day (TID) | CUTANEOUS | Status: DC
Start: 2016-10-15 — End: 2016-10-16
  Administered 2016-10-15: 23:00:00 via TOPICAL
  Administered 2016-10-16: 1 via TOPICAL
  Filled 2016-10-15: qty 15

## 2016-10-15 MED ORDER — MIDAZOLAM HCL 2 MG/2ML IJ SOLN
INTRAMUSCULAR | Status: AC
  Filled 2016-10-15: qty 2

## 2016-10-15 MED ORDER — VANCOMYCIN HCL 10 G IV SOLR
1250.0000 mg | Freq: Two times a day (BID) | INTRAVENOUS | Status: DC
  Administered 2016-10-15 – 2016-10-16 (×2): 1250 mg via INTRAVENOUS
  Filled 2016-10-15 (×2): qty 1250

## 2016-10-15 MED ORDER — OXYCODONE-ACETAMINOPHEN 5-325 MG PO TABS
1.0000 | ORAL_TABLET | Freq: Four times a day (QID) | ORAL | Status: DC | PRN
  Administered 2016-10-15: 2 via ORAL
  Filled 2016-10-15: qty 2

## 2016-10-15 MED ORDER — ATORVASTATIN CALCIUM 20 MG PO TABS
20.0000 mg | ORAL_TABLET | Freq: Every day | ORAL | Status: DC
  Administered 2016-10-15: 20 mg via ORAL
  Filled 2016-10-15 (×2): qty 1
  Filled 2016-10-15: qty 2

## 2016-10-15 MED ORDER — INSULIN ASPART 100 UNIT/ML ~~LOC~~ SOLN
0.0000 [IU] | Freq: Three times a day (TID) | SUBCUTANEOUS | Status: DC
  Administered 2016-10-15: 7 [IU] via SUBCUTANEOUS
  Administered 2016-10-16: 3 [IU] via SUBCUTANEOUS

## 2016-10-15 MED ORDER — FENTANYL CITRATE (PF) 100 MCG/2ML IJ SOLN
INTRAMUSCULAR | Status: DC | PRN
  Administered 2016-10-15 (×2): 50 ug via INTRAVENOUS

## 2016-10-15 MED ORDER — LACTATED RINGERS IV SOLN
INTRAVENOUS | Status: DC
  Administered 2016-10-15: 08:00:00 via INTRAVENOUS

## 2016-10-15 SURGICAL SUPPLY — 52 items
BAG DECANTER FOR FLEXI CONT (MISCELLANEOUS) ×1 IMPLANT
BANDAGE ACE 4X5 VEL STRL LF (GAUZE/BANDAGES/DRESSINGS) ×1 IMPLANT
BLADE CLIPPER SURG (BLADE) IMPLANT
BNDG GAUZE ELAST 4 BULKY (GAUZE/BANDAGES/DRESSINGS) ×2 IMPLANT
CANISTER SUCT 3000ML PPV (MISCELLANEOUS) ×2 IMPLANT
CANISTER WOUND CARE 500ML ATS (WOUND CARE) ×2 IMPLANT
CHLORAPREP W/TINT 26ML (MISCELLANEOUS) IMPLANT
CONT SPEC 4OZ CLIKSEAL STRL BL (MISCELLANEOUS) ×1 IMPLANT
COVER SURGICAL LIGHT HANDLE (MISCELLANEOUS) ×2 IMPLANT
DRAPE HALF SHEET 40X57 (DRAPES) IMPLANT
DRAPE INCISE IOBAN 66X45 STRL (DRAPES) ×1 IMPLANT
DRAPE ORTHO SPLIT 77X108 STRL (DRAPES) ×4
DRAPE SURG ORHT 6 SPLT 77X108 (DRAPES) IMPLANT
DRESSING HYDROCOLLOID 4X4 (GAUZE/BANDAGES/DRESSINGS) ×2 IMPLANT
DRSG ADAPTIC 3X8 NADH LF (GAUZE/BANDAGES/DRESSINGS) ×1 IMPLANT
DRSG CUTIMED SORBACT 7X9 (GAUZE/BANDAGES/DRESSINGS) ×1 IMPLANT
DRSG PAD ABDOMINAL 8X10 ST (GAUZE/BANDAGES/DRESSINGS) ×2 IMPLANT
DRSG VAC ATS LRG SENSATRAC (GAUZE/BANDAGES/DRESSINGS) ×1 IMPLANT
DRSG VAC ATS MED SENSATRAC (GAUZE/BANDAGES/DRESSINGS) IMPLANT
DRSG VAC ATS SM SENSATRAC (GAUZE/BANDAGES/DRESSINGS) IMPLANT
ELECT CAUTERY BLADE 6.4 (BLADE) ×2 IMPLANT
ELECT REM PT RETURN 9FT ADLT (ELECTROSURGICAL) ×2
ELECTRODE REM PT RTRN 9FT ADLT (ELECTROSURGICAL) ×1 IMPLANT
GAUZE SPONGE 4X4 12PLY STRL (GAUZE/BANDAGES/DRESSINGS) IMPLANT
GEL ULTRASOUND 20GR AQUASONIC (MISCELLANEOUS) ×3 IMPLANT
GLOVE BIO SURGEON STRL SZ 6.5 (GLOVE) ×5 IMPLANT
GOWN STRL REUS W/ TWL LRG LVL3 (GOWN DISPOSABLE) ×3 IMPLANT
GOWN STRL REUS W/TWL LRG LVL3 (GOWN DISPOSABLE) ×8
HANDPIECE INTERPULSE COAX TIP (DISPOSABLE)
KIT BASIN OR (CUSTOM PROCEDURE TRAY) ×2 IMPLANT
KIT ROOM TURNOVER OR (KITS) ×2 IMPLANT
MATRIX SURGICAL PSM 10X15CM (Tissue) ×1 IMPLANT
MATRIX SURGICAL PSM 7X10CM (Tissue) ×1 IMPLANT
MICROMATRIX 1000MG (Tissue) ×8 IMPLANT
NS IRRIG 1000ML POUR BTL (IV SOLUTION) ×2 IMPLANT
PACK GENERAL/GYN (CUSTOM PROCEDURE TRAY) ×1 IMPLANT
PACK ORTHO EXTREMITY (CUSTOM PROCEDURE TRAY) ×2 IMPLANT
PAD ARMBOARD 7.5X6 YLW CONV (MISCELLANEOUS) ×3 IMPLANT
PAD NEG PRESSURE SENSATRAC (MISCELLANEOUS) IMPLANT
SET HNDPC FAN SPRY TIP SCT (DISPOSABLE) IMPLANT
SOLUTION PARTIC MCRMTRX 1000MG (Tissue) IMPLANT
STAPLER VISISTAT 35W (STAPLE) ×1 IMPLANT
STOCKINETTE IMPERVIOUS 9X36 MD (GAUZE/BANDAGES/DRESSINGS) ×1 IMPLANT
STOCKINETTE IMPERVIOUS LG (DRAPES) IMPLANT
SUT SILK 4 0 P 3 (SUTURE) IMPLANT
SUT SILK 4 0 PS 2 (SUTURE) IMPLANT
SUT VIC AB 5-0 PS2 18 (SUTURE) ×6 IMPLANT
TOWEL OR 17X24 6PK STRL BLUE (TOWEL DISPOSABLE) ×1 IMPLANT
TOWEL OR 17X26 10 PK STRL BLUE (TOWEL DISPOSABLE) ×2 IMPLANT
TUBE CONNECTING 12X1/4 (SUCTIONS) ×2 IMPLANT
UNDERPAD 30X30 (UNDERPADS AND DIAPERS) ×2 IMPLANT
YANKAUER SUCT BULB TIP NO VENT (SUCTIONS) ×2 IMPLANT

## 2016-10-15 NOTE — Transfer of Care (Signed)
Immediate Anesthesia Transfer of Care Note  Patient: Andrew [name]  Procedure(s) Performed: Procedure(s): IRRIGATION AND DEBRIDEMENT EXTREMITY/RIGHT ELBOW (Right) APPLICATION OF A-CELL OF EXTREMITY (Right) WOUND VAC CHANGE (Right)  Patient Location: PACU  Anesthesia Type:General  Level of Consciousness: sedated and patient cooperative  Airway & Oxygen Therapy: Patient Spontanous Breathing and Patient connected to nasal cannula oxygen  Post-op Assessment: Report given to RN, Post -op Vital signs reviewed and stable and Patient moving all extremities  Post vital signs: Reviewed and stable  Last Vitals:  Vitals:   10/15/16 0508 10/15/16 0824  BP: (!) 165/66 (!) 192/66  Pulse: 66 66  Resp: 18   Temp: 37 C     Last Pain:  Vitals:   10/15/16 0508  TempSrc: Oral  PainSc:          Complications: No apparent anesthesia complications

## 2016-10-15 NOTE — Op Note (Addendum)
DATE OF OPERATION: 10/15/2016  LOCATION: [facility] Main Operating Room inpatient  PREOPERATIVE DIAGNOSIS: Right Arm wound  POSTOPERATIVE DIAGNOSIS: Same  PROCEDURE: Preparation of right arm wound 12 x 20 cm for placement of Acell (sheet 10 x 15 mg, 7 x 10 cm, powder 4 gm). Placement of VAC.  SURGEON: Stepanie Graver Sanger Lauraine Crespo, DO  ASSISTANT: Shawn Rayburn, PA  EBL: 10 cc  CONDITION: Stable  COMPLICATIONS: None  INDICATION: The patient, Andrew [name], is a 71 y.o. male born on 08-01-1946, is here for treatment right arm wound after infection.  Presents for serial pressure for placement of Acell and VAC .  PROCEDURE DETAILS:  The patient was seen prior to surgery and marked.  The IV antibiotics were given. The patient was taken to the operating room and given a general anesthetic. A standard time out was performed and all information was confirmed by those in the room. SCDs were placed.   The arm was prepped and draped in the usual sterile fashion.  The right arm wound 12 x 20 cm was irrigated with antibiotic solution and saline.  Hemostasis was achieved with pressure.  The area was clean.  The Acell powder (4 mg) was placed on the right arm wound on the muscle.  The 10 x 15 cm sheet and 7 x 10 cm sheet was placed on the wound and secured in place with a 5-0 Vicryl.  The sorbact was applied and secured with the 4-0 Silk.  The hydrogel and VAC were applied and there was an excellent seal.  The patient was allowed to wake up and taken to recovery room in stable condition at the end of the case. The family was notified at the end of the case.

## 2016-10-15 NOTE — Progress Notes (Signed)
Pharmacy Antibiotic Note  Andrew [name] is a 71 y.o. male admitted on 10/08/2016 with MRSA bacteremia and wound infection, c/o worsening appearance of wounds despite long ABX therapy.  Pharmacy has been consulted to continue vancomycin dosing that pt has been on as outpatient, started in hospital 1/15.  He is also noted with olecranon bursitis of right elbow (pseudomonas and klebsiella noted in wound cultures). Per ID, vancomycin to continue until discharge and to continue po cipro for 6 weeks  Plan: Vancomycin 1250mg  IV q12 hours Will check a vancomycin trough on 3/2  Height: 5\' 8"  (172.7 cm) Weight: 147 lb (66.7 kg) IBW/kg (Calculated) : 68.4  Temp (24hrs), Avg:97.9 F (36.6 C), Min:97.2 F (36.2 C), Max:99 F (37.2 C)   Recent Labs Lab 10/09/16 0049 10/09/16 0831 10/10/16 0420 10/11/16 1139 10/12/16 0450 10/15/16 0416  WBC 14.1*  --  11.1* 11.7* 11.8* 15.4*  CREATININE 0.53*  --  0.55* 0.55* 0.52* 0.64  VANCORANDOM  --  21  --   --   --   --     Estimated Creatinine Clearance: 79.9 mL/min (by C-G formula based on SCr of 0.64 mg/dL).    Allergies  Allergen Reactions  . Penicillins Rash and Other (See Comments)    Tolerated Zosyn Jan 2018 Possibly rash? Has patient had a PCN reaction causing immediate rash, facial/tongue/throat swelling, SOB or lightheadedness with hypotension: YES Has patient had a PCN reaction causing severe rash involving mucus membranes or skin necrosis:NO Has patient had a PCN reaction that required hospitalization NO Has patient had a PCN reaction occurring within the last 10 years:NO If all of the above answers are "NO", then may proceed with Cephalosporin use.   Antibiotics Cipro 2/27>> Cefepime 2/24>> 2/27 Vanc 1/15>>>  Vancomycin levels 2/5 as outpt VT = 10 at 1100 am 2/12 VT 16.5 creat 0.58 on 1250 q12 2/15 VT 13, creat 0.51,  vanc 1250 q12 2/19 VT 18, creat 0.42 vanc 1250 q12 2/22 VT 16.4 creat 0.43 on 1250 q12 2/23 Vanc random = 21  at 0830 am  Cultures 2/23 Wound Cx - Pseudomonas - Pan sensitive 2/23 Wound Cx - Kelbsiella - sens to cefepime, ceftriaxone, cipro  Thank you for allowing pharmacy to be a part of this patient's care.  Hildred Laser, Pharm D 10/15/2016 11:00 AM

## 2016-10-15 NOTE — Progress Notes (Signed)
PROGRESS NOTE        PATIENT DETAILS Name: Andrew [name] Age: 71 y.o. Sex: male Date of Birth: 1945/10/18 Admit Date: 10/08/2016 Admitting Physician Etta Quill, DO XAJ:OINOMV,[facility]EN[facility], MD  Brief Narrative: Patient is a 71 y.o. male with recent prolonged hospitalization for MRSA bacteremia felt to be secondary to olecranon bursitis sent home on IV vancomycin, referred back to the hospital on 2/22 by home health RN for worsening right elbow/right arm wounds. He was subsequently admitted for further evaluation and treatment.  Subjective: Lying comfortably in bed-he wants to smoke a cigarette  Assessment/Plan: Right arm/elbow necrotizing infection: Orthopedics consulted and underwent excisional debridement with wound VAC placement on 2/23 and 2/25. Subsequently plastic surgery consulted, and underwent placement of Acell and VAC on 3/1. Intraoperative cultures on 2/23 positive for Pseudomonas and Klebsiella-sensitive to ciprofloxacin. Per ID , patient will require 6 weeks of ciprofloxacin from 2/23. Plans are to continue intravenous vancomycin while patient is inpatient-and discontinue on discharge. Orthopedics has signed off, plastic surgery now following-await further recommendations from plastic surgery.  Recent history of MRSA bacteremia: Has mild leukocytosis-but afebrile and does not appear toxic. Continue IV vancomycin, blood cultures on 2/23 negative so far. Per last discharge summary-stop date 10/12/16. Infectious disease recommending that we continue vancomycin until discharge from the hospital.  Insulin-dependent type 2 diabetes: CBGs better, continue with Lantus 16 units, 4 units of NovoLog with meals and SSI.  Have counseled patient extensively regarding importance of optimal glycemic control in order for wound healing, infection control. We will continue to follow and optimize accordingly.  Anemia: Likely secondary to acute illness-no evidence of  overt blood loss. He required 1 unit of PRBC of transfusion, hemoglobin stable at 8.5. Continue to follow CBC.   Chronic diastolic heart failure: Clinically compensated, follow clinically.  Hypertension: BP well controlled-continue with metoprolol and amlodipine.  Dyslipidemia: Continue statin.  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  History of dysphagia: Per last discharge summary he was started on a dysphagia 2 diet-repeat speech therapy evaluation completed-recommendations are for regular diet.   Tobacco abuse: Continue transdermal nicotine. I do not think he has any inclination to quit at this time-he is requesting that he be allowed to smoke!!!  DVT Prophylaxis: Prophylactic Lovenox   Code Status: Full code   Family Communication: None at bedside-left message for patient's daughter  Disposition Plan: Remain inpatient-refusing CIR and SNF. Very poor social condition-probably will only agree to go home with home health services.  Antimicrobial agents: Anti-infectives    Start     Dose/Rate Route Frequency Ordered Stop   10/15/16 2200  vancomycin (VANCOCIN) 1,250 mg in sodium chloride 0.9 % 250 mL IVPB     1,250 mg 166.7 mL/hr over 90 Minutes Intravenous Every 12 hours 10/15/16 1207     10/15/16 2200  ciprofloxacin (CIPRO) tablet 750 mg     750 mg Oral 2 times daily 10/15/16 1207     10/15/16 0850  polymyxin B 500,000 Units, bacitracin 50,000 Units in sodium chloride irrigation 0.9 % 500 mL irrigation  Status:  Discontinued       As needed 10/15/16 0851 10/15/16 0931   10/13/16 1100  ciprofloxacin (CIPRO) tablet 750 mg  Status:  Discontinued     750 mg Oral 2 times daily 10/13/16  4496 10/15/16 1159   10/11/16 2200  vancomycin (VANCOCIN) 1,250 mg in sodium chloride 0.9 % 250 mL IVPB  Status:  Discontinued     1,250 mg 166.7 mL/hr over 90 Minutes Intravenous Every 12 hours 10/11/16 1358 10/15/16 1159   10/11/16 2200   ceFEPIme (MAXIPIME) 2 g in dextrose 5 % 50 mL IVPB  Status:  Discontinued     2 g 100 mL/hr over 30 Minutes Intravenous Every 8 hours 10/11/16 1405 10/13/16 0934   10/10/16 1400  ceFEPIme (MAXIPIME) 1 g in dextrose 5 % 50 mL IVPB  Status:  Discontinued     1 g 100 mL/hr over 30 Minutes Intravenous Every 8 hours 10/10/16 1140 10/11/16 1405   10/09/16 1715  vancomycin IVPB  Status:  Discontinued    Comments:  Indication: MRSA bacteremia Last Day of Therapy:  10/12/16 Labs - Sunday/Monday:  CBC/D, BMP, and vancomycin trough. Labs - Thursday:  BMP and vancomycin trough Labs - Every other week:  ESR and CRP     75 0 mg Intravenous Every 12 hours 10/09/16 1701 10/09/16 1707   10/09/16 1200  vancomycin (VANCOCIN) 1,250 mg in sodium chloride 0.9 % 250 mL IVPB  Status:  Discontinued     1,250 mg 166.7 mL/hr over 90 Minutes Intravenous Every 12 hours 10/09/16 1002 10/11/16 1358      Procedures: None  CONSULTS:  orthopedic surgery  Infectious disease Plastic surgery  Time spent: 25- minutes-Greater than 50% of this time was spent in counseling, explanation of diagnosis, planning of further management, and coordination of care.  MEDICATIONS: Scheduled Meds: . [START ON 10/16/2016] amLODipine  10 mg Oral Daily  . [START ON 10/16/2016] aspirin  325 mg Oral Daily  . atorvastatin  20 mg Oral q1800  . [START ON 10/16/2016] Chlorhexidine Gluconate Cloth  6 each Topical Q0600  . ciprofloxacin  750 mg Oral BID  . [START ON 10/16/2016] enoxaparin (LOVENOX) injection  40 mg Subcutaneous Q24H  . [START ON 10/16/2016] feeding supplement (ENSURE ENLIVE)  237 mL Oral Q24H  . gabapentin  100 mg Oral TID  . insulin aspart  0-9 Units Subcutaneous TID WC & HS  . insulin aspart  4 Units Subcutaneous TID WC  . [START ON 10/16/2016] insulin glargine  16 Units Subcutaneous Daily  . [START ON 10/16/2016] metoprolol succinate  100 mg Oral Daily  . nicotine  21 mg Transdermal Daily  . nystatin   Topical TID  . sodium  chloride flush  3 mL Intravenous Q12H  . [START ON 10/16/2016] thiamine  100 mg Oral Daily  . vancomycin  1,250 mg Intravenous Q12H   Continuous Infusions:  PRN Meds:.oxyCODONE-acetaminophen, sodium chloride flush   PHYSICAL EXAM: Vital signs: Vitals:   10/15/16 0947 10/15/16 0951 10/15/16 0953 10/15/16 1008  BP: (!) 162/72   (!) 169/64  Pulse: (!) 58 (!) 56  61  Resp: 15 15    Temp:   97.2 F (36.2 C) 97.6 F (36.4 C)  TempSrc:    Oral  SpO2: 100% 100%  100%  Weight:      Height:       Filed Weights   10/09/16 0152  Weight: 66.7 kg (147 lb)   Body mass index is 22.35 kg/m.   General appearance :Awake, alert, not in any distress. Speech Clear.  Eyes:, pupils equally reactive to light and accomodation,no scleral icterus. HEENT: Atraumatic and Normocephalic Neck: supple, no JVD. No cervical lymphadenopathy.  Resp:Good air entry bilaterally CVS: S1 S2 regular GI:  Bowel sounds present, Non tender and not distended with no gaurding, rigidity or rebound. Extremities: B/L Lower Ext shows no edema, both legs are warm to touch.Right arm dressing in place-did not open. Neurology:  speech clear,Non focal, sensation is grossly intact. Psychiatric: Normal judgment and insight. Alert and oriented x 3. Normal mood. Musculoskeletal:No digital cyanosis Skin:No Rash, warm and dry Wounds:N/A  I have personally reviewed following labs and imaging studies  LABORATORY DATA: CBC:  Recent Labs Lab 10/09/16 0049 10/10/16 0420 10/11/16 1139 10/12/16 0450 10/15/16 0416  WBC 14.1* 11.1* 11.7* 11.8* 15.4*  NEUTROABS 8.7*  --   --   --   --   HGB 11.0* 7.9* 7.7* 8.5* 8.7*  HCT 34.1* 24.5* 23.7* 25.7* 27.1*  MCV 88.3 88.4 87.8 88.6 89.4  PLT 415* 379 397 378 439*    Basic Metabolic Panel:  Recent Labs Lab 10/09/16 0049 10/10/16 0420 10/11/16 1139 10/12/16 0450 10/15/16 0416  NA 137 134* 136 136 136  K 4.1 4.0 3.9 4.1 4.1  CL 101 98* 103 101 100*  CO2 26 28 27 29 31     GLUCOSE 166* 189* 231* 127* 289*  BUN 9 12 11 10 16   CREATININE 0.53* 0.55* 0.55* 0.52* 0.64  CALCIUM 9.1 8.2* 8.2* 8.5* 8.9    GFR: Estimated Creatinine Clearance: 79.9 mL/min (by C-G formula based on SCr of 0.64 mg/dL).  Liver Function Tests: No results for input(s): AST, ALT, ALKPHOS, BILITOT, PROT, ALBUMIN in the last 168 hours. No results for input(s): LIPASE, AMYLASE in the last 168 hours. No results for input(s): AMMONIA in the last 168 hours.  Coagulation Profile: No results for input(s): INR, PROTIME in the last 168 hours.  Cardiac Enzymes: No results for input(s): CKTOTAL, CKMB, CKMBINDEX, TROPONINI in the last 168 hours.  BNP (last 3 results) No results for input(s): PROBNP in the last 8760 hours.  HbA1C: No results for input(s): HGBA1C in the last 72 hours.  CBG:  Recent Labs Lab 10/14/16 1612 10/14/16 2057 10/15/16 0804 10/15/16 0934 10/15/16 1007  GLUCAP 189* 229* 218* 188* 184*    Lipid Profile: No results for input(s): CHOL, HDL, LDLCALC, TRIG, CHOLHDL, LDLDIRECT in the last 72 hours.  Thyroid Function Tests: No results for input(s): TSH, T4TOTAL, FREET4, T3FREE, THYROIDAB in the last 72 hours.  Anemia Panel: No results for input(s): VITAMINB12, FOLATE, FERRITIN, TIBC, IRON, RETICCTPCT in the last 72 hours.  Urine analysis:    Component Value Date/Time   COLORURINE YELLOW 08/30/2016 1926   APPEARANCEUR HAZY (A) 08/30/2016 1926   LABSPEC 1.020 08/30/2016 1926   PHURINE 5.0 08/30/2016 1926   GLUCOSEU >=500 (A) 08/30/2016 1926   HGBUR MODERATE (A) 08/30/2016 1926   BILIRUBINUR NEGATIVE 08/30/2016 1926   KETONESUR 20 (A) 08/30/2016 1926   PROTEINUR 100 (A) 08/30/2016 1926   UROBILINOGEN 0.2 04/18/2015 0210   NITRITE NEGATIVE 08/30/2016 1926   LEUKOCYTESUR NEGATIVE 08/30/2016 1926    Sepsis Labs: Lactic Acid, Venous    Component Value Date/Time   LATICACIDVEN 0.9 09/08/2016 1453    MICROBIOLOGY: Recent Results (from the past 240  hour(s))  Culture, blood (routine x 2)     Status: None   Collection Time: 10/09/16 12:55 AM  Result Value Ref Range Status   Specimen Description BLOOD RIGHT HAND  Final   Special Requests IN PEDIATRIC BOTTLE 3ML  Final   Culture NO GROWTH 5 DAYS  Final   Report Status 10/14/2016 FINAL  Final  Culture, blood (routine x 2)  Status: None   Collection Time: 10/09/16  6:08 AM  Result Value Ref Range Status   Specimen Description BLOOD RIGHT HAND  Final   Special Requests BOTTLES DRAWN AEROBIC AND ANAEROBIC  5CC  Final   Culture NO GROWTH 5 DAYS  Final   Report Status 10/14/2016 FINAL  Final  MRSA PCR Screening     Status: None   Collection Time: 10/09/16  9:48 AM  Result Value Ref Range Status   MRSA by PCR NEGATIVE NEGATIVE Final    Comment:        The GeneXpert MRSA Assay (FDA approved for NASAL specimens only), is one component of a comprehensive MRSA colonization surveillance program. It is not intended to diagnose MRSA infection nor to guide or monitor treatment for MRSA infections.   Fungus Culture With Stain     Status: None (Preliminary result)   Collection Time: 10/09/16  2:10 PM  Result Value Ref Range Status   Fungus Stain Final report  Final    Comment: (NOTE) Performed At: [facility] [place], [place] [id number] Lindon Romp MD YC:[id number]    Fungus (Mycology) Culture PENDING  Incomplete   Fungal Source TISSUE  Final    Comment: RIGHT ARM   Aerobic/Anaerobic Culture (surgical/deep wound)     Status: None   Collection Time: 10/09/16  2:10 PM  Result Value Ref Range Status   Specimen Description TISSUE RIGHT ARM  Final   Special Requests PATIENT ON FOLLOWING VANC  Final   Gram Stain   Final    MODERATE WBC PRESENT, PREDOMINANTLY PMN NO ORGANISMS SEEN    Culture   Final    RARE PSEUDOMONAS AERUGINOSA RARE KLEBSIELLA OXYTOCA CRITICAL RESULT CALLED TO, READ BACK BY AND VERIFIED WITH: T PHILLIP,RN AT 1036 10/10/16 BY L  BENFIELD CONCERNING GROWTH ON CULTURE NO ANAEROBES ISOLATED    Report Status 10/14/2016 FINAL  Final   Organism ID, Bacteria PSEUDOMONAS AERUGINOSA  Final   Organism ID, Bacteria KLEBSIELLA OXYTOCA  Final      Susceptibility   Klebsiella oxytoca - MIC*    AMPICILLIN >=32 RESISTANT Resistant     CEFAZOLIN >=64 RESISTANT Resistant     CEFEPIME <=1 SENSITIVE Sensitive     CEFTAZIDIME <=1 SENSITIVE Sensitive     CEFTRIAXONE <=1 SENSITIVE Sensitive     CIPROFLOXACIN <=0.25 SENSITIVE Sensitive     GENTAMICIN <=1 SENSITIVE Sensitive     IMIPENEM <=0.25 SENSITIVE Sensitive     TRIMETH/SULFA <=20 SENSITIVE Sensitive     AMPICILLIN/SULBACTAM 16 INTERMEDIATE Intermediate     PIP/TAZO 16 SENSITIVE Sensitive     Extended ESBL NEGATIVE Sensitive     * RARE KLEBSIELLA OXYTOCA   Pseudomonas aeruginosa - MIC*    CEFTAZIDIME 2 SENSITIVE Sensitive     CIPROFLOXACIN <=0.25 SENSITIVE Sensitive     GENTAMICIN <=1 SENSITIVE Sensitive     IMIPENEM 2 SENSITIVE Sensitive     PIP/TAZO 8 SENSITIVE Sensitive     CEFEPIME <=1 SENSITIVE Sensitive     * RARE PSEUDOMONAS AERUGINOSA  Acid Fast Smear (AFB)     Status: None   Collection Time: 10/09/16  2:10 PM  Result Value Ref Range Status   AFB Specimen Processing Comment  Final    Comment: Tissue Grinding and Digestion/Decontamination   Acid Fast Smear Negative  Final    Comment: (NOTE) Performed At: [facility] [place], [place] [id number] Lindon Romp MD HY:[id number]    Source (AFB) TISSUE  Final  Comment: RIGHT ARM   Fungus Culture Result     Status: None   Collection Time: 10/09/16  2:10 PM  Result Value Ref Range Status   Result 1 Comment  Final    Comment: (NOTE) KOH/Calcofluor preparation:  no fungus observed. Performed At: [facility] [place], [place] [id number] Lindon Romp MD JJ:[id number]     BZJIRCVEL STUDIES/RESULTS: Dg Ribs Unilateral W/chest Right  Result Date:  10/09/2016 CLINICAL DATA:  Right-sided rib pain EXAM: RIGHT RIBS AND CHEST - 3+ VIEW COMPARISON:  Chest radiograph 09/10/2016 FINDINGS: There is a left-sided approach PICC line with tip at the cavoatrial junction. Unchanged cardiomegaly with atherosclerotic calcification in the aortic arch. No pneumothorax or sizable pleural effusion. Diffusely increased pulmonary markings. No focal consolidation. No rib fracture is identified. IMPRESSION: 1. No rib fracture. 2. Cardiomegaly and aortic atherosclerosis. Electronically Signed   By: Ulyses Jarred M.D.   On: 10/09/2016 01:01   Dg Elbow 2 Views Right  Result Date: 10/09/2016 CLINICAL DATA:  Right elbow pain for 1 month.  Multiple ulcers. EXAM: RIGHT ELBOW - 2 VIEW COMPARISON:  CT from earlier today FINDINGS: Extensive soft tissue gas and swelling in the arm. Extent is greater than seen on this exam, but was covered on prior CT. No visible progression from prior. There are multiple open wounds per report. A large ulcer noted over olecranon spurring. Findings suggest necrotizing infection. Osteopenia. No visible infectious bony erosion. Possible joint effusion, limited by overlapping gas bubbles. Patient has already been seen by surgery and there are plans for OR debridement. IMPRESSION: 1. Diffuse soft tissue gas and swelling in the arm as seen with necrotizing infection. No visible progression compared to preceding CT. 2. Large ulcer over an olecranon enthesophyte, possible exposed bone. Electronically Signed   By: Monte Fantasia M.D.   On: 10/09/2016 09:57   Ct Humerus Right W Contrast  Result Date: 10/09/2016 CLINICAL DATA:  71 year old male with right arm wounds with current drainage and surrounding erythema. EXAM: CT OF THE UPPER RIGHT EXTREMITY WITH CONTRAST TECHNIQUE: Multidetector CT imaging of the upper right extremity was performed according to the standard protocol following intravenous contrast administration. COMPARISON:  CT of the right elbow dated  09/09/2016 CONTRAST:  100 cc Isovue-300 FINDINGS: Bones/Joint/Cartilage The bones are osteopenic. No acute fracture for dislocation. Old healed right clavicular fracture noted. No periosteal elevation or erosive changes of the bone to suggest osteomyelitis. Evaluation however is limited due to osteopenia. MRI or a white blood cell nuclear scan may provide better evaluation if there is high clinical concern for osteomyelitis. Ligaments Suboptimally assessed by CT. Muscles and Tendons No acute findings.  No intramuscular hematoma. Soft tissues There is extensive diffuse subcutaneous edema. There is skin defect with open wound in the medial aspect of the distal arm. A second open wound noted in the dorsal aspect distal on approximately 7 cm above the elbow. Additional open wounds likely present but outside of the field of view. Subcutaneous air communicates with skin defect and extends superficial to the muscular fascia. There has been significant interval increase in the amount of soft tissue air compared to the prior CT. No discrete drainable fluid collection or abscess identified. IMPRESSION: 1. Open skin wounds with extensive amount of soft tissue air dissecting through the subcutaneous fat superficial to the muscle fascia. There has been interval increase in the size of the soft tissue air compared to the prior CT. 2. No definite evidence of osteomyelitis by CT. MRI  or a WBC nuclear scan may provide better evaluation if there is high clinical concern for osteomyelitis. 3. Diffuse subcutaneous soft tissue edema. No discrete drainable fluid collection or abscess identified. Electronically Signed   By: Anner Crete M.D.   On: 10/09/2016 03:07     LOS: 6 days   Gerardine Peltz  If 7PM-7AM, please contact night-coverage [email or web address] Password [facility] 10/15/2016, 12:53 PM

## 2016-10-15 NOTE — Interval H&P Note (Signed)
History and Physical Interval Note:  10/15/2016 6:51 AM  [name]  has presented today for surgery, with the diagnosis of Infected Right Elbow  The various methods of treatment have been discussed with the patient and family. After consideration of risks, benefits and other options for treatment, the patient has consented to  Procedure(s): IRRIGATION AND DEBRIDEMENT EXTREMITY/RIGHT ELBOW (Right) APPLICATION OF A-CELL OF EXTREMITY (Right) APPLICATION OF WOUND VAC CHANGE (Right) as a surgical intervention .  The patient's history has been reviewed, patient examined, no change in status, stable for surgery.  I have reviewed the patient's chart and labs.  Questions were answered to the patient's satisfaction.     Andrew [name]

## 2016-10-15 NOTE — Anesthesia Postprocedure Evaluation (Signed)
Anesthesia Post Note  Patient: Andrew [name]  Procedure(s) Performed: Procedure(s) (LRB): IRRIGATION AND DEBRIDEMENT EXTREMITY/RIGHT ELBOW (Right) APPLICATION OF A-CELL OF EXTREMITY (Right) WOUND VAC CHANGE (Right)  Patient location during evaluation: PACU Anesthesia Type: General Level of consciousness: awake, awake and alert and oriented Pain management: pain level controlled Vital Signs Assessment: post-procedure vital signs reviewed and stable Respiratory status: spontaneous breathing, nonlabored ventilation and respiratory function stable Cardiovascular status: blood pressure returned to baseline Anesthetic complications: no       Last Vitals:  Vitals:   10/15/16 0945 10/15/16 0953  BP:    Pulse: (!) 57   Resp: 16   Temp:  36.2 C    Last Pain:  Vitals:   10/15/16 0953  TempSrc:   PainSc: 0-No pain                 Treyshon Buchanon COKER

## 2016-10-15 NOTE — Progress Notes (Signed)
Wound depth 2cm per Shawn Rayburn PA. Information requested by [facility] for home [facility] approval. CM updated clinical liaison Liliane Bade RN.

## 2016-10-15 NOTE — Anesthesia Procedure Notes (Signed)
Procedure Name: LMA Insertion Date/Time: 10/15/2016 8:38 AM Performed by: Melina Copa, Sereen Schaff R Pre-anesthesia Checklist: Patient identified, Emergency Drugs available, Suction available and Patient being monitored Patient Re-evaluated:Patient Re-evaluated prior to inductionOxygen Delivery Method: Circle System Utilized Preoxygenation: Pre-oxygenation with 100% oxygen Intubation Type: IV induction Ventilation: Mask ventilation without difficulty LMA: LMA inserted LMA Size: 5.0 Number of attempts: 1 Placement Confirmation: positive ETCO2 Tube secured with: Tape Dental Injury: Teeth and Oropharynx as per pre-operative assessment

## 2016-10-15 NOTE — Progress Notes (Signed)
Patient ID: Andrew [name], male   DOB: Jun 13, 1946, 71 y.o.   MRN: [id number]  Plastic Surgery Follow up following Acell/VAC application:    VAC paperwork signed for home VAC.   Patient will need home health RN for [facility] dressing change next Thursday, 10/22/16.   The VAC dressing change orders are : Remove drape and try not to remove green dressing, Sorbact, which lies over the Acell and wound bed. If the Sorbact does come off with the drape, you should apply adaptic over the Acell/wound bed area before applying a new VAC sponge. The wound is large ( 12/ x 20 cm) so will need several large adaptic to cover.  Use surgical lubricant on top of the sorbact or adaptic and then apply the VAC sponge. Resume VAC therapy at 125 mm Hg continuous therapy.  Then wrap the arm with kerlix x 2  and then reapply the splint and secure with Ace wrap.    The first change should be 10/22/16 and the following Thursday 10/29/16, the nurse may just remove the VAC dressing and apply KY jelly or surgical lubricant to the sorbact/adaptic layer, then gauze, ABD pad and kerlix to secure and reapply the splint with Ace wrap. The patient should bring the [facility] machine, sponge and canister to his office visit the following day, Friday, 10/30/16.    Please call the office [phone] for questions.   Ivori Storr,PA-C Plastic Surgery [phone]

## 2016-10-15 NOTE — Progress Notes (Signed)
Patient's orders were discontinued in the computer during transfer process from PACU to 2w. RN reordered all patient's previous orders that would still be in effect post surgery. RN notified pharmacy so that they could properly retime patient's medications.

## 2016-10-15 NOTE — H&P (Signed)
Andrew [name] is an 71 y.o. male.   Chief Complaint: right arm wound HPI: The patient is a 71 yrs old male here for treatment of his right olecranon bursitis that was believed to be cause of bacteremia. He was positive for MRSA and had a severe soft tissue infection of the arm.  He was originally take to the OR with ortho and debridement was performed.  He now has a ~ 15 cm wound on the right arm.  He is doing much better clinically and stable.  There are no other skin areas of concern.  Past Medical History:  Diagnosis Date  . Blind right eye    S/P shotgun  . Chronic bronchitis ([place])   . Hyperlipemia   . Hypertension   . 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 Type II diabetes mellitus ([place])     Past Surgical History:  Procedure Laterality Date  . APPLICATION OF WOUND VAC Right 10/09/2016   Procedure: APPLICATION OF WOUND VAC;  Surgeon: Leandrew Koyanagi, MD;  Location: [facility];  Service: Orthopedics;  Laterality: Right;  . I&D EXTREMITY Right 10/09/2016   Procedure: IRRIGATION AND DEBRIDEMENT EXTREMITY RIGHT ARM WOUND;  Surgeon: Leandrew Koyanagi, MD;  Location: [facility];  Service: Orthopedics;  Laterality: Right;  . I&D EXTREMITY N/A 10/11/2016   Procedure: IRRIGATION AND DEBRIDEMENT EXTREMITY RIGHT ARM WOUND;  Surgeon: Leandrew Koyanagi, MD;  Location: [facility];  Service: Orthopedics;  Laterality: N/A;  . INCISION AND DRAINAGE OF WOUND Right 10/09/2016   arm    Family History  Problem Relation Age of Onset  . Diabetes Mellitus II Mother   . Diabetes Mellitus II Father   . Diabetes Mellitus II Sister   . Diabetes Mellitus II Sister    Social History:  reports that he has been smoking Cigarettes.  He has a 94.50 pack-year smoking history. He has quit using smokeless tobacco. His smokeless tobacco use included Chew. He reports that he drinks alcohol. He reports that he does not use drugs.  Allergies:  Allergies  Allergen Reactions  . Penicillins Rash and Other (See  Comments)    Tolerated Zosyn Jan 2018 Possibly rash? Has patient had a PCN reaction causing immediate rash, facial/tongue/throat swelling, SOB or lightheadedness with hypotension: YES Has patient had a PCN reaction causing severe rash involving mucus membranes or skin necrosis:NO Has patient had a PCN reaction that required hospitalization NO Has patient had a PCN reaction occurring within the last 10 years:NO If all of the above answers are "NO", then may proceed with Cephalosporin use.    Medications Prior to Admission  Medication Sig Dispense Refill  . amLODipine (NORVASC) 10 MG tablet Take 1 tablet (10 mg total) by mouth daily. 30 tablet 0  . aspirin 325 MG tablet Take 325 mg by mouth daily.    . feeding supplement, ENSURE ENLIVE, (ENSURE ENLIVE) LIQD Take 237 mLs by mouth daily. 237 mL 12  . gabapentin (NEURONTIN) 100 MG capsule Take 100 mg by mouth 3 (three) times daily.    . insulin aspart (NOVOLOG) 100 UNIT/ML injection Sliding scale insulin Less than 70 initiate hypoglycemia protocol 70-120  0 units 120-150 1 unit 151-200 2 units 201-250 3 units 251-300 5 units 301-350 7 units 351-400 9 units  Greater than 400 call MD (Patient taking differently: Inject 1-9 Units into the skin 3 (three) times daily with meals. Sliding scale insulin Less than 70 initiate hypoglycemia protocol 70-120  0 units 120-150  1 unit 151-200 2 units 201-250 3 units 251-300 5 units 301-350 7 units 351-400 9 units  Greater than 400 call MD) 10 mL 11  . insulin glargine (LANTUS) 100 UNIT/ML injection Inject 0.07 mLs (7 Units total) into the skin at bedtime. 10 mL 11  . metFORMIN (GLUCOPHAGE) 500 MG tablet Take 1,000 mg by mouth 2 (two) times daily with a meal.    . metoprolol succinate (TOPROL-XL) 100 MG 24 hr tablet Take 1 tablet (100 mg total) by mouth daily. Take with or immediately following a meal. 30 tablet 2  . simvastatin (ZOCOR) 40 MG tablet Take 40 mg by mouth daily.    . sitaGLIPtin  (JANUVIA) 100 MG tablet Take 100 mg by mouth daily.    Marland Kitchen thiamine 100 MG tablet Take 1 tablet (100 mg total) by mouth daily. 30 tablet 2  . [EXPIRED] vancomycin IVPB Inject 750 mg into the vein every 12 (twelve) hours. Indication: MRSA bacteremia Last Day of Therapy:  10/12/16 Labs - Sunday/Monday:  CBC/D, BMP, and vancomycin trough. Labs - Thursday:  BMP and vancomycin trough Labs - Every other week:  ESR and CRP (Patient taking differently: Inject 1,250 mg into the vein every 12 (twelve) hours. Indication: MRSA bacteremia Last Day of Therapy:  10/12/16 Labs - Sunday/Monday:  CBC/D, BMP, and vancomycin trough. Labs - Thursday:  BMP and vancomycin trough Labs - Every other week:  ESR and CRP) 33 Units 0    Results for orders placed or performed during the hospital encounter of 10/08/16 (from the past 48 hour(s))  Glucose, capillary     Status: Abnormal   Collection Time: 10/13/16  8:49 AM  Result Value Ref Range   Glucose-Capillary 290 (H) 65 - 99 mg/dL   Comment 1 Notify RN    Comment 2 Document in Chart   Glucose, capillary     Status: Abnormal   Collection Time: 10/13/16 11:18 AM  Result Value Ref Range   Glucose-Capillary 306 (H) 65 - 99 mg/dL   Comment 1 Notify RN    Comment 2 Document in Chart   Glucose, capillary     Status: None   Collection Time: 10/13/16  4:08 PM  Result Value Ref Range   Glucose-Capillary 87 65 - 99 mg/dL   Comment 1 Notify RN    Comment 2 Document in Chart   Glucose, capillary     Status: Abnormal   Collection Time: 10/13/16  9:11 PM  Result Value Ref Range   Glucose-Capillary 253 (H) 65 - 99 mg/dL   Comment 1 Notify RN   Glucose, capillary     Status: Abnormal   Collection Time: 10/14/16  6:14 AM  Result Value Ref Range   Glucose-Capillary 294 (H) 65 - 99 mg/dL   Comment 1 Notify RN   Glucose, capillary     Status: Abnormal   Collection Time: 10/14/16 12:03 PM  Result Value Ref Range   Glucose-Capillary 326 (H) 65 - 99 mg/dL   Comment 1 Notify  RN    Comment 2 Document in Chart   Glucose, capillary     Status: Abnormal   Collection Time: 10/14/16  4:12 PM  Result Value Ref Range   Glucose-Capillary 189 (H) 65 - 99 mg/dL   Comment 1 Notify RN    Comment 2 Document in Chart   Glucose, capillary     Status: Abnormal   Collection Time: 10/14/16  8:57 PM  Result Value Ref Range   Glucose-Capillary 229 (H) 65 -  99 mg/dL  CBC     Status: Abnormal   Collection Time: 10/15/16  4:16 AM  Result Value Ref Range   WBC 15.4 (H) 4.0 - 10.5 K/uL   RBC 3.03 (L) 4.22 - 5.81 MIL/uL   Hemoglobin 8.7 (L) 13.0 - 17.0 g/dL   HCT 27.1 (L) 39.0 - 52.0 %   MCV 89.4 78.0 - 100.0 fL   MCH 28.7 26.0 - 34.0 pg   MCHC 32.1 30.0 - 36.0 g/dL   RDW 15.4 11.5 - 15.5 %   Platelets 439 (H) 150 - 400 K/uL  Basic metabolic panel     Status: Abnormal   Collection Time: 10/15/16  4:16 AM  Result Value Ref Range   Sodium 136 135 - 145 mmol/L   Potassium 4.1 3.5 - 5.1 mmol/L   Chloride 100 (L) 101 - 111 mmol/L   CO2 31 22 - 32 mmol/L   Glucose, Bld 289 (H) 65 - 99 mg/dL   BUN 16 6 - 20 mg/dL   Creatinine, Ser 0.64 0.61 - 1.24 mg/dL   Calcium 8.9 8.9 - 10.3 mg/dL   GFR calc non Af Amer >60 >60 mL/min   GFR calc Af Amer >60 >60 mL/min    Comment: (NOTE) The eGFR has been calculated using the CKD EPI equation. This calculation has not been validated in all clinical situations. eGFR's persistently <60 mL/min signify possible Chronic Kidney Disease.    Anion gap 5 5 - 15   No results found.  Review of Systems  Constitutional: Positive for chills, fever and malaise/fatigue.  HENT: Negative.   Eyes: Negative.   Respiratory: Negative.   Cardiovascular: Negative.   Gastrointestinal: Negative.   Genitourinary: Negative.   Musculoskeletal: Negative.   Skin: Negative.   Neurological: Positive for weakness.  Psychiatric/Behavioral: Negative.     Blood pressure (!) 165/66, pulse 66, temperature 98.6 F (37 C), temperature source Oral, resp. rate 18,  height 5' 8"  (1.727 m), weight 66.7 kg (147 lb), SpO2 99 %. Physical Exam  Constitutional: He is oriented to person, place, and time. He appears well-developed and well-nourished.  HENT:  Head: Normocephalic and atraumatic.  Eyes: EOM are normal. Pupils are equal, round, and reactive to light.  Cardiovascular: Normal rate.   Respiratory: Effort normal.  GI: Soft. He exhibits no distension.  Musculoskeletal:       Arms: Neurological: He is alert and oriented to person, place, and time.  Psychiatric: He has a normal mood and affect. His behavior is normal. Judgment and thought content normal.     Assessment/Plan Plan for irrigation and debridement of right arm wound with Acell and VAC placement.  Wallace Going, DO 10/15/2016, 6:47 AM

## 2016-10-16 ENCOUNTER — Encounter (HOSPITAL_COMMUNITY): Payer: Self-pay | Admitting: Plastic Surgery

## 2016-10-16 DIAGNOSIS — L0390 Cellulitis, unspecified: Secondary | ICD-10-CM

## 2016-10-16 LAB — VANCOMYCIN, TROUGH: VANCOMYCIN TR: 36 ug/mL — AB (ref 15–20)

## 2016-10-16 LAB — GLUCOSE, CAPILLARY
GLUCOSE-CAPILLARY: 283 mg/dL — AB (ref 65–99)
Glucose-Capillary: 207 mg/dL — ABNORMAL HIGH (ref 65–99)

## 2016-10-16 MED ORDER — INSULIN GLARGINE 100 UNIT/ML ~~LOC~~ SOLN: 16.0000 [IU] | Freq: Every day | SUBCUTANEOUS | 0 refills | Status: DC

## 2016-10-16 MED ORDER — OXYCODONE-ACETAMINOPHEN 5-325 MG PO TABS: 1.0000 | ORAL_TABLET | Freq: Four times a day (QID) | ORAL | 0 refills | Status: DC | PRN

## 2016-10-16 MED ORDER — CIPROFLOXACIN HCL 750 MG PO TABS: 750.0000 mg | ORAL_TABLET | Freq: Two times a day (BID) | ORAL | 0 refills | Status: DC

## 2016-10-16 NOTE — Progress Notes (Signed)
Andrew [name],Andrew [name] Daughter   [place] [phone]    Ayman, Andrew [name] [phone]      Spoke with Andrew [name] who states she cannot provide transportation and patient's daughter whom she could a hold of (CM attempted contact 3X today, left 1 VM) states that she could get him tomorrow. CM spoke with son at the house who states he could be home at 5:00 to receive patient, but will be at work before, and does not have a car to get to hospital. Patient updated with situation and agreed to have [facility] deliver him home after 5:00 tonight. CM called [facility] for transport, and updated Programmer, applications.

## 2016-10-16 NOTE — Progress Notes (Signed)
PT Cancellation Note  Patient Details Name: Andrew [name] MRN: [id number] DOB: 1945-09-28   Cancelled Treatment:    Reason Eval/Treat Not Completed: Patient declined, no reason specified (pt refused any mobility, HEP or functional activity. Pt continues to insist he has too much pain and weakness to move, he will do it his way and start walking when he feels like he can. Pt states understanding of role of therapy but denies participation.) Continue to recommend SNF as pt has not walked since admission.    [name] [name] 10/16/2016, 9:43 AM  [name], [name]

## 2016-10-16 NOTE — Progress Notes (Signed)
OT Cancellation Note  Patient Details Name: Andrew [name] MRN: [id number] DOB: 12/10/1945   Cancelled Treatment:    Reason Eval/Treat Not Completed: Patient declined, no reason specified. Pt adamantly refusing to work with therapy at this time, reports "I can't do it, my legs are too weak". Educated pt on role of acute PT/OT and pt continues to refuse. Will follow up as time allows.  Binnie Kand M.S., OTR/L Pager: [phone]  10/16/2016, 9:19 AM

## 2016-10-16 NOTE — Progress Notes (Signed)
ANTIBIOTIC CONSULT NOTE - INITIAL  Pharmacy Consult for Vanco Indication: MRSA bacteremia  Allergies  Allergen Reactions  . Penicillins Rash and Other (See Comments)    Tolerated Zosyn Jan 2018 Possibly rash? Has patient had a PCN reaction causing immediate rash, facial/tongue/throat swelling, SOB or lightheadedness with hypotension: YES Has patient had a PCN reaction causing severe rash involving mucus membranes or skin necrosis:NO Has patient had a PCN reaction that required hospitalization NO Has patient had a PCN reaction occurring within the last 10 years:NO If all of the above answers are "NO", then may proceed with Cephalosporin use.    Patient Measurements: Height: 5\' 8"  (172.7 cm) Weight: 147 lb (66.7 kg) IBW/kg (Calculated) : 68.4 Adjusted Body Weight:    Vital Signs: Temp: 98.1 F (36.7 C) (03/02 0501) Temp Source: Oral (03/02 0501) BP: 152/76 (03/02 0501) Pulse Rate: 64 (03/02 0501) Intake/Output from previous day: 03/01 0701 - 03/02 0700 In: 650 [I.V.:650] Out: 10 [Blood:10] Intake/Output from this shift: Total I/O In: -  Out: 500 [Urine:500]  Labs:  Recent Labs  10/15/16 0416  WBC 15.4*  HGB 8.7*  PLT 439*  CREATININE 0.64   Estimated Creatinine Clearance: 79.9 mL/min (by C-G formula based on SCr of 0.64 mg/dL).  Recent Labs  10/16/16 0904  VANCOTROUGH 20*     Microbiology:   Medical History: Past Medical History:  Diagnosis Date  . Blind right eye    S/P shotgun  . Chronic bronchitis ([place])   . Hyperlipemia   . Hypertension   . 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 Type II diabetes mellitus ([facility])    Assessment:  ID: Cipro D5 and Vanc for Klebsiella wound infection and MRSA bacteremia. Septic olecranon bursitis of R elbow vanc started 1/15 w/ plans to end 2/26.  Per med Hx vanc scheduled to end 2/26 but worsening wounds despite long abx tx. ID seeing:  can go home with oral high dose cipro for 6  weeks total from last surgery 2/23, stop vanc  at discharge. S/p I&D and VAC placement on 4/1. -WBC= 15.4, afeb, Scr 0.64 stable. - 3/2 VT 36  2/23 Wound Cx - Pseudomonas - Pan sensitive 2/23 Wound Cx - Kelbsiella - sens to cefepime, ceftriaxone, cipro  Cipro 2/27>> Cefepime 2/24>> 2/27 Vanc 1/15>>> 2/5 as outpt VT = 10 at 1100 am 2/12 VT 16.5 creat 0.58 on 1250 q12 2/15 VT 13, creat 0.51,  vanc 1250 q12 2/19 VT 18, creat 0.42 vanc 1250 q12 2/22 VT 16.4 creat 0.43 on 1250 q12 2/23 Vanc random = 21 at 0830 am 3/2: VT: 36 on 1250mg /12h  Goal of Therapy:  Vancomycin trough level 15-20 mcg/ml  Plan:  -Hold Vancomycin (take down dose running). D/c upon discharge (today) -cipro po 750mg  q12h   Garl Speigner S. Alford Highland, PharmD, [facility] Clinical Staff Pharmacist Pager [phone]  Eilene Ghazi Stillinger 10/16/2016,10:30 AM

## 2016-10-16 NOTE — Discharge Summary (Signed)
PATIENT DETAILS Name: Andrew [name] Age: 71 y.o. Sex: male Date of Birth: 25-Feb-1946 MRN: [id number]. Admitting Physician: Etta Quill, DO [id number] Mallie Mussel, MD  Admit Date: 10/08/2016 Discharge date: 10/16/2016  Recommendations for Outpatient Follow-up:  1. Follow up with PCP in 1-2 weeks 2. Please obtain BMP/CBC in one week 3. Please check CBC and chemistries weekly while on ciprofloxacin 4. Please ensure follow-up with plastic surgery and infectious disease.  Admitted From:  Home with home health services  Disposition: Home with home health services (refused SNF, CIR)   Home Health:  Yes  Equipment/Devices: Vac placed in the OR on 3/1  Discharge Condition: Stable/ guarded/hospice)  CODE STATUS: FULL COD  Diet recommendation:  Heart Healthy / Carb Modified   Brief Summary: See H&P, Labs, Consult and Test reports for all details in brief, Patient is a 71 y.o. male with recent prolonged hospitalization for MRSA bacteremia felt to be secondary to olecranon bursitis sent home on IV vancomycin, referred back to the hospital on 2/22 by home health RN for worsening right elbow/right arm wounds-due to necrotizing skin infection. He was taken to the operating room by orthopedics 2 for incision and debridement, following which plastic surgery assumed care, he was taken back to the operating room on 3/14 Acell and VAC placement. See below for further details  Brief Hospital Course: Right arm/elbow necrotizing infection: Orthopedics consulted and underwent excisional debridement with wound VAC placement on 2/23 and 2/25. Subsequently plastic surgery consulted, and underwent placement of Acell and VAC on 3/1. Intraoperative cultures on 2/23 positive for Pseudomonas and Klebsiella-sensitive to ciprofloxacin. Per ID , patient will require 6 weeks of ciprofloxacin from 2/23. He was continued on intravenous vancomycin while patient is inpatient, and per ID recommendations this  is being discontinued on discharge.Orthopedics has signed off, plastic surgery now is managing his wound care issues. Spoke with Dr. Audelia Hives over the phone on 3/2, okay to discharge, will have outpatient follow-up with plastic surgery. Please note, patient refused SNF or CIR placement, being discharged with maximal home health services.  Recent history of MRSA bacteremia: Has mild leukocytosis-but afebrile and does not appear toxic. Continue IV vancomycin, blood cultures on 2/23 negative so far. Per last discharge summary-stop date 10/12/16. Infectious disease recommended that we continue vancomycin until discharge from the hospital.  Insulin-dependent type 2 diabetes: CBGs better, continue with Lantus 16 units and sliding scale insulin regimen at home. Resume oral hypoglycemic agents. Have counseled patient extensively regarding importance of optimal glycemic control in order for wound healing, infection control.   Anemia: Likely secondary to acute illness-no evidence of overt blood loss. He required 1 unit of PRBC of transfusion, hemoglobin stable at 8.7. Continue to follow CBC.   Chronic diastolic heart failure: Clinically compensated, follow clinically.  Hypertension: BP well controlled-continue with metoprolol and amlodipine.  Dyslipidemia: Continue statin.  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  History of dysphagia: Per last discharge summary he was started on a dysphagia 2 diet-repeat speech therapy evaluation completed-recommendations are for regular diet.   Tobacco abuse: Continue transdermal nicotine. I do not think he has any inclination to quit at this time-he is requesting that he be allowed to smoke!!!  Note-tred to reach patient's daughter on the phone on 3/1 and 3/2-both attempts were unsuccessful. Patient aware that he requires aggressive wound care control and close follow-up plastic surgery, to  prevent complications with his right arm-that may result  in significant disability/loss of limb. He does not want to go to SNF/CIR-just wants to go home.  Discharge Diagnoses:  Principal Problem:   Septic olecranon bursitis of right elbow Active Problems:   Hypertension   Bacteremia due to methicillin resistant Staphylococcus aureus   Pressure injury of skin   Soft tissue infection   Gram-negative infection   Discharge Instructions:  Activity:  As tolerated with Full fall precautions use walker/cane & assistance as needed  Discharge Instructions    Call MD for:  redness, tenderness, or signs of infection (pain, swelling, redness, odor or green/yellow discharge around incision site)    Complete by:  As directed    Call MD for:  temperature >100.4    Complete by:  As directed    Diet - low sodium heart healthy    Complete by:  As directed    Discharge instructions    Complete by:  As directed    Follow with Primary MD  Woody Seller, MD  And Plastic Surgeon-Dr Dillingham as instructed  Please stop smoking  Please get a complete blood count and chemistry panel checked by your Primary MD at your next visit, and again as instructed by your Primary MD.  Get Medicines reviewed and adjusted: Please take all your medications with you for your next visit with your Primary MD  Laboratory/radiological data: Please request your Primary MD to go over all hospital tests and procedure/radiological results at the follow up, please ask your Primary MD to get all Hospital records sent to his/her office.  In some cases, they will be blood work, cultures and biopsy results pending at the time of your discharge. Please request that your primary care M.D. follows up on these results.  Also Note the following: If you experience worsening of your admission symptoms, develop shortness of breath, life threatening emergency, suicidal or homicidal thoughts you must seek medical attention immediately  by calling 911 or calling your MD immediately  if symptoms less severe.  You must read complete instructions/literature along with all the possible adverse reactions/side effects for all the Medicines you take and that have been prescribed to you. Take any new Medicines after you have completely understood and accpet all the possible adverse reactions/side effects.   Do not drive when taking Pain medications or sleeping medications (Benzodaizepines)  Do not take more than prescribed Pain, Sleep and Anxiety Medications. It is not advisable to combine anxiety,sleep and pain medications without talking with your primary care practitioner  Special Instructions: If you have smoked or chewed Tobacco  in the last 2 yrs please stop smoking, stop any regular Alcohol  and or any Recreational drug use.  Wear Seat belts while driving.  Please note: You were cared for by a hospitalist during your hospital stay. Once you are discharged, your primary care physician will handle any further medical issues. Please note that NO REFILLS for any discharge medications will be authorized once you are discharged, as it is imperative that you return to your primary care physician (or establish a relationship with a primary care physician if you do not have one) for your post hospital discharge needs so that they can reassess your need for medications and monitor your lab values.   Discharge wound care:    Complete by:  As directed    Patient will need home health RN for [facility] dressing change next Thursday, 10/22/16.   The VAC dressing change orders are : Remove drape and try not to remove green  dressing, Sorbact, which lies over the Acell and wound bed. If the Sorbact does come off with the drape, you should apply adaptic over the Acell/wound bed area before applying a new VAC sponge. The wound is large ( 12/ x 20 cm) so will need several large adaptic to cover.  Use surgical lubricant on top of the sorbact or adaptic and  then apply the VAC sponge. Resume VAC therapy at 125 mm Hg continuous therapy.  Then wrap the arm with kerlix x 2  and then reapply the splint and secure with Ace wrap.    The first change should be 10/22/16 and the following Thursday 10/29/16, the nurse may just remove the VAC dressing and apply KY jelly or surgical lubricant to the sorbact/adaptic layer, then gauze, ABD pad and kerlix to secure and reapply the splint with Ace wrap. The patient should bring the [facility] machine, sponge and canister to his office visit with Plastic Surgery the following day, Friday, 10/30/16   Increase activity slowly    Complete by:  As directed      Allergies as of 10/16/2016      Reactions   Penicillins Rash, Other (See Comments)   Tolerated Zosyn Jan 2018 Possibly rash? Has patient had a PCN reaction causing immediate rash, facial/tongue/throat swelling, SOB or lightheadedness with hypotension: YES Has patient had a PCN reaction causing severe rash involving mucus membranes or skin necrosis:NO Has patient had a PCN reaction that required hospitalization NO Has patient had a PCN reaction occurring within the last 10 years:NO If all of the above answers are "NO", then may proceed with Cephalosporin use.      Medication List    STOP taking these medications   vancomycin IVPB     TAKE these medications   amLODipine 10 MG tablet Commonly known as:  NORVASC Take 1 tablet (10 mg total) by mouth daily.   aspirin 325 MG tablet Take 325 mg by mouth daily.   ciprofloxacin 750 MG tablet Commonly known as:  CIPRO Take 1 tablet (750 mg total) by mouth 2 (two) times daily.   feeding supplement (ENSURE ENLIVE) Liqd Take 237 mLs by mouth daily.   gabapentin 100 MG capsule Commonly known as:  NEURONTIN Take 100 mg by mouth 3 (three) times daily.   insulin aspart 100 UNIT/ML injection Commonly known as:  NOVOLOG Sliding scale insulin Less than 70 initiate hypoglycemia protocol 70-120  0 units 120-150 1 unit  151-200 2 units 201-250 3 units 251-300 5 units 301-350 7 units 351-400 9 units  Greater than 400 call MD What changed:  how much to take  how to take this  when to take this  additional instructions   insulin glargine 100 UNIT/ML injection Commonly known as:  LANTUS Inject 0.16 mLs (16 Units total) into the skin at bedtime. What changed:  how much to take   metFORMIN 500 MG tablet Commonly known as:  GLUCOPHAGE Take 1,000 mg by mouth 2 (two) times daily with a meal.   metoprolol succinate 100 MG 24 hr tablet Commonly known as:  TOPROL-XL Take 1 tablet (100 mg total) by mouth daily. Take with or immediately following a meal.   oxyCODONE-acetaminophen 5-325 MG tablet Commonly known as:  PERCOCET/ROXICET Take 1-2 tablets by mouth every 6 (six) hours as needed for moderate pain or severe pain.   simvastatin 40 MG tablet Commonly known as:  ZOCOR Take 40 mg by mouth daily.   sitaGLIPtin 100 MG tablet Commonly known as:  JANUVIA Take 100 mg by mouth daily.   thiamine 100 MG tablet Take 1 tablet (100 mg total) by mouth daily.            Durable Medical Equipment        Start     Ordered   10/15/16 1207  For home use only DME Hospital bed  Once    Question Answer Comment  Patient has (list medical condition): right elbow necrotizing infection s/p debridement with wound VAC and UE sling   The above medical condition requires: Patient requires the ability to reposition frequently   Head must be elevated greater than: 30 degrees   Bed type Semi-electric   Support Surface: Gel Overlay      10/15/16 1207     Follow-up Information    CLAIRE S DILLINGHAM, DO. Call on 10/30/2016.   Specialty:  Plastic Surgery Why:  Please call the office to arrange follow up appointment for Friday, March 16th Contact information: [place] [address] [phone]        Woody Seller, MD. Schedule an appointment as soon as possible for a visit in 1  week(s).   Specialty:  Family Medicine Contact information: [address] [place] [address] [phone]          Allergies  Allergen Reactions  . Penicillins Rash and Other (See Comments)    Tolerated Zosyn Jan 2018 Possibly rash? Has patient had a PCN reaction causing immediate rash, facial/tongue/throat swelling, SOB or lightheadedness with hypotension: YES Has patient had a PCN reaction causing severe rash involving mucus membranes or skin necrosis:NO Has patient had a PCN reaction that required hospitalization NO Has patient had a PCN reaction occurring within the last 10 years:NO If all of the above answers are "NO", then may proceed with Cephalosporin use.    Consultations:   ID, orthopedic surgery and Plastic surgery   Other Procedures/Studies: Dg Ribs Unilateral W/chest Right  Result Date: 10/09/2016 CLINICAL DATA:  Right-sided rib pain EXAM: RIGHT RIBS AND CHEST - 3+ VIEW COMPARISON:  Chest radiograph 09/10/2016 FINDINGS: There is a left-sided approach PICC line with tip at the cavoatrial junction. Unchanged cardiomegaly with atherosclerotic calcification in the aortic arch. No pneumothorax or sizable pleural effusion. Diffusely increased pulmonary markings. No focal consolidation. No rib fracture is identified. IMPRESSION: 1. No rib fracture. 2. Cardiomegaly and aortic atherosclerosis. Electronically Signed   By: Ulyses Jarred M.D.   On: 10/09/2016 01:01   Dg Elbow 2 Views Right  Result Date: 10/09/2016 CLINICAL DATA:  Right elbow pain for 1 month.  Multiple ulcers. EXAM: RIGHT ELBOW - 2 VIEW COMPARISON:  CT from earlier today FINDINGS: Extensive soft tissue gas and swelling in the arm. Extent is greater than seen on this exam, but was covered on prior CT. No visible progression from prior. There are multiple open wounds per report. A large ulcer noted over olecranon spurring. Findings suggest necrotizing infection. Osteopenia. No visible infectious bony erosion.  Possible joint effusion, limited by overlapping gas bubbles. Patient has already been seen by surgery and there are plans for OR debridement. IMPRESSION: 1. Diffuse soft tissue gas and swelling in the arm as seen with necrotizing infection. No visible progression compared to preceding CT. 2. Large ulcer over an olecranon enthesophyte, possible exposed bone. Electronically Signed   By: Monte Fantasia M.D.   On: 10/09/2016 09:57   Ct Humerus Right W Contrast  Result Date: 10/09/2016 CLINICAL DATA:  71 year old male with right  arm wounds with current drainage and surrounding erythema. EXAM: CT OF THE UPPER RIGHT EXTREMITY WITH CONTRAST TECHNIQUE: Multidetector CT imaging of the upper right extremity was performed according to the standard protocol following intravenous contrast administration. COMPARISON:  CT of the right elbow dated 09/09/2016 CONTRAST:  100 cc Isovue-300 FINDINGS: Bones/Joint/Cartilage The bones are osteopenic. No acute fracture for dislocation. Old healed right clavicular fracture noted. No periosteal elevation or erosive changes of the bone to suggest osteomyelitis. Evaluation however is limited due to osteopenia. MRI or a white blood cell nuclear scan may provide better evaluation if there is high clinical concern for osteomyelitis. Ligaments Suboptimally assessed by CT. Muscles and Tendons No acute findings.  No intramuscular hematoma. Soft tissues There is extensive diffuse subcutaneous edema. There is skin defect with open wound in the medial aspect of the distal arm. A second open wound noted in the dorsal aspect distal on approximately 7 cm above the elbow. Additional open wounds likely present but outside of the field of view. Subcutaneous air communicates with skin defect and extends superficial to the muscular fascia. There has been significant interval increase in the amount of soft tissue air compared to the prior CT. No discrete drainable fluid collection or abscess identified.  IMPRESSION: 1. Open skin wounds with extensive amount of soft tissue air dissecting through the subcutaneous fat superficial to the muscle fascia. There has been interval increase in the size of the soft tissue air compared to the prior CT. 2. No definite evidence of osteomyelitis by CT. MRI or a WBC nuclear scan may provide better evaluation if there is high clinical concern for osteomyelitis. 3. Diffuse subcutaneous soft tissue edema. No discrete drainable fluid collection or abscess identified. Electronically Signed   By: Anner Crete M.D.   On: 10/09/2016 03:07     TODAY-DAY OF DISCHARGE:  Subjective:   Lemmie Crus today has no headache,no chest abdominal pain,no new weakness tingling or numbness, feels much better wants to go home today.  Objective:   Blood pressure (!) 152/76, pulse 64, temperature 98.1 F (36.7 C), temperature source Oral, resp. rate 19, height 5\' 8"  (1.727 m), weight 66.7 kg (147 lb), SpO2 94 %.  Intake/Output Summary (Last 24 hours) at 10/16/16 0722 Last data filed at 10/15/16 1000  Gross per 24 hour  Intake              650 ml  Output               10 ml  Net              640 ml   Filed Weights   10/09/16 0152  Weight: 66.7 kg (147 lb)    Exam: Awake Alert, Oriented *3, No new F.N deficits, Normal affect [place].AT,PERRAL Supple Neck,No JVD, No cervical lymphadenopathy appriciated.  Symmetrical Chest wall movement, Good air movement bilaterally, CTAB RRR,No Gallops,Rubs or new Murmurs, No Parasternal Heave +ve B.Sounds, Abd Soft, Non tender, No organomegaly appriciated, No rebound -guarding or rigidity. No Cyanosis, Clubbing or edema, No new Rash or bruise   PERTINENT RADIOLOGIC STUDIES: Dg Ribs Unilateral W/chest Right  Result Date: 10/09/2016 CLINICAL DATA:  Right-sided rib pain EXAM: RIGHT RIBS AND CHEST - 3+ VIEW COMPARISON:  Chest radiograph 09/10/2016 FINDINGS: There is a left-sided approach PICC line with tip at the cavoatrial junction.  Unchanged cardiomegaly with atherosclerotic calcification in the aortic arch. No pneumothorax or sizable pleural effusion. Diffusely increased pulmonary markings. No focal consolidation. No rib fracture is identified.  IMPRESSION: 1. No rib fracture. 2. Cardiomegaly and aortic atherosclerosis. Electronically Signed   By: Ulyses Jarred M.D.   On: 10/09/2016 01:01   Dg Elbow 2 Views Right  Result Date: 10/09/2016 CLINICAL DATA:  Right elbow pain for 1 month.  Multiple ulcers. EXAM: RIGHT ELBOW - 2 VIEW COMPARISON:  CT from earlier today FINDINGS: Extensive soft tissue gas and swelling in the arm. Extent is greater than seen on this exam, but was covered on prior CT. No visible progression from prior. There are multiple open wounds per report. A large ulcer noted over olecranon spurring. Findings suggest necrotizing infection. Osteopenia. No visible infectious bony erosion. Possible joint effusion, limited by overlapping gas bubbles. Patient has already been seen by surgery and there are plans for OR debridement. IMPRESSION: 1. Diffuse soft tissue gas and swelling in the arm as seen with necrotizing infection. No visible progression compared to preceding CT. 2. Large ulcer over an olecranon enthesophyte, possible exposed bone. Electronically Signed   By: Monte Fantasia M.D.   On: 10/09/2016 09:57   Ct Humerus Right W Contrast  Result Date: 10/09/2016 CLINICAL DATA:  71 year old male with right arm wounds with current drainage and surrounding erythema. EXAM: CT OF THE UPPER RIGHT EXTREMITY WITH CONTRAST TECHNIQUE: Multidetector CT imaging of the upper right extremity was performed according to the standard protocol following intravenous contrast administration. COMPARISON:  CT of the right elbow dated 09/09/2016 CONTRAST:  100 cc Isovue-300 FINDINGS: Bones/Joint/Cartilage The bones are osteopenic. No acute fracture for dislocation. Old healed right clavicular fracture noted. No periosteal elevation or erosive  changes of the bone to suggest osteomyelitis. Evaluation however is limited due to osteopenia. MRI or a white blood cell nuclear scan may provide better evaluation if there is high clinical concern for osteomyelitis. Ligaments Suboptimally assessed by CT. Muscles and Tendons No acute findings.  No intramuscular hematoma. Soft tissues There is extensive diffuse subcutaneous edema. There is skin defect with open wound in the medial aspect of the distal arm. A second open wound noted in the dorsal aspect distal on approximately 7 cm above the elbow. Additional open wounds likely present but outside of the field of view. Subcutaneous air communicates with skin defect and extends superficial to the muscular fascia. There has been significant interval increase in the amount of soft tissue air compared to the prior CT. No discrete drainable fluid collection or abscess identified. IMPRESSION: 1. Open skin wounds with extensive amount of soft tissue air dissecting through the subcutaneous fat superficial to the muscle fascia. There has been interval increase in the size of the soft tissue air compared to the prior CT. 2. No definite evidence of osteomyelitis by CT. MRI or a WBC nuclear scan may provide better evaluation if there is high clinical concern for osteomyelitis. 3. Diffuse subcutaneous soft tissue edema. No discrete drainable fluid collection or abscess identified. Electronically Signed   By: Anner Crete M.D.   On: 10/09/2016 03:07     PERTINENT LAB RESULTS: CBC:  Recent Labs  10/15/16 0416  WBC 15.4*  HGB 8.7*  HCT 27.1*  PLT 439*   CMET CMP     Component Value Date/Time   NA 136 10/15/2016 0416   K 4.1 10/15/2016 0416   CL 100 (L) 10/15/2016 0416   CO2 31 10/15/2016 0416   GLUCOSE 289 (H) 10/15/2016 0416   BUN 16 10/15/2016 0416   CREATININE 0.64 10/15/2016 0416   CALCIUM 8.9 10/15/2016 0416   PROT 6.4 (L) 04/18/2015  0120   ALBUMIN 2.8 (L) 04/18/2015 0120   AST 28 04/18/2015 0120    ALT 33 04/18/2015 0120   ALKPHOS 183 (H) 04/18/2015 0120   BILITOT 0.7 04/18/2015 0120   GFRNONAA >60 10/15/2016 0416   GFRAA >60 10/15/2016 0416    GFR Estimated Creatinine Clearance: 79.9 mL/min (by C-G formula based on SCr of 0.64 mg/dL). No results for input(s): LIPASE, AMYLASE in the last 72 hours. No results for input(s): CKTOTAL, CKMB, CKMBINDEX, TROPONINI in the last 72 hours. Invalid input(s): POCBNP No results for input(s): DDIMER in the last 72 hours. No results for input(s): HGBA1C in the last 72 hours. No results for input(s): CHOL, HDL, LDLCALC, TRIG, CHOLHDL, LDLDIRECT in the last 72 hours. No results for input(s): TSH, T4TOTAL, T3FREE, THYROIDAB in the last 72 hours.  Invalid input(s): FREET3 No results for input(s): VITAMINB12, FOLATE, FERRITIN, TIBC, IRON, RETICCTPCT in the last 72 hours. Coags: No results for input(s): INR in the last 72 hours.  Invalid input(s): PT Microbiology: Recent Results (from the past 240 hour(s))  Culture, blood (routine x 2)     Status: None   Collection Time: 10/09/16 12:55 AM  Result Value Ref Range Status   Specimen Description BLOOD RIGHT HAND  Final   Special Requests IN PEDIATRIC BOTTLE 3ML  Final   Culture NO GROWTH 5 DAYS  Final   Report Status 10/14/2016 FINAL  Final  Culture, blood (routine x 2)     Status: None   Collection Time: 10/09/16  6:08 AM  Result Value Ref Range Status   Specimen Description BLOOD RIGHT HAND  Final   Special Requests BOTTLES DRAWN AEROBIC AND ANAEROBIC  5CC  Final   Culture NO GROWTH 5 DAYS  Final   Report Status 10/14/2016 FINAL  Final  MRSA PCR Screening     Status: None   Collection Time: 10/09/16  9:48 AM  Result Value Ref Range Status   MRSA by PCR NEGATIVE NEGATIVE Final    Comment:        The GeneXpert MRSA Assay (FDA approved for NASAL specimens only), is one component of a comprehensive MRSA colonization surveillance program. It is not intended to diagnose MRSA infection  nor to guide or monitor treatment for MRSA infections.   Fungus Culture With Stain     Status: None (Preliminary result)   Collection Time: 10/09/16  2:10 PM  Result Value Ref Range Status   Fungus Stain Final report  Final    Comment: (NOTE) Performed At: [facility] [place], [place] [id number] Lindon Romp MD [id number]    Fungus (Mycology) Culture PENDING  Incomplete   Fungal Source TISSUE  Final    Comment: RIGHT ARM   Aerobic/Anaerobic Culture (surgical/deep wound)     Status: None   Collection Time: 10/09/16  2:10 PM  Result Value Ref Range Status   Specimen Description TISSUE RIGHT ARM  Final   Special Requests PATIENT ON FOLLOWING VANC  Final   Gram Stain   Final    MODERATE WBC PRESENT, PREDOMINANTLY PMN NO ORGANISMS SEEN    Culture   Final    RARE PSEUDOMONAS AERUGINOSA RARE KLEBSIELLA OXYTOCA CRITICAL RESULT CALLED TO, READ BACK BY AND VERIFIED WITH: T PHILLIP,RN AT 1036 10/10/16 BY L BENFIELD CONCERNING GROWTH ON CULTURE NO ANAEROBES ISOLATED    Report Status 10/14/2016 FINAL  Final   Organism ID, Bacteria PSEUDOMONAS AERUGINOSA  Final   Organism ID, Bacteria KLEBSIELLA OXYTOCA  Final  Susceptibility   Klebsiella oxytoca - MIC*    AMPICILLIN >=32 RESISTANT Resistant     CEFAZOLIN >=64 RESISTANT Resistant     CEFEPIME <=1 SENSITIVE Sensitive     CEFTAZIDIME <=1 SENSITIVE Sensitive     CEFTRIAXONE <=1 SENSITIVE Sensitive     CIPROFLOXACIN <=0.25 SENSITIVE Sensitive     GENTAMICIN <=1 SENSITIVE Sensitive     IMIPENEM <=0.25 SENSITIVE Sensitive     TRIMETH/SULFA <=20 SENSITIVE Sensitive     AMPICILLIN/SULBACTAM 16 INTERMEDIATE Intermediate     PIP/TAZO 16 SENSITIVE Sensitive     Extended ESBL NEGATIVE Sensitive     * RARE KLEBSIELLA OXYTOCA   Pseudomonas aeruginosa - MIC*    CEFTAZIDIME 2 SENSITIVE Sensitive     CIPROFLOXACIN <=0.25 SENSITIVE Sensitive     GENTAMICIN <=1 SENSITIVE Sensitive     IMIPENEM 2 SENSITIVE  Sensitive     PIP/TAZO 8 SENSITIVE Sensitive     CEFEPIME <=1 SENSITIVE Sensitive     * RARE PSEUDOMONAS AERUGINOSA  Acid Fast Smear (AFB)     Status: None   Collection Time: 10/09/16  2:10 PM  Result Value Ref Range Status   AFB Specimen Processing Comment  Final    Comment: Tissue Grinding and Digestion/Decontamination   Acid Fast Smear Negative  Final    Comment: (NOTE) Performed At: [facility] [place], [place] [id number] Lindon Romp MD [id number]    Source (AFB) TISSUE  Final    Comment: RIGHT ARM   Fungus Culture Result     Status: None   Collection Time: 10/09/16  2:10 PM  Result Value Ref Range Status   Result 1 Comment  Final    Comment: (NOTE) KOH/Calcofluor preparation:  no fungus observed. Performed At: [facility] [place], [place] [id number] Lindon Romp MD [id number]     FURTHER DISCHARGE INSTRUCTIONS:  Get Medicines reviewed and adjusted: Please take all your medications with you for your next visit with your Primary MD  Laboratory/radiological data: Please request your Primary MD to go over all hospital tests and procedure/radiological results at the follow up, please ask your Primary MD to get all Hospital records sent to his/her office.  In some cases, they will be blood work, cultures and biopsy results pending at the time of your discharge. Please request that your primary care M.D. goes through all the records of your hospital data and follows up on these results.  Also Note the following: If you experience worsening of your admission symptoms, develop shortness of breath, life threatening emergency, suicidal or homicidal thoughts you must seek medical attention immediately by calling 911 or calling your MD immediately  if symptoms less severe.  You must read complete instructions/literature along with all the possible adverse reactions/side effects for all the Medicines you take and that  have been prescribed to you. Take any new Medicines after you have completely understood and accpet all the possible adverse reactions/side effects.   Do not drive when taking Pain medications or sleeping medications (Benzodaizepines)  Do not take more than prescribed Pain, Sleep and Anxiety Medications. It is not advisable to combine anxiety,sleep and pain medications without talking with your primary care practitioner  Special Instructions: If you have smoked or chewed Tobacco  in the last 2 yrs please stop smoking, stop any regular Alcohol  and or any Recreational drug use.  Wear Seat belts while driving.  Please note: You were cared for by a hospitalist during your hospital stay.  Once you are discharged, your primary care physician will handle any further medical issues. Please note that NO REFILLS for any discharge medications will be authorized once you are discharged, as it is imperative that you return to your primary care physician (or establish a relationship with a primary care physician if you do not have one) for your post hospital discharge needs so that they can reassess your need for medications and monitor your lab values.  Total Time spent coordinating discharge including counseling, education and face to face time equals 45 minutes.  SignedOren Binet 10/16/2016 7:22 AM

## 2016-10-16 NOTE — Care Management Important Message (Signed)
Important Message  Patient Details  Name: Andrew [name] MRN: [id number] Date of Birth: 11-20-1945   Medicare Important Message Given:  Yes    Nathen May 10/16/2016, 12:40 PM

## 2016-10-19 ENCOUNTER — Emergency Department (HOSPITAL_COMMUNITY)
Admission: EM | Admit: 2016-10-19 | Discharge: 2016-10-19 | Disposition: A | Discharge status: Home / Self Care | Payer: Medicare Other | Attending: Emergency Medicine | Admitting: Emergency Medicine

## 2016-10-19 ENCOUNTER — Encounter (HOSPITAL_COMMUNITY): Payer: Self-pay | Admitting: Emergency Medicine

## 2016-10-19 DIAGNOSIS — Z794 Long term (current) use of insulin: Secondary | ICD-10-CM | POA: Diagnosis not present

## 2016-10-19 DIAGNOSIS — R531 Weakness: Secondary | ICD-10-CM | POA: Insufficient documentation

## 2016-10-19 DIAGNOSIS — Z79899 Other long term (current) drug therapy: Secondary | ICD-10-CM | POA: Insufficient documentation

## 2016-10-19 DIAGNOSIS — Z7982 Long term (current) use of aspirin: Secondary | ICD-10-CM | POA: Diagnosis not present

## 2016-10-19 DIAGNOSIS — I5032 Chronic diastolic (congestive) heart failure: Secondary | ICD-10-CM | POA: Insufficient documentation

## 2016-10-19 DIAGNOSIS — F17210 Nicotine dependence, cigarettes, uncomplicated: Secondary | ICD-10-CM | POA: Insufficient documentation

## 2016-10-19 DIAGNOSIS — Z4801 Encounter for change or removal of surgical wound dressing: Secondary | ICD-10-CM | POA: Diagnosis not present

## 2016-10-19 DIAGNOSIS — Z5189 Encounter for other specified aftercare: Secondary | ICD-10-CM

## 2016-10-19 DIAGNOSIS — E119 Type 2 diabetes mellitus without complications: Secondary | ICD-10-CM | POA: Diagnosis not present

## 2016-10-19 DIAGNOSIS — I110 Hypertensive heart disease with heart failure: Secondary | ICD-10-CM | POA: Insufficient documentation

## 2016-10-19 DIAGNOSIS — M79621 Pain in right upper arm: Secondary | ICD-10-CM | POA: Insufficient documentation

## 2016-10-19 LAB — BASIC METABOLIC PANEL
ANION GAP: 10 (ref 5–15)
BUN: 20 mg/dL (ref 6–20)
CHLORIDE: 98 mmol/L — AB (ref 101–111)
CO2: 27 mmol/L (ref 22–32)
Calcium: 9 mg/dL (ref 8.9–10.3)
Creatinine, Ser: 1.02 mg/dL (ref 0.61–1.24)
GFR calc non Af Amer: >60 mL/min (ref >60)
Glucose, Bld: 205 mg/dL — ABNORMAL HIGH (ref 65–99)
POTASSIUM: 4.6 mmol/L (ref 3.5–5.1)
Sodium: 135 mmol/L (ref 135–145)

## 2016-10-19 LAB — CBC WITH DIFFERENTIAL/PLATELET
BASOS PCT: 1 %
Basophils Absolute: 0.2 10*3/uL — ABNORMAL HIGH (ref 0.0–0.1)
EOS ABS: 1.2 10*3/uL — AB (ref 0.0–0.7)
EOS PCT: 8 %
HCT: 26.8 % — ABNORMAL LOW (ref 39.0–52.0)
HEMOGLOBIN: 8.6 g/dL — AB (ref 13.0–17.0)
Lymphocytes Relative: 11 %
Lymphs Abs: 1.7 10*3/uL (ref 0.7–4.0)
MCH: 29 pg (ref 26.0–34.0)
MCHC: 32.1 g/dL (ref 30.0–36.0)
MCV: 90.2 fL (ref 78.0–100.0)
MONOS PCT: 10 %
Monocytes Absolute: 1.4 10*3/uL — ABNORMAL HIGH (ref 0.1–1.0)
NEUTROS PCT: 70 %
Neutro Abs: 10.5 10*3/uL — ABNORMAL HIGH (ref 1.7–7.7)
PLATELETS: 442 10*3/uL — AB (ref 150–400)
RBC: 2.97 MIL/uL — ABNORMAL LOW (ref 4.22–5.81)
RDW: 15.4 % (ref 11.5–15.5)
WBC: 15 10*3/uL — ABNORMAL HIGH (ref 4.0–10.5)

## 2016-10-19 LAB — TYPE AND SCREEN
ABO/RH(D): A POS
Antibody Screen: NEGATIVE

## 2016-10-19 NOTE — ED Triage Notes (Signed)
Pt here for wounds to right arm bleeding; pt had wound vac in place and started bleeding today with home health RN

## 2016-10-19 NOTE — ED Notes (Signed)
Pt wheeled back to room, assisted into gown. Placed pt on bp cuff and o2 monitor.
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
## 2016-10-19 NOTE — ED Provider Notes (Signed)
I have personally seen and examined the patient. I have reviewed the documentation on PMH/FH/Soc Hx. I have discussed the plan of care with the resident and patient.  I have reviewed and agree with the resident's documentation. Please see associated encounter note.    Fatima Blank, MD 10/19/16 [phone]

## 2016-11-10 LAB — FUNGAL ORGANISM REFLEX

## 2016-11-10 LAB — FUNGUS CULTURE WITH STAIN

## 2016-11-10 LAB — FUNGUS CULTURE RESULT

## 2016-11-11 ENCOUNTER — Inpatient Hospital Stay: Payer: Self-pay | Admitting: Internal Medicine

## 2016-11-17 ENCOUNTER — Ambulatory Visit: Payer: Self-pay | Admitting: Plastic Surgery

## 2016-11-17 ENCOUNTER — Encounter (HOSPITAL_COMMUNITY): Payer: Self-pay | Admitting: *Deleted

## 2016-11-17 DIAGNOSIS — S51001A Unspecified open wound of right elbow, initial encounter: Secondary | ICD-10-CM

## 2016-11-17 NOTE — Progress Notes (Signed)
Mr Andrew [name] will not talk on phone, I spoke to his cafe giver , Andrew [name]. Ms Andrew [name] is his neighbor and she goes to his home in the am to make sure he takes his medications and see that he eats.  Ms Andrew [name] reports that patient will not allow her to change dressing and will only allow CBG to be checked once a day.   Patients's PCP is Dr Kathryne Eriksson, he has stopped Lantus and has patient on Toujeo, per Ms Andrew [name] a sliding scale , which he takes in am.  Ms Andrew [name] reported that CBG runs low 100' - 200's.   Patient told Ms Andrew [name] that he will not take medications the morning of surgery and will take them when he gets home.  I asked Ms Andrew [name] to see if he will take Amlodipine and Metoprolol with a sip of water, she said she will try.  Ms Andrew [name] reports that she can drive patient home but ha sto leave by 1:30 PM and that he son gets home after 4:30 PM.  I sent a message to Dr Marla Roe with this information.  I asked Ms Andrew [name] to check CBG am of surgery and if less than 70 to give patient 1/2 cup of cranberry juice and to check CBG 15 minutes later and call pre -op desk.  Ms Andrew [name] said she will try. Patient continues to smoke and will not stop prior to surgery.

## 2016-11-18 ENCOUNTER — Encounter (HOSPITAL_COMMUNITY): Payer: Self-pay | Admitting: Anesthesiology

## 2016-11-18 MED ORDER — CIPROFLOXACIN IN D5W 400 MG/200ML IV SOLN
400.0000 mg | INTRAVENOUS | Status: AC
  Administered 2016-11-19: 400 mg via INTRAVENOUS
  Filled 2016-11-18: qty 200

## 2016-11-18 NOTE — Anesthesia Preprocedure Evaluation (Addendum)
Anesthesia Evaluation  Patient identified by MRN, date of birth, ID band Patient awake    Reviewed: Allergy & Precautions, NPO status , Patient's Chart, lab work & pertinent test results  Airway Mallampati: II       Dental  (+) Poor Dentition, Dental Advisory Given   Pulmonary neg pulmonary ROS, Current Smoker,    breath sounds clear to auscultation       Cardiovascular hypertension, Pt. on medications and Pt. on home beta blockers + Peripheral Vascular Disease and +CHF  negative cardio ROS   Rhythm:Regular Rate:Normal     Neuro/Psych PSYCHIATRIC DISORDERS negative neurological ROS     GI/Hepatic negative GI ROS, Neg liver ROS,   Endo/Other  negative endocrine ROSdiabetes, Type 2, Insulin Dependent, Oral Hypoglycemic Agents  Renal/GU Renal disease  negative genitourinary   Musculoskeletal negative musculoskeletal ROS (+)   Abdominal   Peds negative pediatric ROS (+)  Hematology negative hematology ROS (+)   Anesthesia Other Findings - HLD - AAA  Day of surgery medications reviewed with the patient.  Reproductive/Obstetrics                            EKG: Sinus Tachycardia  Echo: - Left ventricle: The cavity size was normal. Wall thickness was increased in a pattern of moderate to severe LVH. Systolic function was normal. The estimated ejection fraction was in the range of 60% to 65%. Wall motion was normal; there were no regional wall motion abnormalities. Doppler parameters are consistent with abnormal left ventricular relaxation (grade 1 diastolic dysfunction). - Aortic valve: There was mild stenosis. Valve area (VTI): 1.84   cm^2. Valve area (Vmax): 1.63 cm^2. Valve area (Vmean): 1.77   cm^2. - Left atrium: The atrium was moderately dilated. - Right atrium: The atrium was moderately dilated. - Atrial septum: There was an atrial septal aneurysm. - Line: A venous catheter was  visualized in the superior vena cava, with its tip in the right atrium. No abnormal features noted.   Anesthesia Physical Anesthesia Plan  ASA: III  Anesthesia Plan: General   Post-op Pain Management:    Induction: Intravenous  Airway Management Planned: LMA  Additional Equipment:   Intra-op Plan:   Post-operative Plan: Extubation in OR  Informed Consent: I have reviewed the patients History and Physical, chart, labs and discussed the procedure including the risks, benefits and alternatives for the proposed anesthesia with the patient or authorized representative who has indicated his/her understanding and acceptance.   Dental advisory given  Plan Discussed with: CRNA  Anesthesia Plan Comments:         Anesthesia Quick Evaluation

## 2016-11-19 ENCOUNTER — Ambulatory Visit (HOSPITAL_COMMUNITY): Payer: Medicare Other | Admitting: Certified Registered Nurse Anesthetist

## 2016-11-19 ENCOUNTER — Encounter (HOSPITAL_COMMUNITY)
Admission: RE | Disposition: A | Discharge status: Home / Self Care | Payer: Self-pay | Source: Ambulatory Visit | Attending: Plastic Surgery

## 2016-11-19 ENCOUNTER — Ambulatory Visit (HOSPITAL_COMMUNITY)
Admission: RE | Admit: 2016-11-19 | Discharge: 2016-11-19 | Disposition: A | Discharge status: Home / Self Care | Payer: Medicare Other | Source: Ambulatory Visit | Attending: Plastic Surgery | Admitting: Plastic Surgery

## 2016-11-19 ENCOUNTER — Encounter (HOSPITAL_COMMUNITY): Payer: Self-pay | Admitting: *Deleted

## 2016-11-19 DIAGNOSIS — I10 Essential (primary) hypertension: Secondary | ICD-10-CM | POA: Insufficient documentation

## 2016-11-19 DIAGNOSIS — H5461 Unqualified visual loss, right eye, normal vision left eye: Secondary | ICD-10-CM | POA: Diagnosis not present

## 2016-11-19 DIAGNOSIS — F17210 Nicotine dependence, cigarettes, uncomplicated: Secondary | ICD-10-CM | POA: Diagnosis not present

## 2016-11-19 DIAGNOSIS — Z880 Allergy status to penicillin: Secondary | ICD-10-CM | POA: Diagnosis not present

## 2016-11-19 DIAGNOSIS — X58XXXD Exposure to other specified factors, subsequent encounter: Secondary | ICD-10-CM | POA: Insufficient documentation

## 2016-11-19 DIAGNOSIS — Z794 Long term (current) use of insulin: Secondary | ICD-10-CM | POA: Diagnosis not present

## 2016-11-19 DIAGNOSIS — S41101D Unspecified open wound of right upper arm, subsequent encounter: Secondary | ICD-10-CM | POA: Insufficient documentation

## 2016-11-19 DIAGNOSIS — E1140 Type 2 diabetes mellitus with diabetic neuropathy, unspecified: Secondary | ICD-10-CM | POA: Diagnosis not present

## 2016-11-19 DIAGNOSIS — S41101A Unspecified open wound of right upper arm, initial encounter: Secondary | ICD-10-CM

## 2016-11-19 DIAGNOSIS — Z01818 Encounter for other preprocedural examination: Secondary | ICD-10-CM | POA: Insufficient documentation

## 2016-11-19 DIAGNOSIS — J42 Unspecified chronic bronchitis: Secondary | ICD-10-CM | POA: Diagnosis not present

## 2016-11-19 DIAGNOSIS — E785 Hyperlipidemia, unspecified: Secondary | ICD-10-CM | POA: Insufficient documentation

## 2016-11-19 DIAGNOSIS — Z7982 Long term (current) use of aspirin: Secondary | ICD-10-CM | POA: Diagnosis not present

## 2016-11-19 DIAGNOSIS — S51001A Unspecified open wound of right elbow, initial encounter: Secondary | ICD-10-CM

## 2016-11-19 HISTORY — PX: INCISION AND DRAINAGE OF WOUND: SHX1803

## 2016-11-19 HISTORY — DX: Bursopathy, unspecified: M71.9

## 2016-11-19 HISTORY — PX: APPLICATION OF A-CELL OF EXTREMITY: SHX6303

## 2016-11-19 HISTORY — DX: Cellulitis, unspecified: L03.90

## 2016-11-19 HISTORY — PX: APPLICATION OF WOUND VAC: SHX5189

## 2016-11-19 HISTORY — DX: Type 2 diabetes mellitus with diabetic neuropathy, unspecified: E11.40

## 2016-11-19 LAB — SURGICAL PCR SCREEN
MRSA, PCR: NEGATIVE
Staphylococcus aureus: NEGATIVE

## 2016-11-19 LAB — CBC
HCT: 34.6 % — ABNORMAL LOW (ref 39.0–52.0)
Hemoglobin: 11 g/dL — ABNORMAL LOW (ref 13.0–17.0)
MCH: 28 pg (ref 26.0–34.0)
MCHC: 31.8 g/dL (ref 30.0–36.0)
MCV: 88 fL (ref 78.0–100.0)
PLATELETS: 371 10*3/uL (ref 150–400)
RBC: 3.93 MIL/uL — ABNORMAL LOW (ref 4.22–5.81)
RDW: 14.3 % (ref 11.5–15.5)
WBC: 10.4 10*3/uL (ref 4.0–10.5)

## 2016-11-19 LAB — BASIC METABOLIC PANEL
Anion gap: 9 (ref 5–15)
BUN: 23 mg/dL — AB (ref 6–20)
CHLORIDE: 100 mmol/L — AB (ref 101–111)
CO2: 28 mmol/L (ref 22–32)
CREATININE: 0.61 mg/dL (ref 0.61–1.24)
Calcium: 9.6 mg/dL (ref 8.9–10.3)
GFR calc Af Amer: >60 mL/min (ref >60)
GLUCOSE: 152 mg/dL — AB (ref 65–99)
POTASSIUM: 3.9 mmol/L (ref 3.5–5.1)
SODIUM: 137 mmol/L (ref 135–145)

## 2016-11-19 LAB — GLUCOSE, CAPILLARY
GLUCOSE-CAPILLARY: 178 mg/dL — AB (ref 65–99)
Glucose-Capillary: 126 mg/dL — ABNORMAL HIGH (ref 65–99)

## 2016-11-19 SURGERY — APPLICATION OF A-CELL OF EXTREMITY
Anesthesia: General | Site: Elbow | Laterality: Right

## 2016-11-19 MED ORDER — EPHEDRINE 5 MG/ML INJ
INTRAVENOUS | Status: AC
  Filled 2016-11-19: qty 10

## 2016-11-19 MED ORDER — SODIUM CHLORIDE 0.9% FLUSH: 3.0000 mL | Freq: Two times a day (BID) | INTRAVENOUS | Status: DC

## 2016-11-19 MED ORDER — 0.9 % SODIUM CHLORIDE (POUR BTL) OPTIME
TOPICAL | Status: DC | PRN
Start: 2016-11-19 — End: 2016-11-19
  Administered 2016-11-19: 1000 mL

## 2016-11-19 MED ORDER — FENTANYL CITRATE (PF) 100 MCG/2ML IJ SOLN
INTRAMUSCULAR | Status: DC | PRN
  Administered 2016-11-19 (×2): 25 ug via INTRAVENOUS
  Administered 2016-11-19: 50 ug via INTRAVENOUS

## 2016-11-19 MED ORDER — METOPROLOL SUCCINATE ER 100 MG PO TB24
100.0000 mg | ORAL_TABLET | Freq: Every day | ORAL | Status: AC
  Administered 2016-11-19: 100 mg via ORAL
  Filled 2016-11-19: qty 1

## 2016-11-19 MED ORDER — SODIUM CHLORIDE 0.9 % IV SOLN: 250.0000 mL | INTRAVENOUS | Status: DC | PRN

## 2016-11-19 MED ORDER — PROPOFOL 10 MG/ML IV BOLUS
INTRAVENOUS | Status: AC
  Filled 2016-11-19: qty 40

## 2016-11-19 MED ORDER — PHENYLEPHRINE 40 MCG/ML (10ML) SYRINGE FOR IV PUSH (FOR BLOOD PRESSURE SUPPORT)
PREFILLED_SYRINGE | INTRAVENOUS | Status: DC | PRN
  Administered 2016-11-19: 40 ug via INTRAVENOUS
  Administered 2016-11-19: 80 ug via INTRAVENOUS
  Administered 2016-11-19: 40 ug via INTRAVENOUS
  Administered 2016-11-19: 80 ug via INTRAVENOUS

## 2016-11-19 MED ORDER — LACTATED RINGERS IV SOLN: INTRAVENOUS | Status: DC

## 2016-11-19 MED ORDER — FENTANYL CITRATE (PF) 100 MCG/2ML IJ SOLN: 25.0000 ug | INTRAMUSCULAR | Status: DC | PRN

## 2016-11-19 MED ORDER — LIDOCAINE 2% (20 MG/ML) 5 ML SYRINGE
INTRAMUSCULAR | Status: DC | PRN
  Administered 2016-11-19: 80 mg via INTRAVENOUS

## 2016-11-19 MED ORDER — MINERAL OIL LIGHT 100 % EX OIL
TOPICAL_OIL | CUTANEOUS | Status: AC
  Filled 2016-11-19: qty 25

## 2016-11-19 MED ORDER — CHLORHEXIDINE GLUCONATE CLOTH 2 % EX PADS: 6.0000 | MEDICATED_PAD | Freq: Once | CUTANEOUS | Status: DC

## 2016-11-19 MED ORDER — PROPOFOL 10 MG/ML IV BOLUS
INTRAVENOUS | Status: DC | PRN
  Administered 2016-11-19: 150 mg via INTRAVENOUS

## 2016-11-19 MED ORDER — ONDANSETRON HCL 4 MG/2ML IJ SOLN
INTRAMUSCULAR | Status: AC
  Filled 2016-11-19: qty 2

## 2016-11-19 MED ORDER — EPINEPHRINE PF 1 MG/ML IJ SOLN
INTRAMUSCULAR | Status: AC
  Filled 2016-11-19: qty 1

## 2016-11-19 MED ORDER — MEPERIDINE HCL 25 MG/ML IJ SOLN: 6.2500 mg | INTRAMUSCULAR | Status: DC | PRN

## 2016-11-19 MED ORDER — BUPIVACAINE HCL (PF) 0.25 % IJ SOLN
INTRAMUSCULAR | Status: AC
  Filled 2016-11-19: qty 30

## 2016-11-19 MED ORDER — FENTANYL CITRATE (PF) 250 MCG/5ML IJ SOLN
INTRAMUSCULAR | Status: AC
  Filled 2016-11-19: qty 5

## 2016-11-19 MED ORDER — ACETAMINOPHEN 325 MG PO TABS: 650.0000 mg | ORAL_TABLET | ORAL | Status: DC | PRN

## 2016-11-19 MED ORDER — ONDANSETRON HCL 4 MG/2ML IJ SOLN
INTRAMUSCULAR | Status: DC | PRN
  Administered 2016-11-19: 4 mg via INTRAVENOUS

## 2016-11-19 MED ORDER — EPHEDRINE SULFATE-NACL 50-0.9 MG/10ML-% IV SOSY
PREFILLED_SYRINGE | INTRAVENOUS | Status: DC | PRN
  Administered 2016-11-19 (×6): 5 mg via INTRAVENOUS

## 2016-11-19 MED ORDER — LIDOCAINE-EPINEPHRINE 2 %-1:100000 IJ SOLN
INTRAMUSCULAR | Status: AC
  Filled 2016-11-19: qty 1

## 2016-11-19 MED ORDER — LACTATED RINGERS IV SOLN
INTRAVENOUS | Status: DC | PRN
  Administered 2016-11-19 (×2): via INTRAVENOUS

## 2016-11-19 MED ORDER — PROMETHAZINE HCL 25 MG/ML IJ SOLN: 6.2500 mg | INTRAMUSCULAR | Status: DC | PRN

## 2016-11-19 MED ORDER — ACETAMINOPHEN 650 MG RE SUPP: 650.0000 mg | RECTAL | Status: DC | PRN

## 2016-11-19 MED ORDER — SODIUM CHLORIDE 0.9% FLUSH: 3.0000 mL | INTRAVENOUS | Status: DC | PRN

## 2016-11-19 MED ORDER — SODIUM CHLORIDE 0.9 % IR SOLN
Status: DC | PRN
Start: 2016-11-19 — End: 2016-11-19
  Administered 2016-11-19: 500 mL

## 2016-11-19 MED ORDER — HYDROCODONE-ACETAMINOPHEN 5-325 MG PO TABS: 1.0000 | ORAL_TABLET | Freq: Two times a day (BID) | ORAL | 0 refills | Status: DC | PRN

## 2016-11-19 MED ORDER — PHENYLEPHRINE 40 MCG/ML (10ML) SYRINGE FOR IV PUSH (FOR BLOOD PRESSURE SUPPORT)
PREFILLED_SYRINGE | INTRAVENOUS | Status: AC
  Filled 2016-11-19: qty 10

## 2016-11-19 SURGICAL SUPPLY — 64 items
APL SKNCLS STERI-STRIP NONHPOA (GAUZE/BANDAGES/DRESSINGS)
BANDAGE ACE 4X5 VEL STRL LF (GAUZE/BANDAGES/DRESSINGS) ×4 IMPLANT
BANDAGE ACE 6X5 VEL STRL LF (GAUZE/BANDAGES/DRESSINGS) IMPLANT
BENZOIN TINCTURE PRP APPL 2/3 (GAUZE/BANDAGES/DRESSINGS) ×2 IMPLANT
BLADE 10 SAFETY STRL DISP (BLADE) ×1 IMPLANT
BLADE CLIPPER SURG (BLADE) IMPLANT
BLADE DERMATOME II (BLADE) ×3 IMPLANT
BNDG GAUZE ELAST 4 BULKY (GAUZE/BANDAGES/DRESSINGS) ×4 IMPLANT
CANISTER SUCT 3000ML PPV (MISCELLANEOUS) ×2 IMPLANT
CANISTER WOUND CARE 500ML ATS (WOUND CARE) ×3 IMPLANT
COVER SURGICAL LIGHT HANDLE (MISCELLANEOUS) ×3 IMPLANT
DERMACARRIERS GRAFT 1 TO 1.5 (DISPOSABLE) ×3
DRAPE INCISE IOBAN 66X45 STRL (DRAPES) ×3 IMPLANT
DRAPE ORTHO SPLIT 77X108 STRL (DRAPES) ×6
DRAPE PROXIMA HALF (DRAPES) ×3 IMPLANT
DRAPE SURG ORHT 6 SPLT 77X108 (DRAPES) ×4 IMPLANT
DRSG ADAPTIC 3X8 NADH LF (GAUZE/BANDAGES/DRESSINGS) IMPLANT
DRSG OPSITE 6X11 MED (GAUZE/BANDAGES/DRESSINGS) IMPLANT
DRSG PAD ABDOMINAL 8X10 ST (GAUZE/BANDAGES/DRESSINGS) ×3 IMPLANT
DRSG TELFA 3X8 NADH (GAUZE/BANDAGES/DRESSINGS) ×6 IMPLANT
DRSG VAC ATS LRG SENSATRAC (GAUZE/BANDAGES/DRESSINGS) IMPLANT
DRSG VAC ATS MED SENSATRAC (GAUZE/BANDAGES/DRESSINGS) IMPLANT
DRSG VAC ATS SM SENSATRAC (GAUZE/BANDAGES/DRESSINGS) IMPLANT
ELECT CAUTERY BLADE 6.4 (BLADE) ×3 IMPLANT
ELECT REM PT RETURN 9FT ADLT (ELECTROSURGICAL) ×3
ELECTRODE REM PT RTRN 9FT ADLT (ELECTROSURGICAL) ×1 IMPLANT
FILTER STRAW FLUID ASPIR (MISCELLANEOUS) ×3 IMPLANT
GAUZE SPONGE 4X4 12PLY STRL (GAUZE/BANDAGES/DRESSINGS) ×3 IMPLANT
GAUZE XEROFORM 5X9 LF (GAUZE/BANDAGES/DRESSINGS) ×3 IMPLANT
GEL ULTRASOUND 20GR AQUASONIC (MISCELLANEOUS) IMPLANT
GLOVE BIO SURGEON STRL SZ 6.5 (GLOVE) ×6 IMPLANT
GOWN STRL REUS W/ TWL LRG LVL3 (GOWN DISPOSABLE) ×6 IMPLANT
GOWN STRL REUS W/TWL LRG LVL3 (GOWN DISPOSABLE) ×9
GRAFT DERMACARRIERS 1 TO 1.5 (DISPOSABLE) ×2 IMPLANT
HANDPIECE INTERPULSE COAX TIP (DISPOSABLE)
KIT BASIN OR (CUSTOM PROCEDURE TRAY) ×3 IMPLANT
KIT ROOM TURNOVER OR (KITS) ×3 IMPLANT
MATRIX SURGICAL PSM 10X15CM (Tissue) ×2 IMPLANT
MICROMATRIX 1000MG (Tissue) ×6 IMPLANT
NDL SPNL 18GX3.5 QUINCKE PK (NEEDLE) ×1 IMPLANT
NEEDLE SPNL 18GX3.5 QUINCKE PK (NEEDLE) ×3 IMPLANT
NS IRRIG 1000ML POUR BTL (IV SOLUTION) ×3 IMPLANT
PACK GENERAL/GYN (CUSTOM PROCEDURE TRAY) ×3 IMPLANT
PACK ORTHO EXTREMITY (CUSTOM PROCEDURE TRAY) ×3 IMPLANT
PAD ARMBOARD 7.5X6 YLW CONV (MISCELLANEOUS) ×6 IMPLANT
PAD DRESSING TELFA 3X8 NADH (GAUZE/BANDAGES/DRESSINGS) ×2 IMPLANT
SET HNDPC FAN SPRY TIP SCT (DISPOSABLE) IMPLANT
SOLUTION PARTIC MCRMTRX 1000MG (Tissue) ×2 IMPLANT
STAPLER VISISTAT 35W (STAPLE) ×3 IMPLANT
STOCKINETTE IMPERVIOUS 9X36 MD (GAUZE/BANDAGES/DRESSINGS) IMPLANT
STOCKINETTE IMPERVIOUS LG (DRAPES) IMPLANT
SUT CHROMIC 4 0 PS 2 18 (SUTURE) IMPLANT
SUT SILK 4 0 P 3 (SUTURE) IMPLANT
SUT SILK 4 0 PS 2 (SUTURE) IMPLANT
SUT VIC AB 5-0 P-3 18XBRD (SUTURE) IMPLANT
SUT VIC AB 5-0 P3 18 (SUTURE)
SUT VIC AB 5-0 PS2 18 (SUTURE) ×10 IMPLANT
SYR 3ML 25GX5/8 SAFETY (SYRINGE) ×1 IMPLANT
SYR CONTROL 10ML LL (SYRINGE) ×3 IMPLANT
TOWEL OR 17X24 6PK STRL BLUE (TOWEL DISPOSABLE) ×3 IMPLANT
TOWEL OR 17X26 10 PK STRL BLUE (TOWEL DISPOSABLE) ×3 IMPLANT
TUBE CONNECTING 12X1/4 (SUCTIONS) ×3 IMPLANT
UNDERPAD 30X30 (UNDERPADS AND DIAPERS) ×3 IMPLANT
YANKAUER SUCT BULB TIP NO VENT (SUCTIONS) ×3 IMPLANT

## 2016-11-19 NOTE — H&P (Signed)
Andrew [name] is an 71 y.o. male.   Chief Complaint: right arm wound HPI: The patient is a 71 yrs old wm here for treatment of his right arm wound.  He has undergone Acell and VAC placement several weeks ago and present for further treatment.  Past Medical History:  Diagnosis Date  . Blind right eye    S/P shotgun  . Bursitis    right Olecranon  . Chronic bronchitis ([place])   . Diabetic neuropathy ([place])   . Hyperlipemia   . Hypertension   . 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 Type II diabetes mellitus ([place])   . Wound cellulitis    right elbow open    Past Surgical History:  Procedure Laterality Date  . APPLICATION OF A-CELL OF EXTREMITY Right 10/15/2016   Procedure: APPLICATION OF A-CELL OF EXTREMITY;  Surgeon: Loel Lofty Charday Capetillo, DO;  Location: [facility];  Service: Plastics;  Laterality: Right;  . APPLICATION OF WOUND VAC Right 10/09/2016   Procedure: APPLICATION OF WOUND VAC;  Surgeon: Leandrew Koyanagi, MD;  Location: [facility];  Service: Orthopedics;  Laterality: Right;  . APPLICATION OF WOUND VAC Right 10/15/2016   Procedure: WOUND VAC CHANGE;  Surgeon: Loel Lofty Haly Feher, DO;  Location: [facility];  Service: Plastics;  Laterality: Right;  . I&D EXTREMITY Right 10/09/2016   Procedure: IRRIGATION AND DEBRIDEMENT EXTREMITY RIGHT ARM WOUND;  Surgeon: Leandrew Koyanagi, MD;  Location: [place];  Service: Orthopedics;  Laterality: Right;  . I&D EXTREMITY N/A 10/11/2016   Procedure: IRRIGATION AND DEBRIDEMENT EXTREMITY RIGHT ARM WOUND;  Surgeon: Leandrew Koyanagi, MD;  Location: [facility];  Service: Orthopedics;  Laterality: N/A;  . I&D EXTREMITY Right 10/15/2016   Procedure: IRRIGATION AND DEBRIDEMENT EXTREMITY/RIGHT ELBOW;  Surgeon: Loel Lofty Esmond Hinch, DO;  Location: [facility];  Service: Plastics;  Laterality: Right;  . INCISION AND DRAINAGE OF WOUND Right 10/09/2016   arm    Family History  Problem Relation Age of Onset  . Diabetes Mellitus II Mother   . Diabetes Mellitus II  Father   . Diabetes Mellitus II Sister   . Diabetes Mellitus II Sister    Social History:  reports that he has been smoking Cigarettes.  He has a 94.50 pack-year smoking history. He has quit using smokeless tobacco. His smokeless tobacco use included Chew. He reports that he drinks alcohol. He reports that he does not use drugs.  Allergies:  Allergies  Allergen Reactions  . Penicillins Rash and Other (See Comments)    UNSPECIFIED REACTION DESCRIPTION Tolerated Zosyn Jan 2018  Possibly rash? Has patient had a PCN reaction causing immediate rash, facial/tongue/throat swelling, SOB or lightheadedness with hypotension: YES Has patient had a PCN reaction causing severe rash involving mucus membranes or skin necrosis:NO Has patient had a PCN reaction that required hospitalization NO Has patient had a PCN reaction occurring within the last 10 years:NO If all of the above answers are "NO", then may proceed with Cephal    Medications Prior to Admission  Medication Sig Dispense Refill  . amLODipine (NORVASC) 10 MG tablet Take 1 tablet (10 mg total) by mouth daily. 30 tablet 0  . aspirin 325 MG tablet Take 325 mg by mouth daily.    . ciprofloxacin (CIPRO) 750 MG tablet Take 1 tablet (750 mg total) by mouth 2 (two) times daily. 72 tablet 0  . feeding supplement, ENSURE ENLIVE, (ENSURE ENLIVE) LIQD Take 237 mLs by mouth daily. (Patient taking differently: Take 237  mLs by mouth 3 (three) times daily between meals. ) 237 mL 12  . furosemide (LASIX) 20 MG tablet Take 20 mg by mouth daily.    Marland Kitchen gabapentin (NEURONTIN) 100 MG capsule Take 100 mg by mouth 3 (three) times daily.    . Insulin Glargine (TOUJEO SOLOSTAR) 300 UNIT/ML SOPN Inject 60 Units into the skin daily.    . metFORMIN (GLUCOPHAGE) 500 MG tablet Take 1,000 mg by mouth 2 (two) times daily with a meal.    . metoprolol succinate (TOPROL-XL) 100 MG 24 hr tablet Take 1 tablet (100 mg total) by mouth daily. Take with or immediately following a  meal. 30 tablet 2  . simvastatin (ZOCOR) 40 MG tablet Take 40 mg by mouth daily.    . sitaGLIPtin (JANUVIA) 100 MG tablet Take 100 mg by mouth daily.    Marland Kitchen thiamine 100 MG tablet Take 1 tablet (100 mg total) by mouth daily. 30 tablet 2  . insulin aspart (NOVOLOG) 100 UNIT/ML injection Sliding scale insulin Less than 70 initiate hypoglycemia protocol 70-120  0 units 120-150 1 unit 151-200 2 units 201-250 3 units 251-300 5 units 301-350 7 units 351-400 9 units  Greater than 400 call MD (Patient not taking: Reported on 11/19/2016) 10 mL 11  . insulin glargine (LANTUS) 100 UNIT/ML injection Inject 0.16 mLs (16 Units total) into the skin at bedtime. (Patient not taking: Reported on 11/19/2016) 10 mL 0  . oxyCODONE-acetaminophen (PERCOCET/ROXICET) 5-325 MG tablet Take 1-2 tablets by mouth every 6 (six) hours as needed for moderate pain or severe pain. 20 tablet 0    Results for orders placed or performed during the hospital encounter of 11/19/16 (from the past 48 hour(s))  Glucose, capillary     Status: Abnormal   Collection Time: 11/19/16  6:21 AM  Result Value Ref Range   Glucose-Capillary 178 (H) 65 - 99 mg/dL   Comment 1 Notify RN    Comment 2 Document in Chart    No results found.  Review of Systems  Constitutional: Negative.   HENT: Negative.   Eyes: Negative.   Respiratory: Negative.   Cardiovascular: Negative.   Gastrointestinal: Negative.   Genitourinary: Negative.   Musculoskeletal: Negative.   Skin: Negative.   Neurological: Negative.   Psychiatric/Behavioral: Negative.     Blood pressure (!) 175/65, pulse 65, temperature 97.7 F (36.5 C), temperature source Oral, resp. rate 16, SpO2 98 %. Physical Exam  Constitutional: He appears well-developed and well-nourished.  HENT:  Head: Normocephalic and atraumatic.  Eyes: Pupils are equal, round, and reactive to light.  Cardiovascular: Normal rate.   Respiratory: Effort normal.  GI: Soft.  Musculoskeletal:        Arms: Neurological: He is alert.  Psychiatric: He has a normal mood and affect. His behavior is normal. Judgment and thought content normal.     Assessment/Plan Plan for skin graft to the right arm if ready otherwise will place Acell and VAC to the wound.  Wallace Going, DO 11/19/2016, 7:16 AM

## 2016-11-19 NOTE — Discharge Instructions (Signed)
Keep VAC on for one week then twice a week VAC changes.

## 2016-11-19 NOTE — Op Note (Signed)
DATE OF OPERATION: 11/19/2016  LOCATION: [facility]  PREOPERATIVE DIAGNOSIS: Right arm wound 12 x 20 x 1 cm  POSTOPERATIVE DIAGNOSIS: Same  PROCEDURE:  1. Right arm partial closure of 4 x 4 cm proximal wound with tissue advancement 2. Excisional debridement of right arm wound 10 x 15 cm 3. Placement of Acell 10 x 15 cm sheet and 2 gm 4. Placement of VAC   SURGEON: Kenric Ginger Sanger Ember Henrikson, DO  EBL: 20 cc  CONDITION: Stable  COMPLICATIONS: None  INDICATION: The patient, Andrew [name], is a 71 y.o. male born on 16-May-1946, is here for treatment of a chronic right arm wound.   PROCEDURE DETAILS:  The patient was seen prior to surgery and marked.  The IV antibiotics were given. The patient was taken to the operating room and given a general anesthetic. A standard time out was performed and all information was confirmed by those in the room. SCDs were placed.   The right arm was prepped and draped in the usual sterile fashion.  The arm was irrigated with antibiotic solution.  The #10 blade was used to debride the soft tissue and skin for the 10 x 15 cm wound.  The bovie was used to obtain hemostasis. The proximal portion of the wound had lax skin medially and laterally.  The area was undermined for 4 cm and advanced to the midline of the arm.  This was closed with 3-0 Monocryl with vertical mattress sutures.  This closed the proximal 4 cm of the wound.  The remaining wound was covered with Acell powder 2 gm and the 10 x 15 cm sheet to the remaining 10 x 15 cm wound.   The sheet was secured with 5-0 Vicryl.  The sorbact was applied with the 5-0 Vicryl.  The VAC sponge was applied with the KY gel.  There was an excellent seal.  The patient was allowed to wake up and taken to recovery room in stable condition at the end of the case. The family was notified at the end of the case.

## 2016-11-19 NOTE — Transfer of Care (Signed)
Immediate Anesthesia Transfer of Care Note  Patient: Andrew [name]  Procedure(s) Performed: Procedure(s): APPLICATION OF A-CELL OF EXTREMITY (Right) APPLICATION OF WOUND VAC (Right) IRRIGATION AND DEBRIDEMENT WOUND (Right)  Patient Location: PACU  Anesthesia Type:General  Level of Consciousness: awake, alert , oriented and patient cooperative  Airway & Oxygen Therapy: Patient Spontanous Breathing and Patient connected to nasal cannula oxygen  Post-op Assessment: Report given to RN, Post -op Vital signs reviewed and stable and Patient moving all extremities X 4  Post vital signs: Reviewed and stable  Last Vitals:  Vitals:   11/19/16 0619  BP: (!) 175/65  Pulse: 65  Resp: 16  Temp: 36.5 C    Last Pain:  Vitals:   11/19/16 0619  TempSrc: Oral      Patients Stated Pain Goal: 3 (82/08/13 8871)  Complications: No apparent anesthesia complications

## 2016-11-19 NOTE — Anesthesia Procedure Notes (Signed)
Procedure Name: LMA Insertion Date/Time: 11/19/2016 7:49 AM Performed by: Everlean Cherry A Pre-anesthesia Checklist: Patient identified, Emergency Drugs available, Patient being monitored and Suction available Patient Re-evaluated:Patient Re-evaluated prior to inductionOxygen Delivery Method: Circle system utilized Preoxygenation: Pre-oxygenation with 100% oxygen Intubation Type: IV induction Ventilation: Mask ventilation without difficulty LMA: LMA inserted LMA Size: 5.0 Number of attempts: 1 Placement Confirmation: positive ETCO2 and breath sounds checked- equal and bilateral Tube secured with: Tape Dental Injury: Teeth and Oropharynx as per pre-operative assessment

## 2016-11-20 ENCOUNTER — Encounter (HOSPITAL_COMMUNITY): Payer: Self-pay | Admitting: Plastic Surgery

## 2016-11-20 LAB — HEMOGLOBIN A1C
Hgb A1c MFr Bld: 6.6 % — ABNORMAL HIGH (ref 4.8–5.6)
Mean Plasma Glucose: 143 mg/dL

## 2016-11-20 NOTE — Anesthesia Postprocedure Evaluation (Addendum)
Anesthesia Post Note  Patient: Andrew [name]  Procedure(s) Performed: Procedure(s) (LRB): APPLICATION OF A-CELL OF EXTREMITY (Right) APPLICATION OF WOUND VAC (Right) IRRIGATION AND DEBRIDEMENT WOUND (Right)  Patient location during evaluation: PACU Anesthesia Type: General Level of consciousness: awake and alert Pain management: pain level controlled Vital Signs Assessment: post-procedure vital signs reviewed and stable Respiratory status: spontaneous breathing, nonlabored ventilation, respiratory function stable and patient connected to nasal cannula oxygen Cardiovascular status: blood pressure returned to baseline and stable Postop Assessment: no signs of nausea or vomiting Anesthetic complications: no       Last Vitals:  Vitals:   11/19/16 1045 11/19/16 1100  BP: (!) 146/64 (!) 152/69  Pulse: 64 69  Resp: 15 14  Temp:      Last Pain:  Vitals:   11/19/16 0922  TempSrc:   PainSc: 0-No pain                 Effie Berkshire

## 2016-11-23 LAB — ACID FAST CULTURE WITH REFLEXED SENSITIVITIES: ACID FAST CULTURE - AFSCU3: NEGATIVE

## 2017-01-05 NOTE — Progress Notes (Signed)
Chart reviewed by Dr Aris Lot and felt patient would be better served being done at [place].

## 2017-01-08 ENCOUNTER — Ambulatory Visit: Payer: Self-pay | Admitting: Plastic Surgery

## 2017-01-08 DIAGNOSIS — S41101A Unspecified open wound of right upper arm, initial encounter: Secondary | ICD-10-CM

## 2017-01-12 ENCOUNTER — Encounter (HOSPITAL_BASED_OUTPATIENT_CLINIC_OR_DEPARTMENT_OTHER): Payer: Self-pay | Admitting: *Deleted

## 2017-01-12 NOTE — Progress Notes (Signed)
Andrew [name] denies chest pain or shortness of breath. Andrew [name] daughter fills medication box and patient takes it.  Patient allows neighbor, Andrew [name] to check CBG and administer Toujeo.  CBG was > 400 his morning.  CBG is only checked 1 time a day, I asked Andrew [name] to check it again today. I asked Andrew [name] to call pre- op in am if CBG if greater than 400, and ask for instruction on how was Tojeuo to give, patient should take 1/2 of schedule dose if CBG is > 70.

## 2017-01-13 ENCOUNTER — Encounter (HOSPITAL_COMMUNITY)
Admission: RE | Disposition: A | Discharge status: Home / Self Care | Payer: Self-pay | Source: Ambulatory Visit | Attending: Plastic Surgery

## 2017-01-13 ENCOUNTER — Encounter (HOSPITAL_COMMUNITY): Payer: Self-pay

## 2017-01-13 ENCOUNTER — Ambulatory Visit (HOSPITAL_BASED_OUTPATIENT_CLINIC_OR_DEPARTMENT_OTHER)
Admission: RE | Admit: 2017-01-13 | Discharge: 2017-01-13 | Disposition: A | Discharge status: Home / Self Care | Payer: Medicare Other | Source: Ambulatory Visit | Attending: Plastic Surgery | Admitting: Plastic Surgery

## 2017-01-13 ENCOUNTER — Ambulatory Visit (HOSPITAL_COMMUNITY): Payer: Medicare Other | Admitting: Certified Registered Nurse Anesthetist

## 2017-01-13 DIAGNOSIS — M7021 Olecranon bursitis, right elbow: Secondary | ICD-10-CM | POA: Insufficient documentation

## 2017-01-13 DIAGNOSIS — H5461 Unqualified visual loss, right eye, normal vision left eye: Secondary | ICD-10-CM | POA: Insufficient documentation

## 2017-01-13 DIAGNOSIS — E1140 Type 2 diabetes mellitus with diabetic neuropathy, unspecified: Secondary | ICD-10-CM | POA: Diagnosis not present

## 2017-01-13 DIAGNOSIS — L03113 Cellulitis of right upper limb: Secondary | ICD-10-CM | POA: Insufficient documentation

## 2017-01-13 DIAGNOSIS — Z7982 Long term (current) use of aspirin: Secondary | ICD-10-CM | POA: Insufficient documentation

## 2017-01-13 DIAGNOSIS — E785 Hyperlipidemia, unspecified: Secondary | ICD-10-CM | POA: Diagnosis not present

## 2017-01-13 DIAGNOSIS — X58XXXD Exposure to other specified factors, subsequent encounter: Secondary | ICD-10-CM | POA: Insufficient documentation

## 2017-01-13 DIAGNOSIS — S51001D Unspecified open wound of right elbow, subsequent encounter: Secondary | ICD-10-CM | POA: Insufficient documentation

## 2017-01-13 DIAGNOSIS — Z794 Long term (current) use of insulin: Secondary | ICD-10-CM | POA: Insufficient documentation

## 2017-01-13 DIAGNOSIS — Z79899 Other long term (current) drug therapy: Secondary | ICD-10-CM | POA: Insufficient documentation

## 2017-01-13 DIAGNOSIS — J42 Unspecified chronic bronchitis: Secondary | ICD-10-CM | POA: Diagnosis present

## 2017-01-13 DIAGNOSIS — I110 Hypertensive heart disease with heart failure: Secondary | ICD-10-CM | POA: Insufficient documentation

## 2017-01-13 DIAGNOSIS — Z833 Family history of diabetes mellitus: Secondary | ICD-10-CM | POA: Insufficient documentation

## 2017-01-13 DIAGNOSIS — I509 Heart failure, unspecified: Secondary | ICD-10-CM | POA: Insufficient documentation

## 2017-01-13 DIAGNOSIS — I739 Peripheral vascular disease, unspecified: Secondary | ICD-10-CM | POA: Diagnosis not present

## 2017-01-13 DIAGNOSIS — S41101A Unspecified open wound of right upper arm, initial encounter: Secondary | ICD-10-CM

## 2017-01-13 DIAGNOSIS — I714 Abdominal aortic aneurysm, without rupture: Secondary | ICD-10-CM | POA: Insufficient documentation

## 2017-01-13 DIAGNOSIS — F17210 Nicotine dependence, cigarettes, uncomplicated: Secondary | ICD-10-CM | POA: Diagnosis not present

## 2017-01-13 DIAGNOSIS — Z880 Allergy status to penicillin: Secondary | ICD-10-CM | POA: Diagnosis not present

## 2017-01-13 HISTORY — PX: GRAFT APPLICATION: SHX6696

## 2017-01-13 HISTORY — PX: SKIN SPLIT GRAFT: SHX444

## 2017-01-13 HISTORY — PX: APPLICATION OF A-CELL OF EXTREMITY: SHX6303

## 2017-01-13 LAB — GLUCOSE, CAPILLARY
GLUCOSE-CAPILLARY: 193 mg/dL — AB (ref 65–99)
Glucose-Capillary: 189 mg/dL — ABNORMAL HIGH (ref 65–99)

## 2017-01-13 LAB — BASIC METABOLIC PANEL
Anion gap: 12 (ref 5–15)
BUN: 24 mg/dL — AB (ref 6–20)
CHLORIDE: 95 mmol/L — AB (ref 101–111)
CO2: 25 mmol/L (ref 22–32)
Calcium: 9.4 mg/dL (ref 8.9–10.3)
Creatinine, Ser: 0.76 mg/dL (ref 0.61–1.24)
GFR calc Af Amer: >60 mL/min (ref >60)
GFR calc non Af Amer: >60 mL/min (ref >60)
GLUCOSE: 193 mg/dL — AB (ref 65–99)
POTASSIUM: 5.3 mmol/L — AB (ref 3.5–5.1)
Sodium: 132 mmol/L — ABNORMAL LOW (ref 135–145)

## 2017-01-13 LAB — CBC
HEMATOCRIT: 39.3 % (ref 39.0–52.0)
Hemoglobin: 12.8 g/dL — ABNORMAL LOW (ref 13.0–17.0)
MCH: 26.8 pg (ref 26.0–34.0)
MCHC: 32.6 g/dL (ref 30.0–36.0)
MCV: 82.4 fL (ref 78.0–100.0)
Platelets: 390 10*3/uL (ref 150–400)
RBC: 4.77 MIL/uL (ref 4.22–5.81)
RDW: 14.4 % (ref 11.5–15.5)
WBC: 11.3 10*3/uL — ABNORMAL HIGH (ref 4.0–10.5)

## 2017-01-13 LAB — SURGICAL PCR SCREEN
MRSA, PCR: NEGATIVE
STAPHYLOCOCCUS AUREUS: NEGATIVE

## 2017-01-13 SURGERY — GRAFT APPLICATION
Anesthesia: General | Site: Leg Upper | Laterality: Right

## 2017-01-13 MED ORDER — EPINEPHRINE PF 1 MG/ML IJ SOLN
INTRAMUSCULAR | Status: DC | PRN
  Administered 2017-01-13: .15 mg via SUBCUTANEOUS

## 2017-01-13 MED ORDER — FENTANYL CITRATE (PF) 250 MCG/5ML IJ SOLN
INTRAMUSCULAR | Status: DC | PRN
  Administered 2017-01-13: 25 ug via INTRAVENOUS
  Administered 2017-01-13: 50 ug via INTRAVENOUS

## 2017-01-13 MED ORDER — EPINEPHRINE PF 1 MG/ML IJ SOLN
INTRAMUSCULAR | Status: AC
  Filled 2017-01-13: qty 1

## 2017-01-13 MED ORDER — PROMETHAZINE HCL 25 MG/ML IJ SOLN
6.2500 mg | INTRAMUSCULAR | Status: DC | PRN
Start: 2017-01-13 — End: 2017-01-13

## 2017-01-13 MED ORDER — BUPIVACAINE-EPINEPHRINE 0.25% -1:200000 IJ SOLN
INTRAMUSCULAR | Status: DC | PRN
  Administered 2017-01-13: 20 mL

## 2017-01-13 MED ORDER — HYDROCODONE-ACETAMINOPHEN 5-325 MG PO TABS: 1.0000 | ORAL_TABLET | Freq: Four times a day (QID) | ORAL | 0 refills | Status: DC | PRN

## 2017-01-13 MED ORDER — ONDANSETRON HCL 4 MG/2ML IJ SOLN
INTRAMUSCULAR | Status: DC | PRN
  Administered 2017-01-13: 4 mg via INTRAVENOUS

## 2017-01-13 MED ORDER — MUPIROCIN 2 % EX OINT
TOPICAL_OINTMENT | CUTANEOUS | Status: AC
  Filled 2017-01-13: qty 22

## 2017-01-13 MED ORDER — MIDAZOLAM HCL 2 MG/2ML IJ SOLN
INTRAMUSCULAR | Status: AC
  Filled 2017-01-13: qty 2

## 2017-01-13 MED ORDER — CIPROFLOXACIN IN D5W 400 MG/200ML IV SOLN
400.0000 mg | INTRAVENOUS | Status: AC
  Administered 2017-01-13: 400 mg via INTRAVENOUS

## 2017-01-13 MED ORDER — MUPIROCIN 2 % EX OINT
TOPICAL_OINTMENT | Freq: Two times a day (BID) | CUTANEOUS | Status: DC
  Administered 2017-01-13: 07:00:00 via NASAL
  Filled 2017-01-13: qty 22

## 2017-01-13 MED ORDER — LIDOCAINE HCL (CARDIAC) 20 MG/ML IV SOLN
INTRAVENOUS | Status: DC | PRN
  Administered 2017-01-13: 60 mg via INTRATRACHEAL

## 2017-01-13 MED ORDER — FENTANYL CITRATE (PF) 250 MCG/5ML IJ SOLN
INTRAMUSCULAR | Status: AC
  Filled 2017-01-13: qty 5

## 2017-01-13 MED ORDER — PROPOFOL 10 MG/ML IV BOLUS
INTRAVENOUS | Status: AC
  Filled 2017-01-13: qty 20

## 2017-01-13 MED ORDER — EVICEL 5 ML EX KIT
PACK | CUTANEOUS | Status: DC | PRN
  Administered 2017-01-13: 1

## 2017-01-13 MED ORDER — BUPIVACAINE HCL (PF) 0.25 % IJ SOLN
INTRAMUSCULAR | Status: AC
  Filled 2017-01-13: qty 30

## 2017-01-13 MED ORDER — EPHEDRINE SULFATE-NACL 50-0.9 MG/10ML-% IV SOSY
PREFILLED_SYRINGE | INTRAVENOUS | Status: DC | PRN
  Administered 2017-01-13 (×2): 10 mg via INTRAVENOUS

## 2017-01-13 MED ORDER — EVICEL 5 ML EX KIT
PACK | CUTANEOUS | Status: AC
  Filled 2017-01-13: qty 1

## 2017-01-13 MED ORDER — 0.9 % SODIUM CHLORIDE (POUR BTL) OPTIME
TOPICAL | Status: DC | PRN
  Administered 2017-01-13: 1000 mL

## 2017-01-13 MED ORDER — CIPROFLOXACIN IN D5W 400 MG/200ML IV SOLN
INTRAVENOUS | Status: AC
  Filled 2017-01-13: qty 200

## 2017-01-13 MED ORDER — PROPOFOL 10 MG/ML IV BOLUS
INTRAVENOUS | Status: DC | PRN
  Administered 2017-01-13: 150 mg via INTRAVENOUS

## 2017-01-13 MED ORDER — FENTANYL CITRATE (PF) 100 MCG/2ML IJ SOLN: 25.0000 ug | INTRAMUSCULAR | Status: DC | PRN

## 2017-01-13 MED ORDER — LACTATED RINGERS IV SOLN
INTRAVENOUS | Status: DC
  Administered 2017-01-13: 07:00:00 via INTRAVENOUS

## 2017-01-13 SURGICAL SUPPLY — 79 items
APL SKNCLS STERI-STRIP NONHPOA (GAUZE/BANDAGES/DRESSINGS) ×8
BANDAGE ACE 4X5 VEL STRL LF (GAUZE/BANDAGES/DRESSINGS) ×6 IMPLANT
BANDAGE ACE 6X5 VEL STRL LF (GAUZE/BANDAGES/DRESSINGS) IMPLANT
BENZOIN TINCTURE PRP APPL 2/3 (GAUZE/BANDAGES/DRESSINGS) ×12 IMPLANT
BLADE 10 SAFETY STRL DISP (BLADE) ×6 IMPLANT
BLADE CLIPPER SURG (BLADE) IMPLANT
BLADE DERMATOME II (BLADE) ×6 IMPLANT
BNDG COHESIVE 6X5 TAN STRL LF (GAUZE/BANDAGES/DRESSINGS) ×3 IMPLANT
BNDG GAUZE ELAST 4 BULKY (GAUZE/BANDAGES/DRESSINGS) ×3 IMPLANT
CANISTER SUCT 3000ML PPV (MISCELLANEOUS) ×3 IMPLANT
CANISTER WOUND CARE 500ML ATS (WOUND CARE) ×6 IMPLANT
COVER MAYO STAND STRL (DRAPES) ×3 IMPLANT
COVER SURGICAL LIGHT HANDLE (MISCELLANEOUS) ×6 IMPLANT
DERMACARRIERS GRAFT 1 TO 1.5 (DISPOSABLE) ×6
DRAPE HALF SHEET 40X57 (DRAPES) ×6 IMPLANT
DRAPE INCISE IOBAN 66X45 STRL (DRAPES) ×6 IMPLANT
DRAPE ORTHO SPLIT 77X108 STRL (DRAPES) ×12
DRAPE SURG ORHT 6 SPLT 77X108 (DRAPES) ×8 IMPLANT
DRESSING HYDROCOLLOID 4X4 XTH (GAUZE/BANDAGES/DRESSINGS) ×3 IMPLANT
DRSG ADAPTIC 3X8 NADH LF (GAUZE/BANDAGES/DRESSINGS) IMPLANT
DRSG CUTIMED SORBACT 7X9 (GAUZE/BANDAGES/DRESSINGS) ×6 IMPLANT
DRSG OPSITE 6X11 MED (GAUZE/BANDAGES/DRESSINGS) IMPLANT
DRSG PAD ABDOMINAL 8X10 ST (GAUZE/BANDAGES/DRESSINGS) ×6 IMPLANT
DRSG TELFA 3X8 NADH (GAUZE/BANDAGES/DRESSINGS) IMPLANT
DRSG VAC ATS LRG SENSATRAC (GAUZE/BANDAGES/DRESSINGS) IMPLANT
DRSG VAC ATS MED SENSATRAC (GAUZE/BANDAGES/DRESSINGS) IMPLANT
DRSG VAC ATS SM SENSATRAC (GAUZE/BANDAGES/DRESSINGS) ×3 IMPLANT
ELECT CAUTERY BLADE 6.4 (BLADE) ×6 IMPLANT
ELECT REM PT RETURN 9FT ADLT (ELECTROSURGICAL) ×6
ELECTRODE REM PT RTRN 9FT ADLT (ELECTROSURGICAL) ×1 IMPLANT
FILTER STRAW FLUID ASPIR (MISCELLANEOUS) ×6 IMPLANT
GAUZE SPONGE 4X4 12PLY STRL (GAUZE/BANDAGES/DRESSINGS) ×6 IMPLANT
GAUZE SPONGE 4X4 12PLY STRL LF (GAUZE/BANDAGES/DRESSINGS) ×3 IMPLANT
GAUZE XEROFORM 5X9 LF (GAUZE/BANDAGES/DRESSINGS) ×6 IMPLANT
GEL ULTRASOUND 20GR AQUASONIC (MISCELLANEOUS) ×6 IMPLANT
GLOVE BIO SURGEON STRL SZ 6.5 (GLOVE) ×12 IMPLANT
GLOVE BIO SURGEONS STRL SZ 6.5 (GLOVE) ×3
GLOVE ECLIPSE 6.0 STRL STRAW (GLOVE) ×3 IMPLANT
GOWN STRL REUS W/ TWL LRG LVL3 (GOWN DISPOSABLE) ×13 IMPLANT
GOWN STRL REUS W/TWL LRG LVL3 (GOWN DISPOSABLE) ×24
GRAFT DERMACARRIERS 1 TO 1.5 (DISPOSABLE) ×4 IMPLANT
HANDPIECE INTERPULSE COAX TIP (DISPOSABLE)
KIT BASIN OR (CUSTOM PROCEDURE TRAY) ×6 IMPLANT
KIT ROOM TURNOVER OR (KITS) ×6 IMPLANT
MATRIX WOUND 3-LAYER 7X10 (Tissue) ×2 IMPLANT
NDL HYPO 25GX1X1/2 BEV (NEEDLE) IMPLANT
NDL SPNL 18GX3.5 QUINCKE PK (NEEDLE) ×3 IMPLANT
NEEDLE HYPO 25GX1X1/2 BEV (NEEDLE) ×6 IMPLANT
NEEDLE SPNL 18GX3.5 QUINCKE PK (NEEDLE) ×6 IMPLANT
NS IRRIG 1000ML POUR BTL (IV SOLUTION) ×6 IMPLANT
PACK GENERAL/GYN (CUSTOM PROCEDURE TRAY) ×6 IMPLANT
PACK ORTHO EXTREMITY (CUSTOM PROCEDURE TRAY) ×6 IMPLANT
PAD ABD 8X10 STRL (GAUZE/BANDAGES/DRESSINGS) ×6 IMPLANT
PAD ARMBOARD 7.5X6 YLW CONV (MISCELLANEOUS) ×12 IMPLANT
PAD DRESSING TELFA 3X8 NADH (GAUZE/BANDAGES/DRESSINGS) ×6 IMPLANT
SET HNDPC FAN SPRY TIP SCT (DISPOSABLE) IMPLANT
SOL PREP POV-IOD 4OZ 10% (MISCELLANEOUS) ×3 IMPLANT
SPLINT FIBERGLASS 3X35 (CAST SUPPLIES) ×3 IMPLANT
STAPLER VISISTAT 35W (STAPLE) ×6 IMPLANT
STOCKINETTE IMPERVIOUS 9X36 MD (GAUZE/BANDAGES/DRESSINGS) IMPLANT
STOCKINETTE IMPERVIOUS LG (DRAPES) ×3 IMPLANT
SUT CHROMIC 4 0 PS 2 18 (SUTURE) ×3 IMPLANT
SUT SILK 4 0 P 3 (SUTURE) ×6 IMPLANT
SUT SILK 4 0 PS 2 (SUTURE) IMPLANT
SUT VIC AB 5-0 P-3 18XBRD (SUTURE) ×1 IMPLANT
SUT VIC AB 5-0 P3 18 (SUTURE) ×6
SUT VIC AB 5-0 PS2 18 (SUTURE) ×6 IMPLANT
SYR 3ML 25GX5/8 SAFETY (SYRINGE) ×6 IMPLANT
SYR CONTROL 10ML LL (SYRINGE) ×6 IMPLANT
SYR TB 1ML LUER SLIP (SYRINGE) ×3 IMPLANT
TOWEL OR 17X24 6PK STRL BLUE (TOWEL DISPOSABLE) ×3 IMPLANT
TOWEL OR 17X26 10 PK STRL BLUE (TOWEL DISPOSABLE) ×6 IMPLANT
TUBE CONNECTING 12'X1/4 (SUCTIONS) ×1
TUBE CONNECTING 12X1/4 (SUCTIONS) ×5 IMPLANT
TUBE CONNECTING 20'X1/4 (TUBING) ×1
TUBE CONNECTING 20X1/4 (TUBING) ×2 IMPLANT
UNDERPAD 30X30 (UNDERPADS AND DIAPERS) ×6 IMPLANT
WOUND MATRIX 3-LAYER 7X10 (Tissue) ×1 IMPLANT
YANKAUER SUCT BULB TIP NO VENT (SUCTIONS) ×6 IMPLANT

## 2017-01-13 NOTE — Progress Notes (Signed)
Orthopedic Tech Progress Note Patient Details:  Andrew [name] 1946-06-17 [id number]  Ortho Devices Type of Ortho Device: Arm sling Ortho Device/Splint Location: rue Ortho Device/Splint Interventions: Application   [name] 01/13/2017, 12:21 PM

## 2017-01-13 NOTE — Anesthesia Procedure Notes (Signed)
Procedure Name: LMA Insertion Date/Time: 01/13/2017 8:50 AM Performed by: Julieta Bellini Pre-anesthesia Checklist: Patient identified, Emergency Drugs available, Suction available and Patient being monitored Patient Re-evaluated:Patient Re-evaluated prior to inductionOxygen Delivery Method: Circle system utilized Preoxygenation: Pre-oxygenation with 100% oxygen Intubation Type: IV induction Ventilation: Mask ventilation without difficulty LMA: LMA inserted LMA Size: 5.0 Number of attempts: 1 Placement Confirmation: positive ETCO2 and breath sounds checked- equal and bilateral Tube secured with: Tape Dental Injury: Teeth and Oropharynx as per pre-operative assessment

## 2017-01-13 NOTE — Transfer of Care (Signed)
Immediate Anesthesia Transfer of Care Note  Patient: Andrew [name]  Procedure(s) Performed: Procedure(s): GRAFT APPLICATION  RIGHT UPPER ARM FROM DONOR SITE (Right) APPLICATION OF A-CELL OF EXTREMITY (Right) SKIN GRAFT SPLIT THICKNESS (Right)  Patient Location: PACU  Anesthesia Type:General  Level of Consciousness: drowsy and patient cooperative  Airway & Oxygen Therapy: Patient Spontanous Breathing and Patient connected to face mask oxygen  Post-op Assessment: Report given to RN, Post -op Vital signs reviewed and stable and Patient moving all extremities X 4  Post vital signs: Reviewed and stable  Last Vitals:  Vitals:   01/13/17 0715  BP: 132/62  Pulse: 78  Resp: 16  Temp: 36.8 C    Last Pain:  Vitals:   01/13/17 0715  TempSrc: Oral         Complications: No apparent anesthesia complications

## 2017-01-13 NOTE — Op Note (Signed)
DATE OF OPERATION: 01/13/2017  LOCATION: [facility]  PREOPERATIVE DIAGNOSIS: right arm wound  POSTOPERATIVE DIAGNOSIS: Same  PROCEDURE: Split thickness skin graft to right arm wound 150 cm2 with VAC placement and Acell to donor site (7 x 10 cm)  SURGEON: Keisuke Hollabaugh Sanger Jaasia Viglione, DO  ASSISTANT: Shawn Rayburn, PA  EBL: 10 cc  CONDITION: Stable  COMPLICATIONS: None  INDICATION: The patient, Andrew [name], is a 71 y.o. male born on 1946-06-28, is here for treatment of a right arm chronic wound after infection from bursitis.   PROCEDURE DETAILS:  The patient was seen prior to surgery and marked.  The IV antibiotics were given. The patient was taken to the operating room and given a general anesthetic. A standard time out was performed and all information was confirmed by those in the room. SCDs were placed.   The right arm and left thigh were prepped and draped in the usual sterile fashion.  The right arm was irrigated with saline and mild debridement done at the edges of the wound.  The 150 cm2 area was prepared.  The local was injected in the left thigh for the donor site.  The dermatome was set at 14/1000 inch and the split thickness skin graft obtained.  The Acell 7 x 10 cm sheet was applied to the donor site and secured to the skin with the 5-0 Vicryl.  The area was then covered with the sorbact and KY gel applied.  The leg was wrapped with ABD and tape.  Attention was then turned to the right arm.  Slits were placed in the graft to prevent fluid accumulation under the graft.  The Evicel was placed on the wound bed and then the graft placed on top.  The graft was secured with the 5-0 Vicryl.  The sorbact was applied over the graft and then the VAC.  There was an excellent seal.  The arm was wrapped with the kerlex and placed in a splint and ace wrap for protection.  The patient was allowed to wake up and taken to recovery room in stable condition at the end of the case.  The family was notified at the end of the case.

## 2017-01-13 NOTE — Anesthesia Preprocedure Evaluation (Signed)
Anesthesia Evaluation  Patient identified by MRN, date of birth, ID band Patient awake    Reviewed: Allergy & Precautions, NPO status , Patient's Chart, lab work & pertinent test results, reviewed documented beta blocker date and time   Airway Mallampati: II       Dental  (+) Poor Dentition, Dental Advisory Given   Pulmonary neg pulmonary ROS, Current Smoker,    breath sounds clear to auscultation       Cardiovascular hypertension, Pt. on medications and Pt. on home beta blockers + Peripheral Vascular Disease and +CHF  negative cardio ROS   Rhythm:Regular Rate:Normal     Neuro/Psych PSYCHIATRIC DISORDERS negative neurological ROS     GI/Hepatic negative GI ROS, Neg liver ROS,   Endo/Other  negative endocrine ROSdiabetes, Type 2, Insulin Dependent, Oral Hypoglycemic Agents  Renal/GU Renal disease  negative genitourinary   Musculoskeletal negative musculoskeletal ROS (+)   Abdominal   Peds negative pediatric ROS (+)  Hematology negative hematology ROS (+)   Anesthesia Other Findings - HLD - AAA  Day of surgery medications reviewed with the patient.  Reproductive/Obstetrics                             EKG: Sinus Tachycardia  Echo: - Left ventricle: The cavity size was normal. Wall thickness was increased in a pattern of moderate to severe LVH. Systolic function was normal. The estimated ejection fraction was in the range of 60% to 65%. Wall motion was normal; there were no regional wall motion abnormalities. Doppler parameters are consistent with abnormal left ventricular relaxation (grade 1 diastolic dysfunction). - Aortic valve: There was mild stenosis. Valve area (VTI): 1.84   cm^2. Valve area (Vmax): 1.63 cm^2. Valve area (Vmean): 1.77   cm^2. - Left atrium: The atrium was moderately dilated. - Right atrium: The atrium was moderately dilated. - Atrial septum: There was an atrial  septal aneurysm. - Line: A venous catheter was visualized in the superior vena cava, with its tip in the right atrium. No abnormal features noted.   Anesthesia Physical  Anesthesia Plan  ASA: III  Anesthesia Plan: General   Post-op Pain Management:    Induction: Intravenous  Airway Management Planned: LMA  Additional Equipment:   Intra-op Plan:   Post-operative Plan: Extubation in OR  Informed Consent: I have reviewed the patients History and Physical, chart, labs and discussed the procedure including the risks, benefits and alternatives for the proposed anesthesia with the patient or authorized representative who has indicated his/her understanding and acceptance.   Dental advisory given  Plan Discussed with: CRNA  Anesthesia Plan Comments:         Anesthesia Quick Evaluation

## 2017-01-13 NOTE — H&P (Signed)
Andrew [name] is an 71 y.o. male.   Chief Complaint: right arm wound HPI: The patient is a 71 yrs old wm here for treatment of his right arm wound.  He has undergone debridement in the past from a right olecranon bursitis with infection.  He is doing better but has minimal help at home. The area involves the posterior aspect of the lower upper arm and proximal forearm.  Past Medical History:  Diagnosis Date  . Blind right eye    S/P shotgun  . Bursitis    right Olecranon  . Chronic bronchitis ([place])   . Diabetic neuropathy ([place])   . Hyperlipemia   . Hypertension   . 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 Type II diabetes mellitus ([place])   . Wound cellulitis    right elbow open    Past Surgical History:  Procedure Laterality Date  . APPLICATION OF A-CELL OF EXTREMITY Right 10/15/2016   Procedure: APPLICATION OF A-CELL OF EXTREMITY;  Surgeon: Loel Lofty Dillingham, DO;  Location: [place];  Service: Plastics;  Laterality: Right;  . APPLICATION OF A-CELL OF EXTREMITY Right 11/19/2016   Procedure: APPLICATION OF A-CELL OF EXTREMITY;  Surgeon: Loel Lofty Dillingham, DO;  Location: [place];  Service: Plastics;  Laterality: Right;  . APPLICATION OF WOUND VAC Right 10/09/2016   Procedure: APPLICATION OF WOUND VAC;  Surgeon: Leandrew Koyanagi, MD;  Location: [place];  Service: Orthopedics;  Laterality: Right;  . APPLICATION OF WOUND VAC Right 10/15/2016   Procedure: WOUND VAC CHANGE;  Surgeon: Loel Lofty Dillingham, DO;  Location: [place];  Service: Plastics;  Laterality: Right;  . APPLICATION OF WOUND VAC Right 11/19/2016   Procedure: APPLICATION OF WOUND VAC;  Surgeon: Loel Lofty Dillingham, DO;  Location: [place];  Service: Plastics;  Laterality: Right;  . I&D EXTREMITY Right 10/09/2016   Procedure: IRRIGATION AND DEBRIDEMENT EXTREMITY RIGHT ARM WOUND;  Surgeon: Leandrew Koyanagi, MD;  Location: [place];  Service: Orthopedics;  Laterality: Right;  . I&D EXTREMITY N/A 10/11/2016   Procedure:  IRRIGATION AND DEBRIDEMENT EXTREMITY RIGHT ARM WOUND;  Surgeon: Leandrew Koyanagi, MD;  Location: [place];  Service: Orthopedics;  Laterality: N/A;  . I&D EXTREMITY Right 10/15/2016   Procedure: IRRIGATION AND DEBRIDEMENT EXTREMITY/RIGHT ELBOW;  Surgeon: Loel Lofty Dillingham, DO;  Location: [place];  Service: Plastics;  Laterality: Right;  . INCISION AND DRAINAGE OF WOUND Right 10/09/2016   arm  . INCISION AND DRAINAGE OF WOUND Right 11/19/2016   Procedure: IRRIGATION AND DEBRIDEMENT WOUND;  Surgeon: Loel Lofty Dillingham, DO;  Location: [place];  Service: Plastics;  Laterality: Right;    Family History  Problem Relation Age of Onset  . Diabetes Mellitus II Mother   . Diabetes Mellitus II Father   . Diabetes Mellitus II Sister   . Diabetes Mellitus II Sister    Social History:  reports that he has been smoking Cigarettes.  He has a 94.50 pack-year smoking history. He has quit using smokeless tobacco. His smokeless tobacco use included Chew. He reports that he drinks alcohol. He reports that he does not use drugs.  Allergies:  Allergies  Allergen Reactions  . Penicillins Rash and Other (See Comments)    UNSPECIFIED REACTION DESCRIPTION Tolerated Zosyn Jan 2018  Possibly rash? Has patient had a PCN reaction causing immediate rash, facial/tongue/throat swelling, SOB or lightheadedness with hypotension: YES Has patient had a PCN reaction causing severe rash involving mucus membranes or skin necrosis:NO Has patient  had a PCN reaction that required hospitalization NO Has patient had a PCN reaction occurring within the last 10 years:NO If all of the above answers are "NO", then may proceed with Cephal    Medications Prior to Admission  Medication Sig Dispense Refill  . amLODipine (NORVASC) 10 MG tablet Take 1 tablet (10 mg total) by mouth daily. 30 tablet 0  . aspirin 325 MG tablet Take 325 mg by mouth daily.    . ciprofloxacin (CIPRO) 750 MG tablet Take 1 tablet (750 mg total) by mouth 2 (two) times  daily. 72 tablet 0  . furosemide (LASIX) 20 MG tablet Take 20 mg by mouth daily.    Marland Kitchen gabapentin (NEURONTIN) 100 MG capsule Take 100 mg by mouth 3 (three) times daily.    Marland Kitchen HYDROcodone-acetaminophen (NORCO) 5-325 MG tablet Take 1 tablet by mouth every 12 (twelve) hours as needed for moderate pain. 20 tablet 0  . Insulin Glargine (TOUJEO SOLOSTAR) 300 UNIT/ML SOPN Inject 60 Units into the skin daily.    . metFORMIN (GLUCOPHAGE-XR) 500 MG 24 hr tablet Take 1,000 mg by mouth 2 (two) times daily.    . metoprolol succinate (TOPROL-XL) 100 MG 24 hr tablet Take 1 tablet (100 mg total) by mouth daily. Take with or immediately following a meal. (Patient taking differently: Take 50 mg by mouth daily. Take with or immediately following a meal.) 30 tablet 2  . oxyCODONE-acetaminophen (PERCOCET/ROXICET) 5-325 MG tablet Take 1-2 tablets by mouth every 6 (six) hours as needed for moderate pain or severe pain. 20 tablet 0  . simvastatin (ZOCOR) 40 MG tablet Take 40 mg by mouth daily.    . sitaGLIPtin (JANUVIA) 100 MG tablet Take 100 mg by mouth daily.    Marland Kitchen thiamine 100 MG tablet Take 1 tablet (100 mg total) by mouth daily. 30 tablet 2  . feeding supplement, ENSURE ENLIVE, (ENSURE ENLIVE) LIQD Take 237 mLs by mouth daily. (Patient taking differently: Take 237 mLs by mouth 3 (three) times daily between meals. ) 237 mL 12  . insulin aspart (NOVOLOG) 100 UNIT/ML injection Sliding scale insulin Less than 70 initiate hypoglycemia protocol 70-120  0 units 120-150 1 unit 151-200 2 units 201-250 3 units 251-300 5 units 301-350 7 units 351-400 9 units  Greater than 400 call MD (Patient not taking: Reported on 11/19/2016) 10 mL 11  . insulin glargine (LANTUS) 100 UNIT/ML injection Inject 0.16 mLs (16 Units total) into the skin at bedtime. (Patient not taking: Reported on 11/19/2016) 10 mL 0    Results for orders placed or performed during the hospital encounter of 01/13/17 (from the past 48 hour(s))  Glucose, capillary      Status: Abnormal   Collection Time: 01/13/17  6:48 AM  Result Value Ref Range   Glucose-Capillary 193 (H) 65 - 99 mg/dL   Comment 1 Notify RN    Comment 2 Document in Chart   CBC     Status: Abnormal   Collection Time: 01/13/17  7:01 AM  Result Value Ref Range   WBC 11.3 (H) 4.0 - 10.5 K/uL   RBC 4.77 4.22 - 5.81 MIL/uL   Hemoglobin 12.8 (L) 13.0 - 17.0 g/dL   HCT 39.3 39.0 - 52.0 %   MCV 82.4 78.0 - 100.0 fL   MCH 26.8 26.0 - 34.0 pg   MCHC 32.6 30.0 - 36.0 g/dL   RDW 14.4 11.5 - 15.5 %   Platelets 390 150 - 400 K/uL  Basic metabolic panel  Status: Abnormal   Collection Time: 01/13/17  7:01 AM  Result Value Ref Range   Sodium 132 (L) 135 - 145 mmol/L   Potassium 5.3 (H) 3.5 - 5.1 mmol/L   Chloride 95 (L) 101 - 111 mmol/L   CO2 25 22 - 32 mmol/L   Glucose, Bld 193 (H) 65 - 99 mg/dL   BUN 24 (H) 6 - 20 mg/dL   Creatinine, Ser 0.76 0.61 - 1.24 mg/dL   Calcium 9.4 8.9 - 10.3 mg/dL   GFR calc non Af Amer >60 >60 mL/min   GFR calc Af Amer >60 >60 mL/min    Comment: (NOTE) The eGFR has been calculated using the CKD EPI equation. This calculation has not been validated in all clinical situations. eGFR's persistently <60 mL/min signify possible Chronic Kidney Disease.    Anion gap 12 5 - 15   No results found.  Review of Systems  Constitutional: Negative.   HENT: Negative.   Eyes: Negative.   Respiratory: Negative.   Cardiovascular: Negative.   Gastrointestinal: Negative.   Genitourinary: Negative.   Musculoskeletal: Negative.   Skin: Negative.   Neurological: Negative.   Psychiatric/Behavioral: Negative.     Blood pressure 132/62, pulse 78, temperature 98.3 F (36.8 C), temperature source Oral, resp. rate 16, height 5' 8"  (1.727 m), weight 70.8 kg (156 lb), SpO2 100 %. Physical Exam  Constitutional: He is oriented to person, place, and time. He appears well-developed and well-nourished.  HENT:  Head: Normocephalic and atraumatic.  Eyes: EOM are normal.  Pupils are equal, round, and reactive to light.  Respiratory: Effort normal. No respiratory distress.  GI: Soft. He exhibits no distension. There is no tenderness.  Musculoskeletal: He exhibits edema and tenderness.  Neurological: He is alert and oriented to person, place, and time.  Skin: Skin is warm. No rash noted. No erythema. No pallor.  Psychiatric: He has a normal mood and affect. His behavior is normal. Judgment and thought content normal.     Assessment/Plan Plan for debridement of right arm with placement of skin graft and acell on the donor site if able.  Otherwise we will place Acell.  The patient has agreed to the [facility].  Wallace Going, DO 01/13/2017, 8:20 AM

## 2017-01-13 NOTE — Progress Notes (Signed)
Orthopedic Tech Progress Note Patient Details:  Andrew [name] 1946-03-30 [id number]  Patient ID: [name], male   DOB: May 26, 1946, 72 y.o.   MRN: [id number]   [name] 01/13/2017, 12:22 PM Viewed order from doctor's order list

## 2017-01-13 NOTE — Anesthesia Postprocedure Evaluation (Signed)
Anesthesia Post Note  Patient: Andrew [name]  Procedure(s) Performed: Procedure(s) (LRB): GRAFT APPLICATION  RIGHT UPPER ARM FROM DONOR SITE (Right) APPLICATION OF A-CELL OF EXTREMITY (Right) SKIN GRAFT SPLIT THICKNESS (Right)  Patient location during evaluation: PACU Anesthesia Type: General Level of consciousness: awake and alert Pain management: pain level controlled Vital Signs Assessment: post-procedure vital signs reviewed and stable Respiratory status: spontaneous breathing, nonlabored ventilation, respiratory function stable and patient connected to nasal cannula oxygen Cardiovascular status: blood pressure returned to baseline and stable Postop Assessment: no signs of nausea or vomiting Anesthetic complications: no       Last Vitals:  Vitals:   01/13/17 1130 01/13/17 1146  BP:  (!) 144/72  Pulse:  65  Resp:    Temp: 36.4 C     Last Pain:  Vitals:   01/13/17 1146  TempSrc:   PainSc: 2                  Tiajuana Amass

## 2017-01-14 ENCOUNTER — Encounter (HOSPITAL_COMMUNITY): Payer: Self-pay | Admitting: Plastic Surgery

## 2017-01-15 NOTE — Anesthesia Postprocedure Evaluation (Signed)
Anesthesia Post Note  Patient: Andrew [name]  Procedure(s) Performed: Procedure(s) (LRB): APPLICATION OF A-CELL OF EXTREMITY (Right) APPLICATION OF WOUND VAC (Right) IRRIGATION AND DEBRIDEMENT WOUND (Right)     Anesthesia Post Evaluation  Last Vitals:  Vitals:   11/19/16 1045 11/19/16 1100  BP: (!) 146/64 (!) 152/69  Pulse: 64 69  Resp: 15 14  Temp:      Last Pain:  Vitals:   11/19/16 0922  TempSrc:   PainSc: 0-No pain                 Effie Berkshire

## 2017-01-15 NOTE — Addendum Note (Signed)
Addendum  created 01/15/17 1146 by Effie Berkshire, MD   Sign clinical note

## 2017-01-15 NOTE — Addendum Note (Signed)
Addendum  created 01/15/17 1143 by Effie Berkshire, MD   Sign clinical note

## 2017-01-18 NOTE — Addendum Note (Signed)
Addendum  created 01/18/17 1130 by Oleta Mouse, MD   Sign clinical note

## 2017-10-10 ENCOUNTER — Inpatient Hospital Stay (HOSPITAL_COMMUNITY)
Admission: EM | Admit: 2017-10-10 | Discharge: 2017-10-21 | DRG: 853 | Disposition: A | Discharge status: Skilled Nursing Facility | Payer: Medicare Other | Attending: Internal Medicine | Admitting: Internal Medicine

## 2017-10-10 ENCOUNTER — Encounter (HOSPITAL_COMMUNITY): Payer: Self-pay

## 2017-10-10 ENCOUNTER — Emergency Department (HOSPITAL_COMMUNITY): Payer: Medicare Other

## 2017-10-10 DIAGNOSIS — Z720 Tobacco use: Secondary | ICD-10-CM | POA: Diagnosis present

## 2017-10-10 DIAGNOSIS — I70261 Atherosclerosis of native arteries of extremities with gangrene, right leg: Secondary | ICD-10-CM

## 2017-10-10 DIAGNOSIS — K5900 Constipation, unspecified: Secondary | ICD-10-CM | POA: Diagnosis not present

## 2017-10-10 DIAGNOSIS — E872 Acidosis: Secondary | ICD-10-CM | POA: Diagnosis present

## 2017-10-10 DIAGNOSIS — L03115 Cellulitis of right lower limb: Secondary | ICD-10-CM | POA: Diagnosis present

## 2017-10-10 DIAGNOSIS — D638 Anemia in other chronic diseases classified elsewhere: Secondary | ICD-10-CM | POA: Diagnosis present

## 2017-10-10 DIAGNOSIS — E871 Hypo-osmolality and hyponatremia: Secondary | ICD-10-CM | POA: Diagnosis present

## 2017-10-10 DIAGNOSIS — E1152 Type 2 diabetes mellitus with diabetic peripheral angiopathy with gangrene: Secondary | ICD-10-CM | POA: Diagnosis present

## 2017-10-10 DIAGNOSIS — L0390 Cellulitis, unspecified: Secondary | ICD-10-CM | POA: Diagnosis not present

## 2017-10-10 DIAGNOSIS — I10 Essential (primary) hypertension: Secondary | ICD-10-CM | POA: Diagnosis present

## 2017-10-10 DIAGNOSIS — M79671 Pain in right foot: Secondary | ICD-10-CM | POA: Diagnosis present

## 2017-10-10 DIAGNOSIS — N492 Inflammatory disorders of scrotum: Secondary | ICD-10-CM | POA: Diagnosis not present

## 2017-10-10 DIAGNOSIS — H548 Legal blindness, as defined in USA: Secondary | ICD-10-CM | POA: Diagnosis present

## 2017-10-10 DIAGNOSIS — Z7982 Long term (current) use of aspirin: Secondary | ICD-10-CM

## 2017-10-10 DIAGNOSIS — Z7189 Other specified counseling: Secondary | ICD-10-CM | POA: Diagnosis not present

## 2017-10-10 DIAGNOSIS — D509 Iron deficiency anemia, unspecified: Secondary | ICD-10-CM | POA: Diagnosis present

## 2017-10-10 DIAGNOSIS — E785 Hyperlipidemia, unspecified: Secondary | ICD-10-CM | POA: Diagnosis present

## 2017-10-10 DIAGNOSIS — Z833 Family history of diabetes mellitus: Secondary | ICD-10-CM

## 2017-10-10 DIAGNOSIS — F17210 Nicotine dependence, cigarettes, uncomplicated: Secondary | ICD-10-CM | POA: Diagnosis present

## 2017-10-10 DIAGNOSIS — Z66 Do not resuscitate: Secondary | ICD-10-CM | POA: Diagnosis present

## 2017-10-10 DIAGNOSIS — Z23 Encounter for immunization: Secondary | ICD-10-CM

## 2017-10-10 DIAGNOSIS — T148XXA Other injury of unspecified body region, initial encounter: Secondary | ICD-10-CM

## 2017-10-10 DIAGNOSIS — E440 Moderate protein-calorie malnutrition: Secondary | ICD-10-CM | POA: Diagnosis present

## 2017-10-10 DIAGNOSIS — B370 Candidal stomatitis: Secondary | ICD-10-CM | POA: Diagnosis not present

## 2017-10-10 DIAGNOSIS — I96 Gangrene, not elsewhere classified: Secondary | ICD-10-CM | POA: Diagnosis not present

## 2017-10-10 DIAGNOSIS — Z6825 Body mass index (BMI) 25.0-25.9, adult: Secondary | ICD-10-CM

## 2017-10-10 DIAGNOSIS — E1143 Type 2 diabetes mellitus with diabetic autonomic (poly)neuropathy: Secondary | ICD-10-CM | POA: Diagnosis present

## 2017-10-10 DIAGNOSIS — Z89611 Acquired absence of right leg above knee: Secondary | ICD-10-CM

## 2017-10-10 DIAGNOSIS — Z515 Encounter for palliative care: Secondary | ICD-10-CM | POA: Diagnosis not present

## 2017-10-10 DIAGNOSIS — L089 Local infection of the skin and subcutaneous tissue, unspecified: Secondary | ICD-10-CM

## 2017-10-10 DIAGNOSIS — A419 Sepsis, unspecified organism: Principal | ICD-10-CM | POA: Diagnosis present

## 2017-10-10 DIAGNOSIS — M726 Necrotizing fasciitis: Secondary | ICD-10-CM | POA: Diagnosis present

## 2017-10-10 DIAGNOSIS — Z794 Long term (current) use of insulin: Secondary | ICD-10-CM | POA: Diagnosis not present

## 2017-10-10 DIAGNOSIS — R52 Pain, unspecified: Secondary | ICD-10-CM

## 2017-10-10 LAB — SEDIMENTATION RATE: Sed Rate: 92 mm/hr — ABNORMAL HIGH (ref 0–16)

## 2017-10-10 LAB — COMPREHENSIVE METABOLIC PANEL
ALT: 27 U/L (ref 17–63)
ANION GAP: 13 (ref 5–15)
AST: 50 U/L — ABNORMAL HIGH (ref 15–41)
Albumin: 2.4 g/dL — ABNORMAL LOW (ref 3.5–5.0)
Alkaline Phosphatase: 178 U/L — ABNORMAL HIGH (ref 38–126)
BUN: 14 mg/dL (ref 6–20)
CO2: 24 mmol/L (ref 22–32)
Calcium: 8.8 mg/dL — ABNORMAL LOW (ref 8.9–10.3)
Chloride: 92 mmol/L — ABNORMAL LOW (ref 101–111)
Creatinine, Ser: 0.79 mg/dL (ref 0.61–1.24)
GFR calc non Af Amer: >60 mL/min (ref >60)
Glucose, Bld: 295 mg/dL — ABNORMAL HIGH (ref 65–99)
Potassium: 4.5 mmol/L (ref 3.5–5.1)
SODIUM: 129 mmol/L — AB (ref 135–145)
Total Bilirubin: 0.5 mg/dL (ref 0.3–1.2)
Total Protein: 6.6 g/dL (ref 6.5–8.1)

## 2017-10-10 LAB — CBC WITH DIFFERENTIAL/PLATELET
BASOS PCT: 0 %
Basophils Absolute: 0 10*3/uL (ref 0.0–0.1)
Eosinophils Absolute: 0 10*3/uL (ref 0.0–0.7)
Eosinophils Relative: 0 %
HEMATOCRIT: 33.6 % — AB (ref 39.0–52.0)
Hemoglobin: 11.3 g/dL — ABNORMAL LOW (ref 13.0–17.0)
LYMPHS PCT: 4 %
Lymphs Abs: 1.4 10*3/uL (ref 0.7–4.0)
MCH: 28 pg (ref 26.0–34.0)
MCHC: 33.6 g/dL (ref 30.0–36.0)
MCV: 83.2 fL (ref 78.0–100.0)
Monocytes Absolute: 2.4 10*3/uL — ABNORMAL HIGH (ref 0.1–1.0)
Monocytes Relative: 7 %
NEUTROS PCT: 89 %
Neutro Abs: 30.8 10*3/uL — ABNORMAL HIGH (ref 1.7–7.7)
PLATELETS: 670 10*3/uL — AB (ref 150–400)
RBC: 4.04 MIL/uL — ABNORMAL LOW (ref 4.22–5.81)
RDW: 15.2 % (ref 11.5–15.5)
WBC: 34.6 10*3/uL — ABNORMAL HIGH (ref 4.0–10.5)

## 2017-10-10 LAB — PROCALCITONIN: Procalcitonin: 0.16 ng/mL

## 2017-10-10 LAB — LACTIC ACID, PLASMA: Lactic Acid, Venous: 1.3 mmol/L (ref 0.5–1.9)

## 2017-10-10 LAB — I-STAT CG4 LACTIC ACID, ED
Lactic Acid, Venous: 2.11 mmol/L (ref 0.5–1.9)
Lactic Acid, Venous: 2.05 mmol/L (ref 0.5–1.9)

## 2017-10-10 LAB — C-REACTIVE PROTEIN: CRP: 13.5 mg/dL — AB (ref <1.0)

## 2017-10-10 LAB — MRSA PCR SCREENING: MRSA by PCR: POSITIVE — AB

## 2017-10-10 LAB — GLUCOSE, CAPILLARY: Glucose-Capillary: 308 mg/dL — ABNORMAL HIGH (ref 65–99)

## 2017-10-10 MED ORDER — GABAPENTIN 100 MG PO CAPS
100.0000 mg | ORAL_CAPSULE | Freq: Three times a day (TID) | ORAL | Status: DC
  Administered 2017-10-10 – 2017-10-21 (×32): 100 mg via ORAL
  Filled 2017-10-10 (×32): qty 1

## 2017-10-10 MED ORDER — TETANUS-DIPHTH-ACELL PERTUSSIS 5-2.5-18.5 LF-MCG/0.5 IM SUSP
0.5000 mL | Freq: Once | INTRAMUSCULAR | Status: AC
  Administered 2017-10-10: 0.5 mL via INTRAMUSCULAR
  Filled 2017-10-10: qty 0.5

## 2017-10-10 MED ORDER — VANCOMYCIN HCL IN DEXTROSE 750-5 MG/150ML-% IV SOLN
750.0000 mg | Freq: Two times a day (BID) | INTRAVENOUS | Status: DC
  Administered 2017-10-11 – 2017-10-14 (×7): 750 mg via INTRAVENOUS
  Filled 2017-10-10 (×8): qty 150

## 2017-10-10 MED ORDER — SODIUM CHLORIDE 0.9 % IV SOLN
2.0000 g | Freq: Three times a day (TID) | INTRAVENOUS | Status: DC
  Administered 2017-10-11 (×2): 2 g via INTRAVENOUS
  Filled 2017-10-10 (×2): qty 2

## 2017-10-10 MED ORDER — SODIUM CHLORIDE 0.9 % IV SOLN
2.0000 g | Freq: Once | INTRAVENOUS | Status: AC
  Administered 2017-10-10: 2 g via INTRAVENOUS
  Filled 2017-10-10: qty 2

## 2017-10-10 MED ORDER — SODIUM CHLORIDE 0.9% FLUSH
3.0000 mL | Freq: Two times a day (BID) | INTRAVENOUS | Status: DC
  Administered 2017-10-10 – 2017-10-18 (×8): 3 mL via INTRAVENOUS
  Administered 2017-10-19: 10 mL via INTRAVENOUS
  Administered 2017-10-19 – 2017-10-20 (×3): 3 mL via INTRAVENOUS

## 2017-10-10 MED ORDER — ACETAMINOPHEN 325 MG PO TABS: 650.0000 mg | ORAL_TABLET | Freq: Four times a day (QID) | ORAL | Status: DC | PRN

## 2017-10-10 MED ORDER — METRONIDAZOLE IN NACL 5-0.79 MG/ML-% IV SOLN
500.0000 mg | Freq: Three times a day (TID) | INTRAVENOUS | Status: DC
  Administered 2017-10-10 – 2017-10-12 (×4): 500 mg via INTRAVENOUS
  Filled 2017-10-10 (×5): qty 100

## 2017-10-10 MED ORDER — VANCOMYCIN HCL 10 G IV SOLR
1500.0000 mg | Freq: Once | INTRAVENOUS | Status: AC
  Administered 2017-10-10: 1500 mg via INTRAVENOUS
  Filled 2017-10-10: qty 1500

## 2017-10-10 MED ORDER — INSULIN GLARGINE 100 UNIT/ML ~~LOC~~ SOLN
15.0000 [IU] | Freq: Every day | SUBCUTANEOUS | Status: DC
  Administered 2017-10-10 – 2017-10-12 (×3): 15 [IU] via SUBCUTANEOUS
  Filled 2017-10-10 (×4): qty 0.15

## 2017-10-10 MED ORDER — NICOTINE 21 MG/24HR TD PT24
21.0000 mg | MEDICATED_PATCH | Freq: Every day | TRANSDERMAL | Status: DC
  Administered 2017-10-10 – 2017-10-20 (×7): 21 mg via TRANSDERMAL
  Filled 2017-10-10 (×9): qty 1

## 2017-10-10 MED ORDER — ONDANSETRON HCL 4 MG PO TABS
4.0000 mg | ORAL_TABLET | Freq: Four times a day (QID) | ORAL | Status: DC | PRN
  Administered 2017-10-14: 4 mg via ORAL
  Filled 2017-10-10: qty 1

## 2017-10-10 MED ORDER — ENSURE ENLIVE PO LIQD
237.0000 mL | ORAL | Status: DC
  Administered 2017-10-12 – 2017-10-15 (×3): 237 mL via ORAL

## 2017-10-10 MED ORDER — METRONIDAZOLE IN NACL 5-0.79 MG/ML-% IV SOLN: 500.0000 mg | Freq: Three times a day (TID) | INTRAVENOUS | Status: DC

## 2017-10-10 MED ORDER — TRAZODONE HCL 50 MG PO TABS
25.0000 mg | ORAL_TABLET | Freq: Every evening | ORAL | Status: DC | PRN
Start: 2017-10-10 — End: 2017-10-21
  Administered 2017-10-10 – 2017-10-18 (×5): 25 mg via ORAL
  Administered 2017-10-19: 50 mg via ORAL
  Administered 2017-10-20: 25 mg via ORAL
  Filled 2017-10-10 (×7): qty 1

## 2017-10-10 MED ORDER — SENNOSIDES-DOCUSATE SODIUM 8.6-50 MG PO TABS: 1.0000 | ORAL_TABLET | Freq: Every evening | ORAL | Status: DC | PRN

## 2017-10-10 MED ORDER — SODIUM CHLORIDE 0.9 % IV SOLN
INTRAVENOUS | Status: DC
  Administered 2017-10-10 (×2): via INTRAVENOUS

## 2017-10-10 MED ORDER — ONDANSETRON HCL 4 MG/2ML IJ SOLN: 4.0000 mg | Freq: Four times a day (QID) | INTRAMUSCULAR | Status: DC | PRN

## 2017-10-10 MED ORDER — INSULIN ASPART 100 UNIT/ML ~~LOC~~ SOLN
0.0000 [IU] | Freq: Three times a day (TID) | SUBCUTANEOUS | Status: DC
  Administered 2017-10-11 (×2): 2 [IU] via SUBCUTANEOUS
  Administered 2017-10-11: 1 [IU] via SUBCUTANEOUS
  Administered 2017-10-12 (×2): 2 [IU] via SUBCUTANEOUS
  Administered 2017-10-12: 3 [IU] via SUBCUTANEOUS
  Administered 2017-10-13: 2 [IU] via SUBCUTANEOUS
  Administered 2017-10-13: 1 [IU] via SUBCUTANEOUS
  Administered 2017-10-13: 2 [IU] via SUBCUTANEOUS
  Administered 2017-10-14: 7 [IU] via SUBCUTANEOUS
  Administered 2017-10-14: 3 [IU] via SUBCUTANEOUS
  Administered 2017-10-14: 7 [IU] via SUBCUTANEOUS
  Administered 2017-10-15: 1 [IU] via SUBCUTANEOUS
  Administered 2017-10-15: 7 [IU] via SUBCUTANEOUS
  Administered 2017-10-15: 3 [IU] via SUBCUTANEOUS
  Administered 2017-10-16: 2 [IU] via SUBCUTANEOUS
  Administered 2017-10-16: 3 [IU] via SUBCUTANEOUS
  Administered 2017-10-17: 1 [IU] via SUBCUTANEOUS
  Administered 2017-10-17 (×2): 2 [IU] via SUBCUTANEOUS
  Administered 2017-10-18: 3 [IU] via SUBCUTANEOUS
  Administered 2017-10-18: 6 [IU] via SUBCUTANEOUS
  Administered 2017-10-19: 9 [IU] via SUBCUTANEOUS
  Administered 2017-10-19: 3 [IU] via SUBCUTANEOUS
  Administered 2017-10-19: 2 [IU] via SUBCUTANEOUS
  Administered 2017-10-20 (×2): 3 [IU] via SUBCUTANEOUS
  Administered 2017-10-20 – 2017-10-21 (×3): 2 [IU] via SUBCUTANEOUS
  Administered 2017-10-21: 3 [IU] via SUBCUTANEOUS

## 2017-10-10 MED ORDER — SIMVASTATIN 40 MG PO TABS
40.0000 mg | ORAL_TABLET | Freq: Every day | ORAL | Status: DC
  Administered 2017-10-11 – 2017-10-12 (×2): 40 mg via ORAL
  Filled 2017-10-10 (×2): qty 1

## 2017-10-10 MED ORDER — VITAMIN B-1 100 MG PO TABS
100.0000 mg | ORAL_TABLET | Freq: Every day | ORAL | Status: DC
  Administered 2017-10-10 – 2017-10-21 (×11): 100 mg via ORAL
  Filled 2017-10-10 (×11): qty 1

## 2017-10-10 MED ORDER — HYDROCODONE-ACETAMINOPHEN 5-325 MG PO TABS
1.0000 | ORAL_TABLET | ORAL | Status: DC | PRN
  Administered 2017-10-10 – 2017-10-12 (×6): 2 via ORAL
  Administered 2017-10-13: 1 via ORAL
  Administered 2017-10-13 – 2017-10-15 (×6): 2 via ORAL
  Filled 2017-10-10 (×13): qty 2

## 2017-10-10 MED ORDER — INSULIN ASPART 100 UNIT/ML ~~LOC~~ SOLN
0.0000 [IU] | Freq: Every day | SUBCUTANEOUS | Status: DC
  Administered 2017-10-10: 5 [IU] via SUBCUTANEOUS
  Administered 2017-10-11 – 2017-10-15 (×3): 2 [IU] via SUBCUTANEOUS
  Administered 2017-10-19: 4 [IU] via SUBCUTANEOUS

## 2017-10-10 MED ORDER — ACETAMINOPHEN 650 MG RE SUPP: 650.0000 mg | Freq: Four times a day (QID) | RECTAL | Status: DC | PRN

## 2017-10-10 MED ORDER — FENTANYL CITRATE (PF) 100 MCG/2ML IJ SOLN
100.0000 ug | Freq: Once | INTRAMUSCULAR | Status: AC
  Administered 2017-10-10: 100 ug via INTRAVENOUS
  Filled 2017-10-10: qty 2

## 2017-10-10 MED ORDER — ASPIRIN EC 325 MG PO TBEC
325.0000 mg | DELAYED_RELEASE_TABLET | Freq: Every day | ORAL | Status: DC
  Administered 2017-10-11 – 2017-10-21 (×10): 325 mg via ORAL
  Filled 2017-10-10 (×10): qty 1

## 2017-10-10 NOTE — Progress Notes (Addendum)
Pharmacy Antibiotic Note  Andrew [name] is a 72 y.o. male admitted on 10/10/2017 with cellulitis.  Pharmacy has been consulted for vancomycin dosing. Pt failed outpatient Keflex. WBC 34.6. LA 2.05, CrCl ~ 60-65 mL/min  Plan: -Vancomycin 1500 mg IV once, then vancomycin 750 mg IV Q 12 hours. Vanc trough goal 10-15 mcg/mL -Monitor CBC, renal fx, cultures and clinical progress -VT at SS      Temp (24hrs), Avg:99.5 F (37.5 C), Min:99.5 F (37.5 C), Max:99.5 F (37.5 C)  Recent Labs  Lab 10/10/17 1451 10/10/17 1517  WBC 34.6*  --   CREATININE 0.79  --   LATICACIDVEN  --  2.11*    CrCl cannot be calculated (Unknown ideal weight.).    Allergies  Allergen Reactions  . Penicillins Rash and Other (See Comments)    UNSPECIFIED REACTION DESCRIPTION Tolerated Zosyn Jan 2018  Possibly rash? Has patient had a PCN reaction causing immediate rash, facial/tongue/throat swelling, SOB or lightheadedness with hypotension: YES Has patient had a PCN reaction causing severe rash involving mucus membranes or skin necrosis:NO Has patient had a PCN reaction that required hospitalization NO Has patient had a PCN reaction occurring within the last 10 years:NO If all of the above answers are "NO", then may proceed with Cephal    Antimicrobials this admission: Cefepime 2/24 >>  Vanc 2/24 >>   Dose adjustments this admission: None   Microbiology results:   Thank you for allowing pharmacy to be a part of this patient's care.  Albertina Parr, PharmD., BCPS Clinical Pharmacist Pager [phone]  Addendum: Now adding maintenance gram negative coverage with Cefepime. Start Cefepime 2 gm IV Q 8 hours.   Albertina Parr, PharmD., BCPS Clinical Pharmacist Pager [phone]

## 2017-10-10 NOTE — ED Provider Notes (Signed)
Complains of painful wound on right foot since 09/30/2016 as well as painful fingers of his right hand.  He is been treated with Keflex for the past 10 days by his PMD, symptoms continue to worsen.  No  fever.   Orlie Dakin, MD 10/10/17 [phone]

## 2017-10-10 NOTE — Plan of Care (Addendum)
Pt resting in bed with alarm on, linens changed, R leg wrapped in gauze and kerlix,  MRSA precautions, MRSA positive nares PCR, will be NPO at midnight.

## 2017-10-10 NOTE — ED Triage Notes (Signed)
Pt presents for evaluation of large wound to anterior R foot. Pt reports wound has been managed via PCP and home health, last visit on Thursday. Leg is red with black wound.

## 2017-10-10 NOTE — ED Provider Notes (Signed)
[facility] EMERGENCY DEPARTMENT Provider Note   CSN: [id number] Arrival date & time: 10/10/17  1439     History   Chief Complaint Chief Complaint  Patient presents with  . Wound Infection    HPI Andrew [name] is a 72 y.o. male.  Patient with a history T2DM with wound infection and MRSA, HTN, HLD presents with daughter and concerned for worsening infection of right Andrew. He reports wound to foot about 3 weeks ago that spread to lower leg being managed by PCP and in-home care. He does not remember when the last dressing change was. He was given a Rx for Keflex on 09/30/17 and was compliant but without improvement in infection. He reports the pain is getting worse. He denies fever, nausea, or vomiting.    The history is provided by the patient and a relative. No language interpreter was used.    Past Medical History:  Diagnosis Date  . Blind right eye    S/P shotgun  . Bursitis    right Olecranon  . Chronic bronchitis ([place])   . Diabetic neuropathy ([place])   . Hyperlipemia   . Hypertension   . 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 Type II diabetes mellitus ([place])   . Wound cellulitis    right elbow open    Patient Active Problem List   Diagnosis Date Noted  . Arm wound, right, initial encounter 11/19/2016  . Soft tissue infection   . Gram-negative infection   . Protein-calorie malnutrition, severe 09/02/2016  . Elevated troponin 08/31/2016  . Pressure injury of skin 08/31/2016  . Sepsis ([place]) 08/30/2016  . Chest pain 08/30/2016  . AKI (acute kidney injury) ([place]) 08/30/2016  . Chronic diastolic CHF (congestive heart failure) ([place]) 08/30/2016  . Septic olecranon bursitis of right elbow   . Essential hypertension   . Hypokalemia 04/12/2015  . 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 Hypomagnesemia   . Bacteremia due to methicillin resistant Staphylococcus aureus 04/10/2015  .  Atherosclerotic peripheral vascular disease ([place]) 04/10/2015  . Acute bronchitis 04/09/2015  . Hypertension 04/09/2015  . Hyperlipidemia 04/09/2015  . Alcohol abuse 04/09/2015  . Tobacco abuse disorder 04/09/2015  . Acute encephalopathy     Past Surgical History:  Procedure Laterality Date  . APPLICATION OF A-CELL OF EXTREMITY Right 10/15/2016   Procedure: APPLICATION OF A-CELL OF EXTREMITY;  Surgeon: Loel Lofty Dillingham, DO;  Location: [facility];  Service: Plastics;  Laterality: Right;  . APPLICATION OF A-CELL OF EXTREMITY Right 11/19/2016   Procedure: APPLICATION OF A-CELL OF EXTREMITY;  Surgeon: Loel Lofty Dillingham, DO;  Location: [facility];  Service: Plastics;  Laterality: Right;  . APPLICATION OF A-CELL OF EXTREMITY Right 01/13/2017   Procedure: APPLICATION OF A-CELL OF EXTREMITY;  Surgeon: Wallace Going, DO;  Location: [facility];  Service: Plastics;  Laterality: Right;  . APPLICATION OF WOUND VAC Right 10/09/2016   Procedure: APPLICATION OF WOUND VAC;  Surgeon: Leandrew Koyanagi, MD;  Location: [facility];  Service: Orthopedics;  Laterality: Right;  . APPLICATION OF WOUND VAC Right 10/15/2016   Procedure: WOUND VAC CHANGE;  Surgeon: Loel Lofty Dillingham, DO;  Location: [facility];  Service: Plastics;  Laterality: Right;  . APPLICATION OF WOUND VAC Right 11/19/2016   Procedure: APPLICATION OF WOUND VAC;  Surgeon: Loel Lofty Dillingham, DO;  Location: [facility];  Service: Plastics;  Laterality: Right;  . GRAFT APPLICATION Right 9/37/1696   Procedure: GRAFT APPLICATION  RIGHT UPPER ARM FROM DONOR SITE;  Surgeon: Wallace Going, DO;  Location: [facility];  Service: Plastics;  Laterality: Right;  . I&D EXTREMITY Right 10/09/2016   Procedure: IRRIGATION AND DEBRIDEMENT EXTREMITY RIGHT ARM WOUND;  Surgeon: Leandrew Koyanagi, MD;  Location: [facility];  Service: Orthopedics;  Laterality: Right;  . I&D EXTREMITY N/A 10/11/2016   Procedure: IRRIGATION AND DEBRIDEMENT EXTREMITY RIGHT ARM WOUND;  Surgeon: Leandrew Koyanagi, MD;  Location: [facility];   Service: Orthopedics;  Laterality: N/A;  . I&D EXTREMITY Right 10/15/2016   Procedure: IRRIGATION AND DEBRIDEMENT EXTREMITY/RIGHT ELBOW;  Surgeon: Loel Lofty Dillingham, DO;  Location: [facility];  Service: Plastics;  Laterality: Right;  . INCISION AND DRAINAGE OF WOUND Right 10/09/2016   arm  . INCISION AND DRAINAGE OF WOUND Right 11/19/2016   Procedure: IRRIGATION AND DEBRIDEMENT WOUND;  Surgeon: Loel Lofty Dillingham, DO;  Location: [facility];  Service: Plastics;  Laterality: Right;  . SKIN SPLIT GRAFT Right 01/13/2017   Procedure: SKIN GRAFT SPLIT THICKNESS;  Surgeon: Wallace Going, DO;  Location: [facility];  Service: Plastics;  Laterality: Right;       Home Medications    Prior to Admission medications   Medication Sig Start Date End Date Taking? Authorizing Provider  amLODipine (NORVASC) 10 MG tablet Take 1 tablet (10 mg total) by mouth daily. 04/15/15   Eugenie Filler, MD  aspirin 325 MG tablet Take 325 mg by mouth daily.    [provider]  ciprofloxacin (CIPRO) 750 MG tablet Take 1 tablet (750 mg total) by mouth 2 (two) times daily. 10/16/16   Ghimire, Henreitta Leber, MD  feeding supplement, ENSURE ENLIVE, (ENSURE ENLIVE) LIQD Take 237 mLs by mouth daily. Patient taking differently: Take 237 mLs by mouth 3 (three) times daily between meals.  09/15/16   Oswald Hillock, MD  furosemide (LASIX) 20 MG tablet Take 20 mg by mouth daily. 10/29/16   [provider]  gabapentin (NEURONTIN) 100 MG capsule Take 100 mg by mouth 3 (three) times daily.    [provider]  HYDROcodone-acetaminophen (NORCO) 5-325 MG tablet Take 1 tablet by mouth every 12 (twelve) hours as needed for moderate pain. 11/19/16   Dillingham, Loel Lofty, DO  HYDROcodone-acetaminophen (NORCO) 5-325 MG tablet Take 1 tablet by mouth every 6 (six) hours as needed for moderate pain. 01/13/17   Rayburn, Neta Mends, PA-C  insulin aspart (NOVOLOG) 100 UNIT/ML injection Sliding scale insulin Less than 70 initiate  hypoglycemia protocol 70-120  0 units 120-150 1 unit 151-200 2 units 201-250 3 units 251-300 5 units 301-350 7 units 351-400 9 units  Greater than 400 call MD Patient not taking: Reported on 11/19/2016 09/15/16   Oswald Hillock, MD  insulin glargine (LANTUS) 100 UNIT/ML injection Inject 0.16 mLs (16 Units total) into the skin at bedtime. Patient not taking: Reported on 11/19/2016 10/16/16   Jonetta Osgood, MD  Insulin Glargine (TOUJEO SOLOSTAR) 300 UNIT/ML SOPN Inject 60 Units into the skin daily.    [provider]  metFORMIN (GLUCOPHAGE-XR) 500 MG 24 hr tablet Take 1,000 mg by mouth 2 (two) times daily.    [provider]  metoprolol succinate (TOPROL-XL) 100 MG 24 hr tablet Take 1 tablet (100 mg total) by mouth daily. Take with or immediately following a meal. Patient taking differently: Take 50 mg by mouth daily. Take with or immediately following a meal. 09/16/16   Oswald Hillock, MD  oxyCODONE-acetaminophen (PERCOCET/ROXICET) 5-325 MG tablet Take 1-2 tablets  by mouth every 6 (six) hours as needed for moderate pain or severe pain. 10/16/16   Ghimire, Henreitta Leber, MD  simvastatin (ZOCOR) 40 MG tablet Take 40 mg by mouth daily.    [provider]  sitaGLIPtin (JANUVIA) 100 MG tablet Take 100 mg by mouth daily.    [provider]  thiamine 100 MG tablet Take 1 tablet (100 mg total) by mouth daily. 09/16/16   Oswald Hillock, MD    Family History Family History  Problem Relation Age of Onset  . Diabetes Mellitus II Mother   . Diabetes Mellitus II Father   . Diabetes Mellitus II Sister   . Diabetes Mellitus II Sister     Social History Social History   Tobacco Use  . Smoking status: Current Every Day Smoker    Packs/day: 1.50    Years: 63.00    Pack years: 94.50    Types: Cigarettes  . Smokeless tobacco: Former Systems developer    Types: Chew  Substance Use Topics  . Alcohol use: Yes    Comment: 01/12/2017 "nothing in 3 years"  . Drug use: No     Allergies     Penicillins   Review of Systems Review of Systems  Constitutional: Negative for chills and fever.  HENT: Negative.   Respiratory: Negative.  Negative for cough and shortness of breath.   Cardiovascular: Negative.  Negative for chest pain.  Gastrointestinal: Negative.  Negative for abdominal pain and nausea.  Musculoskeletal:       See HPI.  Skin: Positive for color change and wound.  Neurological: Negative.      Physical Exam Updated Vital Signs BP (!) 124/58   Pulse 86   Temp 99.5 F (37.5 C) (Oral)   Resp 19   SpO2 100%   Physical Exam  Constitutional: He is oriented to person, place, and time. He appears well-developed and well-nourished.  Neck: Normal range of motion.  Pulmonary/Chest: Effort normal.  Musculoskeletal: Normal range of motion.  Right lower extremity has an elongated wound with eschar from distal, anterior lower leg to dorsum of foot. No active drainage. There is moderate surrounding erythema.   Neurological: He is alert and oriented to person, place, and time.  Skin: Skin is warm and dry. Capillary refill takes 2 to 3 seconds.  Psychiatric: He has a normal mood and affect.     ED Treatments / Results  Labs (all labs ordered are listed, but only abnormal results are displayed) Labs Reviewed  COMPREHENSIVE METABOLIC PANEL - Abnormal; Notable for the following components:      Result Value   Sodium 129 (*)    Chloride 92 (*)    Glucose, Bld 295 (*)    Calcium 8.8 (*)    Albumin 2.4 (*)    AST 50 (*)    Alkaline Phosphatase 178 (*)    All other components within normal limits  CBC WITH DIFFERENTIAL/PLATELET - Abnormal; Notable for the following components:   WBC 34.6 (*)    RBC 4.04 (*)    Hemoglobin 11.3 (*)    HCT 33.6 (*)    Platelets 670 (*)    Neutro Abs 30.8 (*)    Monocytes Absolute 2.4 (*)    All other components within normal limits  I-STAT CG4 LACTIC ACID, ED - Abnormal; Notable for the following components:   Lactic Acid,  Venous 2.11 (*)    All other components within normal limits  I-STAT CG4 LACTIC ACID, ED    EKG  EKG Interpretation None       Radiology No results found.  Procedures Procedures (including critical care time)  Medications Ordered in ED Medications  ceFEPIme (MAXIPIME) 2 g in sodium chloride 0.9 % 100 mL IVPB (not administered)  fentaNYL (SUBLIMAZE) injection 100 mcg (not administered)  Tdap (BOOSTRIX) injection 0.5 mL (not administered)  vancomycin (VANCOCIN) 1,500 mg in sodium chloride 0.9 % 500 mL IVPB (not administered)     Initial Impression / Assessment and Plan / ED Course  I have reviewed the triage vital signs and the nursing notes.  Pertinent labs & imaging results that were available during my care of the patient were reviewed by me and considered in my medical decision making (see chart for details).     The patient presents with concern for worsening pain in right Andrew associated with nonhealing wound, and failure of outpatient abx therapy after 10 days of Keflex.   He has a very high WBC count of 34K, elevated lactic acid, althought, normal VS. Discussed with [facility] who accept the patient for admission to their service.     Final Clinical Impressions(s) / ED Diagnoses   Final diagnoses:  None   1. Cellulitis right Andrew 2. Nonhealing wound right Andrew 3. Leukocytosis 4. H/O MRSA  ED Discharge Orders    None       Charlann Lange, Hershal Coria 10/10/17 [place], MD 10/11/17 [phone]

## 2017-10-10 NOTE — H&P (Signed)
History and Physical  Andrew [name]:[id number] DOB: 1945-09-07 DOA: 10/10/2017  Referring physician: ED PCP: Christain Sacramento, MD  Outpatient Specialists:  Patient coming from: Home & is able to ambulate  Chief Complaint: My Right leg is infected  HPI: Andrew [name] is a 72 y.o. male with medical history significant for Type 2 DM, HTN, HLD, R eye blindness, tobacco abuse presents to the ED c/o worsening RLE cellulitis. Pt reported he sustained a wound to the foot about 3 weeks ago, that gradually spread to his lower leg. Pt reported his PCP prescribed PO keflex for 10 days of which he was compliant to, but didn't help with the infection. Pt denies any fever/chills, nausea/vomiting, chest pain, SOB, abdominal pain. Of note, pt has had recurrent cellulitis with very poor wound healing. Last R arm infection, required a skin graft. The was noted to be very foul smelling/wet gangrene/eschar although no superficial drainage noted on the ant lower leg to dorsum of foot. Surrounding erythema noted.    ED Course: In the ED, pt was afebrile, WBC noted to be in the 30s, with lactic acidosis. VSS. Pt was given IVF and started on IV Vanc and Zosyn. Pt admitted for further management  Review of Systems: Review of systems are otherwise negative, except for those mentioned above   Past Medical History:  Diagnosis Date  . Blind right eye    S/P shotgun  . Bursitis    right [place]ran[place]  . Chronic bronchitis ([place])   . Diabetic neuropathy ([place])   . Hyperlipemia   . Hypertension   . 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 Type II diabetes mellitus ([place])   . Wound cellulitis    right elbow open   Past Surgical History:  Procedure Laterality Date  . APPLICATION OF A-CELL OF EXTREMITY Right 10/15/2016   Procedure: APPLICATION OF A-CELL OF EXTREMITY;  Surgeon: Loel Lofty Dillingham, DO;  Location: [place];  Service: Plastics;  Laterality: Right;  . APPLICATION OF A-CELL  OF EXTREMITY Right 11/19/2016   Procedure: APPLICATION OF A-CELL OF EXTREMITY;  Surgeon: Loel Lofty Dillingham, DO;  Location: [place];  Service: Plastics;  Laterality: Right;  . APPLICATION OF A-CELL OF EXTREMITY Right 01/13/2017   Procedure: APPLICATION OF A-CELL OF EXTREMITY;  Surgeon: Wallace Going, DO;  Location: [place];  Service: Plastics;  Laterality: Right;  . APPLICATION OF WOUND VAC Right 10/09/2016   Procedure: APPLICATION OF WOUND VAC;  Surgeon: Leandrew Koyanagi, MD;  Location: [place];  Service: Orthopedics;  Laterality: Right;  . APPLICATION OF WOUND VAC Right 10/15/2016   Procedure: WOUND VAC CHANGE;  Surgeon: Loel Lofty Dillingham, DO;  Location: [place];  Service: Plastics;  Laterality: Right;  . APPLICATION OF WOUND VAC Right 11/19/2016   Procedure: APPLICATION OF WOUND VAC;  Surgeon: Loel Lofty Dillingham, DO;  Location: [place];  Service: Plastics;  Laterality: Right;  . GRAFT APPLICATION Right 04/03/2992   Procedure: GRAFT APPLICATION  RIGHT UPPER ARM FROM DONOR SITE;  Surgeon: Wallace Going, DO;  Location: [place];  Service: Plastics;  Laterality: Right;  . I&D EXTREMITY Right 10/09/2016   Procedure: IRRIGATION AND DEBRIDEMENT EXTREMITY RIGHT ARM WOUND;  Surgeon: Leandrew Koyanagi, MD;  Location: [place];  Service: Orthopedics;  Laterality: Right;  . I&D EXTREMITY N/A 10/11/2016   Procedure: IRRIGATION AND DEBRIDEMENT EXTREMITY RIGHT ARM WOUND;  Surgeon: Leandrew Koyanagi, MD;  Location: [place];  Service: Orthopedics;  Laterality: N/A;  .  I&D EXTREMITY Right 10/15/2016   Procedure: IRRIGATION AND DEBRIDEMENT EXTREMITY/RIGHT ELBOW;  Surgeon: Loel Lofty Dillingham, DO;  Location: [place];  Service: Plastics;  Laterality: Right;  . INCISION AND DRAINAGE OF WOUND Right 10/09/2016   arm  . INCISION AND DRAINAGE OF WOUND Right 11/19/2016   Procedure: IRRIGATION AND DEBRIDEMENT WOUND;  Surgeon: Loel Lofty Dillingham, DO;  Location: [place];  Service: Plastics;  Laterality: Right;  . SKIN SPLIT GRAFT Right 01/13/2017    Procedure: SKIN GRAFT SPLIT THICKNESS;  Surgeon: Wallace Going, DO;  Location: [place];  Service: Plastics;  Laterality: Right;    Social History:  reports that he has been smoking cigarettes.  He has a 94.50 pack-year smoking history. He has quit using smokeless tobacco. His smokeless tobacco use included chew. He reports that he drinks alcohol. He reports that he does not use drugs.   Allergies  Allergen Reactions  . Penicillins Rash    Tolerated Zosyn Jan 2018  Possibly rash? Has patient had a PCN reaction causing immediate rash, facial/tongue/throat swelling, SOB or lightheadedness with hypotension: YES Has patient had a PCN reaction causing severe rash involving mucus membranes or skin necrosis:NO Has patient had a PCN reaction that required hospitalization NO Has patient had a PCN reaction occurring within the last 10 years:NO If all of the above answers are "NO", then may proceed with Cephal    Family History  Problem Relation Age of Onset  . Diabetes Mellitus II Mother   . Diabetes Mellitus II Father   . Diabetes Mellitus II Sister   . Diabetes Mellitus II Sister       Prior to Admission medications   Medication Sig Start Date End Date Taking? Authorizing Provider  amLODipine (NORVASC) 10 MG tablet Take 1 tablet (10 mg total) by mouth daily. 04/15/15  Yes Eugenie Filler, MD  aspirin EC 325 MG tablet Take 325 mg by mouth daily.   Yes [provider]  feeding supplement, ENSURE ENLIVE, (ENSURE ENLIVE) LIQD Take 237 mLs by mouth daily. Patient taking differently: Take 237 mLs by mouth daily.  09/15/16  Yes Oswald Hillock, MD  furosemide (LASIX) 20 MG tablet Take 20 mg by mouth daily. 10/29/16  Yes [provider]  gabapentin (NEURONTIN) 100 MG capsule Take 100 mg by mouth 3 (three) times daily.   Yes [provider]  ibuprofen (ADVIL,MOTRIN) 200 MG tablet Take 400 mg by mouth every 6 (six) hours as needed for headache (pain).   Yes [provider]  Insulin Glargine (TOUJEO SOLOSTAR) 300 UNIT/ML SOPN Inject 64-66 Units into the skin daily as needed (CBG >200).    Yes [provider]  metFORMIN (GLUCOPHAGE-XR) 500 MG 24 hr tablet Take 1,000 mg by mouth 2 (two) times daily.   Yes [provider]  metoprolol succinate (TOPROL-XL) 50 MG 24 hr tablet Take 50 mg by mouth daily. 09/20/17  Yes [provider]  naproxen sodium (ALEVE) 220 MG tablet Take 440 mg by mouth 2 (two) times daily as needed (pain/headache).   Yes [provider]  oxyCODONE-acetaminophen (PERCOCET/ROXICET) 5-325 MG tablet Take 1-2 tablets by mouth every 6 (six) hours as needed for moderate pain or severe pain. 10/16/16  Yes Ghimire, Henreitta Leber, MD  simvastatin (ZOCOR) 40 MG tablet Take 40 mg by mouth daily.   Yes [provider]  sitaGLIPtin (JANUVIA) 100 MG tablet Take 100 mg by mouth daily.   Yes [provider]  thiamine 100 MG tablet Take  1 tablet (100 mg total) by mouth daily. 09/16/16  Yes Oswald Hillock, MD  HYDROcodone-acetaminophen (NORCO) 5-325 MG tablet Take 1 tablet by mouth every 12 (twelve) hours as needed for moderate pain. Patient not taking: Reported on 10/10/2017 11/19/16   Dillingham, Loel Lofty, DO  HYDROcodone-acetaminophen (NORCO) 5-325 MG tablet Take 1 tablet by mouth every 6 (six) hours as needed for moderate pain. Patient not taking: Reported on 10/10/2017 01/13/17   Rayburn, Neta Mends, PA-C  insulin aspart (NOVOLOG) 100 UNIT/ML injection Sliding scale insulin Less than 70 initiate hypoglycemia protocol 70-120  0 units 120-150 1 unit 151-200 2 units 201-250 3 units 251-300 5 units 301-350 7 units 351-400 9 units  Greater than 400 call MD Patient not taking: Reported on 11/19/2016 09/15/16   Oswald Hillock, MD  insulin glargine (LANTUS) 100 UNIT/ML injection Inject 0.16 mLs (16 Units total) into the skin at bedtime. Patient not taking: Reported on 11/19/2016 10/16/16   Jonetta Osgood, MD    metoprolol succinate (TOPROL-XL) 100 MG 24 hr tablet Take 1 tablet (100 mg total) by mouth daily. Take with or immediately following a meal. Patient not taking: Reported on 10/10/2017 09/16/16   Oswald Hillock, MD    Physical Exam: BP (!) 131/59   Pulse 79   Temp 99.5 F (37.5 C) (Oral)   Resp 19   SpO2 91%   General: NAD, oriented, malnourished, ill-appearing Eyes: R eye blindness ENT: Normal  Neck: Normal range of motion Cardiovascular: S1, S2 present  Respiratory: CTA  Abdomen: Soft, NT, ND, BS+ Skin: Normal except for RLE  Musculoskeletal: RLE noted with foul smelling/wet gangrene/eschar although no superficial drainage noted on the ant lower leg to dorsum of foot. Surrounding erythema noted.   Psychiatric: Normal Neurologic: No focal neurologic deficit          Labs on Admission:  Basic Metabolic Panel: Recent Labs  Lab 10/10/17 1451  NA 129*  K 4.5  CL 92*  CO2 24  GLUCOSE 295*  BUN 14  CREATININE 0.79  CALCIUM 8.8*   Liver Function Tests: Recent Labs  Lab 10/10/17 1451  AST 50*  ALT 27  ALKPHOS 178*  BILITOT 0.5  PROT 6.6  ALBUMIN 2.4*   No results for input(s): LIPASE, AMYLASE in the last 168 hours. No results for input(s): AMMONIA in the last 168 hours. CBC: Recent Labs  Lab 10/10/17 1451  WBC 34.6*  NEUTROABS 30.8*  HGB 11.3*  HCT 33.6*  MCV 83.2  PLT 670*   Cardiac Enzymes: No results for input(s): CKTOTAL, CKMB, CKMBINDEX, TROPONINI in the last 168 hours.  BNP (last 3 results) No results for input(s): BNP in the last 8760 hours.  ProBNP (last 3 results) No results for input(s): PROBNP in the last 8760 hours.  CBG: No results for input(s): GLUCAP in the last 168 hours.  Radiological Exams on Admission: Dg Tibia/fibula Right  Result Date: 10/10/2017 CLINICAL DATA:  Diabetic lower extremity infection EXAM: RIGHT FOOT COMPLETE - 3+ VIEW; RIGHT TIBIA AND FIBULA - 2 VIEW COMPARISON:  None. FINDINGS: There is an ulcerative wound at  the right lateral malleolus soft tissues. There is extensive soft tissue emphysema beginning at the level of the right ankle and extending superiorly along the medial and lateral soft tissues of the right leg to the level of the mid tibia. There is no fracture or dislocation. No osteolysis. Bones are generally osteopenic. IMPRESSION: 1. Ulcerative lesion at the right lateral malleolus with soft tissue emphysema  tracking superiorly within the anterior and lateral leg to the level of the mid tibia. Findings are consistent with gangrenous infection. In the appropriate context, the findings would be compatible with a clinical diagnosis of necrotizing fasciitis. 2. No radiographic evidence of active infectious osteitis. Electronically Signed   By: Ulyses Jarred M.D.   On: 10/10/2017 17:20   Dg Foot Complete Right  Result Date: 10/10/2017 CLINICAL DATA:  Diabetic lower extremity infection EXAM: RIGHT FOOT COMPLETE - 3+ VIEW; RIGHT TIBIA AND FIBULA - 2 VIEW COMPARISON:  None. FINDINGS: There is an ulcerative wound at the right lateral malleolus soft tissues. There is extensive soft tissue emphysema beginning at the level of the right ankle and extending superiorly along the medial and lateral soft tissues of the right leg to the level of the mid tibia. There is no fracture or dislocation. No osteolysis. Bones are generally osteopenic. IMPRESSION: 1. Ulcerative lesion at the right lateral malleolus with soft tissue emphysema tracking superiorly within the anterior and lateral leg to the level of the mid tibia. Findings are consistent with gangrenous infection. In the appropriate context, the findings would be compatible with a clinical diagnosis of necrotizing fasciitis. 2. No radiographic evidence of active infectious osteitis. Electronically Signed   By: Ulyses Jarred M.D.   On: 10/10/2017 17:20   EKG: Pending  Assessment/Plan Present on Admission: . Cellulitis . Hyperlipidemia . Tobacco abuse disorder .  Essential hypertension . Legal blindness . Diabetic autonomic neuropathy associated with type 2 diabetes mellitus (HCC)  Principal Problem:   Cellulitis Active Problems:   Hyperlipidemia   Tobacco abuse disorder   Essential hypertension   Diabetic autonomic neuropathy associated with type 2 diabetes mellitus (HCC)   Legal blindness  Sepsis due to right leg cellulitis complicated by wet gangrene/eschar Elevated WBC 30s, elevated LA on admission Currently afebrile Trend LA, procalcitonin pending ESR, CRP pending X ray of RLE: Findings are consistent with gangrenous infection. Findings would be compatible with a clinical diagnosis of necrotizing fasciitis Vascular for ABI: pending IVF Started IV Vanc + IV Cefepime + IV metronidazole Consulted orthopedics- apprec rec Will consult plastic surgery in am, since they have treated his wound in the past Monitor closely, telemetry  Type 2 DM Last A1c in 11/2016 was 6.6, repeat pending SSI, lantus Held home regimen Continue gabapentin  HTN Stable, held home BP meds: amlodipine, BB  HLD Lipid panel pending Continue zocor  Tobacco abuse Advised to quit Nicotine patch ordered   DVT prophylaxis: SCDs  Code Status: Full  Family Communication: Discussed with daughter  Disposition Plan: TBD   Consults called: Orthopedics  Admission status: Inpatient    Alma Friendly MD [facility]   If 7PM-7AM, please contact night-coverage [email or web address] Password [facility]  10/10/2017, 6:29 PM

## 2017-10-11 DIAGNOSIS — I70261 Atherosclerosis of native arteries of extremities with gangrene, right leg: Secondary | ICD-10-CM

## 2017-10-11 DIAGNOSIS — A419 Sepsis, unspecified organism: Principal | ICD-10-CM

## 2017-10-11 DIAGNOSIS — Z794 Long term (current) use of insulin: Secondary | ICD-10-CM

## 2017-10-11 DIAGNOSIS — E1152 Type 2 diabetes mellitus with diabetic peripheral angiopathy with gangrene: Secondary | ICD-10-CM

## 2017-10-11 LAB — BASIC METABOLIC PANEL
Anion gap: 11 (ref 5–15)
BUN: 12 mg/dL (ref 6–20)
CALCIUM: 8.2 mg/dL — AB (ref 8.9–10.3)
CO2: 23 mmol/L (ref 22–32)
CREATININE: 0.6 mg/dL — AB (ref 0.61–1.24)
Chloride: 99 mmol/L — ABNORMAL LOW (ref 101–111)
GFR calc Af Amer: >60 mL/min (ref >60)
Glucose, Bld: 137 mg/dL — ABNORMAL HIGH (ref 65–99)
Potassium: 3.7 mmol/L (ref 3.5–5.1)
SODIUM: 133 mmol/L — AB (ref 135–145)

## 2017-10-11 LAB — LIPID PANEL
Cholesterol: 72 mg/dL (ref 0–200)
HDL: 18 mg/dL — ABNORMAL LOW (ref >40)
LDL CALC: 38 mg/dL (ref 0–99)
Total CHOL/HDL Ratio: 4 RATIO
Triglycerides: 78 mg/dL (ref <150)
VLDL: 16 mg/dL (ref 0–40)

## 2017-10-11 LAB — GLUCOSE, CAPILLARY
GLUCOSE-CAPILLARY: 141 mg/dL — AB (ref 65–99)
GLUCOSE-CAPILLARY: 245 mg/dL — AB (ref 65–99)
Glucose-Capillary: 151 mg/dL — ABNORMAL HIGH (ref 65–99)
Glucose-Capillary: 163 mg/dL — ABNORMAL HIGH (ref 65–99)

## 2017-10-11 LAB — CBC WITH DIFFERENTIAL/PLATELET
Basophils Absolute: 0 10*3/uL (ref 0.0–0.1)
Basophils Relative: 0 %
Eosinophils Absolute: 0 10*3/uL (ref 0.0–0.7)
Eosinophils Relative: 0 %
HCT: 29.1 % — ABNORMAL LOW (ref 39.0–52.0)
Hemoglobin: 9.6 g/dL — ABNORMAL LOW (ref 13.0–17.0)
LYMPHS ABS: 2.1 10*3/uL (ref 0.7–4.0)
Lymphocytes Relative: 8 %
MCH: 28 pg (ref 26.0–34.0)
MCHC: 33 g/dL (ref 30.0–36.0)
MCV: 84.8 fL (ref 78.0–100.0)
Monocytes Absolute: 1.8 10*3/uL — ABNORMAL HIGH (ref 0.1–1.0)
Monocytes Relative: 7 %
Neutro Abs: 21.9 10*3/uL — ABNORMAL HIGH (ref 1.7–7.7)
Neutrophils Relative %: 85 %
Platelets: 600 10*3/uL — ABNORMAL HIGH (ref 150–400)
RBC: 3.43 MIL/uL — AB (ref 4.22–5.81)
RDW: 15.7 % — AB (ref 11.5–15.5)
WBC: 25.8 10*3/uL — AB (ref 4.0–10.5)

## 2017-10-11 LAB — PROCALCITONIN: Procalcitonin: 0.16 ng/mL

## 2017-10-11 LAB — PREALBUMIN: Prealbumin: <5 mg/dL — ABNORMAL LOW (ref 18–38)

## 2017-10-11 LAB — HIV ANTIBODY (ROUTINE TESTING W REFLEX): HIV Screen 4th Generation wRfx: NONREACTIVE

## 2017-10-11 MED ORDER — SODIUM CHLORIDE 0.9 % IV SOLN
2.0000 g | Freq: Two times a day (BID) | INTRAVENOUS | Status: DC
  Administered 2017-10-11 – 2017-10-18 (×14): 2 g via INTRAVENOUS
  Filled 2017-10-11 (×14): qty 2

## 2017-10-11 MED ORDER — GLUCERNA SHAKE PO LIQD
237.0000 mL | Freq: Two times a day (BID) | ORAL | Status: DC
  Administered 2017-10-11 – 2017-10-18 (×11): 237 mL via ORAL
  Filled 2017-10-11 (×17): qty 237

## 2017-10-11 MED ORDER — ADULT MULTIVITAMIN W/MINERALS CH
1.0000 | ORAL_TABLET | Freq: Every day | ORAL | Status: DC
  Administered 2017-10-11 – 2017-10-21 (×10): 1 via ORAL
  Filled 2017-10-11 (×10): qty 1

## 2017-10-11 NOTE — Care Management Note (Addendum)
Case Management Note  Patient Details  Name: SHAHEEN MENDE MRN: [id number] Date of Birth: 09/16/1945  Subjective/Objective:  Admitted for Cellulitis  Action/Plan: In to speak with patient. Prior admission patient lived at home with son.  Son or daughter takes him to medical appointments.   Has to reach out to neighbor from time to time.  Denies inability to afford medications or food.  States son does most of the cooking.  Daughter "Jeanett Schlein" comes over every Sunday and she does shopping for patient. No scale at home. Has used  Home DME: rolling walker-4 wheels, bedside commode. PCP: Christain Sacramento, MD. Uses Pharmacy in [place].  States he has used [facility] in the past and is ok with using them again if needed.  Expected Discharge Plan:  Home/Self Care  Discharge planning Services  CM Consult  Status of Service:  In process, will continue to follow  If discussed at Long Length of Stay Meetings, dates discussed:    Additional Comments:  Kristen Cardinal, RN  Nurse case [place] 10/11/2017, 3:35 PM

## 2017-10-11 NOTE — Progress Notes (Signed)
In to speak with patient, but patient eating lunch.  Introduced myself and advised will attempt to comeback.  Patient verbalized understanding.  Kristen Cardinal, BSN, RN Nurse case [facility]

## 2017-10-11 NOTE — Plan of Care (Signed)
Patient NPO, sleeping.

## 2017-10-11 NOTE — Progress Notes (Signed)
PROGRESS NOTE  Andrew [name] PPJ:[id number] DOB: [birth year] DOA: 10/10/2017 PCP: Christain Sacramento, MD  HPI/Recap of past 24 hours: Andrew [name] is a 72 y.o. male with medical history significant for Type 2 DM, HTN, HLD, R eye blindness, tobacco abuse presents to the ED c/o worsening RLE cellulitis. Pt reported he sustained a wound to the foot about 3 weeks ago, that gradually spread to his lower leg. Pt reported his PCP prescribed PO keflex for 10 days of which he was compliant to, but didn't help with the infection. Pt denies any fever/chills, nausea/vomiting, chest pain, SOB, abdominal pain. Of note, pt has had recurrent cellulitis with very poor wound healing. Last R arm infection, required a skin graft. In the ED, noted was a very foul smelling/wet gangrene/eschar on the ant lower leg to dorsum of foot. Surrounding erythema noted. Pt was noted to be septic and admitted for further management.  Today, pt reported R leg pain, denies any chest pain, fever/chills, SOB, abdominal pain, N/V/D/C.   Assessment/Plan: Principal Problem:   Cellulitis Active Problems:   Hyperlipidemia   Tobacco abuse disorder   Essential hypertension   Diabetic autonomic neuropathy associated with type 2 diabetes mellitus (HCC)   Legal blindness  Sepsis due to right leg cellulitis complicated by gangrene/eschar/necrosis Elevated WBC 30s, elevated LA on admission Currently afebrile, resolving leukocytosis LA trended down, procalcitonin 0.16 ESR 92,  CRP 13.5, pre-albumin <5 X ray of RLE: Findings are consistent with gangrenous infection. Findings would be compatible with a clinical diagnosis of necrotizing fasciitis Vascular for ABI: pending IVF Continue IV Vanc + IV Cefepime + IV metronidazole  Consulted orthopedics- Rec amputation, pt currently refusing Plastic surgery consulted, rec amputation, not a good candidate for Acell/VAC treatment WOC on board Monitor closely, telemetry  Type 2 DM Last A1c  in 11/2016 was 6.6, repeat pending SSI, lantus Held home regimen Continue gabapentin  HTN Stable, held home BP meds: amlodipine, BB  HLD LDL 18 Continue zocor  Tobacco abuse Advised to quit Nicotine patch ordered     Code Status: Full   Family Communication: None at bedside  Disposition Plan: TBD   Consultants:  Orthopedics  Plastic surgery  Procedures:  None  Antimicrobials:  IV Vanc  IV Cefepime  IV Metronidazole  DVT prophylaxis:  SCD, pending possible amputation   Objective: Vitals:   10/10/17 2014 10/11/17 0021 10/11/17 0639 10/11/17 0749  BP:   (!) 132/59 (!) 128/58  Pulse:   75 74  Resp:   17 18  Temp:   97.7 F (36.5 C) 98 F (36.7 C)  TempSrc:   Oral Oral  SpO2:  95% 94% 95%  Weight: 72.6 kg (160 lb 0.9 oz)     Height:        Intake/Output Summary (Last 24 hours) at 10/11/2017 1435 Last data filed at 10/11/2017 1357 Gross per 24 hour  Intake 1315.25 ml  Output 325 ml  Net 990.25 ml   Filed Weights   10/10/17 2014  Weight: 72.6 kg (160 lb 0.9 oz)    Exam:   General: NAD, ill-looking   Cardiovascular: S1, S2 present   Respiratory: CTA  Abdomen: Soft, NT, ND, BS+  Musculoskeletal: RLE noted with foul smelling/gangrene/eschar/necrotic, ant lower leg to dorsum of foot. Surrounding erythema noted    Skin: Normal except for RLE  Psychiatry: Normal mood    Data Reviewed: CBC: Recent Labs  Lab 10/10/17 1451 10/11/17 0538  WBC 34.6* 25.8*  NEUTROABS 30.8* 21.9*  HGB 11.3* 9.6*  HCT 33.6* 29.1*  MCV 83.2 84.8  PLT 670* 259*   Basic Metabolic Panel: Recent Labs  Lab 10/10/17 1451 10/11/17 0538  NA 129* 133*  K 4.5 3.7  CL 92* 99*  CO2 24 23  GLUCOSE 295* 137*  BUN 14 12  CREATININE 0.79 0.60*  CALCIUM 8.8* 8.2*   GFR: Estimated Creatinine Clearance: 78 mL/min (A) (by C-G formula based on SCr of 0.6 mg/dL (L)). Liver Function Tests: Recent Labs  Lab 10/10/17 1451  AST 50*  ALT 27  ALKPHOS 178*    BILITOT 0.5  PROT 6.6  ALBUMIN 2.4*   No results for input(s): LIPASE, AMYLASE in the last 168 hours. No results for input(s): AMMONIA in the last 168 hours. Coagulation Profile: No results for input(s): INR, PROTIME in the last 168 hours. Cardiac Enzymes: No results for input(s): CKTOTAL, CKMB, CKMBINDEX, TROPONINI in the last 168 hours. BNP (last 3 results) No results for input(s): PROBNP in the last 8760 hours. HbA1C: No results for input(s): HGBA1C in the last 72 hours. CBG: Recent Labs  Lab 10/10/17 2019 10/11/17 0729 10/11/17 1140  GLUCAP 308* 141* 151*   Lipid Profile: Recent Labs    10/11/17 0538  CHOL 72  HDL 18*  LDLCALC 38  TRIG 78  CHOLHDL 4.0   Thyroid Function Tests: No results for input(s): TSH, T4TOTAL, FREET4, T3FREE, THYROIDAB in the last 72 hours. Anemia Panel: No results for input(s): VITAMINB12, FOLATE, FERRITIN, TIBC, IRON, RETICCTPCT in the last 72 hours. Urine analysis:    Component Value Date/Time   COLORURINE YELLOW 08/30/2016 1926   APPEARANCEUR HAZY (A) 08/30/2016 1926   LABSPEC 1.020 08/30/2016 1926   PHURINE 5.0 08/30/2016 1926   GLUCOSEU >=500 (A) 08/30/2016 1926   HGBUR MODERATE (A) 08/30/2016 1926   BILIRUBINUR NEGATIVE 08/30/2016 1926   KETONESUR 20 (A) 08/30/2016 1926   PROTEINUR 100 (A) 08/30/2016 1926   UROBILINOGEN 0.2 04/18/2015 0210   NITRITE NEGATIVE 08/30/2016 1926   LEUKOCYTESUR NEGATIVE 08/30/2016 1926   Sepsis Labs: @LABRCNTIP (procalcitonin:4,lacticidven:4)  ) Recent Results (from the past 240 hour(s))  MRSA PCR Screening     Status: Abnormal   Collection Time: 10/10/17  8:32 PM  Result Value Ref Range Status   MRSA by PCR POSITIVE (A) NEGATIVE Final    Comment:        The GeneXpert MRSA Assay (FDA approved for NASAL specimens only), is one component of a comprehensive MRSA colonization surveillance program. It is not intended to diagnose MRSA infection nor to guide or monitor treatment for MRSA  infections. RESULT CALLED TO, READ BACK BY AND VERIFIED WITH: MITZ,A RN 10/10/17 AT 2227 SKEEN,P       Studies: Dg Tibia/fibula Right  Result Date: 10/10/2017 CLINICAL DATA:  Diabetic lower extremity infection EXAM: RIGHT FOOT COMPLETE - 3+ VIEW; RIGHT TIBIA AND FIBULA - 2 VIEW COMPARISON:  None. FINDINGS: There is an ulcerative wound at the right lateral malleolus soft tissues. There is extensive soft tissue emphysema beginning at the level of the right ankle and extending superiorly along the medial and lateral soft tissues of the right leg to the level of the mid tibia. There is no fracture or dislocation. No osteolysis. Bones are generally osteopenic. IMPRESSION: 1. Ulcerative lesion at the right lateral malleolus with soft tissue emphysema tracking superiorly within the anterior and lateral leg to the level of the mid tibia. Findings are consistent with gangrenous infection. In the appropriate context, the findings would be compatible with  a clinical diagnosis of necrotizing fasciitis. 2. No radiographic evidence of active infectious osteitis. Electronically Signed   By: Ulyses Jarred M.D.   On: 10/10/2017 17:20   Dg Foot Complete Right  Result Date: 10/10/2017 CLINICAL DATA:  Diabetic lower extremity infection EXAM: RIGHT FOOT COMPLETE - 3+ VIEW; RIGHT TIBIA AND FIBULA - 2 VIEW COMPARISON:  None. FINDINGS: There is an ulcerative wound at the right lateral malleolus soft tissues. There is extensive soft tissue emphysema beginning at the level of the right ankle and extending superiorly along the medial and lateral soft tissues of the right leg to the level of the mid tibia. There is no fracture or dislocation. No osteolysis. Bones are generally osteopenic. IMPRESSION: 1. Ulcerative lesion at the right lateral malleolus with soft tissue emphysema tracking superiorly within the anterior and lateral leg to the level of the mid tibia. Findings are consistent with gangrenous infection. In the  appropriate context, the findings would be compatible with a clinical diagnosis of necrotizing fasciitis. 2. No radiographic evidence of active infectious osteitis. Electronically Signed   By: Ulyses Jarred M.D.   On: 10/10/2017 17:20    Scheduled Meds: . aspirin EC  325 mg Oral Daily  . feeding supplement (ENSURE ENLIVE)  237 mL Oral Q24H  . gabapentin  100 mg Oral TID  . insulin aspart  0-5 Units Subcutaneous QHS  . insulin aspart  0-9 Units Subcutaneous TID WC  . insulin glargine  15 Units Subcutaneous Daily  . nicotine  21 mg Transdermal QHS  . simvastatin  40 mg Oral Daily  . sodium chloride flush  3 mL Intravenous Q12H  . thiamine  100 mg Oral Daily    Continuous Infusions: . sodium chloride 100 mL/hr at 10/10/17 2130  . ceFEPime (MAXIPIME) IV    . metronidazole 500 mg (10/11/17 1251)  . vancomycin Stopped (10/11/17 1154)     LOS: 1 day     [place] Friendly, MD [facility]   If 7PM-7AM, please contact night-coverage [email or web address] Password [facility] 10/11/2017, 2:35 PM

## 2017-10-11 NOTE — Consult Note (Signed)
Reason for Consult: Right lower leg cellulitis/gangrene Referring Physician: Dr. Kendal Hymen, Hospitalist Service Inpatient consult [facility] Date of Consult:  10/11/2017  Andrew [name] is an 72 y.o. male.  HPI: Andrew [name] is a 72 yo male known to plastics service following treatment of a right elbow/upper arm wound which eventually healed following graft and wound care last year, who now presents with severe cellulitis/gangrene of the right lower leg. He reports the wound started several weeks ago and has gradually gotten worse despite antibiotics. WBC's to 34.6, lactic acid 2.11. Xrays of the right lower leg showed ulceration at the right lateral malleolus with soft tissue emphysema tracing superiorly within the anterior and lateral leg to the level of the mid tibia consistent with gangrenous infection, necrotizing fasciitis. Currently on IV Vanc, IV Cefepime, IV metronidazole.  We were asked to evaluate for possible treatment for this new wound to the right lower leg.   He has past medical history also significant for T2DM with neuropathy, HTN, Hyperlipidemia and chronic bronchitis. He continues to smoke cigarettes and was unwilling to quit despite his difficulty healing the right arm wound in the past.   Past Medical History:  Diagnosis Date  . Blind right eye    S/P shotgun  . Bursitis    right Olecranon  . Chronic bronchitis ([place])   . Diabetic neuropathy ([place])   . Hyperlipemia   . Hypertension   . 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 Type II diabetes mellitus ([place])   . Wound cellulitis    right elbow open    Past Surgical History:  Procedure Laterality Date  . APPLICATION OF A-CELL OF EXTREMITY Right 10/15/2016   Procedure: APPLICATION OF A-CELL OF EXTREMITY;  Surgeon: Loel Lofty Dillingham, DO;  Location: [place];  Service: Plastics;  Laterality: Right;  . APPLICATION OF A-CELL OF EXTREMITY Right 11/19/2016   Procedure: APPLICATION OF  A-CELL OF EXTREMITY;  Surgeon: Loel Lofty Dillingham, DO;  Location: [place];  Service: Plastics;  Laterality: Right;  . APPLICATION OF A-CELL OF EXTREMITY Right 01/13/2017   Procedure: APPLICATION OF A-CELL OF EXTREMITY;  Surgeon: Wallace Going, DO;  Location: [place];  Service: Plastics;  Laterality: Right;  . APPLICATION OF WOUND VAC Right 10/09/2016   Procedure: APPLICATION OF WOUND VAC;  Surgeon: Leandrew Koyanagi, MD;  Location: [place];  Service: Orthopedics;  Laterality: Right;  . APPLICATION OF WOUND VAC Right 10/15/2016   Procedure: WOUND VAC CHANGE;  Surgeon: Loel Lofty Dillingham, DO;  Location: [place];  Service: Plastics;  Laterality: Right;  . APPLICATION OF WOUND VAC Right 11/19/2016   Procedure: APPLICATION OF WOUND VAC;  Surgeon: Loel Lofty Dillingham, DO;  Location: [place];  Service: Plastics;  Laterality: Right;  . GRAFT APPLICATION Right 4/00/8676   Procedure: GRAFT APPLICATION  RIGHT UPPER ARM FROM DONOR SITE;  Surgeon: Wallace Going, DO;  Location: [place];  Service: Plastics;  Laterality: Right;  . I&D EXTREMITY Right 10/09/2016   Procedure: IRRIGATION AND DEBRIDEMENT EXTREMITY RIGHT ARM WOUND;  Surgeon: Leandrew Koyanagi, MD;  Location: [place];  Service: Orthopedics;  Laterality: Right;  . I&D EXTREMITY N/A 10/11/2016   Procedure: IRRIGATION AND DEBRIDEMENT EXTREMITY RIGHT ARM WOUND;  Surgeon: Leandrew Koyanagi, MD;  Location: [place];  Service: Orthopedics;  Laterality: N/A;  . I&D EXTREMITY Right 10/15/2016   Procedure: IRRIGATION AND DEBRIDEMENT EXTREMITY/RIGHT ELBOW;  Surgeon: Loel Lofty Dillingham, DO;  Location: [place];  Service:  Plastics;  Laterality: Right;  . INCISION AND DRAINAGE OF WOUND Right 10/09/2016   arm  . INCISION AND DRAINAGE OF WOUND Right 11/19/2016   Procedure: IRRIGATION AND DEBRIDEMENT WOUND;  Surgeon: Loel Lofty Dillingham, DO;  Location: [place];  Service: Plastics;  Laterality: Right;  . SKIN SPLIT GRAFT Right 01/13/2017   Procedure: SKIN GRAFT SPLIT THICKNESS;  Surgeon: Wallace Going, DO;  Location: [place];  Service: Plastics;  Laterality: Right;    Family History  Problem Relation Age of Onset  . Diabetes Mellitus II Mother   . Diabetes Mellitus II Father   . Diabetes Mellitus II Sister   . Diabetes Mellitus II Sister     Social History:  reports that he has been smoking cigarettes.  He has a 94.50 pack-year smoking history. He has quit using smokeless tobacco. His smokeless tobacco use included chew. He reports that he drinks alcohol. He reports that he does not use drugs.  Allergies:  Allergies  Allergen Reactions  . Penicillins Rash    Tolerated Zosyn Jan 2018  Possibly rash? Has patient had a PCN reaction causing immediate rash, facial/tongue/throat swelling, SOB or lightheadedness with hypotension: YES Has patient had a PCN reaction causing severe rash involving mucus membranes or skin necrosis:NO Has patient had a PCN reaction that required hospitalization NO Has patient had a PCN reaction occurring within the last 10 years:NO If all of the above answers are "NO", then may proceed with Cephal    Medications: I have reviewed the patient's current medications.  Results for orders placed or performed during the hospital encounter of 10/10/17 (from the past 48 hour(s))  Comprehensive metabolic panel     Status: Abnormal   Collection Time: 10/10/17  2:51 PM  Result Value Ref Range   Sodium 129 (L) 135 - 145 mmol/L   Potassium 4.5 3.5 - 5.1 mmol/L   Chloride 92 (L) 101 - 111 mmol/L   CO2 24 22 - 32 mmol/L   Glucose, Bld 295 (H) 65 - 99 mg/dL   BUN 14 6 - 20 mg/dL   Creatinine, Ser 0.79 0.61 - 1.24 mg/dL   Calcium 8.8 (L) 8.9 - 10.3 mg/dL   Total Protein 6.6 6.5 - 8.1 g/dL   Albumin 2.4 (L) 3.5 - 5.0 g/dL   AST 50 (H) 15 - 41 U/L   ALT 27 17 - 63 U/L   Alkaline Phosphatase 178 (H) 38 - 126 U/L   Total Bilirubin 0.5 0.3 - 1.2 mg/dL   GFR calc non Af Amer >60 >60 mL/min   GFR calc Af Amer >60 >60 mL/min    Comment: (NOTE) The eGFR has been  calculated using the CKD EPI equation. This calculation has not been validated in all clinical situations. eGFR's persistently <60 mL/min signify possible Chronic Kidney Disease.    Anion gap 13 5 - 15    Comment: Performed at [facility] [address], [place],  [address]  CBC with Differential     Status: Abnormal   Collection Time: 10/10/17  2:51 PM  Result Value Ref Range   WBC 34.6 (H) 4.0 - 10.5 K/uL   RBC 4.04 (L) 4.22 - 5.81 MIL/uL   Hemoglobin 11.3 (L) 13.0 - 17.0 g/dL   HCT 33.6 (L) 39.0 - 52.0 %   MCV 83.2 78.0 - 100.0 fL   MCH 28.0 26.0 - 34.0 pg   MCHC 33.6 30.0 - 36.0 g/dL   RDW 15.2 11.5 - 15.5 %  Platelets 670 (H) 150 - 400 K/uL   Neutrophils Relative % 89 %   Lymphocytes Relative 4 %   Monocytes Relative 7 %   Eosinophils Relative 0 %   Basophils Relative 0 %   Neutro Abs 30.8 (H) 1.7 - 7.7 K/uL   Lymphs Abs 1.4 0.7 - 4.0 K/uL   Monocytes Absolute 2.4 (H) 0.1 - 1.0 K/uL   Eosinophils Absolute 0.0 0.0 - 0.7 K/uL   Basophils Absolute 0.0 0.0 - 0.1 K/uL   RBC Morphology POLYCHROMASIA PRESENT    WBC Morphology MILD LEFT SHIFT (1-5% METAS, OCC MYELO, OCC BANDS)     Comment: Performed at [facility] [address] [place], [place] [address]  I-Stat CG4 Lactic Acid, ED     Status: Abnormal   Collection Time: 10/10/17  3:17 PM  Result Value Ref Range   Lactic Acid, Venous 2.11 (HH) 0.5 - 1.9 mmol/L   Comment NOTIFIED PHYSICIAN   I-Stat CG4 Lactic Acid, ED     Status: Abnormal   Collection Time: 10/10/17  4:46 PM  Result Value Ref Range   Lactic Acid, Venous 2.05 (HH) 0.5 - 1.9 mmol/L   Comment NOTIFIED PHYSICIAN   C-reactive protein     Status: Abnormal   Collection Time: 10/10/17  7:24 PM  Result Value Ref Range   CRP 13.5 (H) <1.0 mg/dL    Comment: Performed at [facility], [place] [address] [place], [place] [address]  Sedimentation rate     Status: Abnormal   Collection Time: 10/10/17  7:24 PM  Result Value Ref Range   Sed  Rate 92 (H) 0 - 16 mm/hr    Comment: Performed at [facility] [address], [place], [place] [address]  Lactic acid, plasma     Status: None   Collection Time: 10/10/17  7:24 PM  Result Value Ref Range   Lactic Acid, Venous 1.3 0.5 - 1.9 mmol/L    Comment: Performed at [facility] [address] [place],  [address]  Procalcitonin - Baseline     Status: None   Collection Time: 10/10/17  7:24 PM  Result Value Ref Range   Procalcitonin 0.16 ng/mL    Comment:        Interpretation: PCT (Procalcitonin) <= 0.5 ng/mL: Systemic infection (sepsis) is not likely. Local bacterial infection is possible. (NOTE)       Sepsis PCT Algorithm           Lower Respiratory Tract                                      Infection PCT Algorithm    ----------------------------     ----------------------------         PCT < 0.25 ng/mL                PCT < 0.10 ng/mL         Strongly encourage             Strongly discourage   discontinuation of antibiotics    initiation of antibiotics    ----------------------------     -----------------------------       PCT 0.25 - 0.50 ng/mL            PCT 0.10 - 0.25 ng/mL               OR       >  80% decrease in PCT            Discourage initiation of                                            antibiotics      Encourage discontinuation           of antibiotics    ----------------------------     -----------------------------         PCT >= 0.50 ng/mL              PCT 0.26 - 0.50 ng/mL               AND        <80% decrease in PCT             Encourage initiation of                                             antibiotics       Encourage continuation           of antibiotics    ----------------------------     -----------------------------        PCT >= 0.50 ng/mL                  PCT > 0.50 ng/mL               AND         increase in PCT                  Strongly encourage                                      initiation of antibiotics     Strongly encourage escalation           of antibiotics                                     -----------------------------                                           PCT <= 0.25 ng/mL                                                 OR                                        > 80% decrease in PCT                                     Discontinue / Do not initiate  antibiotics Performed at [facility], [place] [address] [place], [place] [address]   Glucose, capillary     Status: Abnormal   Collection Time: 10/10/17  8:19 PM  Result Value Ref Range   Glucose-Capillary 308 (H) 65 - 99 mg/dL  MRSA PCR Screening     Status: Abnormal   Collection Time: 10/10/17  8:32 PM  Result Value Ref Range   MRSA by PCR POSITIVE (A) NEGATIVE    Comment:        The GeneXpert MRSA Assay (FDA approved for NASAL specimens only), is one component of a comprehensive MRSA colonization surveillance program. It is not intended to diagnose MRSA infection nor to guide or monitor treatment for MRSA infections. RESULT CALLED TO, READ BACK BY AND VERIFIED WITH: MITZ,A RN 10/10/17 AT 2227 SKEEN,P   Basic metabolic panel     Status: Abnormal   Collection Time: 10/11/17  5:38 AM  Result Value Ref Range   Sodium 133 (L) 135 - 145 mmol/L   Potassium 3.7 3.5 - 5.1 mmol/L   Chloride 99 (L) 101 - 111 mmol/L   CO2 23 22 - 32 mmol/L   Glucose, Bld 137 (H) 65 - 99 mg/dL   BUN 12 6 - 20 mg/dL   Creatinine, Ser 0.60 (L) 0.61 - 1.24 mg/dL   Calcium 8.2 (L) 8.9 - 10.3 mg/dL   GFR calc non Af Amer >60 >60 mL/min   GFR calc Af Amer >60 >60 mL/min    Comment: (NOTE) The eGFR has been calculated using the CKD EPI equation. This calculation has not been validated in all clinical situations. eGFR's persistently <60 mL/min signify possible Chronic Kidney Disease.    Anion gap 11 5 - 15    Comment: Performed at [facility] [address], [place], [place] [address]  CBC  with Differential     Status: Abnormal   Collection Time: 10/11/17  5:38 AM  Result Value Ref Range   WBC 25.8 (H) 4.0 - 10.5 K/uL   RBC 3.43 (L) 4.22 - 5.81 MIL/uL   Hemoglobin 9.6 (L) 13.0 - 17.0 g/dL   HCT 29.1 (L) 39.0 - 52.0 %   MCV 84.8 78.0 - 100.0 fL   MCH 28.0 26.0 - 34.0 pg   MCHC 33.0 30.0 - 36.0 g/dL   RDW 15.7 (H) 11.5 - 15.5 %   Platelets 600 (H) 150 - 400 K/uL   Neutrophils Relative % 85 %   Lymphocytes Relative 8 %   Monocytes Relative 7 %   Eosinophils Relative 0 %   Basophils Relative 0 %   Neutro Abs 21.9 (H) 1.7 - 7.7 K/uL   Lymphs Abs 2.1 0.7 - 4.0 K/uL   Monocytes Absolute 1.8 (H) 0.1 - 1.0 K/uL   Eosinophils Absolute 0.0 0.0 - 0.7 K/uL   Basophils Absolute 0.0 0.0 - 0.1 K/uL   Smear Review MORPHOLOGY UNREMARKABLE     Comment: Performed at [facility], [place] [address] [place], [place] [address]  Prealbumin     Status: Abnormal   Collection Time: 10/11/17  5:38 AM  Result Value Ref Range   Prealbumin <5 (L) 18 - 38 mg/dL    Comment: Performed at [facility] [address], [place], [place] [address]  Procalcitonin     Status: None   Collection Time: 10/11/17  5:38 AM  Result Value Ref Range   Procalcitonin 0.16 ng/mL    Comment:  Interpretation: PCT (Procalcitonin) <= 0.5 ng/mL: Systemic infection (sepsis) is not likely. Local bacterial infection is possible. (NOTE)       Sepsis PCT Algorithm           Lower Respiratory Tract                                      Infection PCT Algorithm    ----------------------------     ----------------------------         PCT < 0.25 ng/mL                PCT < 0.10 ng/mL         Strongly encourage             Strongly discourage   discontinuation of antibiotics    initiation of antibiotics    ----------------------------     -----------------------------       PCT 0.25 - 0.50 ng/mL            PCT 0.10 - 0.25 ng/mL               OR       >80% decrease in PCT            Discourage initiation  of                                            antibiotics      Encourage discontinuation           of antibiotics    ----------------------------     -----------------------------         PCT >= 0.50 ng/mL              PCT 0.26 - 0.50 ng/mL               AND        <80% decrease in PCT             Encourage initiation of                                             antibiotics       Encourage continuation           of antibiotics    ----------------------------     -----------------------------        PCT >= 0.50 ng/mL                  PCT > 0.50 ng/mL               AND         increase in PCT                  Strongly encourage                                      initiation of antibiotics    Strongly encourage escalation           of antibiotics                                     -----------------------------  PCT <= 0.25 ng/mL                                                 OR                                        > 80% decrease in PCT                                     Discontinue / Do not initiate                                             antibiotics Performed at [facility], [place] [address] [place], [place] [address]   Lipid panel     Status: Abnormal   Collection Time: 10/11/17  5:38 AM  Result Value Ref Range   Cholesterol 72 0 - 200 mg/dL   Triglycerides 78 <150 mg/dL   HDL 18 (L) >40 mg/dL   Total CHOL/HDL Ratio 4.0 RATIO   VLDL 16 0 - 40 mg/dL   LDL Cholesterol 38 0 - 99 mg/dL    Comment:        Total Cholesterol/HDL:CHD Risk Coronary Heart Disease Risk Table                     Men   Women  1/2 Average Risk   3.4   3.3  Average Risk       5.0   4.4  2 X Average Risk   9.6   7.1  3 X Average Risk  23.4   11.0        Use the calculated Patient Ratio above and the CHD Risk Table to determine the patient's CHD Risk.        ATP III CLASSIFICATION (LDL):  <100     mg/dL   Optimal  100-129  mg/dL   Near or  Above                    Optimal  130-159  mg/dL   Borderline  160-189  mg/dL   High  >190     mg/dL   Very High Performed at [facility] [address], [place], [place] [address]   Glucose, capillary     Status: Abnormal   Collection Time: 10/11/17  7:29 AM  Result Value Ref Range   Glucose-Capillary 141 (H) 65 - 99 mg/dL  Glucose, capillary     Status: Abnormal   Collection Time: 10/11/17 11:40 AM  Result Value Ref Range   Glucose-Capillary 151 (H) 65 - 99 mg/dL    Dg Tibia/fibula Right  Result Date: 10/10/2017 CLINICAL DATA:  Diabetic lower extremity infection EXAM: RIGHT FOOT COMPLETE - 3+ VIEW; RIGHT TIBIA AND FIBULA - 2 VIEW COMPARISON:  None. FINDINGS: There is an ulcerative wound at the right lateral malleolus soft tissues. There is extensive soft tissue emphysema beginning at the level of the right ankle and extending superiorly along the medial and lateral soft tissues of the right  leg to the level of the mid tibia. There is no fracture or dislocation. No osteolysis. Bones are generally osteopenic. IMPRESSION: 1. Ulcerative lesion at the right lateral malleolus with soft tissue emphysema tracking superiorly within the anterior and lateral leg to the level of the mid tibia. Findings are consistent with gangrenous infection. In the appropriate context, the findings would be compatible with a clinical diagnosis of necrotizing fasciitis. 2. No radiographic evidence of active infectious osteitis. Electronically Signed   By: Ulyses Jarred M.D.   On: 10/10/2017 17:20   Dg Foot Complete Right  Result Date: 10/10/2017 CLINICAL DATA:  Diabetic lower extremity infection EXAM: RIGHT FOOT COMPLETE - 3+ VIEW; RIGHT TIBIA AND FIBULA - 2 VIEW COMPARISON:  None. FINDINGS: There is an ulcerative wound at the right lateral malleolus soft tissues. There is extensive soft tissue emphysema beginning at the level of the right ankle and extending superiorly along the medial and lateral soft tissues  of the right leg to the level of the mid tibia. There is no fracture or dislocation. No osteolysis. Bones are generally osteopenic. IMPRESSION: 1. Ulcerative lesion at the right lateral malleolus with soft tissue emphysema tracking superiorly within the anterior and lateral leg to the level of the mid tibia. Findings are consistent with gangrenous infection. In the appropriate context, the findings would be compatible with a clinical diagnosis of necrotizing fasciitis. 2. No radiographic evidence of active infectious osteitis. Electronically Signed   By: Ulyses Jarred M.D.   On: 10/10/2017 17:20    Review of Systems  Constitutional: Negative for chills and fever.  Musculoskeletal:       C/o pain in the right lower leg   Blood pressure (!) 128/58, pulse 74, temperature 98 F (36.7 C), temperature source Oral, resp. rate 18, height 5' 7"  (1.702 m), weight 72.6 kg (160 lb 0.9 oz), SpO2 95 %. Physical Exam  Constitutional:  Elderly chronically ill appearing male lying in bed complaining of right lower leg pain.   HENT:  Head: Normocephalic and atraumatic.  Nose: Nose normal.  Mouth/Throat: Oropharynx is clear and moist.  Eyes:  Blind right eye  Neck: No tracheal deviation present. No thyromegaly present.  Cardiovascular: Normal rate, regular rhythm and normal heart sounds.  Respiratory: Effort normal. No stridor. No respiratory distress.  Skin:  Right lower leg anteriorly and medially with extensive black necrotic tissue and foul odor with peri necrotic erythema and edema. Area is ~ 30 x 8 cm.      Assessment/Plan: Right lower leg gangrene- Patient is currently not a good candidate for Acell/VAC treatment with very severe infection and unlikely to improve as continues to smoke, prealbumin <5, ABI pending but suspect poor flow.  The patient is resistant to amputation, and Dr. Marla Roe reviewed the severity of the situation with the patient and the need to re-consider amputation. Will  contact Ortho to discuss further with the patient.   Marin Wisner,PA-C Plastic Surgery [phone]

## 2017-10-11 NOTE — Progress Notes (Signed)
Patient's neighbor, Linward Natal, called and wanted the doctor to be aware that patient's primary care physician Dr. Redmond Pulling is considering palliative care. Also stated that the patient does not have 24 hour care at home, because the patient's son leaves him home alone for days at a time.

## 2017-10-11 NOTE — Consult Note (Signed)
[facility] Nurse wound consult note Reason for Consult:Necrotic tissue to anterior foot and LE with eschar and odor consistent with necrosis/infectious process Wound type:Suspect gangrene or other infection Pressure Injury POA: NA Measurement:28cm x 5cm eschar Wound bed:see above Drainage (amount, consistency, odor) none Periwound:dry, injtact Dressing procedure/placement/frequency:I will provide Nursing with orders for dry dressings twice daily and elevation of the extremity for comfort. ]Plastics is expected to be in to see.  Ortho has seen and recommended amputation.  Family is considering Goals of Care.  [facility] nursing team will not follow, but will remain available to this patient, the nursing and medical teams.  Please re-consult if needed. Thanks, Maudie Flakes, MSN, RN, [place], Arther Abbott  Pager# [phone]

## 2017-10-11 NOTE — Progress Notes (Signed)
Initial Nutrition Assessment  DOCUMENTATION CODES:   Non-severe (moderate) malnutrition in context of social or environmental circumstances  INTERVENTION:   Continue Ensure Enlive q 24 hours, each supplement provides 350 kcal and 20 grams of protein  Glucerna Shake po BID, each supplement provides 220 kcal and 10 grams of protein  Multivitamin daily   NUTRITION DIAGNOSIS:   Moderate Malnutrition related to social / environmental circumstances as evidenced by moderate muscle depletion, moderate fat depletion.  GOAL:   Patient will meet greater than or equal to 90% of their needs  MONITOR:   PO intake, Supplement acceptance, Weight trends, Skin, I & O's, Labs  REASON FOR ASSESSMENT:   Consult Wound healing  ASSESSMENT:   Pt with PMH of HTN, HLD, type II DM, diabetic neuropathy, R eye blindness, with multiple recurrent wounds presents with cellulitis of RLE and sepsis r/t wet gangrene/eschar/necrosis    Pt currently refusing amputation recommended by orthopedics and plastic surgery. Per plastic surgery, pt not a good candidate for Acell/VAC treatment.   Pt reports ongoing poor intake given he often times has to prepare his own meals. Pt lives with son and he is not always present or cooks for him. PTA intake questionable given lack of 24 hour care. Recorded PO intake of 75% since admission. Pt requesting Ensure at visit, amenable to nutritional supplementation.   Pt reports likely weight loss however unable to provide amount or time frame. Per weight records, pt's weight trending up slightly.   Suspect more severe degree of malnutrition present, however, limited nutrition focused physical exam obtained.   Labs reviewed; CBG 141-308, Na 133, Albumin 2.4, Prealbumin <5,  Medications reviewed; sliding scale insulin, Lantus, thiamine  NUTRITION - FOCUSED PHYSICAL EXAM:    Most Recent Value  Orbital Region  Moderate depletion  Upper Arm Region  Severe depletion  Thoracic  and Lumbar Region  No depletion  Buccal Region  Moderate depletion  Temple Region  Moderate depletion  Clavicle Bone Region  Moderate depletion  Clavicle and Acromion Bone Region  Moderate depletion  Scapular Bone Region  Unable to assess  Dorsal Hand  Unable to assess  Patellar Region  Unable to assess  Anterior Thigh Region  Unable to assess  Posterior Calf Region  Unable to assess  Edema (RD Assessment)  Unable to assess     Diet Order:  Diet heart healthy/carb modified Room service appropriate? Yes; Fluid consistency: Thin  EDUCATION NEEDS:   Not appropriate for education at this time  Skin:  Skin Assessment: Skin Integrity Issues: Skin Integrity Issues:: Other (Comment) Other: wet gangrene at RLE and multiple other wounds   Last BM:  10/10/17  Height:   Ht Readings from Last 1 Encounters:  10/10/17 5\' 7"  (1.702 m)   Weight:   Wt Readings from Last 1 Encounters:  10/10/17 160 lb 0.9 oz (72.6 kg)   Ideal Body Weight:  67.3 kg  BMI:  Body mass index is 25.07 kg/m.  Estimated Nutritional Needs:   Kcal:  1800-2000  Protein:  100-115 grams  Fluid:  >/= 1.8 L/d  [place] Ranger, MS, RDN, LDN 10/11/2017 3:12 PM

## 2017-10-11 NOTE — Consult Note (Signed)
ORTHOPAEDIC CONSULTATION  REQUESTING PHYSICIAN: Alma Friendly, MD  Chief Complaint: Painful gangrene right lower extremity.  HPI: Andrew [name] is a 72 y.o. male who presents with severe peripheral vascular disease that is involved upper and lower extremities he currently has gangrene which extends halfway up the right tibia.  Patient states that he has not ambulated in a while.  He states he has a long history of tobacco use.  Past Medical History:  Diagnosis Date  . Blind right eye    S/P shotgun  . Bursitis    right Olecranon  . Chronic bronchitis ([name])   . Diabetic neuropathy ([place])   . Hyperlipemia   . Hypertension   . 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 Type II diabetes mellitus ([place])   . Wound cellulitis    right elbow open   Past Surgical History:  Procedure Laterality Date  . APPLICATION OF A-CELL OF EXTREMITY Right 10/15/2016   Procedure: APPLICATION OF A-CELL OF EXTREMITY;  Surgeon: Loel Lofty Dillingham, DO;  Location: [place];  Service: Plastics;  Laterality: Right;  . APPLICATION OF A-CELL OF EXTREMITY Right 11/19/2016   Procedure: APPLICATION OF A-CELL OF EXTREMITY;  Surgeon: Loel Lofty Dillingham, DO;  Location: [place];  Service: Plastics;  Laterality: Right;  . APPLICATION OF A-CELL OF EXTREMITY Right 01/13/2017   Procedure: APPLICATION OF A-CELL OF EXTREMITY;  Surgeon: Wallace Going, DO;  Location: [place];  Service: Plastics;  Laterality: Right;  . APPLICATION OF WOUND VAC Right 10/09/2016   Procedure: APPLICATION OF WOUND VAC;  Surgeon: Leandrew Koyanagi, MD;  Location: [place];  Service: Orthopedics;  Laterality: Right;  . APPLICATION OF WOUND VAC Right 10/15/2016   Procedure: WOUND VAC CHANGE;  Surgeon: Loel Lofty Dillingham, DO;  Location: [place];  Service: Plastics;  Laterality: Right;  . APPLICATION OF WOUND VAC Right 11/19/2016   Procedure: APPLICATION OF WOUND VAC;  Surgeon: Loel Lofty Dillingham, DO;  Location: [place];   Service: Plastics;  Laterality: Right;  . GRAFT APPLICATION Right 12/18/5463   Procedure: GRAFT APPLICATION  RIGHT UPPER ARM FROM DONOR SITE;  Surgeon: Wallace Going, DO;  Location: [place];  Service: Plastics;  Laterality: Right;  . I&D EXTREMITY Right 10/09/2016   Procedure: IRRIGATION AND DEBRIDEMENT EXTREMITY RIGHT ARM WOUND;  Surgeon: Leandrew Koyanagi, MD;  Location: [place];  Service: Orthopedics;  Laterality: Right;  . I&D EXTREMITY N/A 10/11/2016   Procedure: IRRIGATION AND DEBRIDEMENT EXTREMITY RIGHT ARM WOUND;  Surgeon: Leandrew Koyanagi, MD;  Location: [place];  Service: Orthopedics;  Laterality: N/A;  . I&D EXTREMITY Right 10/15/2016   Procedure: IRRIGATION AND DEBRIDEMENT EXTREMITY/RIGHT ELBOW;  Surgeon: Loel Lofty Dillingham, DO;  Location: [place];  Service: Plastics;  Laterality: Right;  . INCISION AND DRAINAGE OF WOUND Right 10/09/2016   arm  . INCISION AND DRAINAGE OF WOUND Right 11/19/2016   Procedure: IRRIGATION AND DEBRIDEMENT WOUND;  Surgeon: Loel Lofty Dillingham, DO;  Location: [place];  Service: Plastics;  Laterality: Right;  . SKIN SPLIT GRAFT Right 01/13/2017   Procedure: SKIN GRAFT SPLIT THICKNESS;  Surgeon: Wallace Going, DO;  Location: [place];  Service: Plastics;  Laterality: Right;   Social History   Socioeconomic History  . Marital status: Widowed    Spouse name: None  . Number of children: None  . Years of education: None  . Highest education level: None  Social Needs  . Financial resource strain: None  .  Food insecurity - worry: None  . Food insecurity - inability: None  . Transportation needs - medical: None  . Transportation needs - non-medical: None  Occupational History  . None  Tobacco Use  . Smoking status: Current Every Day Smoker    Packs/day: 1.50    Years: 63.00    Pack years: 94.50    Types: Cigarettes  . Smokeless tobacco: Former Systems developer    Types: Chew  Substance and Sexual Activity  . Alcohol use: Yes    Comment: 01/12/2017 "nothing in 3 years"  .  Drug use: No  . Sexual activity: None  Other Topics Concern  . None  Social History Narrative  . None   Family History  Problem Relation Age of Onset  . Diabetes Mellitus II Mother   . Diabetes Mellitus II Father   . Diabetes Mellitus II Sister   . Diabetes Mellitus II Sister    - negative except otherwise stated in the family history section Allergies  Allergen Reactions  . Penicillins Rash    Tolerated Zosyn Jan 2018  Possibly rash? Has patient had a PCN reaction causing immediate rash, facial/tongue/throat swelling, SOB or lightheadedness with hypotension: YES Has patient had a PCN reaction causing severe rash involving mucus membranes or skin necrosis:NO Has patient had a PCN reaction that required hospitalization NO Has patient had a PCN reaction occurring within the last 10 years:NO If all of the above answers are "NO", then may proceed with Cephal   Prior to Admission medications   Medication Sig Start Date End Date Taking? Authorizing Provider  amLODipine (NORVASC) 10 MG tablet Take 1 tablet (10 mg total) by mouth daily. 04/15/15  Yes Eugenie Filler, MD  aspirin EC 325 MG tablet Take 325 mg by mouth daily.   Yes [provider]  feeding supplement, ENSURE ENLIVE, (ENSURE ENLIVE) LIQD Take 237 mLs by mouth daily. Patient taking differently: Take 237 mLs by mouth daily.  09/15/16  Yes Oswald Hillock, MD  furosemide (LASIX) 20 MG tablet Take 20 mg by mouth daily. 10/29/16  Yes [provider]  gabapentin (NEURONTIN) 100 MG capsule Take 100 mg by mouth 3 (three) times daily.   Yes [provider]  ibuprofen (ADVIL,MOTRIN) 200 MG tablet Take 400 mg by mouth every 6 (six) hours as needed for headache (pain).   Yes [provider]  Insulin Glargine (TOUJEO SOLOSTAR) 300 UNIT/ML SOPN Inject 64-66 Units into the skin daily as needed (CBG >200).    Yes [provider]  metFORMIN (GLUCOPHAGE-XR) 500 MG 24 hr tablet Take 1,000 mg by mouth 2  (two) times daily.   Yes [provider]  metoprolol succinate (TOPROL-XL) 50 MG 24 hr tablet Take 50 mg by mouth daily. 09/20/17  Yes [provider]  naproxen sodium (ALEVE) 220 MG tablet Take 440 mg by mouth 2 (two) times daily as needed (pain/headache).   Yes [provider]  oxyCODONE-acetaminophen (PERCOCET/ROXICET) 5-325 MG tablet Take 1-2 tablets by mouth every 6 (six) hours as needed for moderate pain or severe pain. 10/16/16  Yes Ghimire, Henreitta Leber, MD  simvastatin (ZOCOR) 40 MG tablet Take 40 mg by mouth daily.   Yes [provider]  sitaGLIPtin (JANUVIA) 100 MG tablet Take 100 mg by mouth daily.   Yes [provider]  thiamine 100 MG tablet Take 1 tablet (100 mg total) by mouth daily. 09/16/16  Yes Oswald [place], MD  HYDROcodone-acetaminophen (NORCO) 5-325 MG tablet Take 1 tablet by  mouth every 12 (twelve) hours as needed for moderate pain. Patient not taking: Reported on 10/10/2017 11/19/16   Dillingham, Loel Lofty, DO  HYDROcodone-acetaminophen (NORCO) 5-325 MG tablet Take 1 tablet by mouth every 6 (six) hours as needed for moderate pain. Patient not taking: Reported on 10/10/2017 01/13/17   Rayburn, Neta Mends, PA-C  insulin aspart (NOVOLOG) 100 UNIT/ML injection Sliding scale insulin Less than 70 initiate hypoglycemia protocol 70-120  0 units 120-150 1 unit 151-200 2 units 201-250 3 units 251-300 5 units 301-350 7 units 351-400 9 units  Greater than 400 call MD Patient not taking: Reported on 11/19/2016 09/15/16   Oswald Hillock, MD  insulin glargine (LANTUS) 100 UNIT/ML injection Inject 0.16 mLs (16 Units total) into the skin at bedtime. Patient not taking: Reported on 11/19/2016 10/16/16   Jonetta Osgood, MD  metoprolol succinate (TOPROL-XL) 100 MG 24 hr tablet Take 1 tablet (100 mg total) by mouth daily. Take with or immediately following a meal. Patient not taking: Reported on 10/10/2017 09/16/16   Oswald [place]ock, MD   Dg Tibia/fibula  Right  Result Date: 10/10/2017 CLINICAL DATA:  Diabetic lower extremity infection EXAM: RIGHT FOOT COMPLETE - 3+ VIEW; RIGHT TIBIA AND FIBULA - 2 VIEW COMPARISON:  None. FINDINGS: There is an ulcerative wound at the right lateral malleolus soft tissues. There is extensive soft tissue emphysema beginning at the level of the right ankle and extending superiorly along the medial and lateral soft tissues of the right leg to the level of the mid tibia. There is no fracture or dislocation. No osteolysis. Bones are generally osteopenic. IMPRESSION: 1. Ulcerative lesion at the right lateral malleolus with soft tissue emphysema tracking superiorly within the anterior and lateral leg to the level of the mid tibia. Findings are consistent with gangrenous infection. In the appropriate context, the findings would be compatible with a clinical diagnosis of necrotizing fasciitis. 2. No radiographic evidence of active infectious osteitis. Electronically Signed   By: Ulyses Jarred M.D.   On: 10/10/2017 17:20   Dg Foot Complete Right  Result Date: 10/10/2017 CLINICAL DATA:  Diabetic lower extremity infection EXAM: RIGHT FOOT COMPLETE - 3+ VIEW; RIGHT TIBIA AND FIBULA - 2 VIEW COMPARISON:  None. FINDINGS: There is an ulcerative wound at the right lateral malleolus soft tissues. There is extensive soft tissue emphysema beginning at the level of the right ankle and extending superiorly along the medial and lateral soft tissues of the right leg to the level of the mid tibia. There is no fracture or dislocation. No osteolysis. Bones are generally osteopenic. IMPRESSION: 1. Ulcerative lesion at the right lateral malleolus with soft tissue emphysema tracking superiorly within the anterior and lateral leg to the level of the mid tibia. Findings are consistent with gangrenous infection. In the appropriate context, the findings would be compatible with a clinical diagnosis of necrotizing fasciitis. 2. No radiographic evidence of active  infectious osteitis. Electronically Signed   By: Ulyses Jarred M.D.   On: 10/10/2017 17:20   - pertinent xrays, CT, MRI studies were reviewed and independently interpreted  Positive ROS: All other systems have been reviewed and were otherwise negative with the exception of those mentioned in the HPI and as above.  Physical Exam: General: Alert, no acute distress Psychiatric: Patient is competent for consent with normal mood and affect Lymphatic: No axillary or cervical lymphadenopathy Cardiovascular: No pedal edema Respiratory: No cyanosis, no use of accessory musculature GI: No organomegaly, abdomen is soft and non-tender  Skin:  Examination patient has gangrene with purulent drainage that extends up to the mid tibia on the right.  There are no skin changes or ulcerations on the left lower extremity.  Images:  @ENCIMAGES @   Neurologic: Patient does not have protective sensation bilateral lower extremities.   MUSCULOSKELETAL:  On examination patient has a strong dorsalis pedis pulse in the left there are no ischemic changes to the left lower extremity.  Examination of the right lower extremity patient has black gangrenous changes with purulent drainage from the forefoot all the way up to his mid tibia.  Patient does have a palpable femoral pulse but I cannot palpate a dorsalis pedis or popliteal pulse.  Assessment: Assessment: Diabetic insensate neuropathy with peripheral vascular disease with gangrene of the right lower extremity up to the mid tibia.  Plan: Plan: Patient's only surgical option is an above-the-knee amputation on the right.  Patient does not have any limb salvage options below the knee.  Patient and family need to determine if they want to proceed with surgical intervention or considered comfort care.  I would be available to proceed with surgery on Wednesday evening.  Please let me know what the medical team and family think is appropriate action.  Thank you for the  consult and the opportunity to see Mr. Alphonza Tramell, [place] [phone] 5:44 PM

## 2017-10-12 ENCOUNTER — Inpatient Hospital Stay (HOSPITAL_COMMUNITY): Payer: Medicare Other

## 2017-10-12 DIAGNOSIS — Z515 Encounter for palliative care: Secondary | ICD-10-CM

## 2017-10-12 DIAGNOSIS — L0390 Cellulitis, unspecified: Secondary | ICD-10-CM

## 2017-10-12 DIAGNOSIS — Z7189 Other specified counseling: Secondary | ICD-10-CM

## 2017-10-12 LAB — CBC WITH DIFFERENTIAL/PLATELET
Basophils Absolute: 0 10*3/uL (ref 0.0–0.1)
Basophils Relative: 0 %
EOS PCT: 0 %
Eosinophils Absolute: 0 10*3/uL (ref 0.0–0.7)
HCT: 30.2 % — ABNORMAL LOW (ref 39.0–52.0)
Hemoglobin: 9.9 g/dL — ABNORMAL LOW (ref 13.0–17.0)
LYMPHS ABS: 1.7 10*3/uL (ref 0.7–4.0)
Lymphocytes Relative: 6 %
MCH: 27.7 pg (ref 26.0–34.0)
MCHC: 32.8 g/dL (ref 30.0–36.0)
MCV: 84.6 fL (ref 78.0–100.0)
MONO ABS: 2.5 10*3/uL — AB (ref 0.1–1.0)
Monocytes Relative: 9 %
Neutro Abs: 24.1 10*3/uL — ABNORMAL HIGH (ref 1.7–7.7)
Neutrophils Relative %: 85 %
PLATELETS: 636 10*3/uL — AB (ref 150–400)
RBC: 3.57 MIL/uL — AB (ref 4.22–5.81)
RDW: 15.4 % (ref 11.5–15.5)
WBC: 28.3 10*3/uL — AB (ref 4.0–10.5)

## 2017-10-12 LAB — PROCALCITONIN: Procalcitonin: 0.22 ng/mL

## 2017-10-12 LAB — BASIC METABOLIC PANEL
Anion gap: 12 (ref 5–15)
BUN: 9 mg/dL (ref 6–20)
CHLORIDE: 100 mmol/L — AB (ref 101–111)
CO2: 22 mmol/L (ref 22–32)
CREATININE: 0.52 mg/dL — AB (ref 0.61–1.24)
Calcium: 8.3 mg/dL — ABNORMAL LOW (ref 8.9–10.3)
GFR calc Af Amer: >60 mL/min (ref >60)
Glucose, Bld: 167 mg/dL — ABNORMAL HIGH (ref 65–99)
Potassium: 3.4 mmol/L — ABNORMAL LOW (ref 3.5–5.1)
SODIUM: 134 mmol/L — AB (ref 135–145)

## 2017-10-12 LAB — HEMOGLOBIN A1C
HEMOGLOBIN A1C: 9.8 % — AB (ref 4.8–5.6)
Mean Plasma Glucose: 235 mg/dL

## 2017-10-12 LAB — GLUCOSE, CAPILLARY
Glucose-Capillary: 162 mg/dL — ABNORMAL HIGH (ref 65–99)
Glucose-Capillary: 198 mg/dL — ABNORMAL HIGH (ref 65–99)
Glucose-Capillary: 202 mg/dL — ABNORMAL HIGH (ref 65–99)
Glucose-Capillary: 188 mg/dL — ABNORMAL HIGH (ref 65–99)

## 2017-10-12 MED ORDER — INSULIN ASPART 100 UNIT/ML ~~LOC~~ SOLN: 3.0000 [IU] | Freq: Three times a day (TID) | SUBCUTANEOUS | Status: DC

## 2017-10-12 MED ORDER — AMLODIPINE BESYLATE 5 MG PO TABS
5.0000 mg | ORAL_TABLET | Freq: Every day | ORAL | Status: DC
  Administered 2017-10-12 – 2017-10-21 (×9): 5 mg via ORAL
  Filled 2017-10-12 (×9): qty 1

## 2017-10-12 MED ORDER — CHLORHEXIDINE GLUCONATE CLOTH 2 % EX PADS
6.0000 | MEDICATED_PAD | Freq: Every day | CUTANEOUS | Status: AC
  Administered 2017-10-13 – 2017-10-16 (×4): 6 via TOPICAL

## 2017-10-12 MED ORDER — METOPROLOL SUCCINATE ER 50 MG PO TB24
50.0000 mg | ORAL_TABLET | Freq: Every day | ORAL | Status: DC
  Administered 2017-10-12 – 2017-10-21 (×10): 50 mg via ORAL
  Filled 2017-10-12 (×10): qty 1

## 2017-10-12 MED ORDER — INSULIN GLARGINE 100 UNIT/ML ~~LOC~~ SOLN
20.0000 [IU] | Freq: Every day | SUBCUTANEOUS | Status: DC
  Administered 2017-10-14 – 2017-10-21 (×8): 20 [IU] via SUBCUTANEOUS
  Filled 2017-10-12 (×9): qty 0.2

## 2017-10-12 MED ORDER — MUPIROCIN 2 % EX OINT
1.0000 "application " | TOPICAL_OINTMENT | Freq: Two times a day (BID) | CUTANEOUS | Status: AC
  Administered 2017-10-12 – 2017-10-16 (×10): 1 via NASAL
  Filled 2017-10-12 (×5): qty 22

## 2017-10-12 MED ORDER — METRONIDAZOLE 500 MG PO TABS
500.0000 mg | ORAL_TABLET | Freq: Three times a day (TID) | ORAL | Status: DC
  Administered 2017-10-12 – 2017-10-18 (×16): 500 mg via ORAL
  Filled 2017-10-12 (×16): qty 1

## 2017-10-12 NOTE — Progress Notes (Signed)
PROGRESS NOTE  Andrew [name] RWE:[id number] DOB: [birth year] DOA: 10/10/2017 PCP: Christain Sacramento, MD  HPI/Recap of past 24 hours: Andrew [name] is a 72 y.o. male with medical history significant for Type 2 DM, HTN, HLD, R eye blindness, tobacco abuse presents to the ED c/o worsening RLE cellulitis. Pt reported he sustained a wound to the foot about 3 weeks ago, that gradually spread to his lower leg. Pt reported his PCP prescribed PO keflex for 10 days of which he was compliant to, but didn't help with the infection. Pt denies any fever/chills, nausea/vomiting, chest pain, SOB, abdominal pain. Of note, pt has had recurrent cellulitis with very poor wound healing. Last R arm infection, required a skin graft. In the ED, noted was a very foul smelling/wet gangrene/eschar on the ant lower leg to dorsum of foot. Surrounding erythema noted. Pt was noted to be septic and admitted for further management.  Today, pt still reported R leg pain, denies any chest pain, fever/chills, SOB, abdominal pain, N/V/D/C. Pt has agreed for amputation of his R leg  Assessment/Plan: Principal Problem:   Cellulitis Active Problems:   Hyperlipidemia   Tobacco abuse disorder   Essential hypertension   Diabetic autonomic neuropathy associated with type 2 diabetes mellitus (HCC)   Legal blindness   Atherosclerosis of native arteries of extremities with gangrene, right leg (HCC)  Sepsis due to right leg cellulitis complicated by gangrene/eschar/necrosis Elevated WBC 30s, elevated LA on admission Currently afebrile, with persistent leukocytosis LA trended down, procalcitonin 0.16 ESR 92,  CRP 13.5, pre-albumin <5 X ray of RLE: Findings are consistent with gangrenous infection. Findings would be compatible with a clinical diagnosis of necrotizing fasciitis Vascular for ABI: pending Continue IV Vanc + IV Cefepime + IV metronidazole  Consulted orthopedics- Rec amputation, pt currently in agreement for  amputation Plastic surgery consulted, rec amputation, not a good candidate for Acell/VAC treatment WOC on board Monitor closely, telemetry  Type 2 DM A1c 9.8 SSI, lantus 20U daily Held home regimen Continue gabapentin  HTN Stable Continue home BB, norvasc reduced to 59m, may titrate up  HLD LDL 18 Continue zocor  Tobacco abuse Advised to quit Nicotine patch ordered     Code Status: Full   Family Communication: None at bedside. Attempted to call daughter, went to voicemail. Daughter called back left a message to call son, attempted to call son, voicemail. Called palliative team for [place] Disposition Plan: TBD   Consultants:  Orthopedics  Plastic surgery  Procedures:  None  Antimicrobials:  IV Vanc  IV Cefepime  IV Metronidazole  DVT prophylaxis:  SCD, pending possible amputation   Objective: Vitals:   10/11/17 1640 10/11/17 2046 10/12/17 0602 10/12/17 1144  BP: (!) 124/56 132/60 (!) 120/95 (!) 133/54  Pulse: 72 90 81 88  Resp: _0 Temp: 98.2 F (36.8 C) 98.4 F (36.9 C) 98.2 F (36.8 C) 97.7 F (36.5 C)  TempSrc: Oral Oral Oral Oral  SpO2: 96% 95% 96% 97%  Weight:  72.6 kg (160 lb 1 oz)    Height:        Intake/Output Summary (Last 24 hours) at 10/12/2017 1746 Last data filed at 10/12/2017 1400 Gross per 24 hour  Intake 3506.67 ml  Output 1225 ml  Net 2281.67 ml   Filed Weights   10/10/17 2014 10/11/17 2046  Weight: 72.6 kg (160 lb 0.9 oz) 72.6 kg (160 lb 1 oz)    Exam:   General: NAD, ill-looking  Cardiovascular: S1, S2 present   Respiratory: CTA  Abdomen: Soft, NT, ND, BS+  Musculoskeletal: RLE noted with foul smelling/gangrene/eschar/necrotic, ant lower leg to dorsum of foot. Surrounding erythema noted. Currently in ACE wrap  Skin: Normal except for RLE  Psychiatry: Normal mood    Data Reviewed: CBC: Recent Labs  Lab 10/10/17 1451 10/11/17 0538 10/12/17 0743  WBC 34.6* 25.8* 28.3*  NEUTROABS  30.8* 21.9* 24.1*  HGB 11.3* 9.6* 9.9*  HCT 33.6* 29.1* 30.2*  MCV 83.2 84.8 84.6  PLT 670* 600* 989*   Basic Metabolic Panel: Recent Labs  Lab 10/10/17 1451 10/11/17 0538 10/12/17 0743  NA 129* 133* 134*  K 4.5 3.7 3.4*  CL 92* 99* 100*  CO2 _0 GLUCOSE 295* 137* 167*  BUN _1 CREATININE 0.79 0.60* 0.52*  CALCIUM 8.8* 8.2* 8.3*   GFR: Estimated Creatinine Clearance: 78 mL/min (A) (by C-G formula based on SCr of 0.52 mg/dL (L)). Liver Function Tests: Recent Labs  Lab 10/10/17 1451  AST 50*  ALT 27  ALKPHOS 178*  BILITOT 0.5  PROT 6.6  ALBUMIN 2.4*   No results for input(s): LIPASE, AMYLASE in the last 168 hours. No results for input(s): AMMONIA in the last 168 hours. Coagulation Profile: No results for input(s): INR, PROTIME in the last 168 hours. Cardiac Enzymes: No results for input(s): CKTOTAL, CKMB, CKMBINDEX, TROPONINI in the last 168 hours. BNP (last 3 results) No results for input(s): PROBNP in the last 8760 hours. HbA1C: Recent Labs    10/11/17 0538  HGBA1C 9.8*   CBG: Recent Labs  Lab 10/11/17 1645 10/11/17 2046 10/12/17 0735 10/12/17 1141 10/12/17 1719  GLUCAP 163* 245* 162* 198* 202*   Lipid Profile: Recent Labs    10/11/17 0538  CHOL 72  HDL 18*  LDLCALC 38  TRIG 78  CHOLHDL 4.0   Thyroid Function Tests: No results for input(s): TSH, T4TOTAL, FREET4, T3FREE, THYROIDAB in the last 72 hours. Anemia Panel: No results for input(s): VITAMINB12, FOLATE, FERRITIN, TIBC, IRON, RETICCTPCT in the last 72 hours. Urine analysis:    Component Value Date/Time   COLORURINE YELLOW 08/30/2016 1926   APPEARANCEUR HAZY (A) 08/30/2016 1926   LABSPEC 1.020 08/30/2016 1926   PHURINE 5.0 08/30/2016 1926   GLUCOSEU >=500 (A) 08/30/2016 1926   HGBUR MODERATE (A) 08/30/2016 1926   BILIRUBINUR NEGATIVE 08/30/2016 1926   KETONESUR 20 (A) 08/30/2016 1926   PROTEINUR 100 (A) 08/30/2016 1926   UROBILINOGEN 0.2 04/18/2015 0210   NITRITE  NEGATIVE 08/30/2016 1926   LEUKOCYTESUR NEGATIVE 08/30/2016 1926   Sepsis Labs: _2 (procalcitonin:4,lacticidven:4)  ) Recent Results (from the past 240 hour(s))  Culture, blood (routine x 2)     Status: None (Preliminary result)   Collection Time: 10/10/17  4:20 PM  Result Value Ref Range Status   Specimen Description BLOOD RIGHT HAND  Final   Special Requests   Final    BOTTLES DRAWN AEROBIC AND ANAEROBIC Blood Culture adequate volume   Culture   Final    NO GROWTH 2 DAYS Performed at [facility], [place] [address], [place], [place] [address]    Report Status PENDING  Incomplete  Culture, blood (routine x 2)     Status: None (Preliminary result)   Collection Time: 10/10/17  4:35 PM  Result Value Ref Range Status   Specimen Description BLOOD RIGHT ANTECUBITAL  Final   Special Requests   Final    BOTTLES DRAWN AEROBIC AND ANAEROBIC Blood Culture adequate  volume   Culture   Final    NO GROWTH 2 DAYS Performed at [facility], [place] [address], [place], [place] [address]    Report Status PENDING  Incomplete  MRSA PCR Screening     Status: Abnormal   Collection Time: 10/10/17  8:32 PM  Result Value Ref Range Status   MRSA by PCR POSITIVE (A) NEGATIVE Final    Comment:        The GeneXpert MRSA Assay (FDA approved for NASAL specimens only), is one component of a comprehensive MRSA colonization surveillance program. It is not intended to diagnose MRSA infection nor to guide or monitor treatment for MRSA infections. RESULT CALLED TO, READ BACK BY AND VERIFIED WITH: MITZ,A RN 10/10/17 AT 2227 SKEEN,P       Studies: No results found.  Scheduled Meds: . aspirin EC  325 mg Oral Daily  . Chlorhexidine Gluconate Cloth  6 each Topical Q0600  . feeding supplement (ENSURE ENLIVE)  237 mL Oral Q24H  . feeding supplement (GLUCERNA SHAKE)  237 mL Oral BID BM  . gabapentin  100 mg Oral TID  . insulin aspart  0-5 Units Subcutaneous QHS  . insulin aspart  0-9  Units Subcutaneous TID WC  . [START ON 10/13/2017] insulin aspart  3 Units Subcutaneous TID WC  . [START ON 10/13/2017] insulin glargine  20 Units Subcutaneous Daily  . metroNIDAZOLE  500 mg Oral Q8H  . multivitamin with minerals  1 tablet Oral Daily  . mupirocin ointment  1 application Nasal BID  . nicotine  21 mg Transdermal QHS  . simvastatin  40 mg Oral Daily  . sodium chloride flush  3 mL Intravenous Q12H  . thiamine  100 mg Oral Daily    Continuous Infusions: . ceFEPime (MAXIPIME) IV Stopped (10/12/17 1140)  . vancomycin Stopped (10/12/17 0724)     LOS: 2 days     Alma Friendly, MD [facility]   If 7PM-7AM, please contact night-coverage [email or web address] Password [facility] 10/12/2017, 5:46 PM

## 2017-10-12 NOTE — Progress Notes (Signed)
Inpatient Diabetes Program Recommendations  AACE/ADA: New Consensus Statement on Inpatient Glycemic Control (2015)  Target Ranges:  Prepandial:   less than 140 mg/dL      Peak postprandial:   less than 180 mg/dL (1-2 hours)      Critically ill patients:  140 - 180 mg/dL   Lab Results  Component Value Date   GLUCAP 198 (H) 10/12/2017   HGBA1C 9.8 (H) 10/11/2017    Review of Glycemic ControlResults for DAMONTRE, MILLEA (MRN [id number]) as of 10/12/2017 12:32  Ref. Range 10/11/2017 11:40 10/11/2017 16:45 10/11/2017 20:46 10/12/2017 07:35 10/12/2017 11:41  Glucose-Capillary Latest Ref Range: 65 - 99 mg/dL 151 (H) 163 (H) 245 (H) 162 (H) 198 (H)   Diabetes history: Type 2 DM Outpatient Diabetes medications: Toujeo 60 units daily, Januvia 100 mg daily, Metformin 1000 mg bid Current orders for Inpatient glycemic control:  Lantus 15 units daily, Novolog sensitive tid with meals and HS  Inpatient Diabetes Program Recommendations:   Please consider increasing Lantus to 20 units daily and add Novolog meal coverage 3 units tid with meals (hold if patient eats less than 50%).   Thanks,  Adah Perl, RN, BC-ADM Inpatient Diabetes Coordinator Pager [phone] (8a-5p)

## 2017-10-12 NOTE — Progress Notes (Signed)
Palliative:  Full note to follow. I met with Andrew [name] who shares with me that "they are cutting off my leg tomorrow." He seems somewhat oriented but struggles to understand our conversation regarding [name]. He repeatedly tells me "I don't have a choice." I attempted to explain to him that he does have a choice and does not have to pursue surgery BUT his other option would be to focus on pain control and comfort recognizing that he will die from this condition. We also discussed that surgery is also risky and there are complications and chances he will not recovery from the surgery.   My other concern is best case scenario where he will be cared for post-op if he does well. He did not have the care he needed at home prior to admission. He has refused SNF in the past. He would not be safe to return home.   He also expresses that he would not desire life support, CPR, or ventilator support.   Unfortunately, he does understand somewhat his situation but does not understand the consequences and details of these difficult decisions. I have attempted to reach out to his daughter and son and even enlisted a neighbor to assist in reaching them but without success. I will continue to attempt to contact family as they need to know plans for surgery at the least.   Vinie Sill, NP Palliative Medicine Team Pager # [phone] (M-F 8a-5p) Team Phone # [phone] (Nights/Weekends)

## 2017-10-12 NOTE — Progress Notes (Signed)
VASCULAR LAB PRELIMINARY  ARTERIAL  ABI completed: Unreliable ABI due to calcified vessels.     RIGHT    LEFT    PRESSURE WAVEFORM  PRESSURE WAVEFORM  BRACHIAL 164 Triphasic BRACHIAL 171 Triphasic  DP Not obtainable  DP    AT   AT    PT 156 biphasic PT 168 biphasic  GREAT TOE 0.25 NA GREAT TOE 0.58 NA    RIGHT LEFT  ABI 0.91 0.98     Claudetta Sallie D, RVT 10/12/2017, 4:36 PM
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
## 2017-10-13 ENCOUNTER — Telehealth (INDEPENDENT_AMBULATORY_CARE_PROVIDER_SITE_OTHER): Payer: Self-pay | Admitting: *Deleted

## 2017-10-13 DIAGNOSIS — E440 Moderate protein-calorie malnutrition: Secondary | ICD-10-CM

## 2017-10-13 DIAGNOSIS — Z7189 Other specified counseling: Secondary | ICD-10-CM

## 2017-10-13 DIAGNOSIS — Z515 Encounter for palliative care: Secondary | ICD-10-CM

## 2017-10-13 LAB — BASIC METABOLIC PANEL
Anion gap: 13 (ref 5–15)
BUN: 7 mg/dL (ref 6–20)
CALCIUM: 8.3 mg/dL — AB (ref 8.9–10.3)
CHLORIDE: 98 mmol/L — AB (ref 101–111)
CO2: 21 mmol/L — AB (ref 22–32)
Creatinine, Ser: 0.49 mg/dL — ABNORMAL LOW (ref 0.61–1.24)
GFR calc Af Amer: >60 mL/min (ref >60)
GFR calc non Af Amer: >60 mL/min (ref >60)
GLUCOSE: 159 mg/dL — AB (ref 65–99)
Potassium: 3.5 mmol/L (ref 3.5–5.1)
Sodium: 132 mmol/L — ABNORMAL LOW (ref 135–145)

## 2017-10-13 LAB — CBC WITH DIFFERENTIAL/PLATELET
BAND NEUTROPHILS: 2 %
BASOS PCT: 0 %
Basophils Absolute: 0 10*3/uL (ref 0.0–0.1)
Blasts: 0 %
EOS ABS: 0 10*3/uL (ref 0.0–0.7)
EOS PCT: 0 %
HCT: 30.1 % — ABNORMAL LOW (ref 39.0–52.0)
Hemoglobin: 10 g/dL — ABNORMAL LOW (ref 13.0–17.0)
LYMPHS ABS: 1.9 10*3/uL (ref 0.7–4.0)
Lymphocytes Relative: 7 %
MCH: 28.2 pg (ref 26.0–34.0)
MCHC: 33.2 g/dL (ref 30.0–36.0)
MCV: 84.8 fL (ref 78.0–100.0)
MONO ABS: 2.2 10*3/uL — AB (ref 0.1–1.0)
MONOS PCT: 8 %
Metamyelocytes Relative: 1 %
Myelocytes: 0 %
NEUTROS ABS: 22.8 10*3/uL — AB (ref 1.7–7.7)
Neutrophils Relative %: 82 %
OTHER: 0 %
PLATELETS: 737 10*3/uL — AB (ref 150–400)
PROMYELOCYTES ABS: 0 %
RBC: 3.55 MIL/uL — ABNORMAL LOW (ref 4.22–5.81)
RDW: 15.9 % — AB (ref 11.5–15.5)
WBC: 26.9 10*3/uL — ABNORMAL HIGH (ref 4.0–10.5)
nRBC: 0 /100 WBC

## 2017-10-13 LAB — GLUCOSE, CAPILLARY
GLUCOSE-CAPILLARY: 162 mg/dL — AB (ref 65–99)
Glucose-Capillary: 140 mg/dL — ABNORMAL HIGH (ref 65–99)
Glucose-Capillary: 152 mg/dL — ABNORMAL HIGH (ref 65–99)
Glucose-Capillary: 241 mg/dL — ABNORMAL HIGH (ref 65–99)

## 2017-10-13 MED ORDER — ATORVASTATIN CALCIUM 20 MG PO TABS
20.0000 mg | ORAL_TABLET | Freq: Every day | ORAL | Status: DC
  Administered 2017-10-13 – 2017-10-21 (×9): 20 mg via ORAL
  Filled 2017-10-13 (×9): qty 1

## 2017-10-13 MED ORDER — SODIUM CHLORIDE 0.9 % IV SOLN
INTRAVENOUS | Status: DC
  Administered 2017-10-13 – 2017-10-16 (×3): via INTRAVENOUS

## 2017-10-13 NOTE — Progress Notes (Addendum)
Palliative:  I spoke today with Andrew [name]. He is a very pleasant gentleman. He is aware of his upcoming surgery but does not know when this is to occur. He is very clear that he desires surgery if this is his best chance to stay alive. I do not believe that he is aware that he most likely will not be able to return home after surgery (I worry about need for long term care). He again confirms that he does NOT want life support or breathing machines to keep him alive. I have made multiple attempts to contact children without success. Daughter has texted palliative phone that she is working and unavailable today. I stressed to time sensitivity of matter to her via text without any return calls. He has given me permission to discuss and provide information to his neighbor Andrew [name] [phone]) and she is the only person I have been able to contact.   I discussed with Andrew [name] that I am recommending to proceed with surgery based on my conversation with [name] although I am unsure he understands the details and full consequences of what this means. I explained our conversation that has led to DNR and she says that he has told her this same desire once in the past. She will try to relay this information to daughter. Please contact Andrew [name] with surgery time and with update post-op Andrew [name] [phone]).   Late entry: I have now spoken with daughter, Andrew [name], and discussed same as above. Andrew [name] explains that her father makes his own decisions and does not really listen to her. She does share that he has had poor QOL and declining functional status over past few months. She confirms that she would be concerned that he will insist on returning home and I fear that he will continue to decline without adequate care post-op. He did not discuss tell me yes or no or any feedback regarding likely need for rehab after surgery. For now, goals are clear to proceed with surgery and DNR and daughter  agrees. Palliative will continue to follow and assist.   40 min  Andrew Sill, NP Palliative Medicine Team Pager # [phone] (M-F 8a-5p) Team Phone # [phone] (Nights/Weekends)

## 2017-10-13 NOTE — Telephone Encounter (Signed)
Received a call from [place] at [facility] stating pt was suppose to have amputation today and pt has finally decided to proceed with surgery, nurse states pt has been NPO and is wanting to know if surgery is going to be performed today. I called Cheryl surgery scheduler to see what was going on and if surgery is planned. Malachy Mood states no surgery was to be preformed today d/t needing to confirm with family members which we have not heard back from and stated that pt can eat. I called nurse back and advised her of the following and she voiced understanding and will get pt something to eat, and we will let her know when we get a hold of family member.

## 2017-10-13 NOTE — Progress Notes (Signed)
PROGRESS NOTE    Andrew [name]  YNW:[id number] DOB: 1946-01-28 DOA: 10/10/2017 PCP: Christain Sacramento, MD    Brief Narrative: Andrew Lines Parrishis a 72 y.o.malewith medical history significant forType 2 DM, HTN, HLD, R eye blindness, tobacco abuse presents to the ED c/o worsening RLE cellulitis. Pt reported he sustained a wound to the foot about 3 weeks ago, that gradually spread to his lower leg. Pt reported his PCP prescribed PO keflex for 10 days of which he was compliant to, but didn't help with the infection. Pt denies any fever/chills, nausea/vomiting, chest pain, SOB, abdominal pain. Of note, pt has had recurrent cellulitis with very poor wound healing. Last R arm infection, required a skin graft. In the ED, noted was a very foul smelling/wet gangrene/eschar on the ant lower leg to dorsum of foot. Surrounding erythema noted. Pt was noted to be septic and admitted for further management.   Assessment & Plan:   Principal Problem:   Cellulitis Active Problems:   Hyperlipidemia   Tobacco abuse disorder   Essential hypertension   Diabetic autonomic neuropathy associated with type 2 diabetes mellitus (HCC)   Legal blindness   Atherosclerosis of native arteries of extremities with gangrene, right leg (HCC)   Goals of care, counseling/discussion   Palliative care encounter   Malnutrition of moderate degree  Sepsis due to right leg cellulitis complicated by gangrene/eschar/necrosis Elevated WBC 30s, elevated LA on admission ESR 92,  CRP 13.5, pre-albumin <5 ABI pending.  Plastic surgery consulted, rec amputation, not a good candidate for Acell/VAC treatment Dr Sharol Given recommend Amputation. Palliative will speak with patient and will try to contact family.  Patient relate he doesn't have any other option other than sx.  Await Dr Sharol Given follow up./  Continue with IV vancomycin, cefepime.   DM type 2;  Continue with lantus.  SSI.   Transaminases; repeat labs in am.   HLD; on Zocor.     HTN; continue with Norvasc, metoprolol.   Tobacco abuse Advised to quit Nicotine patch ordered  Hyponatremia; iv fluids.    DVT prophylaxis: SCD Code Status: DNR Family Communication: care discussed with patient  Disposition Plan: to be determine.    Consultants:   Dr Sharol Given.    Procedures:   ABI   Antimicrobials:  Vancomycin, cefepime, flagyl 2-25  Subjective: He is alert, he relates he doesn't have any other choice beside sx.    Objective: Vitals:   10/12/17 1144 10/12/17 1745 10/12/17 2122 10/13/17 0734  BP: (!) 133/54 (!) 145/89 (!) 162/67 (!) 142/88  Pulse: 88 85 68 72  Resp: _0 Temp: 97.7 F (36.5 C) 98.2 F (36.8 C) 98.1 F (36.7 C) 98.3 F (36.8 C)  TempSrc: Oral Oral Oral Oral  SpO2: 97% 98% 96% 96%  Weight:      Height:        Intake/Output Summary (Last 24 hours) at 10/13/2017 1005 Last data filed at 10/13/2017 0600 Gross per 24 hour  Intake 480 ml  Output 1575 ml  Net -1095 ml   Filed Weights   10/10/17 2014 10/11/17 2046  Weight: 72.6 kg (160 lb 0.9 oz) 72.6 kg (160 lb 1 oz)    Examination:  General exam: Appears calm and comfortable  Respiratory system: Clear to auscultation. Respiratory effort normal. Cardiovascular system: S1 & S2 heard, RRR. No JVD, murmurs, rubs, gallops or clicks. No pedal edema. Gastrointestinal system: Abdomen is nondistended, soft and nontender. No organomegaly or masses felt. Normal bowel sounds  heard. Central nervous system: Alert and oriented. No focal neurological deficits. Extremities: right LE with necrosis from knee below anterior aspect leg., redness and foul smelling.      Data Reviewed: I have personally reviewed following labs and imaging studies  CBC: Recent Labs  Lab 10/10/17 1451 10/11/17 0538 10/12/17 0743 10/13/17 0512  WBC 34.6* 25.8* 28.3* 26.9*  NEUTROABS 30.8* 21.9* 24.1* 22.8*  HGB 11.3* 9.6* 9.9* 10.0*  HCT 33.6* 29.1* 30.2* 30.1*  MCV 83.2 84.8 84.6 84.8  PLT  670* 600* 636* 456*   Basic Metabolic Panel: Recent Labs  Lab 10/10/17 1451 10/11/17 0538 10/12/17 0743 10/13/17 0512  NA 129* 133* 134* 132*  K 4.5 3.7 3.4* 3.5  CL 92* 99* 100* 98*  CO2 _0 21*  GLUCOSE 295* 137* 167* 159*  BUN _1 CREATININE 0.79 0.60* 0.52* 0.49*  CALCIUM 8.8* 8.2* 8.3* 8.3*   GFR: Estimated Creatinine Clearance: 78 mL/min (A) (by C-G formula based on SCr of 0.49 mg/dL (L)). Liver Function Tests: Recent Labs  Lab 10/10/17 1451  AST 50*  ALT 27  ALKPHOS 178*  BILITOT 0.5  PROT 6.6  ALBUMIN 2.4*   No results for input(s): LIPASE, AMYLASE in the last 168 hours. No results for input(s): AMMONIA in the last 168 hours. Coagulation Profile: No results for input(s): INR, PROTIME in the last 168 hours. Cardiac Enzymes: No results for input(s): CKTOTAL, CKMB, CKMBINDEX, TROPONINI in the last 168 hours. BNP (last 3 results) No results for input(s): PROBNP in the last 8760 hours. HbA1C: Recent Labs    10/11/17 0538  HGBA1C 9.8*   CBG: Recent Labs  Lab 10/12/17 0735 10/12/17 1141 10/12/17 1719 10/12/17 2114 10/13/17 0727  GLUCAP 162* 198* 202* 188* 162*   Lipid Profile: Recent Labs    10/11/17 0538  CHOL 72  HDL 18*  LDLCALC 38  TRIG 78  CHOLHDL 4.0   Thyroid Function Tests: No results for input(s): TSH, T4TOTAL, FREET4, T3FREE, THYROIDAB in the last 72 hours. Anemia Panel: No results for input(s): VITAMINB12, FOLATE, FERRITIN, TIBC, IRON, RETICCTPCT in the last 72 hours. Sepsis Labs: Recent Labs  Lab 10/10/17 1517 10/10/17 1646 10/10/17 1924 10/11/17 0538 10/12/17 0743  PROCALCITON  --   --  0.16 0.16 0.22  LATICACIDVEN 2.11* 2.05* 1.3  --   --     Recent Results (from the past 240 hour(s))  Culture, blood (routine x 2)     Status: None (Preliminary result)   Collection Time: 10/10/17  4:20 PM  Result Value Ref Range Status   Specimen Description BLOOD RIGHT HAND  Final   Special Requests   Final    BOTTLES  DRAWN AEROBIC AND ANAEROBIC Blood Culture adequate volume   Culture   Final    NO GROWTH 2 DAYS Performed at [facility], [place] [address], [place], [place] [address]    Report Status PENDING  Incomplete  Culture, blood (routine x 2)     Status: None (Preliminary result)   Collection Time: 10/10/17  4:35 PM  Result Value Ref Range Status   Specimen Description BLOOD RIGHT ANTECUBITAL  Final   Special Requests   Final    BOTTLES DRAWN AEROBIC AND ANAEROBIC Blood Culture adequate volume   Culture   Final    NO GROWTH 2 DAYS Performed at [facility], [place] [address] [place], [place] [address]    Report Status PENDING  Incomplete  MRSA PCR Screening  Status: Abnormal   Collection Time: 10/10/17  8:32 PM  Result Value Ref Range Status   MRSA by PCR POSITIVE (A) NEGATIVE Final    Comment:        The GeneXpert MRSA Assay (FDA approved for NASAL specimens only), is one component of a comprehensive MRSA colonization surveillance program. It is not intended to diagnose MRSA infection nor to guide or monitor treatment for MRSA infections. RESULT CALLED TO, READ BACK BY AND VERIFIED WITH: MITZ,A RN 10/10/17 AT [address] [facility]          Radiology Studies: No results found.      Scheduled Meds: . amLODipine  5 mg Oral Daily  . aspirin EC  325 mg Oral Daily  . atorvastatin  20 mg Oral q1800  . Chlorhexidine Gluconate Cloth  6 each Topical Q0600  . feeding supplement (ENSURE ENLIVE)  237 mL Oral Q24H  . feeding supplement (GLUCERNA SHAKE)  237 mL Oral BID BM  . gabapentin  100 mg Oral TID  . insulin aspart  0-5 Units Subcutaneous QHS  . insulin aspart  0-9 Units Subcutaneous TID WC  . insulin aspart  3 Units Subcutaneous TID WC  . insulin glargine  20 Units Subcutaneous Daily  . metoprolol succinate  50 mg Oral Daily  . metroNIDAZOLE  500 mg Oral Q8H  . multivitamin with minerals  1 tablet Oral Daily  . mupirocin ointment  1 application Nasal BID  . nicotine   21 mg Transdermal QHS  . sodium chloride flush  3 mL Intravenous Q12H  . thiamine  100 mg Oral Daily   Continuous Infusions: . ceFEPime (MAXIPIME) IV Stopped (10/12/17 2230)  . vancomycin Stopped (10/13/17 0630)     LOS: 3 days    Time spent: 35 minutes.,     Elmarie Shiley, MD [facility] Pager [phone]  If 7PM-7AM, please contact night-coverage [email or web address] Password [facility] 10/13/2017, 10:05 AM

## 2017-10-13 NOTE — Telephone Encounter (Signed)
Please see below.

## 2017-10-13 NOTE — Telephone Encounter (Addendum)
Received vm on triage phone from Vinie Sill NP [place] at [facility] stating has been in to see pt and has been unsuccessful with getting in touch with family but pt states is wanting to proceed with surgery and if we could go ahead get that set up and she will continue to get a hold of family.   Please call [phone]

## 2017-10-14 LAB — CBC WITH DIFFERENTIAL/PLATELET
Basophils Absolute: 0 10*3/uL (ref 0.0–0.1)
Basophils Relative: 0 %
Eosinophils Absolute: 0.1 10*3/uL (ref 0.0–0.7)
Eosinophils Relative: 0 %
HEMATOCRIT: 30 % — AB (ref 39.0–52.0)
HEMOGLOBIN: 9.9 g/dL — AB (ref 13.0–17.0)
LYMPHS ABS: 2 10*3/uL (ref 0.7–4.0)
Lymphocytes Relative: 8 %
MCH: 27.6 pg (ref 26.0–34.0)
MCHC: 33 g/dL (ref 30.0–36.0)
MCV: 83.6 fL (ref 78.0–100.0)
MONOS PCT: 9 %
Monocytes Absolute: 2.3 10*3/uL — ABNORMAL HIGH (ref 0.1–1.0)
NEUTROS ABS: 20.4 10*3/uL — AB (ref 1.7–7.7)
NEUTROS PCT: 83 %
Platelets: 752 10*3/uL — ABNORMAL HIGH (ref 150–400)
RBC: 3.59 MIL/uL — ABNORMAL LOW (ref 4.22–5.81)
RDW: 15.4 % (ref 11.5–15.5)
WBC: 24.8 10*3/uL — ABNORMAL HIGH (ref 4.0–10.5)

## 2017-10-14 LAB — VANCOMYCIN, TROUGH: Vancomycin Tr: 13 ug/mL — ABNORMAL LOW (ref 15–20)

## 2017-10-14 LAB — HEPATIC FUNCTION PANEL
ALT: 16 U/L — ABNORMAL LOW (ref 17–63)
AST: 26 U/L (ref 15–41)
Albumin: 1.9 g/dL — ABNORMAL LOW (ref 3.5–5.0)
Alkaline Phosphatase: 142 U/L — ABNORMAL HIGH (ref 38–126)
BILIRUBIN TOTAL: 0.3 mg/dL (ref 0.3–1.2)
Total Protein: 5.3 g/dL — ABNORMAL LOW (ref 6.5–8.1)

## 2017-10-14 LAB — AMMONIA: Ammonia: 36 umol/L — ABNORMAL HIGH (ref 9–35)

## 2017-10-14 LAB — BASIC METABOLIC PANEL
ANION GAP: 9 (ref 5–15)
BUN: 9 mg/dL (ref 6–20)
CHLORIDE: 99 mmol/L — AB (ref 101–111)
CO2: 25 mmol/L (ref 22–32)
CREATININE: 0.54 mg/dL — AB (ref 0.61–1.24)
Calcium: 8.5 mg/dL — ABNORMAL LOW (ref 8.9–10.3)
GFR calc Af Amer: >60 mL/min (ref >60)
GFR calc non Af Amer: >60 mL/min (ref >60)
Glucose, Bld: 231 mg/dL — ABNORMAL HIGH (ref 65–99)
Potassium: 3.7 mmol/L (ref 3.5–5.1)
Sodium: 133 mmol/L — ABNORMAL LOW (ref 135–145)

## 2017-10-14 LAB — GLUCOSE, CAPILLARY
GLUCOSE-CAPILLARY: 219 mg/dL — AB (ref 65–99)
GLUCOSE-CAPILLARY: 317 mg/dL — AB (ref 65–99)
Glucose-Capillary: 307 mg/dL — ABNORMAL HIGH (ref 65–99)
Glucose-Capillary: 158 mg/dL — ABNORMAL HIGH (ref 65–99)

## 2017-10-14 MED ORDER — VANCOMYCIN HCL IN DEXTROSE 750-5 MG/150ML-% IV SOLN
750.0000 mg | Freq: Two times a day (BID) | INTRAVENOUS | Status: DC
Start: 2017-10-14 — End: 2017-10-18
  Administered 2017-10-14 – 2017-10-18 (×7): 750 mg via INTRAVENOUS
  Filled 2017-10-14 (×9): qty 150

## 2017-10-14 MED ORDER — INSULIN ASPART 100 UNIT/ML ~~LOC~~ SOLN
3.0000 [IU] | Freq: Three times a day (TID) | SUBCUTANEOUS | Status: DC
  Administered 2017-10-14 – 2017-10-20 (×16): 3 [IU] via SUBCUTANEOUS

## 2017-10-14 NOTE — H&P (View-Only) (Signed)
Patient ID: Andrew [name], male   DOB: August 28, 1945, 72 y.o.   MRN: [id number] Patient will be scheduled for an above-the-knee amputation on Saturday morning we will call his daughter from our office to ensure she knows that patient will need discharge to skilled nursing.

## 2017-10-14 NOTE — Progress Notes (Signed)
Pharmacy Antibiotic Note  Andrew [name] is a 72 y.o. male admitted on 10/10/2017 with cellulitis.  Pharmacy has been consulted for vancomycin dosing. Pt failed outpatient Keflex.   2/28: Vancomycin trough therapeutic at 13  Plan: -Continue Vancomycin 750 mg IV Q 12 hours. Vanc trough goal 10-15 mcg/mL -Monitor CBC, renal fx, cultures and clinical progress -VT prn  Height: 5\' 7"  (170.2 cm) Weight: 160 lb 1 oz (72.6 kg) IBW/kg (Calculated) : 66.1  Temp (24hrs), Avg:98.8 F (37.1 C), Min:98.4 F (36.9 C), Max:99.3 F (37.4 C)  Recent Labs  Lab 10/10/17 1451 10/10/17 1517 10/10/17 1646 10/10/17 1924 10/11/17 0538 10/12/17 0743 10/13/17 0512 10/14/17 0422 10/14/17 2005  WBC 34.6*  --   --   --  25.8* 28.3* 26.9* 24.8*  --   CREATININE 0.79  --   --   --  0.60* 0.52* 0.49* 0.54*  --   LATICACIDVEN  --  2.11* 2.05* 1.3  --   --   --   --   --   VANCOTROUGH  --   --   --   --   --   --   --   --  13*    Estimated Creatinine Clearance: 78 mL/min (A) (by C-G formula based on SCr of 0.54 mg/dL (L)).    Allergies  Allergen Reactions  . Penicillins Rash    Tolerated Zosyn Jan 2018  Possibly rash? Has patient had a PCN reaction causing immediate rash, facial/tongue/throat swelling, SOB or lightheadedness with hypotension: YES Has patient had a PCN reaction causing severe rash involving mucus membranes or skin necrosis:NO Has patient had a PCN reaction that required hospitalization NO Has patient had a PCN reaction occurring within the last 10 years:NO If all of the above answers are "NO", then may proceed with Cephal    Antimicrobials this admission: Vancomycin 2/24> Cefepime 2/24> Flagyl 2/24>    Dose adjustments this admission: None   Microbiology results: 2/24 BC x 2 >> NGTD MRSA PCR +  Thank you for allowing [place] to participate in this patients care.  Jens Som, PharmD If after 10:30p, please call main pharmacy at: [phone] 10/14/2017 10:40 PM

## 2017-10-14 NOTE — Progress Notes (Signed)
Patient ID: Andrew [name], male   DOB: 01/16/1946, 72 y.o.   MRN: [id number] Patient will be scheduled for an above-the-knee amputation on Saturday morning we will call his daughter from our office to ensure she knows that patient will need discharge to skilled nursing.

## 2017-10-14 NOTE — Progress Notes (Signed)
PROGRESS NOTE    Andrew [name]  YPP:[id number] DOB: 1945/11/28 DOA: 10/10/2017 PCP: Christain Sacramento, MD    Brief Narrative: Andrew Lines Parrishis a 72 y.o.malewith medical history significant forType 2 DM, HTN, HLD, R eye blindness, tobacco abuse presents to the ED c/o worsening RLE cellulitis. Pt reported he sustained a wound to the foot about 3 weeks ago, that gradually spread to his lower leg. Pt reported his PCP prescribed PO keflex for 10 days of which he was compliant to, but didn't help with the infection. Pt denies any fever/chills, nausea/vomiting, chest pain, SOB, abdominal pain. Of note, pt has had recurrent cellulitis with very poor wound healing. Last R arm infection, required a skin graft. In the ED, noted was a very foul smelling/wet gangrene/eschar on the ant lower leg to dorsum of foot. Surrounding erythema noted. Pt was noted to be septic and admitted for further management.   Assessment & Plan:   Principal Problem:   Cellulitis Active Problems:   Hyperlipidemia   Tobacco abuse disorder   Essential hypertension   Diabetic autonomic neuropathy associated with type 2 diabetes mellitus (HCC)   Legal blindness   Atherosclerosis of native arteries of extremities with gangrene, right leg (HCC)   Goals of care, counseling/discussion   Palliative care encounter   Malnutrition of moderate degree  Sepsis due to right leg cellulitis complicated by gangrene/eschar/necrosis Elevated WBC 30s, elevated LA on admission ESR 92,  CRP 13.5, pre-albumin <5 ABI pending.  Plastic surgery consulted, rec amputation, not a good candidate for Acell/VAC treatment Dr Sharol Given recommend Amputation. Plan for Saturday./  Continue with IV vancomycin, cefepime.  WBC trending down.  Patient agree to have surgery, he does not wants rehab.   DM type 2;  Continue with lantus. 20 units.  SSI.  Will add meals coverage   Transaminases; improved. . Check ammonia level/   HLD; on Zocor.   HTN;  Continue with Norvasc, metoprolol.   Tobacco abuse Advised to quit Nicotine patch ordered  Hyponatremia; IV fluids.    DVT prophylaxis: SCD Code Status: DNR Family Communication: care discussed with patient  Disposition Plan: to be determine.    Consultants:   Dr Sharol Given.    Procedures:   ABI   Antimicrobials:  Vancomycin, cefepime, flagyl 2-25  Subjective: He is alert, denies worsening pain. He is refusing to go to SNF> appears confuse.    Objective: Vitals:   10/13/17 1634 10/13/17 2135 10/14/17 0619 10/14/17 0935  BP: (!) 152/84 105/89 (!) 149/50 (!) 140/57  Pulse: 76 81 79 84  Resp: 16 16 16 17   Temp: 98.5 F (36.9 C) 98.4 F (36.9 C) 98.4 F (36.9 C) 98.9 F (37.2 C)  TempSrc: Oral Oral Oral Oral  SpO2: 98% 94% 96% 95%  Weight:  72.6 kg (160 lb 1 oz)    Height:        Intake/Output Summary (Last 24 hours) at 10/14/2017 1359 Last data filed at 10/14/2017 0935 Gross per 24 hour  Intake 2610.34 ml  Output 1500 ml  Net 1110.34 ml   Filed Weights   10/10/17 2014 10/11/17 2046 10/13/17 2135  Weight: 72.6 kg (160 lb 0.9 oz) 72.6 kg (160 lb 1 oz) 72.6 kg (160 lb 1 oz)    Examination:  General exam: NAD Respiratory system: CTA Cardiovascular system: S 1, S 2 RRR Gastrointestinal system:BS present,  Central nervous system: Alert and oriented. No focal neurological deficits. Extremities: right lower extremity with dressing, necrosis.  Data Reviewed: I have personally reviewed following labs and imaging studies  CBC: Recent Labs  Lab 10/10/17 1451 10/11/17 0538 10/12/17 0743 10/13/17 0512 10/14/17 0422  WBC 34.6* 25.8* 28.3* 26.9* 24.8*  NEUTROABS 30.8* 21.9* 24.1* 22.8* 20.4*  HGB 11.3* 9.6* 9.9* 10.0* 9.9*  HCT 33.6* 29.1* 30.2* 30.1* 30.0*  MCV 83.2 84.8 84.6 84.8 83.6  PLT 670* 600* 636* 737* 115*   Basic Metabolic Panel: Recent Labs  Lab 10/10/17 1451 10/11/17 0538 10/12/17 0743 10/13/17 0512 10/14/17 0422  NA 129* 133*  134* 132* 133*  K 4.5 3.7 3.4* 3.5 3.7  CL 92* 99* 100* 98* 99*  CO2 24 23 22  21* 25  GLUCOSE 295* 137* 167* 159* 231*  BUN 14 12 9 7 9   CREATININE 0.79 0.60* 0.52* 0.49* 0.54*  CALCIUM 8.8* 8.2* 8.3* 8.3* 8.5*   GFR: Estimated Creatinine Clearance: 78 mL/min (A) (by C-G formula based on SCr of 0.54 mg/dL (L)). Liver Function Tests: Recent Labs  Lab 10/10/17 1451 10/14/17 0422  AST 50* 26  ALT 27 16*  ALKPHOS 178* 142*  BILITOT 0.5 0.3  PROT 6.6 5.3*  ALBUMIN 2.4* 1.9*   No results for input(s): LIPASE, AMYLASE in the last 168 hours. No results for input(s): AMMONIA in the last 168 hours. Coagulation Profile: No results for input(s): INR, PROTIME in the last 168 hours. Cardiac Enzymes: No results for input(s): CKTOTAL, CKMB, CKMBINDEX, TROPONINI in the last 168 hours. BNP (last 3 results) No results for input(s): PROBNP in the last 8760 hours. HbA1C: No results for input(s): HGBA1C in the last 72 hours. CBG: Recent Labs  Lab 10/13/17 1130 10/13/17 1640 10/13/17 2123 10/14/17 0746 10/14/17 1147  GLUCAP 140* 152* 241* 219* 307*   Lipid Profile: No results for input(s): CHOL, HDL, LDLCALC, TRIG, CHOLHDL, LDLDIRECT in the last 72 hours. Thyroid Function Tests: No results for input(s): TSH, T4TOTAL, FREET4, T3FREE, THYROIDAB in the last 72 hours. Anemia Panel: No results for input(s): VITAMINB12, FOLATE, FERRITIN, TIBC, IRON, RETICCTPCT in the last 72 hours. Sepsis Labs: Recent Labs  Lab 10/10/17 1517 10/10/17 1646 10/10/17 1924 10/11/17 0538 10/12/17 0743  PROCALCITON  --   --  0.16 0.16 0.22  LATICACIDVEN 2.11* 2.05* 1.3  --   --     Recent Results (from the past 240 hour(s))  Culture, blood (routine x 2)     Status: None (Preliminary result)   Collection Time: 10/10/17  4:20 PM  Result Value Ref Range Status   Specimen Description BLOOD RIGHT HAND  Final   Special Requests   Final    BOTTLES DRAWN AEROBIC AND ANAEROBIC Blood Culture adequate volume    Culture   Final    NO GROWTH 3 DAYS Performed at [facility], [place] [address], [place], [place] [address]    Report Status PENDING  Incomplete  Culture, blood (routine x 2)     Status: None (Preliminary result)   Collection Time: 10/10/17  4:35 PM  Result Value Ref Range Status   Specimen Description BLOOD RIGHT ANTECUBITAL  Final   Special Requests   Final    BOTTLES DRAWN AEROBIC AND ANAEROBIC Blood Culture adequate volume   Culture   Final    NO GROWTH 3 DAYS Performed at [facility], [place] [address], [place], [place] [address]    Report Status PENDING  Incomplete  MRSA PCR Screening     Status: Abnormal   Collection Time: 10/10/17  8:32 PM  Result Value  Ref Range Status   MRSA by PCR POSITIVE (A) NEGATIVE Final    Comment:        The GeneXpert MRSA Assay (FDA approved for NASAL specimens only), is one component of a comprehensive MRSA colonization surveillance program. It is not intended to diagnose MRSA infection nor to guide or monitor treatment for MRSA infections. RESULT CALLED TO, READ BACK BY AND VERIFIED WITH: MITZ,A RN 10/10/17 AT 2227 [facility]          Radiology Studies: No results found.      Scheduled Meds: . amLODipine  5 mg Oral Daily  . aspirin EC  325 mg Oral Daily  . atorvastatin  20 mg Oral q1800  . Chlorhexidine Gluconate Cloth  6 each Topical Q0600  . feeding supplement (ENSURE ENLIVE)  237 mL Oral Q24H  . feeding supplement (GLUCERNA SHAKE)  237 mL Oral BID BM  . gabapentin  100 mg Oral TID  . insulin aspart  0-5 Units Subcutaneous QHS  . insulin aspart  0-9 Units Subcutaneous TID WC  . insulin glargine  20 Units Subcutaneous Daily  . metoprolol succinate  50 mg Oral Daily  . metroNIDAZOLE  500 mg Oral Q8H  . multivitamin with minerals  1 tablet Oral Daily  . mupirocin ointment  1 application Nasal BID  . nicotine  21 mg Transdermal QHS  . sodium chloride flush  3 mL Intravenous Q12H  . thiamine  100 mg Oral Daily    Continuous Infusions: . sodium chloride Stopped (10/14/17 0438)  . ceFEPime (MAXIPIME) IV 2 g (10/14/17 1123)  . vancomycin       LOS: 4 days    Time spent: 35 minutes.,     Elmarie Shiley, MD [facility] Pager [phone]  If 7PM-7AM, please contact night-coverage [email or web address] Password [facility] 10/14/2017, 1:59 PM

## 2017-10-14 NOTE — Progress Notes (Signed)
Inpatient Diabetes Program Recommendations  AACE/ADA: New Consensus Statement on Inpatient Glycemic Control (2015)  Target Ranges:  Prepandial:   less than 140 mg/dL      Peak postprandial:   less than 180 mg/dL (1-2 hours)      Critically ill patients:  140 - 180 mg/dL   Lab Results  Component Value Date   GLUCAP 219 (H) 10/14/2017   HGBA1C 9.8 (H) 10/11/2017    Review of Glycemic Control Results for Andrew [name], Andrew [name] (MRN [id number]) as of 10/14/2017 08:45  Ref. Range 10/13/2017 11:30 10/13/2017 16:40 10/13/2017 21:23 10/14/2017 07:46  Glucose-Capillary Latest Ref Range: 65 - 99 mg/dL 140 (H) 152 (H) 241 (H) 219 (H)   Diabetes history: Type 2 DM Outpatient Diabetes medications: Toujeo 60 units daily, Januvia 100 mg daily, Metformin 1000 mg bid Current orders for Inpatient glycemic control:  Lantus 15 units daily, Novolog sensitive tid with meals and HS    Inpatient Diabetes Program Recommendations:    Please add meal coverage, Novolog 3 Units TIDAC (hold if patient eats less than 50%).   Also of note, patient did not receive Lantus 20 Units QD on 10/13/17, it was held for "possible surgery", thus contributing to increased BS this AM. RN does not need to hold basal insulins when NPO.   Thanks, Bronson Curb, MSN, RNC-OB Diabetes Coordinator [phone] (8a-5p)

## 2017-10-14 NOTE — Progress Notes (Signed)
Palliative:  Mr. Andrew [name] is more lethargic today when I see him. He is also a little more confused and asking "how do you spell exit" (as he is looking at the exit sign outside his room). Asking other questions that did not make sense to me.   I attempted to rediscuss with patient that he will require placement after his surgery because we do not want to put him through surgery just for him to decline at home and return in worse shape then he is now. He just grunted. I did explain that rehab would not really be an option and that it goes along with the surgery. I do not believe he understands me today as he is more confused. Also at his best he requires very simple and straight forward answers and explanations. I have explained the need for SNF post-op with daughter yesterday but she says that her father "does what he wants and doesn't listen to me."   Please contact Ms. Andrew [name] with surgery time and with update post-op Andrew [name] [phone]). Daughter, Andrew [name], is very difficult to reach but says that she can best be reached via text during the weekdays [phone]).   15 min  Vinie Sill, NP Palliative Medicine Team Pager # [phone] (M-F 8a-5p) Team Phone # [phone] (Nights/Weekends)

## 2017-10-15 ENCOUNTER — Other Ambulatory Visit: Payer: Self-pay

## 2017-10-15 LAB — BASIC METABOLIC PANEL
ANION GAP: 8 (ref 5–15)
Anion gap: 8 (ref 5–15)
BUN: 9 mg/dL (ref 6–20)
BUN: 13 mg/dL (ref 6–20)
CHLORIDE: 99 mmol/L — AB (ref 101–111)
CHLORIDE: 97 mmol/L — AB (ref 101–111)
CO2: 26 mmol/L (ref 22–32)
CO2: 25 mmol/L (ref 22–32)
CREATININE: 0.55 mg/dL — AB (ref 0.61–1.24)
Calcium: 8.5 mg/dL — ABNORMAL LOW (ref 8.9–10.3)
Calcium: 8.2 mg/dL — ABNORMAL LOW (ref 8.9–10.3)
Creatinine, Ser: 0.61 mg/dL (ref 0.61–1.24)
GFR calc Af Amer: >60 mL/min (ref >60)
GFR calc non Af Amer: >60 mL/min (ref >60)
GFR calc non Af Amer: >60 mL/min (ref >60)
GLUCOSE: 339 mg/dL — AB (ref 65–99)
Glucose, Bld: 158 mg/dL — ABNORMAL HIGH (ref 65–99)
POTASSIUM: 3.8 mmol/L (ref 3.5–5.1)
Potassium: 4.2 mmol/L (ref 3.5–5.1)
SODIUM: 133 mmol/L — AB (ref 135–145)
Sodium: 130 mmol/L — ABNORMAL LOW (ref 135–145)

## 2017-10-15 LAB — CBC WITH DIFFERENTIAL/PLATELET
Basophils Absolute: 0 10*3/uL (ref 0.0–0.1)
Basophils Relative: 0 %
EOS PCT: 1 %
Eosinophils Absolute: 0.1 10*3/uL (ref 0.0–0.7)
HCT: 28.9 % — ABNORMAL LOW (ref 39.0–52.0)
HEMOGLOBIN: 9.3 g/dL — AB (ref 13.0–17.0)
LYMPHS ABS: 2.2 10*3/uL (ref 0.7–4.0)
LYMPHS PCT: 10 %
MCH: 27 pg (ref 26.0–34.0)
MCHC: 32.2 g/dL (ref 30.0–36.0)
MCV: 83.8 fL (ref 78.0–100.0)
MONOS PCT: 11 %
Monocytes Absolute: 2.2 10*3/uL — ABNORMAL HIGH (ref 0.1–1.0)
Neutro Abs: 16.3 10*3/uL — ABNORMAL HIGH (ref 1.7–7.7)
Neutrophils Relative %: 78 %
Platelets: 764 10*3/uL — ABNORMAL HIGH (ref 150–400)
RBC: 3.45 MIL/uL — ABNORMAL LOW (ref 4.22–5.81)
RDW: 15.5 % (ref 11.5–15.5)
WBC: 20.9 10*3/uL — ABNORMAL HIGH (ref 4.0–10.5)

## 2017-10-15 LAB — CULTURE, BLOOD (ROUTINE X 2)
CULTURE: NO GROWTH
Culture: NO GROWTH
SPECIAL REQUESTS: ADEQUATE
Special Requests: ADEQUATE

## 2017-10-15 LAB — GLUCOSE, CAPILLARY
GLUCOSE-CAPILLARY: 236 mg/dL — AB (ref 65–99)
Glucose-Capillary: 150 mg/dL — ABNORMAL HIGH (ref 65–99)
Glucose-Capillary: 237 mg/dL — ABNORMAL HIGH (ref 65–99)
Glucose-Capillary: 337 mg/dL — ABNORMAL HIGH (ref 65–99)

## 2017-10-15 MED ORDER — SENNOSIDES-DOCUSATE SODIUM 8.6-50 MG PO TABS
1.0000 | ORAL_TABLET | Freq: Two times a day (BID) | ORAL | Status: DC
  Administered 2017-10-15 – 2017-10-21 (×12): 1 via ORAL
  Filled 2017-10-15 (×11): qty 1

## 2017-10-15 MED ORDER — POVIDONE-IODINE 10 % EX SWAB
2.0000 "application " | Freq: Once | CUTANEOUS | Status: AC
  Administered 2017-10-16: 2 via TOPICAL

## 2017-10-15 MED ORDER — POLYETHYLENE GLYCOL 3350 17 G PO PACK
17.0000 g | PACK | Freq: Every day | ORAL | Status: DC
  Administered 2017-10-15 – 2017-10-21 (×7): 17 g via ORAL
  Filled 2017-10-15 (×6): qty 1

## 2017-10-15 MED ORDER — CLINDAMYCIN PHOSPHATE 900 MG/50ML IV SOLN
900.0000 mg | INTRAVENOUS | Status: AC
  Administered 2017-10-16: 900 mg via INTRAVENOUS
  Filled 2017-10-15: qty 50

## 2017-10-15 MED ORDER — CHLORHEXIDINE GLUCONATE 4 % EX LIQD
60.0000 mL | Freq: Once | CUTANEOUS | Status: AC
  Administered 2017-10-16: 4 via TOPICAL
  Filled 2017-10-15: qty 60

## 2017-10-15 NOTE — Plan of Care (Signed)
  Progressing Health Behavior/Discharge Planning: Ability to manage health-related needs will improve 10/15/2017 1538 - Progressing by Evalee Jefferson, RN Clinical Measurements: Ability to maintain clinical measurements within normal limits will improve 10/15/2017 1538 - Progressing by Evalee Jefferson, RN Will remain free from infection 10/15/2017 1538 - Progressing by Evalee Jefferson, RN Diagnostic test results will improve 10/15/2017 1538 - Progressing by Evalee Jefferson, RN Respiratory complications will improve 10/15/2017 1538 - Progressing by Evalee Jefferson, RN Cardiovascular complication will be avoided 10/15/2017 1538 - Progressing by Evalee Jefferson, RN Coping: Level of anxiety will decrease 10/15/2017 1538 - Progressing by Evalee Jefferson, RN Elimination: Will not experience complications related to bowel motility 10/15/2017 1538 - Progressing by Evalee Jefferson, RN Will not experience complications related to urinary retention 10/15/2017 1538 - Progressing by Evalee Jefferson, RN Skin Integrity: Risk for impaired skin integrity will decrease 10/15/2017 1538 - Progressing by Evalee Jefferson, RN

## 2017-10-15 NOTE — Progress Notes (Signed)
PROGRESS NOTE    Andrew [name]  GQB:[id number] DOB: [birth year] DOA: 10/10/2017 PCP: Andrew Sacramento, MD    Brief Narrative: Andrew Lines Parrishis a 72 y.o.malewith medical history significant forType 2 DM, HTN, HLD, R eye blindness, tobacco abuse presents to the ED c/o worsening RLE cellulitis. Pt reported he sustained a wound to the foot about 3 weeks ago, that gradually spread to his lower leg. Pt reported his PCP prescribed PO keflex for 10 days of which he was compliant to, but didn't help with the infection. Pt denies any fever/chills, nausea/vomiting, chest pain, SOB, abdominal pain. Of note, pt has had recurrent cellulitis with very poor wound healing. Last R arm infection, required a skin graft. In the ED, noted was a very foul smelling/wet gangrene/eschar on the ant lower leg to dorsum of foot. Surrounding erythema noted. Pt was noted to be septic and admitted for further management.   Assessment & Plan:   Principal Problem:   Cellulitis Active Problems:   Hyperlipidemia   Tobacco abuse disorder   Essential hypertension   Diabetic autonomic neuropathy associated with type 2 diabetes mellitus (HCC)   Legal blindness   Atherosclerosis of native arteries of extremities with gangrene, right leg (HCC)   Goals of care, counseling/discussion   Palliative care encounter   Malnutrition of moderate degree  Sepsis due to right leg cellulitis complicated by gangrene/eschar/necrosis Elevated WBC 30s, elevated LA on admission ESR 92,  CRP 13.5, pre-albumin <5 ABI right and left 0.9---0.9 Plastic surgery consulted, rec amputation, not a good candidate for Acell/VAC treatment Dr Sharol Given recommend Amputation. Plan for Saturday. Continue with IV vancomycin, cefepime.  WBC trending down. 26--20 Patient agree to have surgery, he does not wants rehab.   DM type 2;  Continue with lantus. 20 units.  SSI.  Added meals coverage.   Transaminases; improved. Ammonia not significant elevated  36.  HLD; on Zocor.   HTN; Continue with Norvasc, metoprolol.   Tobacco abuse Advised to quit Nicotine patch ordered  Hyponatremia; IV fluids.    DVT prophylaxis: SCD Code Status: DNR Family Communication: care discussed with patient  Disposition Plan: to be determine.    Consultants:   Dr Sharol Given.    Procedures:   ABI   Antimicrobials:  Vancomycin, cefepime, flagyl 2-25  Subjective: He report mild abdominal pain, no BM for last couples of day.   Objective: Vitals:   10/14/17 1640 10/14/17 2057 10/15/17 0417 10/15/17 0949  BP: (!) 115/44 (!) 135/52 (!) 140/52 (!) 141/73  Pulse: 79 81 74 78  Resp: 17 16 15 16   Temp: 98.7 F (37.1 C) 99.3 F (37.4 C) 99.1 F (37.3 C) 98.8 F (37.1 C)  TempSrc: Oral Oral Oral Oral  SpO2: 95% 96% 98% 98%  Weight:      Height:        Intake/Output Summary (Last 24 hours) at 10/15/2017 1424 Last data filed at 10/15/2017 0900 Gross per 24 hour  Intake 1940 ml  Output 400 ml  Net 1540 ml   Filed Weights   10/10/17 2014 10/11/17 2046 10/13/17 2135  Weight: 72.6 kg (160 lb 0.9 oz) 72.6 kg (160 lb 1 oz) 72.6 kg (160 lb 1 oz)    Examination:  General exam: NAD Respiratory system: CTA Cardiovascular system: S 1. S 2 RRR Gastrointestinal system: BS present, soft, nt Central nervous system: alert.  Extremities: Right lower extremity with dressing, Necrosis of skin.      Data Reviewed: I have personally reviewed following labs  and imaging studies  CBC: Recent Labs  Lab 10/11/17 0538 10/12/17 0743 10/13/17 0512 10/14/17 0422 10/15/17 0437  WBC 25.8* 28.3* 26.9* 24.8* 20.9*  NEUTROABS 21.9* 24.1* 22.8* 20.4* 16.3*  HGB 9.6* 9.9* 10.0* 9.9* 9.3*  HCT 29.1* 30.2* 30.1* 30.0* 28.9*  MCV 84.8 84.6 84.8 83.6 83.8  PLT 600* 636* 737* 752* 355*   Basic Metabolic Panel: Recent Labs  Lab 10/11/17 0538 10/12/17 0743 10/13/17 0512 10/14/17 0422 10/15/17 0437  NA 133* 134* 132* 133* 133*  K 3.7 3.4* 3.5 3.7 3.8  CL  99* 100* 98* 99* 99*  CO2 23 22 21* 25 26  GLUCOSE 137* 167* 159* 231* 158*  BUN 12 9 7 9 9   CREATININE 0.60* 0.52* 0.49* 0.54* 0.55*  CALCIUM 8.2* 8.3* 8.3* 8.5* 8.5*   GFR: Estimated Creatinine Clearance: 78 mL/min (A) (by C-G formula based on SCr of 0.55 mg/dL (L)). Liver Function Tests: Recent Labs  Lab 10/10/17 1451 10/14/17 0422  AST 50* 26  ALT 27 16*  ALKPHOS 178* 142*  BILITOT 0.5 0.3  PROT 6.6 5.3*  ALBUMIN 2.4* 1.9*   No results for input(s): LIPASE, AMYLASE in the last 168 hours. Recent Labs  Lab 10/14/17 1415  AMMONIA 36*   Coagulation Profile: No results for input(s): INR, PROTIME in the last 168 hours. Cardiac Enzymes: No results for input(s): CKTOTAL, CKMB, CKMBINDEX, TROPONINI in the last 168 hours. BNP (last 3 results) No results for input(s): PROBNP in the last 8760 hours. HbA1C: No results for input(s): HGBA1C in the last 72 hours. CBG: Recent Labs  Lab 10/14/17 1147 10/14/17 1639 10/14/17 2057 10/15/17 0747 10/15/17 1137  GLUCAP 307* 317* 158* 150* 237*   Lipid Profile: No results for input(s): CHOL, HDL, LDLCALC, TRIG, CHOLHDL, LDLDIRECT in the last 72 hours. Thyroid Function Tests: No results for input(s): TSH, T4TOTAL, FREET4, T3FREE, THYROIDAB in the last 72 hours. Anemia Panel: No results for input(s): VITAMINB12, FOLATE, FERRITIN, TIBC, IRON, RETICCTPCT in the last 72 hours. Sepsis Labs: Recent Labs  Lab 10/10/17 1517 10/10/17 1646 10/10/17 1924 10/11/17 0538 10/12/17 0743  PROCALCITON  --   --  0.16 0.16 0.22  LATICACIDVEN 2.11* 2.05* 1.3  --   --     Recent Results (from the past 240 hour(s))  Culture, blood (routine x 2)     Status: None   Collection Time: 10/10/17  4:20 PM  Result Value Ref Range Status   Specimen Description BLOOD RIGHT HAND  Final   Special Requests   Final    BOTTLES DRAWN AEROBIC AND ANAEROBIC Blood Culture adequate volume   Culture   Final    NO GROWTH 5 DAYS Performed at [facility], [place] [address] [place], [place] [address]    Report Status 10/15/2017 FINAL  Final  Culture, blood (routine x 2)     Status: None   Collection Time: 10/10/17  4:35 PM  Result Value Ref Range Status   Specimen Description BLOOD RIGHT ANTECUBITAL  Final   Special Requests   Final    BOTTLES DRAWN AEROBIC AND ANAEROBIC Blood Culture adequate volume   Culture   Final    NO GROWTH 5 DAYS Performed at [facility], [place] [address], [place], [place] [address]    Report Status 10/15/2017 FINAL  Final  MRSA PCR Screening     Status: Abnormal   Collection Time: 10/10/17  8:32 PM  Result Value Ref Range Status   MRSA by PCR POSITIVE (  A) NEGATIVE Final    Comment:        The GeneXpert MRSA Assay (FDA approved for NASAL specimens only), is one component of a comprehensive MRSA colonization surveillance program. It is not intended to diagnose MRSA infection nor to guide or monitor treatment for MRSA infections. RESULT CALLED TO, READ BACK BY AND VERIFIED WITH: MITZ,A RN 10/10/17 AT 2227 [facility]          Radiology Studies: No results found.      Scheduled Meds: . amLODipine  5 mg Oral Daily  . aspirin EC  325 mg Oral Daily  . atorvastatin  20 mg Oral q1800  . Chlorhexidine Gluconate Cloth  6 each Topical Q0600  . feeding supplement (ENSURE ENLIVE)  237 mL Oral Q24H  . feeding supplement (GLUCERNA SHAKE)  237 mL Oral BID BM  . gabapentin  100 mg Oral TID  . insulin aspart  0-5 Units Subcutaneous QHS  . insulin aspart  0-9 Units Subcutaneous TID WC  . insulin aspart  3 Units Subcutaneous TID WC  . insulin glargine  20 Units Subcutaneous Daily  . metoprolol succinate  50 mg Oral Daily  . metroNIDAZOLE  500 mg Oral Q8H  . multivitamin with minerals  1 tablet Oral Daily  . mupirocin ointment  1 application Nasal BID  . nicotine  21 mg Transdermal QHS  . polyethylene glycol  17 g Oral Daily  . senna-docusate  1 tablet Oral BID  . sodium chloride flush  3 mL Intravenous  Q12H  . thiamine  100 mg Oral Daily   Continuous Infusions: . sodium chloride Stopped (10/14/17 0438)  . ceFEPime (MAXIPIME) IV Stopped (10/15/17 1231)  . [START ON 10/16/2017] clindamycin (CLEOCIN) IV    . vancomycin Stopped (10/15/17 1035)     LOS: 5 days    Time spent: 35 minutes.,     Elmarie Shiley, MD [facility] Pager [phone]  If 7PM-7AM, please contact night-coverage [email or web address] Password [facility] 10/15/2017, 2:24 PM

## 2017-10-15 NOTE — Progress Notes (Signed)
Inpatient Diabetes Program Recommendations  AACE/ADA: New Consensus Statement on Inpatient Glycemic Control (2015)  Target Ranges:  Prepandial:   less than 140 mg/dL      Peak postprandial:   less than 180 mg/dL (1-2 hours)      Critically ill patients:  140 - 180 mg/dL   Lab Results  Component Value Date   GLUCAP 237 (H) 10/15/2017   HGBA1C 9.8 (H) 10/11/2017    Review of Glycemic Control Results for [name], [name] (MRN [id number]) as of 10/15/2017 14:45  Ref. Range 10/14/2017 16:39 10/14/2017 20:57 10/15/2017 07:47 10/15/2017 11:37  Glucose-Capillary Latest Ref Range: 65 - 99 mg/dL 317 (H) 158 (H) 150 (H) 237 (H)   Diabetes history:Type 2 DM Outpatient Diabetes medications:Toujeo 60 units daily, Januvia 100 mg daily, Metformin 1000 mg bid Current orders for Inpatient glycemic control: Lantus 15 units daily, Novolog sensitive tid with meals and HS, Novolog 3 Units TID    Inpatient Diabetes Program Recommendations:     Attempted to speak with patient. Patient confirms he was giving an injection of insulin, but could not confirm which insulin or dosage. Question sending patient home. He is not appropriate for education related to diabetes management. Patient seemed confused and not all sentences made sense.   Awaiting surgery tomorrow. Noted that provider added meal coverage. No recs today will continue to watch.   Thanks, Bronson Curb, MSN, RNC-OB Diabetes Coordinator [phone] (8a-5p)

## 2017-10-16 ENCOUNTER — Inpatient Hospital Stay (HOSPITAL_COMMUNITY): Payer: Medicare Other | Admitting: Certified Registered Nurse Anesthetist

## 2017-10-16 ENCOUNTER — Encounter (HOSPITAL_COMMUNITY)
Admission: EM | Disposition: A | Discharge status: Skilled Nursing Facility | Payer: Self-pay | Source: Home / Self Care | Attending: Internal Medicine

## 2017-10-16 ENCOUNTER — Inpatient Hospital Stay (HOSPITAL_COMMUNITY): Payer: Medicare Other

## 2017-10-16 DIAGNOSIS — I70261 Atherosclerosis of native arteries of extremities with gangrene, right leg: Secondary | ICD-10-CM

## 2017-10-16 DIAGNOSIS — E1143 Type 2 diabetes mellitus with diabetic autonomic (poly)neuropathy: Secondary | ICD-10-CM

## 2017-10-16 DIAGNOSIS — L03115 Cellulitis of right lower limb: Secondary | ICD-10-CM

## 2017-10-16 HISTORY — PX: AMPUTATION: SHX166

## 2017-10-16 LAB — CBC
HCT: 25.6 % — ABNORMAL LOW (ref 39.0–52.0)
Hemoglobin: 8.2 g/dL — ABNORMAL LOW (ref 13.0–17.0)
MCH: 27 pg (ref 26.0–34.0)
MCHC: 32 g/dL (ref 30.0–36.0)
MCV: 84.2 fL (ref 78.0–100.0)
PLATELETS: 700 10*3/uL — AB (ref 150–400)
RBC: 3.04 MIL/uL — AB (ref 4.22–5.81)
RDW: 15.7 % — AB (ref 11.5–15.5)
WBC: 19.6 10*3/uL — ABNORMAL HIGH (ref 4.0–10.5)

## 2017-10-16 LAB — GLUCOSE, CAPILLARY
GLUCOSE-CAPILLARY: 265 mg/dL — AB (ref 65–99)
GLUCOSE-CAPILLARY: 155 mg/dL — AB (ref 65–99)
Glucose-Capillary: 272 mg/dL — ABNORMAL HIGH (ref 65–99)
Glucose-Capillary: 207 mg/dL — ABNORMAL HIGH (ref 65–99)
Glucose-Capillary: 181 mg/dL — ABNORMAL HIGH (ref 65–99)

## 2017-10-16 LAB — CBC WITH DIFFERENTIAL/PLATELET
BASOS ABS: 0 10*3/uL (ref 0.0–0.1)
Basophils Relative: 0 %
EOS ABS: 0.2 10*3/uL (ref 0.0–0.7)
Eosinophils Relative: 1 %
HEMATOCRIT: 29 % — AB (ref 39.0–52.0)
HEMOGLOBIN: 9.5 g/dL — AB (ref 13.0–17.0)
LYMPHS PCT: 8 %
Lymphs Abs: 1.8 10*3/uL (ref 0.7–4.0)
MCH: 27.5 pg (ref 26.0–34.0)
MCHC: 32.8 g/dL (ref 30.0–36.0)
MCV: 84.1 fL (ref 78.0–100.0)
MONOS PCT: 9 %
Monocytes Absolute: 2 10*3/uL — ABNORMAL HIGH (ref 0.1–1.0)
NEUTROS ABS: 18.2 10*3/uL — AB (ref 1.7–7.7)
NEUTROS PCT: 82 %
Platelets: 717 10*3/uL — ABNORMAL HIGH (ref 150–400)
RBC: 3.45 MIL/uL — AB (ref 4.22–5.81)
RDW: 15.7 % — ABNORMAL HIGH (ref 11.5–15.5)
WBC: 22.2 10*3/uL — ABNORMAL HIGH (ref 4.0–10.5)

## 2017-10-16 SURGERY — AMPUTATION, ABOVE KNEE
Anesthesia: General | Site: Leg Lower | Laterality: Right

## 2017-10-16 MED ORDER — HYDROCODONE-ACETAMINOPHEN 7.5-325 MG PO TABS
1.0000 | ORAL_TABLET | ORAL | Status: DC | PRN
  Administered 2017-10-17: 1 via ORAL
  Administered 2017-10-18 – 2017-10-21 (×4): 2 via ORAL
  Filled 2017-10-16: qty 2
  Filled 2017-10-16: qty 1
  Filled 2017-10-16 (×3): qty 2

## 2017-10-16 MED ORDER — INSULIN ASPART 100 UNIT/ML ~~LOC~~ SOLN
SUBCUTANEOUS | Status: AC
  Filled 2017-10-16: qty 1

## 2017-10-16 MED ORDER — POLYETHYLENE GLYCOL 3350 17 G PO PACK
17.0000 g | PACK | Freq: Every day | ORAL | Status: DC | PRN
Start: 2017-10-16 — End: 2017-10-21

## 2017-10-16 MED ORDER — METHOCARBAMOL 500 MG PO TABS
500.0000 mg | ORAL_TABLET | Freq: Four times a day (QID) | ORAL | Status: DC | PRN
  Administered 2017-10-21 (×2): 500 mg via ORAL
  Filled 2017-10-16 (×2): qty 1

## 2017-10-16 MED ORDER — ACETAMINOPHEN 325 MG PO TABS
325.0000 mg | ORAL_TABLET | Freq: Four times a day (QID) | ORAL | Status: DC | PRN
  Filled 2017-10-16: qty 2

## 2017-10-16 MED ORDER — LACTATED RINGERS IV SOLN
INTRAVENOUS | Status: DC | PRN
  Administered 2017-10-16: 07:00:00 via INTRAVENOUS

## 2017-10-16 MED ORDER — METHOCARBAMOL 1000 MG/10ML IJ SOLN
500.0000 mg | Freq: Four times a day (QID) | INTRAMUSCULAR | Status: DC | PRN
  Filled 2017-10-16: qty 5

## 2017-10-16 MED ORDER — MIDAZOLAM HCL 2 MG/2ML IJ SOLN
INTRAMUSCULAR | Status: AC
  Filled 2017-10-16: qty 2

## 2017-10-16 MED ORDER — DOCUSATE SODIUM 100 MG PO CAPS
100.0000 mg | ORAL_CAPSULE | Freq: Two times a day (BID) | ORAL | Status: DC
  Administered 2017-10-16 – 2017-10-21 (×11): 100 mg via ORAL
  Filled 2017-10-16 (×11): qty 1

## 2017-10-16 MED ORDER — 0.9 % SODIUM CHLORIDE (POUR BTL) OPTIME
TOPICAL | Status: DC | PRN
  Administered 2017-10-16: 1000 mL

## 2017-10-16 MED ORDER — PHENYLEPHRINE HCL 10 MG/ML IJ SOLN
INTRAMUSCULAR | Status: DC | PRN
  Administered 2017-10-16: 80 ug via INTRAVENOUS

## 2017-10-16 MED ORDER — FENTANYL CITRATE (PF) 100 MCG/2ML IJ SOLN: 25.0000 ug | INTRAMUSCULAR | Status: DC | PRN

## 2017-10-16 MED ORDER — OXYCODONE HCL 5 MG PO TABS: 5.0000 mg | ORAL_TABLET | Freq: Once | ORAL | Status: DC | PRN

## 2017-10-16 MED ORDER — METOCLOPRAMIDE HCL 5 MG PO TABS: 5.0000 mg | ORAL_TABLET | Freq: Three times a day (TID) | ORAL | Status: DC | PRN

## 2017-10-16 MED ORDER — ACETAMINOPHEN 325 MG PO TABS: 325.0000 mg | ORAL_TABLET | ORAL | Status: DC | PRN

## 2017-10-16 MED ORDER — BISACODYL 10 MG RE SUPP: 10.0000 mg | Freq: Every day | RECTAL | Status: DC | PRN

## 2017-10-16 MED ORDER — ONDANSETRON HCL 4 MG/2ML IJ SOLN
INTRAMUSCULAR | Status: DC | PRN
  Administered 2017-10-16: 4 mg via INTRAVENOUS

## 2017-10-16 MED ORDER — FENTANYL CITRATE (PF) 250 MCG/5ML IJ SOLN
INTRAMUSCULAR | Status: DC | PRN
  Administered 2017-10-16: 50 ug via INTRAVENOUS
  Administered 2017-10-16 (×4): 25 ug via INTRAVENOUS

## 2017-10-16 MED ORDER — SODIUM CHLORIDE 0.9 % IV SOLN
INTRAVENOUS | Status: DC
  Administered 2017-10-16 – 2017-10-17 (×2): via INTRAVENOUS

## 2017-10-16 MED ORDER — ONDANSETRON HCL 4 MG/2ML IJ SOLN: 4.0000 mg | Freq: Four times a day (QID) | INTRAMUSCULAR | Status: DC | PRN

## 2017-10-16 MED ORDER — ONDANSETRON HCL 4 MG PO TABS: 4.0000 mg | ORAL_TABLET | Freq: Four times a day (QID) | ORAL | Status: DC | PRN

## 2017-10-16 MED ORDER — ACETAMINOPHEN 160 MG/5ML PO SOLN: 325.0000 mg | ORAL | Status: DC | PRN

## 2017-10-16 MED ORDER — MORPHINE SULFATE (PF) 2 MG/ML IV SOLN
0.5000 mg | INTRAVENOUS | Status: DC | PRN
  Administered 2017-10-18: 1 mg via INTRAVENOUS
  Administered 2017-10-19: 2 mg via INTRAVENOUS
  Administered 2017-10-21 (×2): 1 mg via INTRAVENOUS
  Filled 2017-10-16 (×4): qty 1

## 2017-10-16 MED ORDER — OXYCODONE HCL 5 MG/5ML PO SOLN: 5.0000 mg | Freq: Once | ORAL | Status: DC | PRN

## 2017-10-16 MED ORDER — PROPOFOL 10 MG/ML IV BOLUS
INTRAVENOUS | Status: AC
  Filled 2017-10-16: qty 20

## 2017-10-16 MED ORDER — LIDOCAINE 2% (20 MG/ML) 5 ML SYRINGE
INTRAMUSCULAR | Status: DC | PRN
  Administered 2017-10-16: 40 mg via INTRAVENOUS

## 2017-10-16 MED ORDER — MAGNESIUM CITRATE PO SOLN: 1.0000 | Freq: Once | ORAL | Status: DC | PRN

## 2017-10-16 MED ORDER — OXYCODONE HCL 5 MG PO TABS
5.0000 mg | ORAL_TABLET | Freq: Once | ORAL | Status: DC | PRN
Start: 2017-10-16 — End: 2017-10-16

## 2017-10-16 MED ORDER — METOCLOPRAMIDE HCL 5 MG/ML IJ SOLN: 5.0000 mg | Freq: Three times a day (TID) | INTRAMUSCULAR | Status: DC | PRN

## 2017-10-16 MED ORDER — HYDROCODONE-ACETAMINOPHEN 5-325 MG PO TABS
1.0000 | ORAL_TABLET | ORAL | Status: DC | PRN
  Administered 2017-10-16: 1 via ORAL
  Administered 2017-10-16: 2 via ORAL
  Administered 2017-10-16: 1 via ORAL
  Administered 2017-10-17 – 2017-10-21 (×4): 2 via ORAL
  Filled 2017-10-16 (×3): qty 2
  Filled 2017-10-16: qty 1
  Filled 2017-10-16 (×2): qty 2
  Filled 2017-10-16: qty 1

## 2017-10-16 MED ORDER — PROPOFOL 10 MG/ML IV BOLUS
INTRAVENOUS | Status: DC | PRN
  Administered 2017-10-16: 100 mg via INTRAVENOUS

## 2017-10-16 MED ORDER — FENTANYL CITRATE (PF) 250 MCG/5ML IJ SOLN
INTRAMUSCULAR | Status: AC
  Filled 2017-10-16: qty 5

## 2017-10-16 SURGICAL SUPPLY — 34 items
APL SKNCLS STERI-STRIP NONHPOA (GAUZE/BANDAGES/DRESSINGS) ×3
BENZOIN TINCTURE PRP APPL 2/3 (GAUZE/BANDAGES/DRESSINGS) ×3 IMPLANT
BLADE SAW RECIP 87.9 MT (BLADE) ×3 IMPLANT
BNDG COHESIVE 6X5 TAN STRL LF (GAUZE/BANDAGES/DRESSINGS) ×2 IMPLANT
CANISTER WOUND CARE 500ML ATS (WOUND CARE) ×1 IMPLANT
COVER SURGICAL LIGHT HANDLE (MISCELLANEOUS) ×2 IMPLANT
CUFF TOURNIQUET SINGLE 34IN LL (TOURNIQUET CUFF) IMPLANT
DRAPE INCISE IOBAN 66X45 STRL (DRAPES) ×4 IMPLANT
DRAPE U-SHAPE 47X51 STRL (DRAPES) ×2 IMPLANT
DRESSING PREVENA PLUS CUSTOM (GAUZE/BANDAGES/DRESSINGS) ×1 IMPLANT
DRSG PREVENA PLUS CUSTOM (GAUZE/BANDAGES/DRESSINGS) ×2
DURAPREP 26ML APPLICATOR (WOUND CARE) ×2 IMPLANT
ELECT REM PT RETURN 9FT ADLT (ELECTROSURGICAL) ×2
ELECTRODE REM PT RTRN 9FT ADLT (ELECTROSURGICAL) ×1 IMPLANT
GAUZE PACKING IODOFORM 2 (PACKING) ×1 IMPLANT
GLOVE BIOGEL PI IND STRL 9 (GLOVE) ×1 IMPLANT
GLOVE BIOGEL PI INDICATOR 9 (GLOVE) ×1
GLOVE SURG ORTHO 9.0 STRL STRW (GLOVE) ×2 IMPLANT
GOWN STRL REUS W/ TWL XL LVL3 (GOWN DISPOSABLE) ×2 IMPLANT
GOWN STRL REUS W/TWL XL LVL3 (GOWN DISPOSABLE) ×4
KIT BASIN OR (CUSTOM PROCEDURE TRAY) ×2 IMPLANT
KIT ROOM TURNOVER OR (KITS) ×2 IMPLANT
MANIFOLD NEPTUNE II (INSTRUMENTS) ×2 IMPLANT
NS IRRIG 1000ML POUR BTL (IV SOLUTION) ×2 IMPLANT
PACK ORTHO EXTREMITY (CUSTOM PROCEDURE TRAY) ×2 IMPLANT
PAD ARMBOARD 7.5X6 YLW CONV (MISCELLANEOUS) ×3 IMPLANT
STAPLER VISISTAT 35W (STAPLE) ×1 IMPLANT
STOCKINETTE IMPERVIOUS LG (DRAPES) ×1 IMPLANT
SUT ETHILON 2 0 PSLX (SUTURE) ×6 IMPLANT
SUT SILK 2 0 (SUTURE) ×4
SUT SILK 2-0 18XBRD TIE 12 (SUTURE) ×1 IMPLANT
TOWEL GREEN STERILE FF (TOWEL DISPOSABLE) ×2 IMPLANT
TUBE CONNECTING 20X1/4 (TUBING) ×2 IMPLANT
YANKAUER SUCT BULB TIP NO VENT (SUCTIONS) ×2 IMPLANT

## 2017-10-16 NOTE — Progress Notes (Signed)
PROGRESS NOTE    Andrew [name]  UPJ:[id number] DOB: [birth year] DOA: 10/10/2017 PCP: Christain Sacramento, MD    Brief Narrative: [name] a 72 y.o.malewith medical history significant forType 2 DM, HTN, HLD, R eye blindness, tobacco abuse presents to the ED c/o worsening RLE cellulitis. Pt reported he sustained a wound to the foot about 3 weeks ago, that gradually spread to his lower leg. Pt reported his PCP prescribed PO keflex for 10 days of which he was compliant to, but didn't help with the infection. Pt denies any fever/chills, nausea/vomiting, chest pain, SOB, abdominal pain. Of note, pt has had recurrent cellulitis with very poor wound healing. Last R arm infection, required a skin graft. In the ED, noted was a very foul smelling/wet gangrene/eschar on the ant lower leg to dorsum of foot. Surrounding erythema noted. Pt was noted to be septic and admitted for further management.   Assessment & Plan:   Principal Problem:   Cellulitis Active Problems:   Hyperlipidemia   Tobacco abuse disorder   Essential hypertension   Diabetic autonomic neuropathy associated with type 2 diabetes mellitus (HCC)   Legal blindness   Atherosclerosis of native arteries of extremities with gangrene, right leg (HCC)   Goals of care, counseling/discussion   Palliative care encounter   Malnutrition of moderate degree  Sepsis due to right leg cellulitis complicated by gangrene/eschar/necrosis Elevated WBC 30s, elevated LA on admission ESR 92,  CRP 13.5, pre-albumin <5 ABI right and left 0.9---0.9 Plastic surgery consulted, rec amputation, not a good candidate for Acell/VAC treatment Dr Sharol Given recommend Amputation.  Continue with IV vancomycin, cefepime.  WBC trending down. 26--20 Post AKA 3-02, plan to repeat CBC this afternoon.   Scrotal infection ?  Per nurse documentation patient had drainage from scrotum. I didn't see any open wound. Will get [place] to better evaluate. Continue with IV  antibiotics.   Anemia;  Will check anemia panel.  Repeat hb this afternoon after  sx.   DM type 2;  Continue with lantus. 20 units.  SSI and  meals coverage.   Transaminases; improved. Ammonia not significant elevated 36.  HLD; on Zocor.   HTN; Continue with Norvasc, metoprolol.   Tobacco abuse Advised to quit Nicotine patch ordered  Hyponatremia; IV fluids.    DVT prophylaxis: SCD Code Status: DNR Family Communication: care discussed with patient  Disposition Plan: to be determine.    Consultants:   Dr Sharol Given.    Procedures:   ABI   Antimicrobials:  Vancomycin, cefepime, flagyl 2-25  Subjective: He denies chest pian, dyspnea.    Objective: Vitals:   10/15/17 0949 10/15/17 1742 10/15/17 2037 10/16/17 0533  BP: (!) 141/73 (!) 131/51 (!) 133/55 (!) 135/48  Pulse: 78 75 75 78  Resp: 16 18 16 16   Temp: 98.8 F (37.1 C) 98.2 F (36.8 C) 98.7 F (37.1 C) 98.9 F (37.2 C)  TempSrc: Oral Oral Oral Oral  SpO2: 98% 97% 93% 92%  Weight:   77.5 kg (170 lb 13.7 oz)   Height:        Intake/Output Summary (Last 24 hours) at 10/16/2017 0735 Last data filed at 10/16/2017 0612 Gross per 24 hour  Intake 1571.67 ml  Output 1100 ml  Net 471.67 ml   Filed Weights   10/11/17 2046 10/13/17 2135 10/15/17 2037  Weight: 72.6 kg (160 lb 1 oz) 72.6 kg (160 lb 1 oz) 77.5 kg (170 lb 13.7 oz)    Examination:  General exam: NAD Respiratory  system: CTA Cardiovascular system: S 1, S 2 RRR Gastrointestinal system: BS present, soft, nt Central nervous system: Alert  Extremities: Right lower extremity with dressing, Necrosis of skin.  GU; scrotum, no significant redness, no wound    Data Reviewed: I have personally reviewed following labs and imaging studies  CBC: Recent Labs  Lab 10/12/17 0743 10/13/17 0512 10/14/17 0422 10/15/17 0437 10/16/17 0632  WBC 28.3* 26.9* 24.8* 20.9* 22.2*  NEUTROABS 24.1* 22.8* 20.4* 16.3* PENDING  HGB 9.9* 10.0* 9.9* 9.3* 9.5*    HCT 30.2* 30.1* 30.0* 28.9* 29.0*  MCV 84.6 84.8 83.6 83.8 84.1  PLT 636* 737* 752* 764* 412*   Basic Metabolic Panel: Recent Labs  Lab 10/12/17 0743 10/13/17 0512 10/14/17 0422 10/15/17 0437 10/15/17 1647  NA 134* 132* 133* 133* 130*  K 3.4* 3.5 3.7 3.8 4.2  CL 100* 98* 99* 99* 97*  CO2 22 21* 25 26 25   GLUCOSE 167* 159* 231* 158* 339*  BUN 9 7 9 9 13   CREATININE 0.52* 0.49* 0.54* 0.55* 0.61  CALCIUM 8.3* 8.3* 8.5* 8.5* 8.2*   GFR: Estimated Creatinine Clearance: 78 mL/min (by C-G formula based on SCr of 0.61 mg/dL). Liver Function Tests: Recent Labs  Lab 10/10/17 1451 10/14/17 0422  AST 50* 26  ALT 27 16*  ALKPHOS 178* 142*  BILITOT 0.5 0.3  PROT 6.6 5.3*  ALBUMIN 2.4* 1.9*   No results for input(s): LIPASE, AMYLASE in the last 168 hours. Recent Labs  Lab 10/14/17 1415  AMMONIA 36*   Coagulation Profile: No results for input(s): INR, PROTIME in the last 168 hours. Cardiac Enzymes: No results for input(s): CKTOTAL, CKMB, CKMBINDEX, TROPONINI in the last 168 hours. BNP (last 3 results) No results for input(s): PROBNP in the last 8760 hours. HbA1C: No results for input(s): HGBA1C in the last 72 hours. CBG: Recent Labs  Lab 10/15/17 0747 10/15/17 1137 10/15/17 1600 10/15/17 2036 10/16/17 0729  GLUCAP 150* 237* 337* 236* 272*   Lipid Profile: No results for input(s): CHOL, HDL, LDLCALC, TRIG, CHOLHDL, LDLDIRECT in the last 72 hours. Thyroid Function Tests: No results for input(s): TSH, T4TOTAL, FREET4, T3FREE, THYROIDAB in the last 72 hours. Anemia Panel: No results for input(s): VITAMINB12, FOLATE, FERRITIN, TIBC, IRON, RETICCTPCT in the last 72 hours. Sepsis Labs: Recent Labs  Lab 10/10/17 1517 10/10/17 1646 10/10/17 1924 10/11/17 0538 10/12/17 0743  PROCALCITON  --   --  0.16 0.16 0.22  LATICACIDVEN 2.11* 2.05* 1.3  --   --     Recent Results (from the past 240 hour(s))  Culture, blood (routine x 2)     Status: None   Collection Time:  10/10/17  4:20 PM  Result Value Ref Range Status   Specimen Description BLOOD RIGHT HAND  Final   Special Requests   Final    BOTTLES DRAWN AEROBIC AND ANAEROBIC Blood Culture adequate volume   Culture   Final    NO GROWTH 5 DAYS Performed at [facility], [place] [address] [place], [place] [address]    Report Status 10/15/2017 FINAL  Final  Culture, blood (routine x 2)     Status: None   Collection Time: 10/10/17  4:35 PM  Result Value Ref Range Status   Specimen Description BLOOD RIGHT ANTECUBITAL  Final   Special Requests   Final    BOTTLES DRAWN AEROBIC AND ANAEROBIC Blood Culture adequate volume   Culture   Final    NO GROWTH 5 DAYS Performed at [facility]  Lab, [address], [place], [place] [address]    Report Status 10/15/2017 FINAL  Final  MRSA PCR Screening     Status: Abnormal   Collection Time: 10/10/17  8:32 PM  Result Value Ref Range Status   MRSA by PCR POSITIVE (A) NEGATIVE Final    Comment:        The GeneXpert MRSA Assay (FDA approved for NASAL specimens only), is one component of a comprehensive MRSA colonization surveillance program. It is not intended to diagnose MRSA infection nor to guide or monitor treatment for MRSA infections. RESULT CALLED TO, READ BACK BY AND VERIFIED WITH: MITZ,A RN 10/10/17 AT 2227 [facility]          Radiology Studies: No results found.      Scheduled Meds: . [MAR Hold] amLODipine  5 mg Oral Daily  . [MAR Hold] aspirin EC  325 mg Oral Daily  . [MAR Hold] atorvastatin  20 mg Oral q1800  . Chlorhexidine Gluconate Cloth  6 each Topical Q0600  . [MAR Hold] feeding supplement (ENSURE ENLIVE)  237 mL Oral Q24H  . [MAR Hold] feeding supplement (GLUCERNA SHAKE)  237 mL Oral BID BM  . [MAR Hold] gabapentin  100 mg Oral TID  . [MAR Hold] insulin aspart  0-5 Units Subcutaneous QHS  . [MAR Hold] insulin aspart  0-9 Units Subcutaneous TID WC  . [MAR Hold] insulin aspart  3 Units Subcutaneous TID WC  . [MAR Hold]  insulin glargine  20 Units Subcutaneous Daily  . [MAR Hold] metoprolol succinate  50 mg Oral Daily  . [MAR Hold] metroNIDAZOLE  500 mg Oral Q8H  . [MAR Hold] multivitamin with minerals  1 tablet Oral Daily  . [MAR Hold] mupirocin ointment  1 application Nasal BID  . [MAR Hold] nicotine  21 mg Transdermal QHS  . [MAR Hold] polyethylene glycol  17 g Oral Daily  . [MAR Hold] senna-docusate  1 tablet Oral BID  . [MAR Hold] sodium chloride flush  3 mL Intravenous Q12H  . [MAR Hold] thiamine  100 mg Oral Daily   Continuous Infusions: . sodium chloride 50 mL/hr at 10/15/17 2158  . [MAR Hold] ceFEPime (MAXIPIME) IV Stopped (10/16/17 0000)  . clindamycin (CLEOCIN) IV    . [MAR Hold] vancomycin Stopped (10/16/17 0000)     LOS: 6 days    Time spent: 35 minutes.,     Elmarie Shiley, MD [facility] Pager [phone]  If 7PM-7AM, please contact night-coverage [email or web address] Password [facility] 10/16/2017, 7:35 AM

## 2017-10-16 NOTE — Op Note (Signed)
10/16/2017  8:32 AM  PATIENT:  Andrew [name]    PRE-OPERATIVE DIAGNOSIS:  Gangrene Right Leg  POST-OPERATIVE DIAGNOSIS:  Same  PROCEDURE:  RIGHT ABOVE KNEE AMPUTATION, application Praveena wound VAC.  SURGEON:  Newt Minion, MD  PHYSICIAN ASSISTANT:None ANESTHESIA:   General  PREOPERATIVE INDICATIONS:  Andrew [name] is a  72 y.o. male with a diagnosis of Gangrene Right Leg who failed conservative measures and elected for surgical management.    The risks benefits and alternatives were discussed with the patient preoperatively including but not limited to the risks of infection, bleeding, nerve injury, cardiopulmonary complications, the need for revision surgery, among others, and the patient was willing to proceed.  OPERATIVE IMPLANTS: Praveena wound VAC  @ENCIMAGES @  OPERATIVE FINDINGS: No ischemic muscle minimal microcirculation  OPERATIVE PROCEDURE: Patient was brought the operating room and underwent a general anesthetic.  After adequate levels of anesthesia were obtained patient's right lower extremity was prepped using DuraPrep draped into a sterile field a timeout was called.  A fishmouth incision was made through the distal thigh this was carried down to the medial vascular bundle.  This was suture ligated with 2-0 silk.  A reciprocating saw was used to amputate the leg through the mid femur.  The leg was delivered off the field and sent to pathology.  Electrocautery was used for further hemostasis.  There is minimal petechial bleeding there is no ischemic muscle there is no abscess.  The deep and superficial fascial layers and skin was closed using 2-0 nylon.  A Praveena wound VAC was applied this had a good suction fit patient was extubated taken to the PACU in stable condition.   DISCHARGE PLANNING:  Antibiotic duration: Continue antibiotics for 24 hours postoperatively.  Weightbearing: Nonweightbearing on the right.  Pain medication: Postoperative pain medications  ordered  Dressing care/ Wound VAC: Continue wound VAC until discharged in the wound VAC may be removed at discharge and a dry dressing applied  Ambulatory devices: Had to chair transfers  Discharge to: Skilled nursing.  Follow-up: In the office 1 week post operative.

## 2017-10-16 NOTE — Progress Notes (Signed)
During patients pre operative Hibiclens scrub, his scrotum began squirting copious amounts of purulent, foul smelling brown and green drainage from the right side. I expressed appox. 200cc of this fluid and then repeated the scrub. Patient stated it was painful but he felt some relief afterward.

## 2017-10-16 NOTE — Progress Notes (Signed)
Patient refused breakfast and lunch. I educated and encouraged patient to eat. I offered to help with feeding the patient. Will continue to monitor.

## 2017-10-16 NOTE — Transfer of Care (Signed)
Immediate Anesthesia Transfer of Care Note  Patient: Andrew [name]  Procedure(s) Performed: RIGHT ABOVE KNEE AMPUTATION (Right Leg Lower)  Patient Location: PACU  Anesthesia Type:General  Level of Consciousness: awake and alert   Airway & Oxygen Therapy: Patient Spontanous Breathing and Patient connected to nasal cannula oxygen  Post-op Assessment: Report given to RN and Post -op Vital signs reviewed and stable  Post vital signs: Reviewed  Last Vitals:  Vitals:   10/15/17 2037 10/16/17 0533  BP: (!) 133/55 (!) 135/48  Pulse: 75 78  Resp: 16 16  Temp: 37.1 C 37.2 C  SpO2: 93% 92%    Last Pain:  Vitals:   10/16/17 0533  TempSrc: Oral  PainSc:       Patients Stated Pain Goal: 0 (61/16/43 5391)  Complications: No apparent anesthesia complications

## 2017-10-16 NOTE — Progress Notes (Signed)
Per floor nurse patient had sacral abscess that ruptured. Notified Dr. Tyrell Antonio will hold patient's surgery until assessed to determine if surgical intervention is needed.

## 2017-10-16 NOTE — Anesthesia Preprocedure Evaluation (Signed)
Anesthesia Evaluation  Patient identified by MRN, date of birth, ID band Patient awake    Reviewed: Allergy & Precautions, NPO status , Patient's Chart, lab work & pertinent test results, reviewed documented beta blocker date and time   Airway Mallampati: II  TM Distance: >3 FB Neck ROM: Full    Dental  (+) Poor Dentition, Dental Advisory Given   Pulmonary neg pulmonary ROS, Current Smoker,    breath sounds clear to auscultation       Cardiovascular hypertension, Pt. on medications and Pt. on home beta blockers + Peripheral Vascular Disease and +CHF  negative cardio ROS   Rhythm:Regular Rate:Normal     Neuro/Psych PSYCHIATRIC DISORDERS negative neurological ROS     GI/Hepatic negative GI ROS, Neg liver ROS,   Endo/Other  negative endocrine ROSdiabetes, Type 2, Insulin Dependent, Oral Hypoglycemic Agents  Renal/GU Renal disease  negative genitourinary   Musculoskeletal negative musculoskeletal ROS (+)   Abdominal   Peds negative pediatric ROS (+)  Hematology negative hematology ROS (+)   Anesthesia Other Findings - HLD - AAA  Day of surgery medications reviewed with the patient.  Reproductive/Obstetrics                             Anesthesia Physical Anesthesia Plan  ASA: III  Anesthesia Plan: General   Post-op Pain Management:    Induction: Intravenous  PONV Risk Score and Plan: 1 and Ondansetron  Airway Management Planned: LMA and Oral ETT  Additional Equipment: None  Intra-op Plan:   Post-operative Plan: Extubation in OR  Informed Consent: I have reviewed the patients History and Physical, chart, labs and discussed the procedure including the risks, benefits and alternatives for the proposed anesthesia with the patient or authorized representative who has indicated his/her understanding and acceptance.   Dental advisory given  Plan Discussed with: CRNA and  Surgeon  Anesthesia Plan Comments:         Anesthesia Quick Evaluation

## 2017-10-16 NOTE — Anesthesia Procedure Notes (Signed)
Procedure Name: LMA Insertion Date/Time: 10/16/2017 7:45 AM Performed by: Clearnce Sorrel, CRNA Pre-anesthesia Checklist: Patient identified, Emergency Drugs available, Suction available, Patient being monitored and Timeout performed Patient Re-evaluated:Patient Re-evaluated prior to induction Oxygen Delivery Method: Circle system utilized Preoxygenation: Pre-oxygenation with 100% oxygen Induction Type: IV induction LMA: LMA inserted LMA Size: 4.0 Number of attempts: 1 Placement Confirmation: positive ETCO2 and breath sounds checked- equal and bilateral Tube secured with: Tape Dental Injury: Teeth and Oropharynx as per pre-operative assessment

## 2017-10-16 NOTE — Interval H&P Note (Signed)
History and Physical Interval Note:  10/16/2017 7:32 AM  [name]  has presented today for surgery, with the diagnosis of Gangrene Right Leg  The various methods of treatment have been discussed with the patient and family. After consideration of risks, benefits and other options for treatment, the patient has consented to  Procedure(s): RIGHT ABOVE KNEE AMPUTATION (Right) as a surgical intervention .  The patient's history has been reviewed, patient examined, no change in status, stable for surgery.  I have reviewed the patient's chart and labs.  Questions were answered to the patient's satisfaction.     Andrew [name]

## 2017-10-16 NOTE — Anesthesia Postprocedure Evaluation (Signed)
Anesthesia Post Note  Patient: Andrew [name]  Procedure(s) Performed: RIGHT ABOVE KNEE AMPUTATION (Right Leg Lower)     Patient location during evaluation: PACU Anesthesia Type: General Level of consciousness: awake and alert Pain management: pain level controlled Vital Signs Assessment: post-procedure vital signs reviewed and stable Respiratory status: spontaneous breathing, nonlabored ventilation, respiratory function stable and patient connected to nasal cannula oxygen Cardiovascular status: blood pressure returned to baseline and stable Postop Assessment: no apparent nausea or vomiting Anesthetic complications: no    Last Vitals:  Vitals:   10/16/17 0845 10/16/17 0900  BP: (!) 149/85   Pulse: 87 87  Resp: 16 20  Temp:  (!) 36.1 C  SpO2: 93% 93%    Last Pain:  Vitals:   10/16/17 0845  TempSrc:   PainSc: 0-No pain                 Dail Meece

## 2017-10-17 ENCOUNTER — Encounter (HOSPITAL_COMMUNITY): Payer: Self-pay | Admitting: Orthopedic Surgery

## 2017-10-17 LAB — CBC WITH DIFFERENTIAL/PLATELET
Basophils Absolute: 0 10*3/uL (ref 0.0–0.1)
Basophils Relative: 0 %
EOS PCT: 1 %
Eosinophils Absolute: 0.2 10*3/uL (ref 0.0–0.7)
HEMATOCRIT: 25.6 % — AB (ref 39.0–52.0)
HEMOGLOBIN: 8.2 g/dL — AB (ref 13.0–17.0)
LYMPHS ABS: 2.2 10*3/uL (ref 0.7–4.0)
LYMPHS PCT: 14 %
MCH: 27.4 pg (ref 26.0–34.0)
MCHC: 32 g/dL (ref 30.0–36.0)
MCV: 85.6 fL (ref 78.0–100.0)
MONOS PCT: 8 %
Monocytes Absolute: 1.3 10*3/uL — ABNORMAL HIGH (ref 0.1–1.0)
Neutro Abs: 12.3 10*3/uL — ABNORMAL HIGH (ref 1.7–7.7)
Neutrophils Relative %: 77 %
Platelets: 732 10*3/uL — ABNORMAL HIGH (ref 150–400)
RBC: 2.99 MIL/uL — AB (ref 4.22–5.81)
RDW: 15.9 % — ABNORMAL HIGH (ref 11.5–15.5)
WBC: 16 10*3/uL — AB (ref 4.0–10.5)

## 2017-10-17 LAB — BASIC METABOLIC PANEL
Anion gap: 8 (ref 5–15)
BUN: 8 mg/dL (ref 6–20)
CHLORIDE: 99 mmol/L — AB (ref 101–111)
CO2: 26 mmol/L (ref 22–32)
Calcium: 8.2 mg/dL — ABNORMAL LOW (ref 8.9–10.3)
Creatinine, Ser: 0.57 mg/dL — ABNORMAL LOW (ref 0.61–1.24)
GFR calc non Af Amer: >60 mL/min (ref >60)
GLUCOSE: 134 mg/dL — AB (ref 65–99)
POTASSIUM: 4 mmol/L (ref 3.5–5.1)
Sodium: 133 mmol/L — ABNORMAL LOW (ref 135–145)

## 2017-10-17 LAB — GLUCOSE, CAPILLARY
GLUCOSE-CAPILLARY: 170 mg/dL — AB (ref 65–99)
GLUCOSE-CAPILLARY: 169 mg/dL — AB (ref 65–99)
GLUCOSE-CAPILLARY: 171 mg/dL — AB (ref 65–99)
Glucose-Capillary: 123 mg/dL — ABNORMAL HIGH (ref 65–99)

## 2017-10-17 MED ORDER — BISACODYL 10 MG RE SUPP
10.0000 mg | Freq: Once | RECTAL | Status: DC
  Filled 2017-10-17: qty 1

## 2017-10-17 MED ORDER — FERROUS SULFATE 325 (65 FE) MG PO TABS
325.0000 mg | ORAL_TABLET | Freq: Two times a day (BID) | ORAL | Status: DC
  Administered 2017-10-17 – 2017-10-21 (×10): 325 mg via ORAL
  Filled 2017-10-17 (×9): qty 1

## 2017-10-17 NOTE — Evaluation (Signed)
Physical Therapy Evaluation Patient Details Name: Andrew [name] MRN: [id number] DOB: 07/05/1946 Today's Date: 10/17/2017   History of Present Illness    72 y.o. male admitted on 10/10/2017 with cellulitis of right leg complicated by gangrene/necrosis. S/p right transfemoral amputation on 10/16/17. Active problems include sepsis, scrotal infection, anemia    Clinical Impression  Pt presents with severe limitations to mobility due to pain, weakness, limited joint ROM, complicated by previous poor health status and apparent cognitive problems, and impacting ability and willingness to move around and change positions even at the level of the bed.  Unsure pt has adequate assistance at home, and currently home is accessible only by stairs.  Recommend beginning rehab efforts in acute hospital setting and continue in SNF setting at discharge. See below for details of (limited) exam and care plan for goals of care.    Follow Up Recommendations SNF    Equipment Recommendations  Wheelchair (measurements PT)    Recommendations for Other Services       Precautions / Restrictions Precautions Precautions: Fall Required Braces or Orthoses: (wound VAC to residual limb)      Mobility  Bed Mobility Overal bed mobility: Needs Assistance Bed Mobility: Rolling Rolling: Max assist         General bed mobility comments: pt resistant to more attempts at mobility and 'shut down' at this point in examination, citing pain in limb  Transfers                    Ambulation/Gait                Stairs            Wheelchair Mobility    Modified Rankin (Stroke Patients Only)       Balance Overall balance assessment: History of Falls                                           Pertinent Vitals/Pain Pain Assessment: 0-10 Pain Score: 8  Pain Location: operated leg Pain Intervention(s): Limited activity within patient's tolerance;Monitored during  session;Repositioned    Home Living Family/patient expects to be discharged to:: Private residence Living Arrangements: Alone;Non-relatives/Friends Available Help at Discharge: Friend(s);Available PRN/intermittently;Neighbor Type of Home: Mobile home Home Access: Stairs to enter Entrance Stairs-Rails: Can reach both Entrance Stairs-Number of Steps: 3 Home Layout: One level Home Equipment: Wheelchair - manual Additional Comments: unsure of accuracy of information as pt reticent to participate.  Unable to connect new surgery/AKA and stairs at home, as pt expects to go home.  Says he's alone, though prior notes indicate he has children to help.     Prior Function Level of Independence: Needs assistance   Gait / Transfers Assistance Needed: prior history of falls, now will be w/c bound at least for short term  ADL's / Homemaking Assistance Needed: per previous notes, family assist with meal and bathing  Comments: generally unreliable historian; pt tends to stop communicating when pressed for information     Hand Dominance   Dominant Hand: Left    Extremity/Trunk Assessment   Upper Extremity Assessment Upper Extremity Assessment: Generalized weakness    Lower Extremity Assessment Lower Extremity Assessment: Generalized weakness;RLE deficits/detail RLE Deficits / Details: new transfemoral amputation due to pervasive gangrene RLE: Unable to fully assess due to pain       Communication   Communication: No difficulties  Cognition Arousal/Alertness: Awake/alert Behavior During Therapy: Flat affect;Restless Overall Cognitive Status: No family/caregiver present to determine baseline cognitive functioning                                 General Comments: as above, unreliable and inconsistent historian      General Comments General comments (skin integrity, edema, etc.): wound VAC in place at surgical site    Exercises     Assessment/Plan    PT Assessment  Patient needs continued PT services  PT Problem List Pain;Decreased knowledge of precautions;Decreased safety awareness;Decreased knowledge of use of DME;Decreased mobility;Decreased activity tolerance;Decreased strength;Decreased range of motion       PT Treatment Interventions Wheelchair mobility training;Patient/family education;Therapeutic activities;Functional mobility training;Therapeutic exercise    PT Goals (Current goals can be found in the Care Plan section)  Acute Rehab PT Goals Patient Stated Goal: home PT Goal Formulation: Patient unable to participate in goal setting Time For Goal Achievement: 10/31/17 Potential to Achieve Goals: Fair    Frequency Min 3X/week   Barriers to discharge Inaccessible home environment;Decreased caregiver support unclear whether he has any substantial support    Co-evaluation               AM-PAC PT "6 Clicks" Daily Activity  Outcome Measure Difficulty turning over in bed (including adjusting bedclothes, sheets and blankets)?: A Lot Difficulty moving from lying on back to sitting on the side of the bed? : A Lot Difficulty sitting down on and standing up from a chair with arms (e.g., wheelchair, bedside commode, etc,.)?: Unable Help needed moving to and from a bed to chair (including a wheelchair)?: Total Help needed walking in hospital room?: Total Help needed climbing 3-5 steps with a railing? : Total 6 Click Score: 8    End of Session   Activity Tolerance: Patient limited by pain Patient left: in bed;with call bell/phone within reach Nurse Communication: Mobility status;Patient requests pain meds PT Visit Diagnosis: Other abnormalities of gait and mobility (R26.89);History of falling (Z91.81);Pain Pain - Right/Left: Right Pain - part of body: Leg    Time: 8786-7672 PT Time Calculation (min) (ACUTE ONLY): 13 min   Charges:   PT Evaluation $PT Eval Moderate Complexity: 1 Mod     PT G Codes:        Kearney Hard, PT,  DPT, MS Board Certified Clinical Specialist  Herbie Drape 10/17/2017, 1:36 PM

## 2017-10-17 NOTE — Progress Notes (Signed)
Pharmacy Antibiotic Note  Andrew [name] is a 72 y.o. male admitted on 10/10/2017 with cellulitis of right leg complicated by gangrene/necrosis.  Pharmacy has been consulted for vancomycin and cefepime dosing. Pt failed outpatient Keflex.   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 Concern on 3/3 for possible scrotal infection, so IV ABX continued for one more day. May consider de-escalation on 3/4 should he continue to improve. WBCs down to 16, afebrile, renal fx stable.  Plan: -Continue Vancomycin 750 mg IV Q 12 hours. Vanc trough goal 10-15 mcg/mL -Continue Cefepime 2g Q12hrs -Continue Metronidazole 500mg  Q8hrs -Monitor CBC, renal fx, cultures and clinical progress -VT prn  Height: 5\' 7"  (170.2 cm) Weight: 170 lb 13.7 oz (77.5 kg) IBW/kg (Calculated) : 66.1  Temp (24hrs), Avg:98.3 F (36.8 C), Min:97.9 F (36.6 C), Max:98.7 F (37.1 C)  Recent Labs  Lab 10/10/17 1517 10/10/17 1646 10/10/17 1924  10/13/17 0512 10/14/17 0422 10/14/17 2005 10/15/17 0437 10/15/17 1647 10/16/17 0632 10/16/17 1601 10/17/17 0600  WBC  --   --   --    < > 26.9* 24.8*  --  20.9*  --  22.2* 19.6* 16.0*  CREATININE  --   --   --    < > 0.49* 0.54*  --  0.55* 0.61  --   --  0.57*  LATICACIDVEN 2.11* 2.05* 1.3  --   --   --   --   --   --   --   --   --   VANCOTROUGH  --   --   --   --   --   --  13*  --   --   --   --   --    < > = values in this interval not displayed.    Estimated Creatinine Clearance: 78 mL/min (A) (by C-G formula based on SCr of 0.57 mg/dL (L)).    Allergies  Allergen Reactions  . Penicillins Rash and Other (See Comments)    Tolerated Zosyn Jan 2018  PATIENT HAS HAD A PCN REACTION WITH IMMEDIATE RASH, FACIAL/TONGUE/THROAT SWELLING, SOB, OR LIGHTHEADEDNESS WITH HYPOTENSION:  #  #  #  YES  #  #  #   Has patient had a PCN reaction causing severe rash involving mucus membranes or skin necrosis:NO Has patient had a PCN reaction that required  hospitalization NO Has patient had a PCN reaction occurring within the last 10 years:NO    Antimicrobials this admission: Vancomycin 2/24> Cefepime 2/24> Flagyl 2/24>    Dose adjustments this admission:  2/28: Vancomycin trough therapeutic at 13  Microbiology results: 2/24 BC x 2 >> NGTD MRSA PCR +  Thank you for allowing [place] to participate in this patients care.   Patterson Hammersmith PharmD PGY1 Pharmacy Practice Resident 10/17/2017 12:35 PM Pager: [phone] Phone: [phone]

## 2017-10-17 NOTE — Progress Notes (Addendum)
PROGRESS NOTE    Andrew [name]  GQQ:[id number] DOB: 1945-12-18 DOA: 10/10/2017 PCP: Christain Sacramento, MD    Brief Narrative: Andrew [name] a 72 y.o.malewith medical history significant forType 2 DM, HTN, HLD, R eye blindness, tobacco abuse presents to the ED c/o worsening RLE cellulitis. Pt reported he sustained a wound to the foot about 3 weeks ago, that gradually spread to his lower leg. Pt reported his PCP prescribed PO keflex for 10 days of which he was compliant to, but didn't help with the infection. Pt denies any fever/chills, nausea/vomiting, chest pain, SOB, abdominal pain. Of note, pt has had recurrent cellulitis with very poor wound healing. Last R arm infection, required a skin graft. In the ED, noted was a very foul smelling/wet gangrene/eschar on the ant lower leg to dorsum of foot. Surrounding erythema noted. Pt was noted to be septic and admitted for further management.   Assessment & Plan:   Principal Problem:   Cellulitis Active Problems:   Hyperlipidemia   Tobacco abuse disorder   Essential hypertension   Diabetic autonomic neuropathy associated with type 2 diabetes mellitus (HCC)   Legal blindness   Atherosclerosis of native arteries of extremities with gangrene, right leg (HCC)   Goals of care, counseling/discussion   Palliative care encounter   Malnutrition of moderate degree  Sepsis due to right leg cellulitis complicated by gangrene/eschar/necrosis Elevated WBC 30s, elevated LA on admission ESR 92,  CRP 13.5, pre-albumin <5 ABI right and left 0.9---0.9 Plastic surgery consulted, rec amputation, not a good candidate for Acell/VAC treatment Dr Sharol Given recommend Amputation.  Continue with IV vancomycin, cefepime.  WBC trending down. 26--20--16 Post AKA 3-02. Has wound vac. Follow ortho recommendations.   Scrotal infection ?  Per nurse documentation patient had drainage from scrotum. Continue with IV antibiotics. [place] no abscess.  Discussed case with  urology, recommend Antibiotics for 5 days.   Anemia;  Check anemia panel.  Hb stable at 8  DM type 2;  Continue with lantus. 20 units.  SSI and  meals coverage.   Transaminases; improved. Ammonia not significant elevated 36.  HLD; on Zocor.   HTN; Continue with Norvasc, metoprolol.   Tobacco abuse Advised to quit Nicotine patch ordered  Hyponatremia; IV fluids.   Constipation; suppository ordered.   DVT prophylaxis: SCD Code Status: DNR Family Communication: care discussed with patient  Disposition Plan: to be determine.    Consultants:   Dr Sharol Given.    Procedures:   ABI   Antimicrobials:  Vancomycin, cefepime, flagyl 2-25  Subjective: He denies chest pian, dyspnea.    Objective: Vitals:   10/16/17 0900 10/16/17 1826 10/17/17 0546 10/17/17 0947  BP:  140/68 (!) 142/88 (!) 135/55  Pulse: 87 88 83 80  Resp: _0 Temp: (!) 97 F (36.1 C) 97.9 F (36.6 C) 98.7 F (37.1 C) 98.2 F (36.8 C)  TempSrc:  Oral Oral Oral  SpO2: 93% 94% 92% 98%  Weight:      Height:        Intake/Output Summary (Last 24 hours) at 10/17/2017 1143 Last data filed at 10/17/2017 0900 Gross per 24 hour  Intake 760 ml  Output 427 ml  Net 333 ml   Filed Weights   10/11/17 2046 10/13/17 2135 10/15/17 2037  Weight: 72.6 kg (160 lb 1 oz) 72.6 kg (160 lb 1 oz) 77.5 kg (170 lb 13.7 oz)    Examination:  General exam: NAD Respiratory system: normal respiratory effort, CTA Cardiovascular  system: S 1, S 2 RRR Gastrointestinal system: BS present, soft, nt Central nervous system: alert  Extremities: S/P AKA, wound vac in place.  GU; scrotum, no significant redness, mild localized skin thickening redness,     Data Reviewed: I have personally reviewed following labs and imaging studies  CBC: Recent Labs  Lab 10/13/17 0512 10/14/17 0422 10/15/17 0437 10/16/17 0632 10/16/17 1601 10/17/17 0600  WBC 26.9* 24.8* 20.9* 22.2* 19.6* 16.0*  NEUTROABS 22.8* 20.4* 16.3*  18.2*  --  12.3*  HGB 10.0* 9.9* 9.3* 9.5* 8.2* 8.2*  HCT 30.1* 30.0* 28.9* 29.0* 25.6* 25.6*  MCV 84.8 83.6 83.8 84.1 84.2 85.6  PLT 737* 752* 764* 717* 700* 597*   Basic Metabolic Panel: Recent Labs  Lab 10/13/17 0512 10/14/17 0422 10/15/17 0437 10/15/17 1647 10/17/17 0600  NA 132* 133* 133* 130* 133*  K 3.5 3.7 3.8 4.2 4.0  CL 98* 99* 99* 97* 99*  CO2 21* _0 GLUCOSE 159* 231* 158* 339* 134*  BUN _1 CREATININE 0.49* 0.54* 0.55* 0.61 0.57*  CALCIUM 8.3* 8.5* 8.5* 8.2* 8.2*   GFR: Estimated Creatinine Clearance: 78 mL/min (A) (by C-G formula based on SCr of 0.57 mg/dL (L)). Liver Function Tests: Recent Labs  Lab 10/10/17 1451 10/14/17 0422  AST 50* 26  ALT 27 16*  ALKPHOS 178* 142*  BILITOT 0.5 0.3  PROT 6.6 5.3*  ALBUMIN 2.4* 1.9*   No results for input(s): LIPASE, AMYLASE in the last 168 hours. Recent Labs  Lab 10/14/17 1415  AMMONIA 36*   Coagulation Profile: No results for input(s): INR, PROTIME in the last 168 hours. Cardiac Enzymes: No results for input(s): CKTOTAL, CKMB, CKMBINDEX, TROPONINI in the last 168 hours. BNP (last 3 results) No results for input(s): PROBNP in the last 8760 hours. HbA1C: No results for input(s): HGBA1C in the last 72 hours. CBG: Recent Labs  Lab 10/16/17 0836 10/16/17 1128 10/16/17 1720 10/16/17 2118 10/17/17 0741  GLUCAP 265* 207* 181* 155* 123*   Lipid Profile: No results for input(s): CHOL, HDL, LDLCALC, TRIG, CHOLHDL, LDLDIRECT in the last 72 hours. Thyroid Function Tests: No results for input(s): TSH, T4TOTAL, FREET4, T3FREE, THYROIDAB in the last 72 hours. Anemia Panel: No results for input(s): VITAMINB12, FOLATE, FERRITIN, TIBC, IRON, RETICCTPCT in the last 72 hours. Sepsis Labs: Recent Labs  Lab 10/10/17 1517 10/10/17 1646 10/10/17 1924 10/11/17 0538 10/12/17 0743  PROCALCITON  --   --  0.16 0.16 0.22  LATICACIDVEN 2.11* 2.05* 1.3  --   --     Recent Results (from the past 240  hour(s))  Culture, blood (routine x 2)     Status: None   Collection Time: 10/10/17  4:20 PM  Result Value Ref Range Status   Specimen Description BLOOD RIGHT HAND  Final   Special Requests   Final    BOTTLES DRAWN AEROBIC AND ANAEROBIC Blood Culture adequate volume   Culture   Final    NO GROWTH 5 DAYS Performed at [facility], [place] [address] [place], [place] [address]    Report Status 10/15/2017 FINAL  Final  Culture, blood (routine x 2)     Status: None   Collection Time: 10/10/17  4:35 PM  Result Value Ref Range Status   Specimen Description BLOOD RIGHT ANTECUBITAL  Final   Special Requests   Final    BOTTLES DRAWN AEROBIC AND ANAEROBIC Blood Culture adequate volume   Culture   Final  NO GROWTH 5 DAYS Performed at [facility], [place] [address] [place], [place] [address]    Report Status 10/15/2017 FINAL  Final  MRSA PCR Screening     Status: Abnormal   Collection Time: 10/10/17  8:32 PM  Result Value Ref Range Status   MRSA by PCR POSITIVE (A) NEGATIVE Final    Comment:        The GeneXpert MRSA Assay (FDA approved for NASAL specimens only), is one component of a comprehensive MRSA colonization surveillance program. It is not intended to diagnose MRSA infection nor to guide or monitor treatment for MRSA infections. RESULT CALLED TO, READ BACK BY AND VERIFIED WITH: MITZ,A RN 10/10/17 AT 2227 [facility]          Radiology Studies: US Scrotum Doppler  Result Date: 10/16/2017 CLINICAL DATA:  Testicular pain.  Drainage from scrotum. EXAM: SCROTAL ULTRASOUND DOPPLER ULTRASOUND OF THE TESTICLES TECHNIQUE: Complete ultrasound examination of the testicles, epididymis, and other scrotal structures was performed. Color and spectral Doppler ultrasound were also utilized to evaluate blood flow to the testicles. COMPARISON:  None. FINDINGS: Right testicle Measurements: 4.2 x 2.8 x 2.5 cm. The right testicle is mildly heterogeneous with no focal mass. Left testicle  Measurements: 3.2 x 2.5 x 2.9 cm. No mass or microlithiasis visualized. Right epididymis:  Normal in size and appearance. Left epididymis:  Normal in size and appearance. Hydrocele:  Small bilateral hydroceles. Varicocele:  None visualized. Pulsed Doppler interrogation of both testes demonstrates skin thickening in the scrotum. IMPRESSION: 1. The right testicle is mildly heterogeneous in echotexture with no focal mass. The mild heterogeneity is nonspecific. No evidence of abscess. Normal blood flow identified in the right testicle. 2. The left testicle is normal in appearance. 3. Normal bilateral testicular blood flow. No evidence of torsion on this study. 4. Scrotal skin thickening consistent with history. Electronically Signed   By: Dorise Bullion III M.D   On: 10/16/2017 20:27        Scheduled Meds: . amLODipine  5 mg Oral Daily  . aspirin EC  325 mg Oral Daily  . atorvastatin  20 mg Oral q1800  . bisacodyl  10 mg Rectal Once  . docusate sodium  100 mg Oral BID  . feeding supplement (ENSURE ENLIVE)  237 mL Oral Q24H  . feeding supplement (GLUCERNA SHAKE)  237 mL Oral BID BM  . ferrous sulfate  325 mg Oral BID WC  . gabapentin  100 mg Oral TID  . insulin aspart  0-5 Units Subcutaneous QHS  . insulin aspart  0-9 Units Subcutaneous TID WC  . insulin aspart  3 Units Subcutaneous TID WC  . insulin glargine  20 Units Subcutaneous Daily  . metoprolol succinate  50 mg Oral Daily  . metroNIDAZOLE  500 mg Oral Q8H  . multivitamin with minerals  1 tablet Oral Daily  . nicotine  21 mg Transdermal QHS  . polyethylene glycol  17 g Oral Daily  . senna-docusate  1 tablet Oral BID  . sodium chloride flush  3 mL Intravenous Q12H  . thiamine  100 mg Oral Daily   Continuous Infusions: . sodium chloride 75 mL/hr at 10/16/17 2116  . sodium chloride 10 mL/hr at 10/16/17 1000  . ceFEPime (MAXIPIME) IV 2 g (10/17/17 0925)  . methocarbamol (ROBAXIN)  IV    . vancomycin 750 mg (10/17/17 0925)     LOS:  7 days    Time spent: 35 minutes.,     Andrew A  Tyrell Antonio, MD [facility] Pager [phone]  If 7PM-7AM, please contact night-coverage [email or web address] Password [facility] 10/17/2017, 11:43 AM

## 2017-10-18 LAB — CBC
HCT: 26.5 % — ABNORMAL LOW (ref 39.0–52.0)
Hemoglobin: 8.5 g/dL — ABNORMAL LOW (ref 13.0–17.0)
MCH: 27.2 pg (ref 26.0–34.0)
MCHC: 32.1 g/dL (ref 30.0–36.0)
MCV: 84.7 fL (ref 78.0–100.0)
PLATELETS: 779 10*3/uL — AB (ref 150–400)
RBC: 3.13 MIL/uL — AB (ref 4.22–5.81)
RDW: 15.8 % — ABNORMAL HIGH (ref 11.5–15.5)
WBC: 14 10*3/uL — AB (ref 4.0–10.5)

## 2017-10-18 LAB — IRON AND TIBC
IRON: 16 ug/dL — AB (ref 45–182)
Saturation Ratios: 14 % — ABNORMAL LOW (ref 17.9–39.5)
TIBC: 118 ug/dL — AB (ref 250–450)
UIBC: 102 ug/dL

## 2017-10-18 LAB — RETICULOCYTES
RBC.: 3.13 MIL/uL — ABNORMAL LOW (ref 4.22–5.81)
RETIC CT PCT: 3.5 % — AB (ref 0.4–3.1)
Retic Count, Absolute: 109.6 10*3/uL (ref 19.0–186.0)

## 2017-10-18 LAB — FERRITIN: Ferritin: 312 ng/mL (ref 24–336)

## 2017-10-18 LAB — VITAMIN B12: VITAMIN B 12: 463 pg/mL (ref 180–914)

## 2017-10-18 LAB — FOLATE: Folate: 18.9 ng/mL (ref >5.9)

## 2017-10-18 LAB — GLUCOSE, CAPILLARY
GLUCOSE-CAPILLARY: 120 mg/dL — AB (ref 65–99)
Glucose-Capillary: 228 mg/dL — ABNORMAL HIGH (ref 65–99)
Glucose-Capillary: 232 mg/dL — ABNORMAL HIGH (ref 65–99)
Glucose-Capillary: 94 mg/dL (ref 65–99)

## 2017-10-18 MED ORDER — FLUCONAZOLE 100MG IVPB
100.0000 mg | INTRAVENOUS | Status: DC
  Administered 2017-10-18: 100 mg via INTRAVENOUS
  Filled 2017-10-18 (×2): qty 50

## 2017-10-18 MED ORDER — ENSURE ENLIVE PO LIQD
237.0000 mL | Freq: Three times a day (TID) | ORAL | Status: DC
  Administered 2017-10-18 – 2017-10-21 (×7): 237 mL via ORAL

## 2017-10-18 MED ORDER — NYSTATIN 100000 UNIT/ML MT SUSP
5.0000 mL | Freq: Four times a day (QID) | OROMUCOSAL | Status: DC
  Administered 2017-10-18 – 2017-10-21 (×11): 500000 [IU] via ORAL
  Filled 2017-10-18 (×12): qty 5

## 2017-10-18 MED ORDER — SULFAMETHOXAZOLE-TRIMETHOPRIM 800-160 MG PO TABS
1.0000 | ORAL_TABLET | Freq: Two times a day (BID) | ORAL | Status: DC
  Administered 2017-10-18 – 2017-10-21 (×7): 1 via ORAL
  Filled 2017-10-18 (×7): qty 1

## 2017-10-18 NOTE — Progress Notes (Signed)
Patient ID: Andrew [name], male   DOB: 12/30/1945, 72 y.o.   MRN: [id number] Postoperative day 2 right above-the-knee amputation.  Patient states he does have pain symptomatically he is better than it was preoperatively.  There is minimal drainage in the wound VAC canister.  Anticipate patient can be discharged to skilled nursing he would need the portable Praveena wound VAC for 1 week at discharge.

## 2017-10-18 NOTE — Progress Notes (Signed)
Nutrition Follow-up  DOCUMENTATION CODES:   Non-severe (moderate) malnutrition in context of social or environmental circumstances  INTERVENTION:   -Increase Ensure Enlive po to TID, each supplement provides 350 kcal and 20 grams of protein  -D/C Glucerna shakes  -Continue MVI   NUTRITION DIAGNOSIS:   Moderate Malnutrition related to social / environmental circumstances as evidenced by moderate muscle depletion, moderate fat depletion.  Continues but being addressed as increasing supplements  GOAL:   Patient will meet greater than or equal to 90% of their needs  Not met  MONITOR:   PO intake, Supplement acceptance, Weight trends, Skin, I & O's, Labs  REASON FOR ASSESSMENT:   Consult Wound healing  ASSESSMENT:   Pt with PMH of HTN, HLD, type II DM, diabetic neuropathy, R eye blindness, with multiple recurrent wounds presents with cellulitis of RLE and sepsis r/t wet gangrene/eschar/necrosis   3/2 S/p right transfemoral amputation with wound vac placement  Noted pt refused breakfast and lunch on 3/2. On visit today, pt did not eat breakfast and ate bites of lunch tray. Pt drank 100% of Glucerna shake this AM but when asking pt about Glucerna, pt states "I don't know what you are talking about."  Discussed the nutritional supplements and possible addition of snacks to increase nutritional intake and pt states "it doesn't matter."  Noted pt refusing care at times, also being verbally abusive and threatening to hit staff.   Noted last BM 2/28; pt with miralax, senna-docusate ordered  Labs: reviewed Meds: MVI, lantus, novolog with meals, ss novolog   Diet Order:  Diet regular Room service appropriate? Yes; Fluid consistency: Thin  EDUCATION NEEDS:   Not appropriate for education at this time  Skin:  Skin Assessment: Skin Integrity Issues: Skin Integrity Issues:: Other (Comment) Other: wet gangrene at RLE and multiple other wounds  Last BM:  2/28  Height:    Ht Readings from Last 1 Encounters:  10/10/17 _0  (1.702 m)    Weight:   Wt Readings from Last 1 Encounters:  10/17/17 171 lb 1.2 oz (77.6 kg)    Ideal Body Weight:  67.3 kg  BMI:  Body mass index is 26.79 kg/m.  Estimated Nutritional Needs:   Kcal:  1800-2000  Protein:  100-115 grams  Fluid:  >/= 1.8 L/d   Kerman Passey MS, RD, LDN, CNSC [phone] Pager  [phone] Weekend/On-Call Pager

## 2017-10-18 NOTE — Progress Notes (Addendum)
PROGRESS NOTE    Andrew [name]  RCB:[id number] DOB: October 31, 1945 DOA: 10/10/2017 PCP: Christain Sacramento, MD    Brief Narrative: Andrew Lines Parrishis a 72 y.o.malewith medical history significant forType 2 DM, HTN, HLD, R eye blindness, tobacco abuse presents to the ED c/o worsening RLE cellulitis. Pt reported he sustained a wound to the foot about 3 weeks ago, that gradually spread to his lower leg. Pt reported his PCP prescribed PO keflex for 10 days of which he was compliant to, but didn't help with the infection. Pt denies any fever/chills, nausea/vomiting, chest pain, SOB, abdominal pain. Of note, pt has had recurrent cellulitis with very poor wound healing. Last R arm infection, required a skin graft. In the ED, noted was a very foul smelling/wet gangrene/eschar on the ant lower leg to dorsum of foot. Surrounding erythema noted. Pt was noted to be septic and admitted for further management.   Assessment & Plan:   Principal Problem:   Cellulitis Active Problems:   Hyperlipidemia   Tobacco abuse disorder   Essential hypertension   Diabetic autonomic neuropathy associated with type 2 diabetes mellitus (HCC)   Legal blindness   Atherosclerosis of native arteries of extremities with gangrene, right leg (HCC)   Goals of care, counseling/discussion   Palliative care encounter   Malnutrition of moderate degree  Sepsis due to right leg cellulitis complicated by gangrene/eschar/necrosis Elevated WBC 30s, elevated LA on admission ESR 92,  CRP 13.5, pre-albumin <5 ABI right and left 0.9---0.9 Plastic surgery consulted, rec amputation, not a good candidate for Acell/VAC treatment Dr Sharol Given recommend Amputation.  Treated with IV vancomycin, cefepime for 8 days.  WBC trending down. 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 Has wound vac. Follow ortho recommendations. Needs wound vac for 1 week.  Change antibiotics to Bactrim, to cover for scrotal infection.   Scrotal infection ?  Per nurse  documentation patient had drainage from scrotum. Continue with IV antibiotics. [place] no abscess.  Discussed case with urology, recommend Bactrim for  for 5 days.   Anemia; Iron deficiency anemia  Anemia panel. Iron deficiency.  Hb stable at 8 Started ferrous sulfate.   DM type 2;  Continue with lantus. 20 units.  SSI and  meals coverage.   Transaminases; improved. Ammonia not significant elevated 36.  HLD; on Zocor.   HTN; Continue with Norvasc, metoprolol.   Tobacco abuse Advised to quit Nicotine patch ordered  Hyponatremia; IV fluids.   Constipation; on miralax and senokot. Refuse suppository.  Oral candidiasis. Start nystatin. Fuconazole.   DVT prophylaxis: SCD Code Status: DNR Family Communication: care discussed with patient  Disposition Plan: Needs SNF   Consultants:   Dr Sharol Given.    Procedures:   ABI   Antimicrobials:  Vancomycin, cefepime, flagyl 2-25  Subjective: No BM yet. Denies chest pain or dyspnea.  Mild pain at sx site.    Objective: Vitals:   10/17/17 2024 10/17/17 2219 10/18/17 0522 10/18/17 0754  BP: (!) 145/69  (!) 149/57 (!) 144/56  Pulse: 92  82 78  Resp: 18  18 16   Temp: 99.1 F (37.3 C)  98.4 F (36.9 C) 98.6 F (37 C)  TempSrc: Oral  Oral Oral  SpO2: 97%  96% 96%  Weight:  77.6 kg (171 lb 1.2 oz)    Height:        Intake/Output Summary (Last 24 hours) at 10/18/2017 1324 Last data filed at 10/18/2017 1253 Gross per 24 hour  Intake 3509.17 ml  Output 925 ml  Net 2584.Lockhart  ml   Filed Weights   10/13/17 2135 10/15/17 2037 10/17/17 2219  Weight: 72.6 kg (160 lb 1 oz) 77.5 kg (170 lb 13.7 oz) 77.6 kg (171 lb 1.2 oz)    Examination:  General exam: NAD, chronic ill appearing.  Respiratory system: Normal respiratory effort. CTA Cardiovascular system: S 1, S 2 RRR Gastrointestinal system: BS present, soft, nt Central nervous system: Alert.  Extremities: S/P AKA, wound vac in place.  GU; scrotum, no significant redness, mild  localized skin thickening redness,     Data Reviewed: I have personally reviewed following labs and imaging studies  CBC: Recent Labs  Lab 10/13/17 0512 10/14/17 0422 10/15/17 0437 10/16/17 0632 10/16/17 1601 10/17/17 0600 10/18/17 0511  WBC 26.9* 24.8* 20.9* 22.2* 19.6* 16.0* 14.0*  NEUTROABS 22.8* 20.4* 16.3* 18.2*  --  12.3*  --   HGB 10.0* 9.9* 9.3* 9.5* 8.2* 8.2* 8.5*  HCT 30.1* 30.0* 28.9* 29.0* 25.6* 25.6* 26.5*  MCV 84.8 83.6 83.8 84.1 84.2 85.6 84.7  PLT 737* 752* 764* 717* 700* 732* 564*   Basic Metabolic Panel: Recent Labs  Lab 10/13/17 0512 10/14/17 0422 10/15/17 0437 10/15/17 1647 10/17/17 0600  NA 132* 133* 133* 130* 133*  K 3.5 3.7 3.8 4.2 4.0  CL 98* 99* 99* 97* 99*  CO2 21* 25 26 25 26   GLUCOSE 159* 231* 158* 339* 134*  BUN 7 9 9 13 8   CREATININE 0.49* 0.54* 0.55* 0.61 0.57*  CALCIUM 8.3* 8.5* 8.5* 8.2* 8.2*   GFR: Estimated Creatinine Clearance: 78 mL/min (A) (by C-G formula based on SCr of 0.57 mg/dL (L)). Liver Function Tests: Recent Labs  Lab 10/14/17 0422  AST 26  ALT 16*  ALKPHOS 142*  BILITOT 0.3  PROT 5.3*  ALBUMIN 1.9*   No results for input(s): LIPASE, AMYLASE in the last 168 hours. Recent Labs  Lab 10/14/17 1415  AMMONIA 36*   Coagulation Profile: No results for input(s): INR, PROTIME in the last 168 hours. Cardiac Enzymes: No results for input(s): CKTOTAL, CKMB, CKMBINDEX, TROPONINI in the last 168 hours. BNP (last 3 results) No results for input(s): PROBNP in the last 8760 hours. HbA1C: No results for input(s): HGBA1C in the last 72 hours. CBG: Recent Labs  Lab 10/17/17 1144 10/17/17 1644 10/17/17 2215 10/18/17 0734 10/18/17 1154  GLUCAP 170* 169* 171* 120* 228*   Lipid Profile: No results for input(s): CHOL, HDL, LDLCALC, TRIG, CHOLHDL, LDLDIRECT in the last 72 hours. Thyroid Function Tests: No results for input(s): TSH, T4TOTAL, FREET4, T3FREE, THYROIDAB in the last 72 hours. Anemia Panel: Recent Labs     10/18/17 0511  VITAMINB12 463  FOLATE 18.9  FERRITIN 312  TIBC 118*  IRON 16*  RETICCTPCT 3.5*   Sepsis Labs: Recent Labs  Lab 10/12/17 0743  PROCALCITON 0.22    Recent Results (from the past 240 hour(s))  Culture, blood (routine x 2)     Status: None   Collection Time: 10/10/17  4:20 PM  Result Value Ref Range Status   Specimen Description BLOOD RIGHT HAND  Final   Special Requests   Final    BOTTLES DRAWN AEROBIC AND ANAEROBIC Blood Culture adequate volume   Culture   Final    NO GROWTH 5 DAYS Performed at [facility], [place] [address] [place], [place] [address]    Report Status 10/15/2017 FINAL  Final  Culture, blood (routine x 2)     Status: None   Collection Time: 10/10/17  4:35 PM  Result Value  Ref Range Status   Specimen Description BLOOD RIGHT ANTECUBITAL  Final   Special Requests   Final    BOTTLES DRAWN AEROBIC AND ANAEROBIC Blood Culture adequate volume   Culture   Final    NO GROWTH 5 DAYS Performed at [facility], [address] [place], [place] [address]    Report Status 10/15/2017 FINAL  Final  MRSA PCR Screening     Status: Abnormal   Collection Time: 10/10/17  8:32 PM  Result Value Ref Range Status   MRSA by PCR POSITIVE (A) NEGATIVE Final    Comment:        The GeneXpert MRSA Assay (FDA approved for NASAL specimens only), is one component of a comprehensive MRSA colonization surveillance program. It is not intended to diagnose MRSA infection nor to guide or monitor treatment for MRSA infections. RESULT CALLED TO, READ BACK BY AND VERIFIED WITH: MITZ,A RN 10/10/17 AT 2227 [facility]          Radiology Studies: US Scrotum Doppler  Result Date: 10/16/2017 CLINICAL DATA:  Testicular pain.  Drainage from scrotum. EXAM: SCROTAL ULTRASOUND DOPPLER ULTRASOUND OF THE TESTICLES TECHNIQUE: Complete ultrasound examination of the testicles, epididymis, and other scrotal structures was performed. Color and spectral Doppler ultrasound  were also utilized to evaluate blood flow to the testicles. COMPARISON:  None. FINDINGS: Right testicle Measurements: 4.2 x 2.8 x 2.5 cm. The right testicle is mildly heterogeneous with no focal mass. Left testicle Measurements: 3.2 x 2.5 x 2.9 cm. No mass or microlithiasis visualized. Right epididymis:  Normal in size and appearance. Left epididymis:  Normal in size and appearance. Hydrocele:  Small bilateral hydroceles. Varicocele:  None visualized. Pulsed Doppler interrogation of both testes demonstrates skin thickening in the scrotum. IMPRESSION: 1. The right testicle is mildly heterogeneous in echotexture with no focal mass. The mild heterogeneity is nonspecific. No evidence of abscess. Normal blood flow identified in the right testicle. 2. The left testicle is normal in appearance. 3. Normal bilateral testicular blood flow. No evidence of torsion on this study. 4. Scrotal skin thickening consistent with history. Electronically Signed   By: Dorise Bullion III M.D   On: 10/16/2017 20:27        Scheduled Meds: . amLODipine  5 mg Oral Daily  . aspirin EC  325 mg Oral Daily  . atorvastatin  20 mg Oral q1800  . bisacodyl  10 mg Rectal Once  . docusate sodium  100 mg Oral BID  . feeding supplement (ENSURE ENLIVE)  237 mL Oral Q24H  . feeding supplement (GLUCERNA SHAKE)  237 mL Oral BID BM  . ferrous sulfate  325 mg Oral BID WC  . gabapentin  100 mg Oral TID  . insulin aspart  0-5 Units Subcutaneous QHS  . insulin aspart  0-9 Units Subcutaneous TID WC  . insulin aspart  3 Units Subcutaneous TID WC  . insulin glargine  20 Units Subcutaneous Daily  . metoprolol succinate  50 mg Oral Daily  . multivitamin with minerals  1 tablet Oral Daily  . nicotine  21 mg Transdermal QHS  . nystatin  5 mL Oral QID  . polyethylene glycol  17 g Oral Daily  . senna-docusate  1 tablet Oral BID  . sodium chloride flush  3 mL Intravenous Q12H  . sulfamethoxazole-trimethoprim  1 tablet Oral Q12H  . thiamine  100  mg Oral Daily   Continuous Infusions: . sodium chloride Stopped (10/17/17 2107)  . sodium chloride 10 mL/hr at  10/17/17 2110  . fluconazole (DIFLUCAN) IV    . methocarbamol (ROBAXIN)  IV       LOS: 8 days    Time spent: 35 minutes.,     Elmarie Shiley, MD [facility] Pager [phone]  If 7PM-7AM, please contact night-coverage [email or web address] Password [email or web address] 10/18/2017, 1:24 PM

## 2017-10-18 NOTE — Progress Notes (Signed)
Patient is noncompliant with care. This morning patient was soaked with urine and refusing to let staff clean him up. Patient is verbally abusive towards staff threatening to hit this nurse. Patient is noncompliant with medication regimen and positioning. Patient has been given prn pain medication Morphine and Hydrocodone but states his pain is still a 10 and that the medicine doesn't help. Patient did have a large formed BM today using the bedpan. Will continue to monitor.

## 2017-10-19 DIAGNOSIS — Z89611 Acquired absence of right leg above knee: Secondary | ICD-10-CM

## 2017-10-19 LAB — GLUCOSE, CAPILLARY
GLUCOSE-CAPILLARY: 167 mg/dL — AB (ref 65–99)
GLUCOSE-CAPILLARY: 234 mg/dL — AB (ref 65–99)
GLUCOSE-CAPILLARY: 345 mg/dL — AB (ref 65–99)
Glucose-Capillary: 372 mg/dL — ABNORMAL HIGH (ref 65–99)

## 2017-10-19 LAB — BASIC METABOLIC PANEL
ANION GAP: 8 (ref 5–15)
BUN: 9 mg/dL (ref 6–20)
CALCIUM: 8.3 mg/dL — AB (ref 8.9–10.3)
CO2: 27 mmol/L (ref 22–32)
Chloride: 99 mmol/L — ABNORMAL LOW (ref 101–111)
Creatinine, Ser: 0.44 mg/dL — ABNORMAL LOW (ref 0.61–1.24)
Glucose, Bld: 172 mg/dL — ABNORMAL HIGH (ref 65–99)
POTASSIUM: 4.5 mmol/L (ref 3.5–5.1)
Sodium: 134 mmol/L — ABNORMAL LOW (ref 135–145)

## 2017-10-19 LAB — CBC
HCT: 27.7 % — ABNORMAL LOW (ref 39.0–52.0)
Hemoglobin: 9 g/dL — ABNORMAL LOW (ref 13.0–17.0)
MCH: 27.4 pg (ref 26.0–34.0)
MCHC: 32.5 g/dL (ref 30.0–36.0)
MCV: 84.5 fL (ref 78.0–100.0)
PLATELETS: 731 10*3/uL — AB (ref 150–400)
RBC: 3.28 MIL/uL — AB (ref 4.22–5.81)
RDW: 15.7 % — AB (ref 11.5–15.5)
WBC: 12.2 10*3/uL — ABNORMAL HIGH (ref 4.0–10.5)

## 2017-10-19 MED ORDER — FLUCONAZOLE 100 MG PO TABS
100.0000 mg | ORAL_TABLET | Freq: Every day | ORAL | Status: DC
  Administered 2017-10-19 – 2017-10-21 (×3): 100 mg via ORAL
  Filled 2017-10-19 (×3): qty 1

## 2017-10-19 MED ORDER — FLUCONAZOLE 100 MG PO TABS: 100.0000 mg | ORAL_TABLET | Freq: Every day | ORAL | Status: DC

## 2017-10-19 NOTE — Progress Notes (Signed)
Physical Therapy Treatment Patient Details Name: Andrew [name] MRN: [id number] DOB: 1946/06/26 Today's Date: 10/19/2017    History of Present Illness 72 yo male with onset of gangrene R leg was admitted for AK amputation with issues healing wounds in the past, including needing a skin graft for RUE infection.  Poor results with ABT as well. Had sepsis with cellulitis RLE.   Has history of DM, tobacco abuse and PN complicating his issues as well.      PT Comments    Very brief session in which PT attempted to get pt to sit up in chair and he objected as the RLE has pain with movement.  Talked with him about the means to get the R leg moved using bed pad to protect it, and he flatly refused it.  Worked on repositioning in the bed for comfort, and pt finally asked PT to stop as he is not willing to move at all.  Refused exercises but did talk with him about why the PT's don't just wait until he is painless to begin.  Talked about the decline of function with that kind of delay.  Pt is expected to DC to SNF for rehab.  Wound vac in place.   Follow Up Recommendations  SNF     Equipment Recommendations  Wheelchair (measurements PT)    Recommendations for Other Services       Precautions / Restrictions Precautions Precautions: Fall Precaution Comments: wound vac R stump Restrictions Weight Bearing Restrictions: Yes    Mobility  Bed Mobility Overal bed mobility: Needs Assistance Bed Mobility: Rolling Rolling: Max assist         General bed mobility comments: Worked on sitting more upright at which point pt became upset with PT and stated he could not tolerate more mobility  Transfers                 General transfer comment: refused to attempt  Ambulation/Gait                 Stairs            Wheelchair Mobility    Modified Rankin (Stroke Patients Only)       Balance Overall balance assessment: History of Falls                                           Cognition Arousal/Alertness: Awake/alert Behavior During Therapy: Flat affect Overall Cognitive Status: No family/caregiver present to determine baseline cognitive functioning                                        Exercises      General Comments General comments (skin integrity, edema, etc.): wound vac in R stump with no issues      Pertinent Vitals/Pain Pain Assessment: Faces Faces Pain Scale: Hurts whole lot Pain Location: operated leg Pain Descriptors / Indicators: Operative site guarding;Tender Pain Intervention(s): Limited activity within patient's tolerance;Monitored during session;Premedicated before session;Repositioned    Home Living                      Prior Function            PT Goals (current goals can now be found in the care plan section) Acute Rehab  PT Goals Patient Stated Goal: home Progress towards PT goals: Not progressing toward goals - comment    Frequency    Min 3X/week      PT Plan Current plan remains appropriate    Co-evaluation              AM-PAC PT "6 Clicks" Daily Activity  Outcome Measure  Difficulty turning over in bed (including adjusting bedclothes, sheets and blankets)?: Unable Difficulty moving from lying on back to sitting on the side of the bed? : Unable Difficulty sitting down on and standing up from a chair with arms (e.g., wheelchair, bedside commode, etc,.)?: Unable Help needed moving to and from a bed to chair (including a wheelchair)?: Total Help needed walking in hospital room?: Total Help needed climbing 3-5 steps with a railing? : Total 6 Click Score: 6    End of Session   Activity Tolerance: Patient limited by pain Patient left: in bed;with call bell/phone within reach;with bed alarm set Nurse Communication: Mobility status PT Visit Diagnosis: Other abnormalities of gait and mobility (R26.89);History of falling (Z91.81);Pain Pain - Right/Left:  Right Pain - part of body: Leg     Time: 1225-1233 PT Time Calculation (min) (ACUTE ONLY): 8 min  Charges:  $Therapeutic Activity: 8-22 mins                    G Codes:  Functional Assessment Tool Used: AM-PAC 6 Clicks Basic Mobility     [name] 10/19/2017, 1:06 PM   [name], PT MS Acute Rehab Dept. Number: [place] [facility] [place]

## 2017-10-19 NOTE — Evaluation (Signed)
Occupational Therapy Evaluation Patient Details Name: Andrew [name] MRN: [id number] DOB: September 10, 1945 Today's Date: 10/19/2017    History of Present Illness 72 yo male with onset of gangrene R leg was admitted for AK amputation with issues healing wounds in the past, including needing a skin graft for RUE infection.  Poor results with ABT as well. Had sepsis with cellulitis RLE.   Has history of DM, tobacco abuse and PN complicating his issues as well.     Clinical Impression   Eval limited due to pt's reports of R residual limb pain, RN gave pain meds ~ 1 hour before OT arrival. Pt a poor historian and uncertain if PLOF reports in accurate. Pt resistive to participation, not very motivated and required max encouragment for minimal activity, reports pain as reason however RN states pt had vicodin recently. Educated pt on participating with therapy as pain will most likley consistently be present at this time, however is being managed by pain meds. Pt with significant impairment sin strength, balance and endurance. Pt would benefit from acute OT services to address impairments to maximize level of function and safety. Recommend d/c to SNF for further rehab intervention before d/c home    Follow Up Recommendations  SNF;Supervision/Assistance - 24 hour    Equipment Recommendations  None recommended by OT;Other (comment)(TBD at next venue of care)    Recommendations for Other Services       Precautions / Restrictions Precautions Precautions: Fall Precaution Comments: wound vac R stump Required Braces or Orthoses: (wound vac) Restrictions Weight Bearing Restrictions: Yes RLE Weight Bearing: Non weight bearing      Mobility Bed Mobility Overal bed mobility: Needs Assistance Bed Mobility: Rolling Rolling: Max assist         General bed mobility comments: pt refused to attemtp sitting EOB, became agitated with therapist and also refused to be positioned properly with HOB raised and  trunk in midline  to eat his lunch in bed  Transfers                 General transfer comment: refused to attempt    Balance Overall balance assessment: History of Falls                                         ADL either performed or assessed with clinical judgement   ADL Overall ADL's : Needs assistance/impaired Eating/Feeding: Minimal assistance Eating/Feeding Details (indicate cue type and reason): pt refused to be positioned properly to eat his lunch in bed Grooming: Wash/dry hands;Wash/dry face;Bed level;Set up   Upper Body Bathing: Maximal assistance   Lower Body Bathing: Total assistance   Upper Body Dressing : Maximal assistance   Lower Body Dressing: Total assistance     Toilet Transfer Details (indicate cue type and reason): NT Toileting- Clothing Manipulation and Hygiene: Total assistance         General ADL Comments: pt not very motivated and required max encouragment for minimal activity, reports pain as reason however RN states pt had vicodin recently. Educated pt on participating with therapy as pain will most likley consistently be present at this time, however is being managed by pain meds     Vision Baseline Vision/History: (R eye blindness) Patient Visual Report: No change from baseline       Perception     Praxis      Pertinent Vitals/Pain Pain Assessment: 0-10  Pain Score: 8  Faces Pain Scale: Hurts whole lot Pain Location: operated leg Pain Descriptors / Indicators: Operative site guarding;Tender Pain Intervention(s): Limited activity within patient's tolerance;Monitored during session;Premedicated before session;Repositioned     Hand Dominance Left   Extremity/Trunk Assessment Upper Extremity Assessment Upper Extremity Assessment: Generalized weakness;RUE deficits/detail RUE Deficits / Details: impaired AROM in shoulder flexion, elbow extension   Lower Extremity Assessment Lower Extremity Assessment: Defer to  PT evaluation       Communication Communication Communication: No difficulties   Cognition Arousal/Alertness: Awake/alert Behavior During Therapy: Flat affect Overall Cognitive Status: No family/caregiver present to determine baseline cognitive functioning                                 General Comments: as above, unreliable and inconsistent historian   General Comments  wound vac in R stump with no issues    Exercises Exercises: (declined to attempt)   Shoulder Instructions      Home Living Family/patient expects to be discharged to:: Private residence Living Arrangements: Alone;Non-relatives/Friends Available Help at Discharge: Friend(s);Available PRN/intermittently;Neighbor Type of Home: Mobile home Home Access: Stairs to enter [employer] of Steps: 3 Entrance Stairs-Rails: Can reach both Home Layout: One level     Bathroom Shower/Tub: Teacher, early years/pre: Standard Bathroom Accessibility: Yes   Home Equipment: Wheelchair - manual   Additional Comments: unsure of accuracy of information as pt reticent to participate.  Unable to connect new surgery/AKA and stairs at home, as pt expects to go home.  Says he's alone, though prior notes indicate he has children to help.       Prior Functioning/Environment Level of Independence: Needs assistance  Gait / Transfers Assistance Needed: prior history of falls, now will be w/c bound at least for short term ADL's / Homemaking Assistance Needed: per previous notes, family assist with meal and bathing   Comments: generally unreliable historian; pt tends to stop communicating when pressed for information        OT Problem List: Decreased strength;Decreased activity tolerance;Decreased cognition;Impaired vision/perception;Decreased knowledge of use of DME or AE;Impaired UE functional use;Decreased range of motion;Impaired balance (sitting and/or standing);Decreased coordination;Decreased  knowledge of precautions;Impaired sensation;Pain      OT Treatment/Interventions: Self-care/ADL training;DME and/or AE instruction;Therapeutic activities;Therapeutic exercise;Patient/family education    OT Goals(Current goals can be found in the care plan section) Acute Rehab OT Goals Patient Stated Goal: home OT Goal Formulation: With patient Time For Goal Achievement: 11/02/17 Potential to Achieve Goals: Good ADL Goals Pt Will Perform Grooming: with min guard assist;sitting Pt Will Perform Upper Body Bathing: sitting;with mod assist Pt Will Perform Lower Body Bathing: with max assist;with mod assist;sitting/lateral leans Pt Will Perform Upper Body Dressing: with mod assist;sitting Pt Will Transfer to Toilet: with max assist;with +2 assist;squat pivot transfer;stand pivot transfer;anterior/posterior transfer Additional ADL Goal #1: Pt will complete bed mobility with mod A to sit EOB for selfcare tasks  OT Frequency: Min 2X/week   Barriers to D/C: Decreased caregiver support          Co-evaluation              AM-PAC PT "6 Clicks" Daily Activity     Outcome Measure Help from another person eating meals?: A Little Help from another person taking care of personal grooming?: A Little Help from another person toileting, which includes using toliet, bedpan, or urinal?: Total Help from another person bathing (including washing,  rinsing, drying)?: Total Help from another person to put on and taking off regular upper body clothing?: A Lot Help from another person to put on and taking off regular lower body clothing?: Total 6 Click Score: 11   End of Session Nurse Communication: Mobility status  Activity Tolerance: Patient limited by pain Patient left: in bed  OT Visit Diagnosis: History of falling (Z91.81);Muscle weakness (generalized) (M62.81);Other abnormalities of gait and mobility (R26.89);Pain Pain - Right/Left: Right Pain - part of body: Leg                Time:  1316-1340 OT Time Calculation (min): 24 min Charges:  OT General Charges $OT Visit: 1 Visit OT Evaluation $OT Eval Moderate Complexity: 1 Mod G-Codes: OT G-codes **NOT FOR INPATIENT CLASS** Functional Assessment Tool Used: AM-PAC 6 Clicks Daily Activity      Britt Bottom 10/19/2017, 2:20 PM

## 2017-10-19 NOTE — Clinical Social Work Note (Signed)
Clinical Social Work Assessment  Patient Details  Name: Andrew [name] MRN: [id number] Date of Birth: 02/02/1946  Date of referral:  10/18/17(Also received consult 3/2)               Reason for consult:  Facility Placement, Discharge Planning                Permission sought to share information with:  Family Supports Permission granted to share information::  Yes, Verbal Permission Granted  Name::     Mariea Clonts  Agency::     Relationship::  Daughter  Contact Information:  [phone]  Housing/Transportation Living arrangements for the past 2 months:  [place] of Information:  Adult Children Patient Interpreter Needed:  None Criminal Activity/Legal Involvement Pertinent to Current Situation/Hospitalization:  No - Comment as needed Significant Relationships:  Adult Children, Friend Lives with:  Adult Children(Patient's son Gari Hartsell lives in the home) Do you feel safe going back to the place where you live?  No(Daughter in agreement with ST rehab for her dad) Need for family participation in patient care:  Yes (Comment)  Care giving concerns: Daughter expressed agreement with ST rehab and indicated that her brother lives with her dad and works part-time.   Social Worker assessment / plan: CSW talked with patient on 3/4 (5:20 pm) regarding discharge disposition and recommendation of ST rehab. Patient was sitting up in bed and his dinner was in front of him, however he had not started eating. Patient was agreeable to talking with CSW and discussion ensued regarding SNF placement, however he did not express agreement or disagreement with ST rehab. Mr. Plog gave CSW permission to talk with his daughter Jeanett Schlein and reported that he has a son and 2 grandchildren (Jeanette's children, ages 64 and 76). Mr. Litzinger also informed CSW that his daughter works in a Theatre manager. CSW attempted to reach daughter Jeanett Schlein at 5:32 pm and left a message.  CSW talked  with daughter Jeanett Schlein on 3/5 (8:51 am) regarding discharge disposition for her dad and recommendation of ST rehab. Daughter's preference is [facility]. When asked, daughter reported that her brother Crystal Ellwood lives with their dad. She added that her brother works part-time. Daughter indicated that before hospitalization her dad used a walker to get around as he was experiencing weakness in his legs. Ms. Constance Goltz provided CSW with her e-mail address ([email or web address]) so that she could be provided with SNF lists.  Employment status:  Retired Forensic scientist:  Managed Medicare(BCBS) PT Recommendations:  [place] / Referral to community resources:  Skilled Nursing Facility(Daughter emailed skilled facility list)  Patient/Family's Response to care:  No concerns expressed by patient or daughter to CSW regarding care during hospitalization.  Patient/Family's Understanding of and Emotional Response to Diagnosis, Current Treatment, and Prognosis:  Daughter expressed understanding regarding patient's need for rehab. CSW not sure if patient understood CSW's explanation of his need for ST rehab.  Emotional Assessment Appearance:  Appears stated age Attitude/Demeanor/Rapport:  Other(Appropriate) Affect (typically observed):  Appropriate Orientation:  Oriented to Self, Oriented to Place, Oriented to  Time, Oriented to Situation Alcohol / Substance use:  Tobacco Use, Alcohol Use, Illicit Drugs(Patient reported that he currently smokes, quit using chewing tobacco, quit drinking in 2015 and does not use illicit drugs) Psych involvement (Current and /or in the community):  No (Comment)  Discharge Needs  Concerns to be addressed:  Discharge Planning Concerns Readmission within the last 30 days:  No Current discharge risk:  None Barriers to Discharge:  Insurance authorization, communicating with daughter. CSW texted daughter several times on 3/5 and daughter  responded as she could. CSW expressed in texts to daughter the need to talk with her about facilities that will accept patient prior to insurance authorization and also requested her to call me after talking with [facility], one of her facility choices. Her first choice - [facility], could not take patient today.   Sable Feil, LCSW 10/19/2017, 6:14 PM

## 2017-10-19 NOTE — Progress Notes (Signed)
Palliative:  Andrew [name] is sleeping when I come in. He mostly grunts in response to me. Has been more cooperative today per nursing but refused OT today. I spoke with him about working with therapy and that it will be uncomfortable at first but he is going to be uncomfortable if he just lies in bed as well and the longer he refuses the worse it will be for him. He nods his head. I reiterated the plan for him to transition to rehab to assist with his mobility and that yes it will be uncomfortable at first but necessary if he wishes to improve at all. Also stressed importance of nutrition as his lunch appears untouched. I pointed out his dessert in which he grabbed the piece of cake with his hand and proceeded to eat it in 2 rather large bites (very impulsive). He was not interested in any other food on his tray at this time. Please call for any further palliative follow up. Would recommend to request outpatient palliative follow up at SNF on d/c summary.   15 min  Vinie Sill, NP Palliative Medicine Team Pager # [phone] (M-F 8a-5p) Team Phone # [phone] (Nights/Weekends)

## 2017-10-19 NOTE — Progress Notes (Signed)
Andrew NOTE    Andrew [name]  HFW:[id number] DOB: 26-May-1946 DOA: 10/10/2017 PCP: Christain Sacramento, MD    Brief Narrative: Andrew Lines Parrishis a 72 y.o.malewith medical history significant forType 2 DM, HTN, HLD, R eye blindness, tobacco abuse presents [name] the [name] c/o worsening RLE cellulitis. Pt reported he sustained a wound [name] the foot about 3 weeks [name], Andrew [name], Andrew didn't help with the [name]. Pt denies any fever/chills, nausea/vomiting, chest pain, SOB, abdominal pain. Of note, pt has had recurrent cellulitis with very poor wound healing. Last R arm [name], Andrew a skin graft. In the [name], Andrew [name] on the ant lower leg [name] dorsum of foot. Surrounding erythema Andrew. Pt was Andrew [name] be septic and admitted for further management.   Patient admitted with Sepsis due [name] right leg cellulitis complicated by [name]/necrosis. He was started on IV vancomycin and cefepime. He was treated for total of 8 days of IV antibiotics. 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 He has wound Vac in place, need [name] keep for ob=ne week per Dr Sharol Given. Patient was also notice [name] have large amount of purulent drainage from right testicle. [place] was negative for abscess. Discussed case with urology who recommend Bactrim for 5 days.  Patient needs SNF.   Assessment & Plan:   Principal Problem:   Cellulitis Active Problems:   Hyperlipidemia   Tobacco abuse disorder   Essential hypertension   Diabetic autonomic neuropathy associated with type 2 diabetes mellitus (HCC)   Legal blindness   Atherosclerosis of native arteries of extremities with gangrene, right leg (HCC)   Goals of care, counseling/discussion   Palliative care encounter   Malnutrition of moderate degree  Sepsis due [name] right leg cellulitis complicated by [name]/necrosis Elevated  WBC 30s, elevated LA on admission ESR 92,  CRP 13.5, pre-albumin <5 ABI right and left 0.9---0.9 Plastic surgery consulted, rec amputation, not a good candidate for Acell/VAC treatment Dr Sharol Given recommend Amputation.  Treated with IV vancomycin, cefepime for 8 days.  WBC trending down. 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 Has wound vac. Follow ortho recommendations. Needs wound vac for 1 week.  Change antibiotics [name] Bactrim, [name] cover for scrotal [name] also.   Scrotal [name] ?  Per nurse documentation patient had drainage from scrotum. Continue with IV antibiotics. [place] no abscess.  Discussed case with urology, recommend Bactrim for  for 5 days.   Anemia; Iron deficiency anemia  Anemia panel. Iron deficiency.  Hb stable at 8---9 Started ferrous sulfate.   DM type 2;  Continue with lantus. 20 units.  SSI and  meals coverage.   Transaminases; improved. Ammonia not significant elevated 36.  HLD; on Zocor.   HTN; Continue with Norvasc, metoprolol.   Tobacco abuse Advised [name] quit Nicotine patch ordered  Hyponatremia; IV fluids.   Constipation; on miralax and senokot. Refuse suppository. Had BM.  Oral candidiasis. Started  nystatin. Fuconazole.   DVT prophylaxis: SCD Code Status: DNR Family Communication: care discussed with patient  Disposition Plan: Needs SNF. Awaiting OT. Will need Palliative care [name] follow on patient.    Consultants:   Dr Sharol Given.    Procedures:   ABI   Antimicrobials:  Vancomycin, cefepime, flagyl 2-25  Subjective: Still thinking about SNF. Complaining of pain sx site. Had BM. denies scrotal pain    Objective: Vitals:   10/18/17  1720 10/18/17 2153 10/19/17 0554 10/19/17 0900  BP: (!) 147/62 (!) 143/55 140/65 (!) 148/64  Pulse: 82 78 73 79  Resp: 16 16 17 16   Temp: 98.5 F (36.9 C) 98 F (36.7 C) 98.6 F (37 C) 98.4 F (36.9 C)  TempSrc: Oral Oral Oral Oral  SpO2: 95% 96% 95% 96%  Weight:  77.6 kg (171 lb 1.4 oz)    Height:          Intake/Output Summary (Last 24 hours) at 10/19/2017 1349 Last data filed at 10/19/2017 0900 Gross per 24 hour  Intake 740 ml  Output 950 ml  Net -210 ml   Filed Weights   10/15/17 2037 10/17/17 2219 10/18/17 2153  Weight: 77.5 kg (170 lb 13.7 oz) 77.6 kg (171 lb 1.2 oz) 77.6 kg (171 lb 1.4 oz)    Examination:  General exam: NAD, Chronic ill appearing.  Respiratory system: CTA Cardiovascular system: S 1, S 2 RRR Gastrointestinal system: BS present, soft, nt Central nervous system: Alert.  Extremities: S/P AKA, wound vac in place.  GU; scrotum, no significant redness, mild localized skin thickening redness,     Data Reviewed: I have personally reviewed following labs and imaging studies  CBC: Recent Labs  Lab 10/13/17 0512 10/14/17 0422 10/15/17 0437 10/16/17 0632 10/16/17 1601 10/17/17 0600 10/18/17 0511 10/19/17 0711  WBC 26.9* 24.8* 20.9* 22.2* 19.6* 16.0* 14.0* 12.2*  NEUTROABS 22.8* 20.4* 16.3* 18.2*  --  12.3*  --   --   HGB 10.0* 9.9* 9.3* 9.5* 8.2* 8.2* 8.5* 9.0*  HCT 30.1* 30.0* 28.9* 29.0* 25.6* 25.6* 26.5* 27.7*  MCV 84.8 83.6 83.8 84.1 84.2 85.6 84.7 84.5  PLT 737* 752* 764* 717* 700* 732* 779* 801*   Basic Metabolic Panel: Recent Labs  Lab 10/14/17 0422 10/15/17 0437 10/15/17 1647 10/17/17 0600 10/19/17 0711  NA 133* 133* 130* 133* 134*  K 3.7 3.8 4.2 4.0 4.5  CL 99* 99* 97* 99* 99*  CO2 25 26 25 26 27   GLUCOSE 231* 158* 339* 134* 172*  BUN 9 9 13 8 9   CREATININE 0.54* 0.55* 0.61 0.57* 0.44*  CALCIUM 8.5* 8.5* 8.2* 8.2* 8.3*   GFR: Estimated Creatinine Clearance: 78 mL/min (A) (by C-G formula based on SCr of 0.44 mg/dL (L)). Liver Function Tests: Recent Labs  Lab 10/14/17 0422  AST 26  ALT 16*  ALKPHOS 142*  BILITOT 0.3  PROT 5.3*  ALBUMIN 1.9*   No results for input(s): LIPASE, AMYLASE in the last 168 hours. Recent Labs  Lab 10/14/17 1415  AMMONIA 36*   Coagulation Profile: No results for input(s): INR, PROTIME in the last  168 hours. Cardiac Enzymes: No results for input(s): CKTOTAL, CKMB, CKMBINDEX, TROPONINI in the last 168 hours. BNP (last 3 results) No results for input(s): PROBNP in the last 8760 hours. HbA1C: No results for input(s): HGBA1C in the last 72 hours. CBG: Recent Labs  Lab 10/18/17 1154 10/18/17 1632 10/18/17 2153 10/19/17 0736 10/19/17 1149  GLUCAP 228* 232* 94 167* 234*   Lipid Profile: No results for input(s): CHOL, HDL, LDLCALC, TRIG, CHOLHDL, LDLDIRECT in the last 72 hours. Thyroid Function Tests: No results for input(s): TSH, T4TOTAL, FREET4, T3FREE, THYROIDAB in the last 72 hours. Anemia Panel: Recent Labs    10/18/17 0511  VITAMINB12 463  FOLATE 18.9  FERRITIN 312  TIBC 118*  IRON 16*  RETICCTPCT 3.5*   Sepsis Labs: No results for input(s): PROCALCITON, LATICACIDVEN in the last 168 hours.  Recent Results (from the  past 240 hour(s))  Culture, blood (routine x 2)     Status: None   Collection Time: 10/10/17  4:20 PM  Result Value Ref Range Status   Specimen Description BLOOD RIGHT HAND  Final   Special Requests   Final    BOTTLES DRAWN AEROBIC AND ANAEROBIC Blood Culture adequate volume   Culture   Final    NO GROWTH 5 DAYS Performed at [facility], [address], [place], [place] [address]    Report Status 10/15/2017 FINAL  Final  Culture, blood (routine x 2)     Status: None   Collection Time: 10/10/17  4:35 PM  Result Value Ref Range Status   Specimen Description BLOOD RIGHT ANTECUBITAL  Final   Special Requests   Final    BOTTLES DRAWN AEROBIC AND ANAEROBIC Blood Culture adequate volume   Culture   Final    NO GROWTH 5 DAYS Performed at [facility], [place] [address], [place], [place] [address]    Report Status 10/15/2017 FINAL  Final  MRSA PCR Screening     Status: Abnormal   Collection Time: 10/10/17  8:32 PM  Result Value Ref Range Status   MRSA by PCR POSITIVE (A) NEGATIVE Final    Comment:        The GeneXpert MRSA Assay  (FDA approved for NASAL specimens only), is one component of a comprehensive MRSA colonization surveillance program. It is not intended [name] diagnose MRSA [name] nor [name] guide or monitor treatment for MRSA infections. RESULT CALLED [name], READ BACK BY AND VERIFIED WITH: MITZ,A RN 10/10/17 AT [facility]          Radiology Studies: No results found.      Scheduled Meds: . amLODipine  5 mg Oral Daily  . aspirin EC  325 mg Oral Daily  . atorvastatin  20 mg Oral q1800  . bisacodyl  10 mg Rectal Once  . docusate sodium  100 mg Oral BID  . feeding supplement (ENSURE ENLIVE)  237 mL Oral TID BM  . ferrous sulfate  325 mg Oral BID WC  . fluconazole  100 mg Oral Daily  . gabapentin  100 mg Oral TID  . insulin aspart  0-5 Units Subcutaneous QHS  . insulin aspart  0-9 Units Subcutaneous TID WC  . insulin aspart  3 Units Subcutaneous TID WC  . insulin glargine  20 Units Subcutaneous Daily  . metoprolol succinate  50 mg Oral Daily  . multivitamin with minerals  1 tablet Oral Daily  . nicotine  21 mg Transdermal QHS  . nystatin  5 mL Oral QID  . polyethylene glycol  17 g Oral Daily  . senna-docusate  1 tablet Oral BID  . sodium chloride flush  3 mL Intravenous Q12H  . sulfamethoxazole-trimethoprim  1 tablet Oral Q12H  . thiamine  100 mg Oral Daily   Continuous Infusions: . sodium chloride Stopped (10/17/17 2107)  . sodium chloride 10 mL/hr at 10/17/17 2110  . methocarbamol (ROBAXIN)  IV       LOS: 9 days    Time spent: 35 minutes.,     Elmarie Shiley, MD [facility] Pager [phone]  If 7PM-7AM, please contact night-coverage [email or web address] Password [email or web address] 10/19/2017, 1:49 PM

## 2017-10-19 NOTE — Clinical Social Work Note (Signed)
CSW attempted to find placement for patient today, based on daughter's selections. [facility] did not have any beds and [facility] Larene Beach has concerns about the statements in PT/OT notes regarding patient's refusal to participate with therapies. Larene Beach advised CSW that we will have to talk with her nursing director regarding patient and she will let me know tomorrow morning.   CSW primarily communicated with daughter via text and she responded a couple of times. Made several calls to her mobile phone (patient works) and her voice mail is full. CSW contacted patient's friend Linward Natal (6:41 pm), who has been friends with the family for 40 plus years. She texted daughter and asked her to call CSW. Ms. Wynonia Lawman reported that she looks in on patient 2/3 times a week, usually on M-W-F and checks his sugars and administers his insulin, if he allows and takes him to his doctor's appointments. Ms. Wynonia Lawman also informed CSW of the 2 facilities that the daughter chose, reporting again that she knows the family very well, and also that the son works part-time. CSW will continue attempts to reach daughter by calling and text on 3/6. Daughter contacted and updated.  Alise Calais Givens, MSW, LCSW Licensed Clinical Social Worker [place] [phone]

## 2017-10-19 NOTE — NC FL2 (Signed)
[place] LEVEL OF CARE SCREENING TOOL     IDENTIFICATION  Patient Name: Andrew [name] Birthdate: Jun 23, 1946 Sex: male Admission Date (Current Location): 10/10/2017  [facility] and [place] Number:  Herbalist and Address:  [facility], [place] [address] [place], [place] [address]      Provider Number: [id number]  Attending Physician Name and Address:  Elmarie Shiley, MD  Relative Name and Phone Number:  Mariea Clonts    Current Level of Care: Hospital Recommended Level of Care: [place] Prior Approval Number:    Date Approved/Denied:   PASRR Number: [id number](Eff. 09/13/16)  Discharge Plan: SNF    Current Diagnoses: Patient Active Problem List   Diagnosis Date Noted  . Malnutrition of moderate degree 10/13/2017  . Goals of care, counseling/discussion   . Palliative care encounter   . Atherosclerosis of native arteries of extremities with gangrene, right leg ([place])   . Cellulitis 10/10/2017  . Arm wound, right, initial encounter 11/19/2016  . Soft tissue infection   . Gram-negative infection   . Protein-calorie malnutrition, severe 09/02/2016  . Elevated troponin 08/31/2016  . Pressure injury of skin 08/31/2016  . Sepsis ([place]) 08/30/2016  . Chest pain 08/30/2016  . AKI (acute kidney injury) ([place]) 08/30/2016  . Chronic diastolic CHF (congestive heart failure) ([place]) 08/30/2016  . Septic olecranon bursitis of right elbow   . Diabetic autonomic neuropathy associated with type 2 diabetes mellitus ([place]) 03/11/2016  . Legal blindness 01/16/2016  . Essential hypertension   . Hypokalemia 04/12/2015  . 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 Hypomagnesemia   . Bacteremia due to methicillin resistant Staphylococcus aureus 04/10/2015  . Atherosclerotic peripheral vascular disease ([place]) 04/10/2015  . Acute bronchitis 04/09/2015  . Hypertension 04/09/2015  . Hyperlipidemia  04/09/2015  . Alcohol abuse 04/09/2015  . Tobacco abuse disorder 04/09/2015  . Acute encephalopathy     Orientation RESPIRATION BLADDER Height & Weight     Self, Time, Situation, Place  Normal Incontinent, External catheter(Catheter placed 3/2) Weight: 171 lb 1.4 oz (77.6 kg) Height:  5\' 7"  (170.2 cm)  BEHAVIORAL SYMPTOMS/MOOD NEUROLOGICAL BOWEL NUTRITION STATUS      Continent Diet(Regular)  AMBULATORY STATUS COMMUNICATION OF NEEDS Skin   Total Care(Patient was unable to ambulate with PT during evaluation) Verbally Other (Comment)(MASD to groin & scrotum treated with barrier cream; Abrasion to right/left hand; Incision right thigh with dressing and wound vac; Wounds to right/left elbow; Right lower leg wound)                       Personal Care Assistance Level of Assistance  Bathing, Feeding, Dressing Bathing Assistance: Limited assistance Feeding assistance: Independent Dressing Assistance: Limited assistance     Functional Limitations Info  Sight, Hearing, Speech Sight Info: Impaired(Blind right eye) Hearing Info: Adequate Speech Info: Adequate    SPECIAL CARE FACTORS FREQUENCY  PT (By licensed PT), OT (By licensed OT)     PT Frequency: Evaluated 3/3 and a minimum of 3 times a week recommended during acute inpatient stay              Contractures Contractures Info: Present    Additional Factors Info  Code Status, Allergies, Isolation Precautions Code Status Info: DNR Allergies Info: Penicillins     Isolation Precautions Info: MRSA     Current Medications (10/19/2017):  This is the current hospital active medication list Current Facility-Administered Medications  Medication  Dose Route Frequency Provider Last Rate Last Dose  . 0.9 %  sodium chloride infusion   Intravenous Continuous Regalado, Belkys A, MD   Stopped at 10/17/17 2107  . 0.9 %  sodium chloride infusion   Intravenous Continuous Newt Minion, MD 10 mL/hr at 10/17/17 2110    . acetaminophen  (TYLENOL) tablet 325-650 mg  325-650 mg Oral Q6H PRN Newt Minion, MD      . amLODipine (NORVASC) tablet 5 mg  5 mg Oral Daily Alma Friendly, MD   5 mg at 10/18/17 0849  . aspirin EC tablet 325 mg  325 mg Oral Daily Alma Friendly, MD   325 mg at 10/18/17 0848  . atorvastatin (LIPITOR) tablet 20 mg  20 mg Oral q1800 Regalado, Belkys A, MD   20 mg at 10/18/17 1651  . bisacodyl (DULCOLAX) suppository 10 mg  10 mg Rectal Daily PRN Newt Minion, MD      . bisacodyl (DULCOLAX) suppository 10 mg  10 mg Rectal Once Regalado, Belkys A, MD      . docusate sodium (COLACE) capsule 100 mg  100 mg Oral BID Newt Minion, MD   100 mg at 10/18/17 2205  . feeding supplement (ENSURE ENLIVE) (ENSURE ENLIVE) liquid 237 mL  237 mL Oral TID BM Regalado, Belkys A, MD   237 mL at 10/18/17 2206  . ferrous sulfate tablet 325 mg  325 mg Oral BID WC Regalado, Belkys A, MD   325 mg at 10/19/17 0830  . fluconazole (DIFLUCAN) IVPB 100 mg  100 mg Intravenous Q24H Regalado, Belkys A, MD   Stopped at 10/18/17 1430  . gabapentin (NEURONTIN) capsule 100 mg  100 mg Oral TID Alma Friendly, MD   100 mg at 10/18/17 2203  . HYDROcodone-acetaminophen (NORCO) 7.5-325 MG per tablet 1-2 tablet  1-2 tablet Oral Q4H PRN Newt Minion, MD   2 tablet at 10/18/17 2202  . HYDROcodone-acetaminophen (NORCO/VICODIN) 5-325 MG per tablet 1-2 tablet  1-2 tablet Oral Q4H PRN Newt Minion, MD   2 tablet at 10/17/17 1725  . insulin aspart (novoLOG) injection 0-5 Units  0-5 Units Subcutaneous QHS Alma Friendly, MD   2 Units at 10/15/17 2159  . insulin aspart (novoLOG) injection 0-9 Units  0-9 Units Subcutaneous TID WC Alma Friendly, MD   2 Units at 10/19/17 [phone]  . insulin aspart (novoLOG) injection 3 Units  3 Units Subcutaneous TID WC Regalado, Belkys A, MD   3 Units at 10/19/17 0834  . insulin glargine (LANTUS) injection 20 Units  20 Units Subcutaneous Daily Alma Friendly, MD   20 Units at 10/18/17 [phone]  .  magnesium citrate solution 1 Bottle  1 Bottle Oral Once PRN Newt Minion, MD      . methocarbamol (ROBAXIN) tablet 500 mg  500 mg Oral Q6H PRN Newt Minion, MD       Or  . methocarbamol (ROBAXIN) 500 mg in dextrose 5 % 50 mL IVPB  500 mg Intravenous Q6H PRN Newt Minion, MD      . metoCLOPramide (REGLAN) tablet 5-10 mg  5-10 mg Oral Q8H PRN Newt Minion, MD       Or  . metoCLOPramide (REGLAN) injection 5-10 mg  5-10 mg Intravenous Q8H PRN Newt Minion, MD      . metoprolol succinate (TOPROL-XL) 24 hr tablet 50 mg  50 mg Oral Daily Alma Friendly, MD   50 mg at  10/18/17 0850  . morphine 2 MG/ML injection 0.5-1 mg  0.5-1 mg Intravenous Q2H PRN Newt Minion, MD   2 mg at 10/19/17 0221  . multivitamin with minerals tablet 1 tablet  1 tablet Oral Daily Alma Friendly, MD   1 tablet at 10/18/17 0848  . nicotine (NICODERM CQ - dosed in mg/24 hours) patch 21 mg  21 mg Transdermal QHS Alma Friendly, MD   21 mg at 10/15/17 2157  . nystatin (MYCOSTATIN) 100000 UNIT/ML suspension 500,000 Units  5 mL Oral QID Regalado, Belkys A, MD   500,000 Units at 10/18/17 2203  . ondansetron (ZOFRAN) tablet 4 mg  4 mg Oral Q6H PRN Newt Minion, MD       Or  . ondansetron [facility]) injection 4 mg  4 mg Intravenous Q6H PRN Newt Minion, MD      . polyethylene glycol (MIRALAX / GLYCOLAX) packet 17 g  17 g Oral Daily Regalado, Belkys A, MD   17 g at 10/18/17 0851  . polyethylene glycol (MIRALAX / GLYCOLAX) packet 17 g  17 g Oral Daily PRN Newt Minion, MD      . senna-docusate (Senokot-S) tablet 1 tablet  1 tablet Oral BID Regalado, Belkys A, MD   1 tablet at 10/18/17 2202  . sodium chloride flush (NS) 0.9 % injection 3 mL  3 mL Intravenous Q12H Alma Friendly, MD   3 mL at 10/18/17 0851  . sulfamethoxazole-trimethoprim (BACTRIM DS,SEPTRA DS) 800-160 MG per tablet 1 tablet  1 tablet Oral Q12H Regalado, Belkys A, MD   1 tablet at 10/18/17 2205  . thiamine (VITAMIN B-1) tablet 100 mg   100 mg Oral Daily Alma Friendly, MD   100 mg at 10/18/17 1014  . traZODone (DESYREL) tablet 25 mg  25 mg Oral QHS PRN Alma Friendly, MD   25 mg at 10/18/17 2203     Discharge Medications: Please see discharge summary for a list of discharge medications.  Relevant Imaging Results:  Relevant Lab Results:   Additional Information ss#[id number]; Right above the knee amputation on 10/16/17.  Right thumb and partial middle finger amputation.   Sable Feil, LCSW

## 2017-10-19 NOTE — Progress Notes (Signed)
Patient ID: Andrew [name], male   DOB: Mar 06, 1946, 72 y.o.   MRN: [id number] Postoperative day 3 above-the-knee amputation.  Minimal drainage in the wound VAC canister.  Patient is resting comfortably.  Anticipate discharge to skilled nursing.

## 2017-10-20 DIAGNOSIS — E440 Moderate protein-calorie malnutrition: Secondary | ICD-10-CM

## 2017-10-20 DIAGNOSIS — L089 Local infection of the skin and subcutaneous tissue, unspecified: Secondary | ICD-10-CM

## 2017-10-20 DIAGNOSIS — T148XXA Other injury of unspecified body region, initial encounter: Secondary | ICD-10-CM

## 2017-10-20 LAB — GLUCOSE, CAPILLARY
GLUCOSE-CAPILLARY: 207 mg/dL — AB (ref 65–99)
Glucose-Capillary: 219 mg/dL — ABNORMAL HIGH (ref 65–99)
Glucose-Capillary: 171 mg/dL — ABNORMAL HIGH (ref 65–99)
Glucose-Capillary: 176 mg/dL — ABNORMAL HIGH (ref 65–99)

## 2017-10-20 MED ORDER — INSULIN ASPART 100 UNIT/ML ~~LOC~~ SOLN
5.0000 [IU] | Freq: Three times a day (TID) | SUBCUTANEOUS | Status: DC
  Administered 2017-10-20: 5 [IU] via SUBCUTANEOUS

## 2017-10-20 NOTE — Progress Notes (Signed)
PROGRESS NOTE  Andrew [name] MBW:[id number] DOB: [birth year] DOA: 10/10/2017 PCP: Andrew [place], MD  HPI/Recap of past 24 hours: Andrew [name] a 72 y.o.malewith medical history significant forType 2 DM, HTN, HLD, R eye blindness, tobacco abuse presents to the ED c/o worsening RLE cellulitis. Patient admitted with Sepsis due to right leg cellulitis complicated by gangrene/eschar/necrosis. Pt was treated with IV vancomycin and cefepime for a total of 8 days. Pt subsequently underwent AKA with wound vac placement by Dr Andrew [name] on 3-02. Patient was also noted to have large amount of purulent drainage from right testicle. [place] was negative for abscess. Discussed case with urology who recommend Bactrim for 5 days. Awaiting SNF placement.  Today, denies any new complaints, denies chest pain, SOB, abdominal pain, fever/chills.  Assessment/Plan: Principal Problem:   Cellulitis Active Problems:   Hyperlipidemia   Tobacco abuse disorder   Essential hypertension   Diabetic autonomic neuropathy associated with type 2 diabetes mellitus (HCC)   Legal blindness   Atherosclerosis of native arteries of extremities with gangrene, right leg (HCC)   Goals of care, counseling/discussion   Palliative care encounter   Malnutrition of moderate degree   S/P AKA (above knee amputation), right (HCC)  Sepsis due to right leg cellulitis complicated by gangrene/eschar/necrosis  S/P AKA with wound vac placement on 10/16/17 Improving, afebrile, downtrending leukocytosis Elevated WBC 30s, elevated LA on admission ABI right and left 0.9---0.9 Treated with IV vancomycin, cefepime for 8 days.  Ortho on board: Needs wound vac for 1 week  ?Scrotal infection Per nurse documentation patient had drainage from scrotum. US scrotum, no abscess Discussed case with urology, recommend Bactrim for 5 days  Normocytic anemia Mixed: Iron deficiency anemia + ACD Hb stable at 8---9 Started PO ferrous sulfate  DM type  2 Continue with lantus 20 units.  SSI and TID meals coverage  HLD Continue Zocor.   HTN Continue with Norvasc, metoprolol   Tobacco abuse Advised to quit Nicotine patch ordered  Hyponatremia IV fluids.   Constipation On miralax and senokot. Refuse suppository  Oral candidiasis Started  nystatin. Fuconazole.      Code Status: DNR  Family Communication: None at bedside  Disposition Plan: SNF placement   Consultants:  Ortopedics  Procedures:  AKA  Antimicrobials:  Vancomycin  Cefepime  Flagyl  Bactrim  DVT prophylaxis:  SCDs   Objective: Vitals:   10/19/17 0900 10/19/17 1810 10/19/17 2210 10/20/17 0900  BP: (!) 148/64 (!) 154/62 (!) 137/91 (!) 144/90  Pulse: 79 80 87 88  Resp: 16 16 16 18   Temp: 98.4 F (36.9 C) 98 F (36.7 C) 98.1 F (36.7 C) 98 F (36.7 C)  TempSrc: Oral Oral Oral Oral  SpO2: 96% 98% 95% 99%  Weight:      Height:        Intake/Output Summary (Last 24 hours) at 10/20/2017 1453 Last data filed at 10/20/2017 0177 Gross per 24 hour  Intake 845 ml  Output 1000 ml  Net -155 ml   Filed Weights   10/15/17 2037 10/17/17 2219 10/18/17 2153  Weight: 77.5 kg (170 lb 13.7 oz) 77.6 kg (171 lb 1.2 oz) 77.6 kg (171 lb 1.4 oz)    Exam:   General:  NAD,   Cardiovascular: S1, S2 present  Respiratory: CTA   Abdomen: Soft, NT, ND, BS+   Musculoskeletal: R AKA with wound vac in place, LLE no pedal edema  Skin: Normal  Psychiatry: Normal mood   Data Reviewed: CBC: Recent Labs  Lab 10/14/17 0422 10/15/17 0437 10/16/17 6387 10/16/17 1601 10/17/17 0600 10/18/17 0511 10/19/17 0711  WBC 24.8* 20.9* 22.2* 19.6* 16.0* 14.0* 12.2*  NEUTROABS 20.4* 16.3* 18.2*  --  12.3*  --   --   HGB 9.9* 9.3* 9.5* 8.2* 8.2* 8.5* 9.0*  HCT 30.0* 28.9* 29.0* 25.6* 25.6* 26.5* 27.7*  MCV 83.6 83.8 84.1 84.2 85.6 84.7 84.5  PLT 752* 764* 717* 700* 732* 779* 564*   Basic Metabolic Panel: Recent Labs  Lab 10/14/17 0422  10/15/17 0437 10/15/17 1647 10/17/17 0600 10/19/17 0711  NA 133* 133* 130* 133* 134*  K 3.7 3.8 4.2 4.0 4.5  CL 99* 99* 97* 99* 99*  CO2 25 26 25 26 27   GLUCOSE 231* 158* 339* 134* 172*  BUN 9 9 13 8 9   CREATININE 0.54* 0.55* 0.61 0.57* 0.44*  CALCIUM 8.5* 8.5* 8.2* 8.2* 8.3*   GFR: Estimated Creatinine Clearance: 78 mL/min (A) (by C-G formula based on SCr of 0.44 mg/dL (L)). Liver Function Tests: Recent Labs  Lab 10/14/17 0422  AST 26  ALT 16*  ALKPHOS 142*  BILITOT 0.3  PROT 5.3*  ALBUMIN 1.9*   No results for input(s): LIPASE, AMYLASE in the last 168 hours. Recent Labs  Lab 10/14/17 1415  AMMONIA 36*   Coagulation Profile: No results for input(s): INR, PROTIME in the last 168 hours. Cardiac Enzymes: No results for input(s): CKTOTAL, CKMB, CKMBINDEX, TROPONINI in the last 168 hours. BNP (last 3 results) No results for input(s): PROBNP in the last 8760 hours. HbA1C: No results for input(s): HGBA1C in the last 72 hours. CBG: Recent Labs  Lab 10/19/17 1149 10/19/17 1700 10/19/17 2210 10/20/17 0732 10/20/17 1119  GLUCAP 234* 372* 345* 219* 207*   Lipid Profile: No results for input(s): CHOL, HDL, LDLCALC, TRIG, CHOLHDL, LDLDIRECT in the last 72 hours. Thyroid Function Tests: No results for input(s): TSH, T4TOTAL, FREET4, T3FREE, THYROIDAB in the last 72 hours. Anemia Panel: Recent Labs    10/18/17 0511  VITAMINB12 463  FOLATE 18.9  FERRITIN 312  TIBC 118*  IRON 16*  RETICCTPCT 3.5*   Urine analysis:    Component Value Date/Time   COLORURINE YELLOW 08/30/2016 1926   APPEARANCEUR HAZY (A) 08/30/2016 1926   LABSPEC 1.020 08/30/2016 1926   PHURINE 5.0 08/30/2016 1926   GLUCOSEU >=500 (A) 08/30/2016 1926   HGBUR MODERATE (A) 08/30/2016 1926   BILIRUBINUR NEGATIVE 08/30/2016 1926   KETONESUR 20 (A) 08/30/2016 1926   PROTEINUR 100 (A) 08/30/2016 1926   UROBILINOGEN 0.2 04/18/2015 0210   NITRITE NEGATIVE 08/30/2016 1926   LEUKOCYTESUR NEGATIVE  08/30/2016 1926   Sepsis Labs: @LABRCNTIP (procalcitonin:4,lacticidven:4)  ) Recent Results (from the past 240 hour(s))  Culture, blood (routine x 2)     Status: None   Collection Time: 10/10/17  4:20 PM  Result Value Ref Range Status   Specimen Description BLOOD RIGHT HAND  Final   Special Requests   Final    BOTTLES DRAWN AEROBIC AND ANAEROBIC Blood Culture adequate volume   Culture   Final    NO GROWTH 5 DAYS Performed at [facility], [place] [address], [place], [place] [address]    Report Status 10/15/2017 FINAL  Final  Culture, blood (routine x 2)     Status: None   Collection Time: 10/10/17  4:35 PM  Result Value Ref Range Status   Specimen Description BLOOD RIGHT ANTECUBITAL  Final   Special Requests   Final    BOTTLES DRAWN AEROBIC AND ANAEROBIC  Blood Culture adequate volume   Culture   Final    NO GROWTH 5 DAYS Performed at [facility], [place] [address] [place], [place] [address]    Report Status 10/15/2017 FINAL  Final  MRSA PCR Screening     Status: Abnormal   Collection Time: 10/10/17  8:32 PM  Result Value Ref Range Status   MRSA by PCR POSITIVE (A) NEGATIVE Final    Comment:        The GeneXpert MRSA Assay (FDA approved for NASAL specimens only), is one component of a comprehensive MRSA colonization surveillance program. It is not intended to diagnose MRSA infection nor to guide or monitor treatment for MRSA infections. RESULT CALLED TO, READ BACK BY AND VERIFIED WITH: MITZ,A RN 10/10/17 AT 2227 SKEEN,P       Studies: No results found.  Scheduled Meds: . amLODipine  5 mg Oral Daily  . aspirin EC  325 mg Oral Daily  . atorvastatin  20 mg Oral q1800  . bisacodyl  10 mg Rectal Once  . docusate sodium  100 mg Oral BID  . feeding supplement (ENSURE ENLIVE)  237 mL Oral TID BM  . ferrous sulfate  325 mg Oral BID WC  . fluconazole  100 mg Oral Daily  . gabapentin  100 mg Oral TID  . insulin aspart  0-5 Units Subcutaneous QHS  . insulin  aspart  0-9 Units Subcutaneous TID WC  . insulin aspart  3 Units Subcutaneous TID WC  . insulin glargine  20 Units Subcutaneous Daily  . metoprolol succinate  50 mg Oral Daily  . multivitamin with minerals  1 tablet Oral Daily  . nicotine  21 mg Transdermal QHS  . nystatin  5 mL Oral QID  . polyethylene glycol  17 g Oral Daily  . senna-docusate  1 tablet Oral BID  . sodium chloride flush  3 mL Intravenous Q12H  . sulfamethoxazole-trimethoprim  1 tablet Oral Q12H  . thiamine  100 mg Oral Daily    Continuous Infusions: . sodium chloride Stopped (10/17/17 2107)  . sodium chloride 10 mL/hr at 10/17/17 2110  . methocarbamol (ROBAXIN)  IV       LOS: 10 days     Alma Friendly, MD [facility]  If 7PM-7AM, please contact night-coverage [email or web address] Password [facility] 10/20/2017, 2:53 PM

## 2017-10-20 NOTE — Progress Notes (Signed)
Inpatient Diabetes Program Recommendations  AACE/ADA: New Consensus Statement on Inpatient Glycemic Control (2015)  Target Ranges:  Prepandial:   less than 140 mg/dL      Peak postprandial:   less than 180 mg/dL (1-2 hours)      Critically ill patients:  140 - 180 mg/dL   Results for AMAAR, OSHITA (MRN [id number]) as of 10/20/2017 10:39  Ref. Range 10/19/2017 07:36 10/19/2017 11:49 10/19/2017 17:00 10/19/2017 22:10 10/20/2017 07:32  Glucose-Capillary Latest Ref Range: 65 - 99 mg/dL 167 (H) 234 (H) 372 (H) 345 (H) 219 (H)   Review of Glycemic Control  Diabetes history:Type 2 DM Outpatient Diabetes medications:Toujeo 60 units daily, Januvia 100 mg daily, Metformin 1000 mg bid Current orders for Inpatient glycemic control: Lantus 20 units daily, Novolog sensitive Correction 0-9 units tid with meals and Novolog HS scale 0-5 units, Novolog 3 Units TID meal coverage  Inpatient Diabetes Program Recommendations:    Glucose increases with meals. Consider increasing Novolog meal coverage to 5 units tid if patient consumes at least 50% of meals.  Thanks,  Tama Headings RN, MSN, BC-ADM, [facility] Inpatient Diabetes Coordinator Team Pager [phone] (8a-5p)

## 2017-10-20 NOTE — Clinical Social Work Note (Addendum)
CSW received text from daughter this morning requesting [facility], Contact made with Eugenia Mcalpine, Director of Clinical Transitions with [facility] ([place]) informing her of daughter's choice. CSW faxed clinicals to [employer] for insurance authorization. Patient is medically stable for discharge.  Admissions director has received authorization for patient, effective today for 3 days. CSW will continue to follow and once patient ready for discharge, insurance company will be contacted to adjust authorization dates.   Irbin Fines Givens, MSW, LCSW Licensed Clinical Social Worker [place] [phone]

## 2017-10-20 NOTE — Clinical Social Work Note (Deleted)
Daughter sent text to CSW this morning and is agreeable to [facility] (now [facility] contacted and they can take patient. Insurance authorization initiated and Cathie Beams has received authorization for 3 days, effective today. CSW will continue to follow and once patient ready for discharge, insurance company will be contacted to adjust authorization dates.  Fay Bagg Givens, MSW, LCSW Licensed Clinical Social Worker [place] [phone]

## 2017-10-20 NOTE — Progress Notes (Signed)
Per Dr. Sharol Given, patient will not need wound Vac at discharge, only the black sock to cover wound.

## 2017-10-21 ENCOUNTER — Telehealth (INDEPENDENT_AMBULATORY_CARE_PROVIDER_SITE_OTHER): Payer: Self-pay

## 2017-10-21 LAB — CBC WITH DIFFERENTIAL/PLATELET
BASOS ABS: 0.1 10*3/uL (ref 0.0–0.1)
Basophils Relative: 1 %
EOS ABS: 0.1 10*3/uL (ref 0.0–0.7)
EOS PCT: 1 %
HCT: 26.7 % — ABNORMAL LOW (ref 39.0–52.0)
Hemoglobin: 8.7 g/dL — ABNORMAL LOW (ref 13.0–17.0)
LYMPHS ABS: 2.6 10*3/uL (ref 0.7–4.0)
Lymphocytes Relative: 20 %
MCH: 28 pg (ref 26.0–34.0)
MCHC: 32.6 g/dL (ref 30.0–36.0)
MCV: 85.9 fL (ref 78.0–100.0)
MONO ABS: 0.9 10*3/uL (ref 0.1–1.0)
Monocytes Relative: 7 %
NEUTROS PCT: 71 %
Neutro Abs: 9.3 10*3/uL — ABNORMAL HIGH (ref 1.7–7.7)
Platelets: 762 10*3/uL — ABNORMAL HIGH (ref 150–400)
RBC: 3.11 MIL/uL — AB (ref 4.22–5.81)
RDW: 16.4 % — AB (ref 11.5–15.5)
WBC: 13 10*3/uL — AB (ref 4.0–10.5)

## 2017-10-21 LAB — GLUCOSE, CAPILLARY
GLUCOSE-CAPILLARY: 164 mg/dL — AB (ref 65–99)
GLUCOSE-CAPILLARY: 240 mg/dL — AB (ref 65–99)
Glucose-Capillary: 193 mg/dL — ABNORMAL HIGH (ref 65–99)

## 2017-10-21 MED ORDER — POLYETHYLENE GLYCOL 3350 17 G PO PACK: 17.0000 g | PACK | Freq: Every day | ORAL | 0 refills | Status: AC

## 2017-10-21 MED ORDER — DOCUSATE SODIUM 100 MG PO CAPS: 100.0000 mg | ORAL_CAPSULE | Freq: Two times a day (BID) | ORAL | 0 refills | Status: AC

## 2017-10-21 MED ORDER — NICOTINE 21 MG/24HR TD PT24: 21.0000 mg | MEDICATED_PATCH | Freq: Every day | TRANSDERMAL | 0 refills | Status: DC

## 2017-10-21 MED ORDER — SENNOSIDES-DOCUSATE SODIUM 8.6-50 MG PO TABS: 1.0000 | ORAL_TABLET | Freq: Two times a day (BID) | ORAL | Status: AC

## 2017-10-21 MED ORDER — AMLODIPINE BESYLATE 5 MG PO TABS: 5.0000 mg | ORAL_TABLET | Freq: Every day | ORAL | Status: DC

## 2017-10-21 MED ORDER — HYDROCODONE-ACETAMINOPHEN 5-325 MG PO TABS: 1.0000 | ORAL_TABLET | ORAL | 0 refills | Status: AC | PRN

## 2017-10-21 MED ORDER — SULFAMETHOXAZOLE-TRIMETHOPRIM 800-160 MG PO TABS: 1.0000 | ORAL_TABLET | Freq: Two times a day (BID) | ORAL | Status: DC

## 2017-10-21 MED ORDER — INSULIN GLARGINE 100 UNIT/ML ~~LOC~~ SOLN: 20.0000 [IU] | Freq: Every day | SUBCUTANEOUS | 11 refills | Status: DC

## 2017-10-21 MED ORDER — ATORVASTATIN CALCIUM 10 MG PO TABS: 10.0000 mg | ORAL_TABLET | Freq: Every day | ORAL | Status: AC

## 2017-10-21 MED ORDER — FLUCONAZOLE 100 MG PO TABS: 100.0000 mg | ORAL_TABLET | Freq: Every day | ORAL | 0 refills | Status: AC

## 2017-10-21 MED ORDER — ADULT MULTIVITAMIN W/MINERALS CH: 1.0000 | ORAL_TABLET | Freq: Every day | ORAL | Status: AC

## 2017-10-21 MED ORDER — NYSTATIN 100000 UNIT/ML MT SUSP: 5.0000 mL | Freq: Four times a day (QID) | OROMUCOSAL | 0 refills | Status: DC

## 2017-10-21 MED ORDER — FERROUS SULFATE 325 (65 FE) MG PO TABS: 325.0000 mg | ORAL_TABLET | Freq: Two times a day (BID) | ORAL | 0 refills | Status: DC

## 2017-10-21 NOTE — Clinical Social Work Placement (Signed)
   CLINICAL SOCIAL WORK PLACEMENT  NOTE 10/21/17 - DISCHARGED TO [facility] VIA AMBULANCE.  Date:  10/21/2017  Patient Details  Name: Andrew [name] MRN: [id number] Date of Birth: 02/16/1946  Clinical Social Work is seeking post-discharge placement for this patient at the [place] level of care (*CSW will initial, date and re-position this form in  chart as items are completed):  Yes   Patient/family provided with [facility] Work Department's list of facilities offering this level of care within the geographic area requested by the patient (or if unable, by the patient's family).  Yes   Patient/family informed of their freedom to choose among providers that offer the needed level of care, that participate in Medicare, Medicaid or managed care program needed by the patient, have an available bed and are willing to accept the patient.  Yes   Patient/family informed of [place]'s ownership interest in [facility] and [facility], as well as of the fact that they are under no obligation to receive care at these facilities.  PASRR submitted to EDS on       PASRR number received on       Existing PASRR number confirmed on 10/19/17     FL2 transmitted to all facilities in geographic area requested by pt/family on 10/19/17     FL2 transmitted to all facilities within larger geographic area on       Patient informed that his/her managed care company has contracts with or will negotiate with certain facilities, including the following:        Yes   Patient/family informed of bed offers received.  Patient/family chooses bed at  [facility]     Physician recommends and patient chooses bed at      Patient to be transferred to  [place] on 10/21/17.  Patient to be transferred to facility by Ambulance     Patient family notified on 10/21/17 of transfer.  Name of family member notified:  Mariea Clonts  -daughter     PHYSICIAN       Additional Comment:    _______________________________________________ Sable Feil, LCSW 10/21/2017, 5:54 PM

## 2017-10-21 NOTE — Evaluation (Deleted)
Occupational Therapy Evaluation Patient Details Name: Andrew [name] MRN: [id number] DOB: 1945-10-18 Today's Date: 10/21/2017    History of Present Illness 72 yo male with onset of gangrene R leg was admitted for AK amputation with issues healing wounds in the past, including needing a skin graft for RUE infection.  Poor results with ABT as well. Had sepsis with cellulitis RLE.   Has history of DM, tobacco abuse and PN complicating his issues as well.     Clinical Impression   Pt not progressing towards acute OT goals. Lengthy discussion with pt about pt's goal (no pain and to go home), risks of immobility an improper positioning of R stump (pt leaning onto R side and refused to self-correct). Pt ultimately only doing minimal UB strengthening and some LLE ROM. Pt vocalizing multiple times a distrust of health care professionals and often became agitated with threatening statements/behavior observed at times. Pt presents with both behavioral limitations and impaired cognition limiting progress with OT this session. D/c plan remains appropriate at this time.    Follow Up Recommendations  SNF;Supervision/Assistance - 24 hour    Equipment Recommendations  None recommended by OT;Other (comment)    Recommendations for Other Services       Precautions / Restrictions Precautions Precautions: Fall Precaution Comments: wound vac R stump Restrictions Weight Bearing Restrictions: Yes RLE Weight Bearing: Non weight bearing      Mobility Bed Mobility Overal bed mobility: Needs Assistance             General bed mobility comments: refused to attempt  Transfers                 General transfer comment: refused to attempt    Balance Overall balance assessment: History of Falls                                         ADL either performed or assessed with clinical judgement   ADL Overall ADL's : Needs assistance/impaired                                        General ADL Comments: Lengthy discussion about pt's goals (no pain), pros and cons of therapy. Pt ultimately only doing minimal UB strengthening assessment and some LLE ROM. Discussed that any movement pt can make during the day (shifting position in bed, UB ROM) would be beneficial.      Vision         Perception     Praxis      Pertinent Vitals/Pain Pain Assessment: Faces Faces Pain Scale: Hurts worst Pain Location: operated leg Pain Descriptors / Indicators: Operative site guarding;Tender Pain Intervention(s): Monitored during session;Limited activity within patient's tolerance;Premedicated before session     Hand Dominance     Extremity/Trunk Assessment             Communication     Cognition Arousal/Alertness: Awake/alert Behavior During Therapy: Flat affect;Agitated;Anxious Overall Cognitive Status: No family/caregiver present to determine baseline cognitive functioning                                 General Comments: Despite maximal verbal encouragement, pt refused any attempt at bed mobility. Pt would initiate one bout of therapeutic  exercise of L LE and on second attempt he would scream out and saying it hurt in his residual limb. Pt eventually pulled sheet up around him and closed eyes stating "I'm trying to get some sleep." Pt positioned in bed with extreme pressure on residual limb, therapist attempted to educate on need to offweight and have pt initiate in weightshifting off painful R side. When PT attempted to assist pt stated firmly "Don't touch me".    General Comments  Wound vac in place at  R LE surgical site    Exercises Exercises: Other exercises;General Lower Extremity;General Upper Extremity General Exercises - Upper Extremity Digit Composite Flexion: AROM;Right General Exercises - Lower Extremity Ankle Circles/Pumps: PROM;Left;10 reps;Supine Heel Slides: AAROM;Right;Supine   Shoulder Instructions      Home  Living                                          Prior Functioning/Environment                   OT Problem List:        OT Treatment/Interventions:      OT Goals(Current goals can be found in the care plan section) Acute Rehab OT Goals Patient Stated Goal: no pain OT Goal Formulation: With patient Time For Goal Achievement: 11/02/17 Potential to Achieve Goals: Poor ADL Goals Pt Will Perform Grooming: with min guard assist;sitting Pt Will Perform Upper Body Bathing: sitting;with mod assist Pt Will Perform Lower Body Bathing: with max assist;with mod assist;sitting/lateral leans Pt Will Perform Upper Body Dressing: with mod assist;sitting Pt Will Transfer to Toilet: with max assist;with +2 assist;squat pivot transfer;stand pivot transfer;anterior/posterior transfer Additional ADL Goal #1: Pt will complete bed mobility with mod A to sit EOB for selfcare tasks  OT Frequency: Min 2X/week   Barriers to D/C:            Co-evaluation PT/OT/SLP Co-Evaluation/Treatment: Yes Reason for Co-Treatment: For patient/therapist safety;Necessary to address cognition/behavior during functional activity;To address functional/ADL transfers   OT goals addressed during session: ADL's and self-care      AM-PAC PT "6 Clicks" Daily Activity     Outcome Measure Help from another person eating meals?: A Little Help from another person taking care of personal grooming?: A Little Help from another person toileting, which includes using toliet, bedpan, or urinal?: Total Help from another person bathing (including washing, rinsing, drying)?: Total Help from another person to put on and taking off regular upper body clothing?: A Lot Help from another person to put on and taking off regular lower body clothing?: Total 6 Click Score: 11   End of Session Nurse Communication: Mobility status  Activity Tolerance: Patient limited by pain;Treatment limited secondary to  agitation Patient left: in bed  OT Visit Diagnosis: History of falling (Z91.81);Muscle weakness (generalized) (M62.81);Other abnormalities of gait and mobility (R26.89);Pain Pain - Right/Left: Right Pain - part of body: Leg                Time: 1047-1110 OT Time Calculation (min): 23 min Charges:  OT General Charges $OT Visit: 1 Visit OT Treatments $Self Care/Home Management : 8-22 mins G-Codes:       Hortencia Pilar 10/21/2017, 1:22 PM

## 2017-10-21 NOTE — Progress Notes (Addendum)
Called MD Sharol Given at number listed on Amion. Pt to DC today to [facility]. Left message for need of wound vac DC order and dx orders before DC. Awaiting orders.   22- Paged MD Horris Latino to notify that MD Sharol Given had added orders for Dx change and wound vac DC. Dressing change orders need to be added to DC summary. Also notified MD Horris Latino that pt had 3 beat run of V tach non sustained and pt asymptomatic. Wound vac off per MD Sharol Given order.   Paulla Fore, RN

## 2017-10-21 NOTE — Telephone Encounter (Signed)
Pt is going to d/c from hospital today to [facility] and need orders to d/c wound vac and wound care instructions prior to d/c

## 2017-10-21 NOTE — Discharge Summary (Addendum)
Discharge Summary  Andrew [name] NUU:[id number] DOB: 01/28/1946  PCP: Christain Sacramento, MD  Admit date: 10/10/2017 Discharge date: 10/21/2017  Time spent: > 30 mins  Recommendations for Outpatient Follow-up:  1. PCP 2. Orthopedics  Discharge Diagnoses:  Active Hospital Problems   Diagnosis Date Noted  . Cellulitis 10/10/2017  . S/P AKA (above knee amputation), right ([place])   . Malnutrition of moderate degree 10/13/2017  . Goals of care, counseling/discussion   . Palliative care encounter   . Atherosclerosis of native arteries of extremities with gangrene, right leg ([place])   . Diabetic autonomic neuropathy associated with type 2 diabetes mellitus ([place]) 03/11/2016  . Legal blindness 01/16/2016  . Essential hypertension   . Hyperlipidemia 04/09/2015  . Tobacco abuse disorder 04/09/2015    Resolved Hospital Problems  No resolved problems to display.    Discharge Condition: Stable  Diet recommendation: Heart healthy  Vitals:   10/21/17 0515 10/21/17 1000  BP: 138/60 139/60  Pulse: 73 71  Resp: 16 18  Temp: 98.6 F (37 C) 98 F (36.7 C)  SpO2: 95% 96%    History of present illness:  Andrew Lines Parrishis a 72 y.o.malewith medical history significant forType 2 DM, HTN, HLD, R eye blindness, tobacco abuse presents to the ED c/o worsening RLE cellulitis. Patient admitted withSepsis due to right leg cellulitis complicated by gangrene/eschar/necrosis. Pt was treated with IV vancomycin and cefepime for a total of 8 days. Pt subsequently underwent AKA with wound vac placement by Dr Sharol Given on 3-02. Patient was also noted to have large amount of purulent drainage from right testicle. [place] was negative for abscess. Discussed case with urology who recommend Bactrim for 5 days.   Today, pt denies any new complaints, still c/o pain in RLE (post op pain), denies chest pain, SOB, abdominal pain, fever/chills. Pt stable to be discharged to [facility] Course:  Principal Problem:  Cellulitis Active Problems:   Hyperlipidemia   Tobacco abuse disorder   Essential hypertension   Diabetic autonomic neuropathy associated with type 2 diabetes mellitus ([facility])   Legal blindness   Atherosclerosis of native arteries of extremities with gangrene, right leg ([facility])   Goals of care, counseling/discussion   Palliative care encounter   Malnutrition of moderate degree   S/P AKA (above knee amputation), right ([facility])  Sepsis due to right leg cellulitis complicated by gangrene/eschar/necrosis  S/P AKA with wound vac placement on 10/16/17 Improving, afebrile, downtrending leukocytosis Elevated WBC 30s, elevated LA on admission ABI right and left 0.9---0.9 Treated with IV vancomycin, cefepime for 8 days  Ortho on board: Follow up in 2 weeks, discontinue wound vac.  Wound care: Apply dry dressing with 4X4s and ACE wrap daily. Place stump reducer/black sock over stump always  ?Scrotal infection Resolved Per nurse documentation patient had drainage from scrotum US scrotum, no abscess Discussed case with urology, recommend Bactrim for total 7 days, day 4 today  Normocytic anemia Mixed: Iron deficiency anemia + ACD Hb stable at 8---9 Continue PO ferrous sulfate  DM type 2 Continue with lantus 20 units.  SSI and TID meals coverage  HLD Continue Zocor.   HTN Continue with Norvasc, metoprolol   Tobacco abuse Advised to quit Nicotine patch ordered  Hyponatremia Resolved s/p IVF  Constipation On miralax and senokot  Oral candidiasis Continue nystatin, Fluconazole for 10 days total, stop date 10/28/17     Procedures:  AKA  Consultations:  Orthopedics  Discharge Exam: BP 139/60 (BP Location: Left Arm)   Pulse  71   Temp 98 F (36.7 C) (Oral)   Resp 18   Ht 5\' 7"  (1.702 m)   Wt 73 kg (160 lb 15 oz)   SpO2 96%   BMI 25.21 kg/m   General: AAO, NAD Cardiovascular: S1, S2 present  Respiratory: CTA  Discharge Instructions You were cared for by a  hospitalist during your hospital stay. If you have any questions about your discharge medications or the care you received while you were in the hospital after you are discharged, you can call the unit and asked to speak with the hospitalist on call if the hospitalist that took care of you is not available. Once you are discharged, your primary care physician will handle any further medical issues. Please note that NO REFILLS for any discharge medications will be authorized once you are discharged, as it is imperative that you return to your primary care physician (or establish a relationship with a primary care physician if you do not have one) for your aftercare needs so that they can reassess your need for medications and monitor your lab values.   Allergies as of 10/21/2017      Reactions   Penicillins Rash, Other (See Comments)   Tolerated Zosyn Jan 2018 PATIENT HAS HAD A PCN REACTION WITH IMMEDIATE RASH, FACIAL/TONGUE/THROAT SWELLING, SOB, OR LIGHTHEADEDNESS WITH HYPOTENSION:  #  #  #  YES  #  #  #   Has patient had a PCN reaction causing severe rash involving mucus membranes or skin necrosis:NO Has patient had a PCN reaction that required hospitalization NO Has patient had a PCN reaction occurring within the last 10 years:NO      Medication List    STOP taking these medications   furosemide 20 MG tablet Commonly known as:  LASIX   ibuprofen 200 MG tablet Commonly known as:  ADVIL,MOTRIN   insulin aspart 100 UNIT/ML injection Commonly known as:  NOVOLOG   metFORMIN 500 MG 24 hr tablet Commonly known as:  GLUCOPHAGE-XR   naproxen sodium 220 MG tablet Commonly known as:  ALEVE   oxyCODONE-acetaminophen 5-325 MG tablet Commonly known as:  PERCOCET/ROXICET   simvastatin 40 MG tablet Commonly known as:  ZOCOR     TAKE these medications   amLODipine 5 MG tablet Commonly known as:  NORVASC Take 1 tablet (5 mg total) by mouth daily. What changed:    medication strength  how  much to take   aspirin EC 325 MG tablet Take 325 mg by mouth daily.   atorvastatin 10 MG tablet Commonly known as:  LIPITOR Take 1 tablet (10 mg total) by mouth daily at 6 PM.   docusate sodium 100 MG capsule Commonly known as:  COLACE Take 1 capsule (100 mg total) by mouth 2 (two) times daily.   feeding supplement (ENSURE ENLIVE) Liqd Take 237 mLs by mouth daily. What changed:  when to take this   ferrous sulfate 325 (65 FE) MG tablet Take 1 tablet (325 mg total) by mouth 2 (two) times daily with a meal.   fluconazole 100 MG tablet Commonly known as:  DIFLUCAN Take 1 tablet (100 mg total) by mouth daily for 6 days.   gabapentin 100 MG capsule Commonly known as:  NEURONTIN Take 100 mg by mouth 3 (three) times daily.   HYDROcodone-acetaminophen 5-325 MG tablet Commonly known as:  NORCO/VICODIN Take 1-2 tablets by mouth every 4 (four) hours as needed for moderate pain (pain score 4-6). What changed:    how much  to take  when to take this  reasons to take this  Another medication with the same name was removed. Continue taking this medication, and follow the directions you see here.   insulin glargine 100 UNIT/ML injection Commonly known as:  LANTUS Inject 0.2 mLs (20 Units total) into the skin daily. What changed:    how much to take  when to take this  Another medication with the same name was removed. Continue taking this medication, and follow the directions you see here.   metoprolol succinate 50 MG 24 hr tablet Commonly known as:  TOPROL-XL Take 50 mg by mouth daily. What changed:  Another medication with the same name was removed. Continue taking this medication, and follow the directions you see here.   multivitamin with minerals Tabs tablet Take 1 tablet by mouth daily.   nicotine 21 mg/24hr patch Commonly known as:  NICODERM CQ - dosed in mg/24 hours Place 1 patch (21 mg total) onto the skin at bedtime.   nystatin 100000 UNIT/ML  suspension Commonly known as:  MYCOSTATIN Take 5 mLs (500,000 Units total) by mouth 4 (four) times daily.   polyethylene glycol packet Commonly known as:  MIRALAX / GLYCOLAX Take 17 g by mouth daily.   senna-docusate 8.6-50 MG tablet Commonly known as:  Senokot-S Take 1 tablet by mouth 2 (two) times daily.   sitaGLIPtin 100 MG tablet Commonly known as:  JANUVIA Take 100 mg by mouth daily.   sulfamethoxazole-trimethoprim 800-160 MG tablet Commonly known as:  BACTRIM DS,SEPTRA DS Take 1 tablet by mouth every 12 (twelve) hours for 7 doses.   thiamine 100 MG tablet Take 1 tablet (100 mg total) by mouth daily.      Allergies  Allergen Reactions  . Penicillins Rash and Other (See Comments)    Tolerated Zosyn Jan 2018  PATIENT HAS HAD A PCN REACTION WITH IMMEDIATE RASH, FACIAL/TONGUE/THROAT SWELLING, SOB, OR LIGHTHEADEDNESS WITH HYPOTENSION:  #  #  #  YES  #  #  #   Has patient had a PCN reaction causing severe rash involving mucus membranes or skin necrosis:NO Has patient had a PCN reaction that required hospitalization NO Has patient had a PCN reaction occurring within the last 10 years:NO   Follow-up Information    Newt Minion, MD Follow up in 2 week(s).   Specialty:  Orthopedic Surgery Contact information: [place] [address] [phone]        Christain Sacramento, MD. Schedule an appointment as soon as possible for a visit in 1 week(s).   Specialty:  Family Medicine Contact information: [address] [place] [address] [phone]            The results of significant diagnostics from this hospitalization (including imaging, microbiology, ancillary and laboratory) are listed below for reference.    Significant Diagnostic Studies: Dg Tibia/fibula Right  Result Date: 10/10/2017 CLINICAL DATA:  Diabetic lower extremity infection EXAM: RIGHT FOOT COMPLETE - 3+ VIEW; RIGHT TIBIA AND FIBULA - 2 VIEW COMPARISON:  None. FINDINGS:  There is an ulcerative wound at the right lateral malleolus soft tissues. There is extensive soft tissue emphysema beginning at the level of the right ankle and extending superiorly along the medial and lateral soft tissues of the right leg to the level of the mid tibia. There is no fracture or dislocation. No osteolysis. Bones are generally osteopenic. IMPRESSION: 1. Ulcerative lesion at the right lateral malleolus with soft tissue emphysema tracking superiorly  within the anterior and lateral leg to the level of the mid tibia. Findings are consistent with gangrenous infection. In the appropriate context, the findings would be compatible with a clinical diagnosis of necrotizing fasciitis. 2. No radiographic evidence of active infectious osteitis. Electronically Signed   By: Ulyses Jarred M.D.   On: 10/10/2017 17:20   US Scrotum  Result Date: 10/19/2017 CLINICAL DATA:  Testicular pain.  Drainage from scrotum. EXAM: SCROTAL ULTRASOUND DOPPLER ULTRASOUND OF THE TESTICLES TECHNIQUE: Complete ultrasound examination of the testicles, epididymis, and other scrotal structures was performed. Color and spectral Doppler ultrasound were also utilized to evaluate blood flow to the testicles. COMPARISON:  None. FINDINGS: Right testicle Measurements: 4.2 x 2.8 x 2.5 cm. The right testicle is mildly heterogeneous with no focal mass. Left testicle Measurements: 3.2 x 2.5 x 2.9 cm. No mass or microlithiasis visualized. Right epididymis:  Normal in size and appearance. Left epididymis:  Normal in size and appearance. Hydrocele:  Small bilateral hydroceles. Varicocele:  None visualized. Pulsed Doppler interrogation of both testes demonstrates skin thickening in the scrotum. IMPRESSION: 1. The right testicle is mildly heterogeneous in echotexture with no focal mass. The mild heterogeneity is nonspecific. No evidence of abscess. Normal blood flow identified in the right testicle. 2. The left testicle is normal in appearance. 3. Normal  bilateral testicular blood flow. No evidence of torsion on this study. 4. Scrotal skin thickening consistent with history. Electronically Signed   By: Dorise Bullion III M.D   On: 10/16/2017 20:27   Dg Foot Complete Right  Result Date: 10/10/2017 CLINICAL DATA:  Diabetic lower extremity infection EXAM: RIGHT FOOT COMPLETE - 3+ VIEW; RIGHT TIBIA AND FIBULA - 2 VIEW COMPARISON:  None. FINDINGS: There is an ulcerative wound at the right lateral malleolus soft tissues. There is extensive soft tissue emphysema beginning at the level of the right ankle and extending superiorly along the medial and lateral soft tissues of the right leg to the level of the mid tibia. There is no fracture or dislocation. No osteolysis. Bones are generally osteopenic. IMPRESSION: 1. Ulcerative lesion at the right lateral malleolus with soft tissue emphysema tracking superiorly within the anterior and lateral leg to the level of the mid tibia. Findings are consistent with gangrenous infection. In the appropriate context, the findings would be compatible with a clinical diagnosis of necrotizing fasciitis. 2. No radiographic evidence of active infectious osteitis. Electronically Signed   By: Ulyses Jarred M.D.   On: 10/10/2017 17:20   US Scrotum Doppler  Result Date: 10/16/2017 CLINICAL DATA:  Testicular pain.  Drainage from scrotum. EXAM: SCROTAL ULTRASOUND DOPPLER ULTRASOUND OF THE TESTICLES TECHNIQUE: Complete ultrasound examination of the testicles, epididymis, and other scrotal structures was performed. Color and spectral Doppler ultrasound were also utilized to evaluate blood flow to the testicles. COMPARISON:  None. FINDINGS: Right testicle Measurements: 4.2 x 2.8 x 2.5 cm. The right testicle is mildly heterogeneous with no focal mass. Left testicle Measurements: 3.2 x 2.5 x 2.9 cm. No mass or microlithiasis visualized. Right epididymis:  Normal in size and appearance. Left epididymis:  Normal in size and appearance. Hydrocele:   Small bilateral hydroceles. Varicocele:  None visualized. Pulsed Doppler interrogation of both testes demonstrates skin thickening in the scrotum. IMPRESSION: 1. The right testicle is mildly heterogeneous in echotexture with no focal mass. The mild heterogeneity is nonspecific. No evidence of abscess. Normal blood flow identified in the right testicle. 2. The left testicle is normal in appearance. 3. Normal bilateral testicular blood  flow. No evidence of torsion on this study. 4. Scrotal skin thickening consistent with history. Electronically Signed   By: Dorise Bullion III M.D   On: 10/16/2017 20:27    Microbiology: No results found for this or any previous visit (from the past 240 hour(s)).   Labs: Basic Metabolic Panel: Recent Labs  Lab 10/15/17 0437 10/15/17 1647 10/17/17 0600 10/19/17 0711  NA 133* 130* 133* 134*  K 3.8 4.2 4.0 4.5  CL 99* 97* 99* 99*  CO2 26 25 26 27   GLUCOSE 158* 339* 134* 172*  BUN 9 13 8 9   CREATININE 0.55* 0.61 0.57* 0.44*  CALCIUM 8.5* 8.2* 8.2* 8.3*   Liver Function Tests: No results for input(s): AST, ALT, ALKPHOS, BILITOT, PROT, ALBUMIN in the last 168 hours. No results for input(s): LIPASE, AMYLASE in the last 168 hours. Recent Labs  Lab 10/14/17 1415  AMMONIA 36*   CBC: Recent Labs  Lab 10/15/17 0437 10/16/17 0632 10/16/17 1601 10/17/17 0600 10/18/17 0511 10/19/17 0711 10/21/17 0615  WBC 20.9* 22.2* 19.6* 16.0* 14.0* 12.2* 13.0*  NEUTROABS 16.3* 18.2*  --  12.3*  --   --  9.3*  HGB 9.3* 9.5* 8.2* 8.2* 8.5* 9.0* 8.7*  HCT 28.9* 29.0* 25.6* 25.6* 26.5* 27.7* 26.7*  MCV 83.8 84.1 84.2 85.6 84.7 84.5 85.9  PLT 764* 717* 700* 732* 779* 731* 762*   Cardiac Enzymes: No results for input(s): CKTOTAL, CKMB, CKMBINDEX, TROPONINI in the last 168 hours. BNP: BNP (last 3 results) No results for input(s): BNP in the last 8760 hours.  ProBNP (last 3 results) No results for input(s): PROBNP in the last 8760 hours.  CBG: Recent Labs  Lab  10/20/17 0732 10/20/17 1119 10/20/17 1655 10/20/17 2059 10/21/17 0800  GLUCAP 219* 207* 171* 176* 164*       Signed:  Alma Friendly, MD [facility] 10/21/2017, 12:03 PM

## 2017-10-21 NOTE — Progress Notes (Signed)
Physical Therapy Treatment Patient Details Name: Andrew [name] MRN: [id number] DOB: 02-05-1946 Today's Date: 10/21/2017    History of Present Illness 72 yo male with onset of gangrene R leg was admitted for AK amputation with issues healing wounds in the past, including needing a skin graft for RUE infection.  Poor results with ABT as well. Had sepsis with cellulitis RLE.   Has history of DM, tobacco abuse and PN complicating his issues as well.      PT Comments    Pt required maximal encouragement for any type of movement. Pt educated on benefits of movement for pain control. Pt refused any bed mobility. Therapist worked to elicit movement in bilateral UE and L LE. Pt with minimal participation and would scream out in pain after 1-2 reps, reporting it hurt in his R LE. Pt with probable underlying cognitive issues contributing to his refusal to change situations causing pain such as laying on top of painful extremity and tendency to become combative with offers for help.  Pt doctor entered at end of therapy session and also could not convince additional movement.  D/c plans remain appropriate.    Follow Up Recommendations  SNF     Equipment Recommendations  Wheelchair (measurements PT)    Recommendations for Other Services       Precautions / Restrictions Precautions Precautions: Fall Precaution Comments: wound vac R stump Restrictions Weight Bearing Restrictions: Yes RLE Weight Bearing: Non weight bearing    Mobility  Bed Mobility Overal bed mobility: Needs Assistance             General bed mobility comments: refused to attempt  Transfers                 General transfer comment: refused to attempt                     Balance Overall balance assessment: History of Falls                                          Cognition Arousal/Alertness: Awake/alert Behavior During Therapy: Flat affect;Agitated;Anxious Overall Cognitive  Status: No family/caregiver present to determine baseline cognitive functioning                                 General Comments: Despite maximal verbal encouragement, pt refused any attempt at bed mobility. Pt would initiate one bout of therapeutic exercise of L LE and on second attempt he would scream out and saying it hurt in his residual limb. Pt eventually pulled sheet up around him and closed eyes stating "I'm trying to get some sleep." Pt positioned in bed with extreme pressure on residual limb, therapist attempted to educate on need to offweight and have pt initiate in weightshifting off painful R side. When PT attempted to assist pt stated firmly "Don't touch me".       Exercises General Exercises - Upper Extremity Digit Composite Flexion: AROM;Right(x2 on second attempt screamed out ) General Exercises - Lower Extremity Ankle Circles/Pumps: PROM;Left;10 reps;Supine Heel Slides: AAROM;Right;Supine(able to achieve once on second attempt screamed out )    General Comments General comments (skin integrity, edema, etc.): Wound vac in place at  R LE surgical site      Pertinent Vitals/Pain Pain Assessment: Faces Faces Pain Scale: Hurts worst  Pain Location: operated leg Pain Descriptors / Indicators: Operative site guarding;Tender Pain Intervention(s): Premedicated before session;Monitored during session;Limited activity within patient's tolerance           PT Goals (current goals can now be found in the care plan section) Acute Rehab PT Goals Patient Stated Goal: home PT Goal Formulation: Patient unable to participate in goal setting Time For Goal Achievement: 10/31/17 Potential to Achieve Goals: Fair Progress towards PT goals: Not progressing toward goals - comment(refusal to move, despite premedication and education)    Frequency    Min 3X/week      PT Plan Current plan remains appropriate       AM-PAC PT "6 Clicks" Daily Activity  Outcome  Measure  Difficulty turning over in bed (including adjusting bedclothes, sheets and blankets)?: Unable Difficulty moving from lying on back to sitting on the side of the bed? : Unable Difficulty sitting down on and standing up from a chair with arms (e.g., wheelchair, bedside commode, etc,.)?: Unable Help needed moving to and from a bed to chair (including a wheelchair)?: Total Help needed walking in hospital room?: Total Help needed climbing 3-5 steps with a railing? : Total 6 Click Score: 6    End of Session   Activity Tolerance: Patient limited by pain Patient left: in bed;with call bell/phone within reach;with bed alarm set Nurse Communication: Mobility status PT Visit Diagnosis: Other abnormalities of gait and mobility (R26.89);History of falling (Z91.81);Pain Pain - Right/Left: Right Pain - part of body: Leg     Time: 5027-7412 PT Time Calculation (min) (ACUTE ONLY): 24 min  Charges:  $Therapeutic Exercise: 8-22 mins                    G Codes:  Functional Assessment Tool Used: AM-PAC 6 Clicks Basic Mobility    Andrew [name] B. Migdalia Dk PT, DPT Acute Rehabilitation  [phone] Pager [phone]     [place] 10/21/2017, 11:59 AM

## 2017-10-21 NOTE — Progress Notes (Signed)
Andrew [name] to be D/C'd Skilled nursing facility per MD order.  Discussed prescriptions and follow up appointments with the patient. Prescriptions given to patient, medication list explained in detail. Pt verbalized understanding. Called report to [employer] and gave report to [place], Therapist, sports.   Allergies as of 10/21/2017      Reactions   Penicillins Rash, Other (See Comments)   Tolerated Zosyn Jan 2018 PATIENT HAS HAD A PCN REACTION WITH IMMEDIATE RASH, FACIAL/TONGUE/THROAT SWELLING, SOB, OR LIGHTHEADEDNESS WITH HYPOTENSION:  #  #  #  YES  #  #  #   Has patient had a PCN reaction causing severe rash involving mucus membranes or skin necrosis:NO Has patient had a PCN reaction that required hospitalization NO Has patient had a PCN reaction occurring within the last 10 years:NO      Medication List    STOP taking these medications   furosemide 20 MG tablet Commonly known as:  LASIX   ibuprofen 200 MG tablet Commonly known as:  ADVIL,MOTRIN   insulin aspart 100 UNIT/ML injection Commonly known as:  NOVOLOG   metFORMIN 500 MG 24 hr tablet Commonly known as:  GLUCOPHAGE-XR   naproxen sodium 220 MG tablet Commonly known as:  ALEVE   oxyCODONE-acetaminophen 5-325 MG tablet Commonly known as:  PERCOCET/ROXICET   simvastatin 40 MG tablet Commonly known as:  ZOCOR     TAKE these medications   amLODipine 5 MG tablet Commonly known as:  NORVASC Take 1 tablet (5 mg total) by mouth daily. What changed:    medication strength  how much to take   aspirin EC 325 MG tablet Take 325 mg by mouth daily.   atorvastatin 10 MG tablet Commonly known as:  LIPITOR Take 1 tablet (10 mg total) by mouth daily at 6 PM.   docusate sodium 100 MG capsule Commonly known as:  COLACE Take 1 capsule (100 mg total) by mouth 2 (two) times daily.   feeding supplement (ENSURE ENLIVE) Liqd Take 237 mLs by mouth daily. What changed:  when to take this   ferrous sulfate 325 (65 FE) MG tablet Take 1  tablet (325 mg total) by mouth 2 (two) times daily with a meal.   fluconazole 100 MG tablet Commonly known as:  DIFLUCAN Take 1 tablet (100 mg total) by mouth daily for 6 days.   gabapentin 100 MG capsule Commonly known as:  NEURONTIN Take 100 mg by mouth 3 (three) times daily.   HYDROcodone-acetaminophen 5-325 MG tablet Commonly known as:  NORCO/VICODIN Take 1-2 tablets by mouth every 4 (four) hours as needed for moderate pain (pain score 4-6). What changed:    how much to take  when to take this  reasons to take this  Another medication with the same name was removed. Continue taking this medication, and follow the directions you see here.   insulin glargine 100 UNIT/ML injection Commonly known as:  LANTUS Inject 0.2 mLs (20 Units total) into the skin daily. What changed:    how much to take  when to take this  Another medication with the same name was removed. Continue taking this medication, and follow the directions you see here.   metoprolol succinate 50 MG 24 hr tablet Commonly known as:  TOPROL-XL Take 50 mg by mouth daily. What changed:  Another medication with the same name was removed. Continue taking this medication, and follow the directions you see here.   multivitamin with minerals Tabs tablet Take 1 tablet by mouth daily.  nicotine 21 mg/24hr patch Commonly known as:  NICODERM CQ - dosed in mg/24 hours Place 1 patch (21 mg total) onto the skin at bedtime.   nystatin 100000 UNIT/ML suspension Commonly known as:  MYCOSTATIN Take 5 mLs (500,000 Units total) by mouth 4 (four) times daily.   polyethylene glycol packet Commonly known as:  MIRALAX / GLYCOLAX Take 17 g by mouth daily.   senna-docusate 8.6-50 MG tablet Commonly known as:  Senokot-S Take 1 tablet by mouth 2 (two) times daily.   sitaGLIPtin 100 MG tablet Commonly known as:  JANUVIA Take 100 mg by mouth daily.   sulfamethoxazole-trimethoprim 800-160 MG tablet Commonly known as:   BACTRIM DS,SEPTRA DS Take 1 tablet by mouth every 12 (twelve) hours for 7 doses.   thiamine 100 MG tablet Take 1 tablet (100 mg total) by mouth daily.       Vitals:   10/21/17 1000 10/21/17 1709  BP: 139/60 140/77  Pulse: 71 99  Resp: 18 18  Temp: 98 F (36.7 C) (!) 97.5 F (36.4 C)  SpO2: 96% 98%    Skin clean, dry and intact without evidence of skin break down, no evidence of skin tears noted. IV catheter discontinued intact. Site without signs and symptoms of complications. Dressing and pressure applied. Pt denies pain at this time. No complaints noted.  An After Visit Summary was printed and given to the patient. Patient escorted via stretcher, and D/C home via [facility].  Emilio Math, RN [facility] Phone [phone]

## 2017-10-21 NOTE — Progress Notes (Signed)
Occupational Therapy Treatment Patient Details Name: Andrew [name] MRN: [id number] DOB: [birth year] Today's Date: 10/21/2017    History of present illness 72 yo male with onset of gangrene R leg was admitted for AK amputation with issues healing wounds in the past, including needing a skin graft for RUE infection.  Poor results with ABT as well. Had sepsis with cellulitis RLE.   Has history of DM, tobacco abuse and PN complicating his issues as well.     OT comments  Pt not progressing towards acute OT goals. Lengthy discussion with pt about pt's goal (no pain and to go home), risks of immobility an improper positioning of R stump (pt leaning onto R side and refused to self-correct). Pt ultimately only doing minimal UB strengthening and some LLE ROM. Pt vocalizing multiple times a distrust of health care professionals and often became agitated with threatening statements/behavior observed at times. Pt presents with both behavioral limitations and impaired cognition limiting progress with OT this session. D/c plan remains appropriate at this time.  Follow Up Recommendations  SNF;Supervision/Assistance - 24 hour    Equipment Recommendations  None recommended by OT;Other (comment)    Recommendations for Other Services      Precautions / Restrictions Precautions Precautions: Fall Precaution Comments: wound vac R stump Restrictions Weight Bearing Restrictions: Yes RLE Weight Bearing: Non weight bearing       Mobility Bed Mobility Overal bed mobility: Needs Assistance             General bed mobility comments: refused to attempt  Transfers                 General transfer comment: refused to attempt    Balance Overall balance assessment: History of Falls                                         ADL either performed or assessed with clinical judgement   ADL Overall ADL's : Needs assistance/impaired                                       General ADL Comments: Lengthy discussion about pt's goals (no pain), pros and cons of therapy. Pt ultimately only doing minimal UB strengthening assessment and some LLE ROM. Discussed that any movement pt can make during the day (shifting position in bed, UB ROM) would be beneficial.      Vision       Perception     Praxis      Cognition Arousal/Alertness: Awake/alert Behavior During Therapy: Flat affect;Agitated;Anxious Overall Cognitive Status: No family/caregiver present to determine baseline cognitive functioning                                 General Comments: Despite maximal verbal encouragement, pt refused any attempt at bed mobility. Pt would initiate one bout of therapeutic exercise of L LE and on second attempt he would scream out and saying it hurt in his residual limb. Pt eventually pulled sheet up around him and closed eyes stating "I'm trying to get some sleep." Pt positioned in bed with extreme pressure on residual limb, therapist attempted to educate on need to offweight and have pt initiate in weightshifting off painful R side. When PT  attempted to assist pt stated firmly "Don't touch me".         Exercises Exercises: Other exercises;General Lower Extremity;General Upper Extremity General Exercises - Upper Extremity Digit Composite Flexion: AROM;Right General Exercises - Lower Extremity Ankle Circles/Pumps: PROM;Left;10 reps;Supine Heel Slides: AAROM;Right;Supine   Shoulder Instructions       General Comments Wound vac in place at  R LE surgical site    Pertinent Vitals/ Pain       Pain Assessment: Faces Faces Pain Scale: Hurts worst Pain Location: operated leg Pain Descriptors / Indicators: Operative site guarding;Tender Pain Intervention(s): Monitored during session;Limited activity within patient's tolerance;Premedicated before session  Home Living                                          Prior  Functioning/Environment              Frequency  Min 2X/week        Progress Toward Goals  OT Goals(current goals can now be found in the care plan section)  Progress towards OT goals: Not progressing toward goals - comment  Acute Rehab OT Goals Patient Stated Goal: no pain OT Goal Formulation: With patient Time For Goal Achievement: 11/02/17 Potential to Achieve Goals: Poor ADL Goals Pt Will Perform Grooming: with min guard assist;sitting Pt Will Perform Upper Body Bathing: sitting;with mod assist Pt Will Perform Lower Body Bathing: with max assist;with mod assist;sitting/lateral leans Pt Will Perform Upper Body Dressing: with mod assist;sitting Pt Will Transfer to Toilet: with max assist;with +2 assist;squat pivot transfer;stand pivot transfer;anterior/posterior transfer Additional ADL Goal #1: Pt will complete bed mobility with mod A to sit EOB for selfcare tasks  Plan Discharge plan remains appropriate    Co-evaluation    PT/OT/SLP Co-Evaluation/Treatment: Yes Reason for Co-Treatment: For patient/therapist safety;Necessary to address cognition/behavior during functional activity;To address functional/ADL transfers   OT goals addressed during session: ADL's and self-care      AM-PAC PT "6 Clicks" Daily Activity     Outcome Measure   Help from another person eating meals?: A Little Help from another person taking care of personal grooming?: A Little Help from another person toileting, which includes using toliet, bedpan, or urinal?: Total Help from another person bathing (including washing, rinsing, drying)?: Total Help from another person to put on and taking off regular upper body clothing?: A Lot Help from another person to put on and taking off regular lower body clothing?: Total 6 Click Score: 11    End of Session    OT Visit Diagnosis: History of falling (Z91.81);Muscle weakness (generalized) (M62.81);Other abnormalities of gait and mobility  (R26.89);Pain Pain - Right/Left: Right Pain - part of body: Leg   Activity Tolerance Patient limited by pain;Treatment limited secondary to agitation   Patient Left in bed   Nurse Communication Mobility status        Time: 1047-1110 OT Time Calculation (min): 23 min  Charges: OT General Charges $OT Visit: 1 Visit OT Treatments $Self Care/Home Management : 8-22 mins     Hortencia Pilar 10/21/2017, 1:31 PM

## 2017-10-22 ENCOUNTER — Encounter: Payer: Self-pay | Admitting: Adult Health

## 2017-10-22 ENCOUNTER — Non-Acute Institutional Stay (SKILLED_NURSING_FACILITY): Payer: Medicare Other | Admitting: Adult Health

## 2017-10-22 DIAGNOSIS — E785 Hyperlipidemia, unspecified: Secondary | ICD-10-CM

## 2017-10-22 DIAGNOSIS — D509 Iron deficiency anemia, unspecified: Secondary | ICD-10-CM | POA: Diagnosis not present

## 2017-10-22 DIAGNOSIS — B370 Candidal stomatitis: Secondary | ICD-10-CM

## 2017-10-22 DIAGNOSIS — E1169 Type 2 diabetes mellitus with other specified complication: Secondary | ICD-10-CM

## 2017-10-22 DIAGNOSIS — I1311 Hypertensive heart and chronic kidney disease without heart failure, with stage 5 chronic kidney disease, or end stage renal disease: Secondary | ICD-10-CM | POA: Insufficient documentation

## 2017-10-22 DIAGNOSIS — I110 Hypertensive heart disease with heart failure: Secondary | ICD-10-CM | POA: Diagnosis not present

## 2017-10-22 DIAGNOSIS — E1143 Type 2 diabetes mellitus with diabetic autonomic (poly)neuropathy: Secondary | ICD-10-CM | POA: Diagnosis not present

## 2017-10-22 DIAGNOSIS — E1151 Type 2 diabetes mellitus with diabetic peripheral angiopathy without gangrene: Secondary | ICD-10-CM | POA: Diagnosis not present

## 2017-10-22 DIAGNOSIS — I5032 Chronic diastolic (congestive) heart failure: Secondary | ICD-10-CM | POA: Diagnosis not present

## 2017-10-22 DIAGNOSIS — E43 Unspecified severe protein-calorie malnutrition: Secondary | ICD-10-CM | POA: Diagnosis not present

## 2017-10-22 DIAGNOSIS — N186 End stage renal disease: Secondary | ICD-10-CM | POA: Insufficient documentation

## 2017-10-22 NOTE — Progress Notes (Signed)
Location:   [facility] Room Number: [place] of Service:  SNF (31)   CODE STATUS: Full Code  Allergies  Allergen Reactions  . Penicillins Rash and Other (See Comments)    Tolerated Zosyn Jan 2018  PATIENT HAS HAD A PCN REACTION WITH IMMEDIATE RASH, FACIAL/TONGUE/THROAT SWELLING, SOB, OR LIGHTHEADEDNESS WITH HYPOTENSION:  #  #  #  YES  #  #  #   Has patient had a PCN reaction causing severe rash involving mucus membranes or skin necrosis:NO Has patient had a PCN reaction that required hospitalization NO Has patient had a PCN reaction occurring within the last 34 years:NO    Chief Complaint  Patient presents with  . Hospitalization Follow-up    Hospital follow up    HPI:  He is a 72 year old who has been hospitalized for sepsis due to right leg cellulitis; gangrene/eschar/necrosis and is status post right aka. He was initially admitted with a wbc in the 30's. He was started on IV vanc; however; did require right aka. He did develop scrotal drainage and was placed on septra ds. His ultrasound did not reveal an abscess. His hgb has been stable in the 8-9 range. He is being treated for oral candidiasis. He is here for short term rehab; he has told me that his goal is to return back home. He is somewhat resistant to moving; and did require a great deal of encouragement for his to get out of bed this morning. He does have right stump pain which is being adequately treated. There are no reports of fevers; no change in appetite. He will continue to be followed for his chronic illnesses including: hypertension; diabetes and anemia.    Past Medical History:  Diagnosis Date  . Blind right eye    S/P shotgun  . Bursitis    right Olecranon  . Chronic bronchitis ([place])   . Diabetic neuropathy ([place])   . Hyperlipemia   . Hypertension   . 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 Type II diabetes mellitus ([place])   . Wound cellulitis    right  elbow open    Past Surgical History:  Procedure Laterality Date  . AMPUTATION Right 10/16/2017   Procedure: RIGHT ABOVE KNEE AMPUTATION;  Surgeon: Newt Minion, MD;  Location: [place];  Service: Orthopedics;  Laterality: Right;  . APPLICATION OF A-CELL OF EXTREMITY Right 10/15/2016   Procedure: APPLICATION OF A-CELL OF EXTREMITY;  Surgeon: Loel Lofty Dillingham, DO;  Location: [place];  Service: Plastics;  Laterality: Right;  . APPLICATION OF A-CELL OF EXTREMITY Right 11/19/2016   Procedure: APPLICATION OF A-CELL OF EXTREMITY;  Surgeon: Loel Lofty Dillingham, DO;  Location: [place];  Service: Plastics;  Laterality: Right;  . APPLICATION OF A-CELL OF EXTREMITY Right 01/13/2017   Procedure: APPLICATION OF A-CELL OF EXTREMITY;  Surgeon: Wallace Going, DO;  Location: [place];  Service: Plastics;  Laterality: Right;  . APPLICATION OF WOUND VAC Right 10/09/2016   Procedure: APPLICATION OF WOUND VAC;  Surgeon: Leandrew Koyanagi, MD;  Location: [place];  Service: Orthopedics;  Laterality: Right;  . APPLICATION OF WOUND VAC Right 10/15/2016   Procedure: WOUND VAC CHANGE;  Surgeon: Loel Lofty Dillingham, DO;  Location: [place];  Service: Plastics;  Laterality: Right;  . APPLICATION OF WOUND VAC Right 11/19/2016   Procedure: APPLICATION OF WOUND VAC;  Surgeon: Loel Lofty Dillingham, DO;  Location: [place];  Service: Plastics;  Laterality: Right;  .  GRAFT APPLICATION Right 6/56/8127   Procedure: GRAFT APPLICATION  RIGHT UPPER ARM FROM DONOR SITE;  Surgeon: Wallace Going, DO;  Location: [place];  Service: Plastics;  Laterality: Right;  . I&D EXTREMITY Right 10/09/2016   Procedure: IRRIGATION AND DEBRIDEMENT EXTREMITY RIGHT ARM WOUND;  Surgeon: Leandrew Koyanagi, MD;  Location: [place];  Service: Orthopedics;  Laterality: Right;  . I&D EXTREMITY N/A 10/11/2016   Procedure: IRRIGATION AND DEBRIDEMENT EXTREMITY RIGHT ARM WOUND;  Surgeon: Leandrew Koyanagi, MD;  Location: [place];  Service: Orthopedics;  Laterality: N/A;  . I&D EXTREMITY Right  10/15/2016   Procedure: IRRIGATION AND DEBRIDEMENT EXTREMITY/RIGHT ELBOW;  Surgeon: Loel Lofty Dillingham, DO;  Location: [place];  Service: Plastics;  Laterality: Right;  . INCISION AND DRAINAGE OF WOUND Right 10/09/2016   arm  . INCISION AND DRAINAGE OF WOUND Right 11/19/2016   Procedure: IRRIGATION AND DEBRIDEMENT WOUND;  Surgeon: Loel Lofty Dillingham, DO;  Location: [place];  Service: Plastics;  Laterality: Right;  . SKIN SPLIT GRAFT Right 01/13/2017   Procedure: SKIN GRAFT SPLIT THICKNESS;  Surgeon: Wallace Going, DO;  Location: [place];  Service: Plastics;  Laterality: Right;    Social History   Socioeconomic History  . Marital status: Widowed    Spouse name: Not on file  . Number of children: Not on file  . Years of education: Not on file  . Highest education level: Not on file  Social Needs  . Financial resource strain: Not on file  . Food insecurity - worry: Not on file  . Food insecurity - inability: Not on file  . Transportation needs - medical: Not on file  . Transportation needs - non-medical: Not on file  Occupational History  . Not on file  Tobacco Use  . Smoking status: Current Every Day Smoker    Packs/day: 1.50    Years: 63.00    Pack years: 94.50    Types: Cigarettes  . Smokeless tobacco: Former Systems developer    Types: Chew  Substance and Sexual Activity  . Alcohol use: Yes    Comment: 01/12/2017 "nothing in 3 years"  . Drug use: No  . Sexual activity: Not on file  Other Topics Concern  . Not on file  Social History Narrative  . Not on file   Family History  Problem Relation Age of Onset  . Diabetes Mellitus II Mother   . Diabetes Mellitus II Father   . Diabetes Mellitus II Sister   . Diabetes Mellitus II Sister       VITAL SIGNS BP 138/76   Pulse 92   Temp 97.6 F (36.4 C)   Resp 18   Ht 5' 7"  (1.702 m)   Wt 160 lb 15 oz (73 kg)   SpO2 99%   BMI 25.21 kg/m   Outpatient Encounter Medications as of 10/22/2017  Medication Sig  . amLODipine (NORVASC) 5  MG tablet Take 1 tablet (5 mg total) by mouth daily.  Marland Kitchen aspirin EC 325 MG tablet Take 325 mg by mouth daily.  Marland Kitchen atorvastatin (LIPITOR) 10 MG tablet Take 1 tablet (10 mg total) by mouth daily at 6 PM.  . docusate sodium (COLACE) 100 MG capsule Take 1 capsule (100 mg total) by mouth 2 (two) times daily.  Marland Kitchen ENSURE (ENSURE) Take 237 mLs by mouth daily.  . ferrous sulfate 325 (65 FE) MG tablet Take 1 tablet (325 mg total) by mouth 2 (two) times daily with a meal.  . fluconazole (DIFLUCAN) 100  MG tablet Take 1 tablet (100 mg total) by mouth daily for 6 days.  Marland Kitchen gabapentin (NEURONTIN) 100 MG capsule Take 100 mg by mouth 3 (three) times daily.  Marland Kitchen HYDROcodone-acetaminophen (NORCO/VICODIN) 5-325 MG tablet Take 1-2 tablets by mouth every 4 (four) hours as needed for moderate pain (pain score 4-6).  Marland Kitchen insulin glargine (LANTUS) 100 UNIT/ML injection Inject 0.2 mLs (20 Units total) into the skin daily.  . metoprolol succinate (TOPROL-XL) 50 MG 24 hr tablet Take 50 mg by mouth daily.  . Multiple Vitamin (MULTIVITAMIN WITH MINERALS) TABS tablet Take 1 tablet by mouth daily.  . nicotine (NICODERM CQ - DOSED IN MG/24 HOURS) 21 mg/24hr patch Place 1 patch (21 mg total) onto the skin at bedtime.  Marland Kitchen nystatin (MYCOSTATIN) 100000 UNIT/ML suspension Take 5 mLs (500,000 Units total) by mouth 4 (four) times daily.  . polyethylene glycol (MIRALAX / GLYCOLAX) packet Take 17 g by mouth daily.  Marland Kitchen senna-docusate (SENOKOT-S) 8.6-50 MG tablet Take 1 tablet by mouth 2 (two) times daily.  . sitaGLIPtin (JANUVIA) 100 MG tablet Take 100 mg by mouth daily.  Marland Kitchen sulfamethoxazole-trimethoprim (BACTRIM DS,SEPTRA DS) 800-160 MG tablet Take 1 tablet by mouth every 12 (twelve) hours for 7 doses.  Marland Kitchen thiamine 100 MG tablet Take 1 tablet (100 mg total) by mouth daily.  . [DISCONTINUED] feeding supplement, ENSURE ENLIVE, (ENSURE ENLIVE) LIQD Take 237 mLs by mouth daily. (Patient not taking: Reported on 10/22/2017)   No facility-administered  encounter medications on file as of 10/22/2017.      SIGNIFICANT DIAGNOSTIC EXAMS  TODAY:   10-10-17: right foot x-ray:  1. Ulcerative lesion at the right lateral malleolus with soft tissue emphysema tracking superiorly within the anterior and lateral leg to the level of the mid tibia. Findings are consistent with gangrenous infection. In the appropriate context, the findings would be compatible with a clinical diagnosis of necrotizing fasciitis. 2. No radiographic evidence of active infectious osteitis.  10-10-17: right tibia/fibula x-ray:  1. Ulcerative lesion at the right lateral malleolus with soft tissue emphysema tracking superiorly within the anterior and lateral leg to the level of the mid tibia. Findings are consistent with gangrenous infection. In the appropriate context, the findings would be compatible with a clinical diagnosis of necrotizing fasciitis. 2. No radiographic evidence of active infectious osteitis.   10-16-17: ultrasound scrotum doppler: 1. The right testicle is mildly heterogeneous in echotexture with no focal mass. The mild heterogeneity is nonspecific. No evidence of abscess. Normal blood flow identified in the right testicle. 2. The left testicle is normal in appearance. 3. Normal bilateral testicular blood flow. No evidence of torsion on this study. 4. Scrotal skin thickening consistent with history.   LABS REVIEWED: TODAY:   10-10-17: wbc 34.6; hgb 11.3; hct 33.6; mcv 83.2; plt 670; glucose 295; bun 14; creat 0.79; k+ 4.5; na++ 129; ca 8.8; ast 50; albumin 2.4; blood culture: no growth; CRP 13.5; sed rate 92 10-11-17: hgb a1c 9.8; chol 72; ldl 38; trig 78; hdl 18; pre-albumin <5 10-13-17: wbc 26.9; hgb 10.0; hct 30.1; mcv 84.8; plt 737; glucose 159; bun 7; creat 0.49; k+ 3.5; na++ 132; ca 8.3 10-14-17: wbc 24.8; hgb 9.9; hct 30.0; mcv 83.6; plt 752; glucose 231; bun 9; creat 0.54; k+ 3.7; na++ 133; ca 8.5; ast 16; alt 26; alk phos 142; albumin 1.9; ammonia 36 10-16-17:  wbc 16.0; hgb 8.2; hct 25.6; mcv 85.6; plt 7.32; k+ 134; bun 8; creat 0.58; k+ 4.0; na++ 133; ca 8.2 10-18-17: wbc  14.0; hgb 8.5; hct 26.5; mcv 84.7;; plt 779; vit B 12: 463; folate 18.9; iron 16; tibc 118; ferritin 312 10-21-17: wbc 13.0; hgb 8.7; hct 26.7; mcv 85.9; plt 762    Review of Systems  Reason unable to perform ROS: poor historian   Constitutional: Negative for malaise/fatigue.  Respiratory: Negative for cough and shortness of breath.   Cardiovascular: Negative for chest pain, palpitations and leg swelling.  Gastrointestinal: Negative for abdominal pain and constipation.  Musculoskeletal: Positive for myalgias. Negative for back pain and joint pain.       Right stump pain   Skin: Negative.   Neurological: Negative for dizziness.  Psychiatric/Behavioral: The patient is not nervous/anxious.    Physical Exam  Constitutional: No distress.  Frail   Eyes:  Right eye blind   Neck: No thyromegaly present.  Cardiovascular: Normal rate, regular rhythm and intact distal pulses.  Murmur heard. 1/6 Left pedal pulse present   Pulmonary/Chest: Effort normal and breath sounds normal. No respiratory distress.  Abdominal: Soft. Bowel sounds are normal. He exhibits no distension. There is no tenderness.  Musculoskeletal: He exhibits no edema.  Is status post right aka Status post right thumb amputation Is able to move all extremities   Lymphadenopathy:    He has no cervical adenopathy.  Neurological: He is alert.  Skin: Skin is warm and dry. He is not diaphoretic. There is pallor.  Has scabs on bilateral hands Right stump without signs of infection present       ASSESSMENT/ PLAN:  TODAY:   1. Hypertensive heart disease, benign with chf: stable: b/b 138/76: will continue norvasc 5 mg daily;toprol xl 50 mg daily   and asa 325 mg daily   2. Chronic diastolic CHF: stable will continue to monitor his status.   3. 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 Sharol Given; will continue vicodin 5/35 mg 1 or 2 tabs every 4 hours as needed for pain.   4. Dyslipidemia associated with type 2 diabetes mellitus: stable ldl 38 will continue lipitor 10 mg daily   5. Diabetic autonomic neuropathy associated with type 2 diabetes mellitus: stable will continue neurontin  100 mg three times daily   6. DM type 2 with peripheral vascular complications: without change hgb a1c 9.8 will continue lantus 20 units nightly and januvia 100 mg daily will monitor   is on asa and statin  7. Chronic iron deficiency anemia: stable hgb 8.7; iron 16 will continue iron twice daily   8. Chronic constipation: stable will continue senna s twice daily; miralax daily colace twice daily  9. Oral candidiasis: without change will continue nystatin swish and spit and diflucan 100 mg daily through 10-28-17.   10. Scrotal infection: stable will complete septra ds twice daily for 7 more doses  11. Tobacco abuse disorder: without change will continue nicotine patch 21 mg daily  12. Severe protein calorie malnutrition: without changes albumin 1.9 with pre-albumin <5 will make ensure twice daily and will being prostat 30 cc twice daily and will monitor  Will check cbc;  Patient will need to follow up with Dr. Sharol Given in 2 weeks.      MD is aware of resident's narcotic use and is in agreement with current plan of care. We will attempt to wean resident as apropriate   Ok Edwards NP [facility]  Contact [phone] Monday through Friday 8am- 5pm  After hours call [phone] '

## 2017-10-25 ENCOUNTER — Encounter: Payer: Self-pay | Admitting: Internal Medicine

## 2017-10-25 ENCOUNTER — Non-Acute Institutional Stay (SKILLED_NURSING_FACILITY): Payer: Medicare Other | Admitting: Internal Medicine

## 2017-10-25 DIAGNOSIS — D509 Iron deficiency anemia, unspecified: Secondary | ICD-10-CM | POA: Diagnosis not present

## 2017-10-25 DIAGNOSIS — N5089 Other specified disorders of the male genital organs: Secondary | ICD-10-CM

## 2017-10-25 DIAGNOSIS — I5032 Chronic diastolic (congestive) heart failure: Secondary | ICD-10-CM | POA: Diagnosis not present

## 2017-10-25 DIAGNOSIS — Z89611 Acquired absence of right leg above knee: Secondary | ICD-10-CM

## 2017-10-25 DIAGNOSIS — E1143 Type 2 diabetes mellitus with diabetic autonomic (poly)neuropathy: Secondary | ICD-10-CM

## 2017-10-25 DIAGNOSIS — E43 Unspecified severe protein-calorie malnutrition: Secondary | ICD-10-CM

## 2017-10-25 DIAGNOSIS — I739 Peripheral vascular disease, unspecified: Secondary | ICD-10-CM | POA: Diagnosis not present

## 2017-10-25 LAB — CBC AND DIFFERENTIAL
HEMATOCRIT: 28 — AB (ref 41–53)
HEMOGLOBIN: 9.1 — AB (ref 13.5–17.5)
NEUTROS ABS: 9
PLATELETS: 701 — AB (ref 150–399)
WBC: 13.3
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
## 2017-10-26 ENCOUNTER — Non-Acute Institutional Stay (SKILLED_NURSING_FACILITY): Payer: Medicare Other | Admitting: Adult Health

## 2017-10-26 ENCOUNTER — Encounter: Payer: Self-pay | Admitting: Adult Health

## 2017-10-26 DIAGNOSIS — E1151 Type 2 diabetes mellitus with diabetic peripheral angiopathy without gangrene: Secondary | ICD-10-CM | POA: Diagnosis not present

## 2017-10-26 LAB — POCT ERYTHROCYTE SEDIMENTATION RATE, NON-AUTOMATED: Sed Rate: 95

## 2017-10-26 NOTE — Progress Notes (Signed)
Location:   [facility] Room Number: [place] of Service:  SNF (31)   CODE STATUS: Full Code  Allergies  Allergen Reactions  . Penicillins Rash and Other (See Comments)    Tolerated Zosyn Jan 2018  PATIENT HAS HAD A PCN REACTION WITH IMMEDIATE RASH, FACIAL/TONGUE/THROAT SWELLING, SOB, OR LIGHTHEADEDNESS WITH HYPOTENSION:  #  #  #  YES  #  #  #   Has patient had a PCN reaction causing severe rash involving mucus membranes or skin necrosis:NO Has patient had a PCN reaction that required hospitalization NO Has patient had a PCN reaction occurring within the last 55 years:NO    Chief Complaint  Patient presents with  . Acute Visit    Diabetes Mellitus    HPI:  He has been having high cbg readings. He was started on novolog yesterday. Today his readings are improving. He is tolerating the new medication without difficulty. There are no reports of excessive hunger or thirst. He denies anxiety present. There are no nursing concerns at this time.   Past Medical History:  Diagnosis Date  . Blind right eye    S/P shotgun  . Bursitis    right Olecranon  . Chronic bronchitis ([place])   . Diabetic neuropathy ([place])   . Hyperlipemia   . Hypertension   . 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 Type II diabetes mellitus ([place])   . Wound cellulitis    right elbow open    Past Surgical History:  Procedure Laterality Date  . AMPUTATION Right 10/16/2017   Procedure: RIGHT ABOVE KNEE AMPUTATION;  Surgeon: Newt Minion, MD;  Location: [place];  Service: Orthopedics;  Laterality: Right;  . APPLICATION OF A-CELL OF EXTREMITY Right 10/15/2016   Procedure: APPLICATION OF A-CELL OF EXTREMITY;  Surgeon: Loel Lofty Dillingham, DO;  Location: [place];  Service: Plastics;  Laterality: Right;  . APPLICATION OF A-CELL OF EXTREMITY Right 11/19/2016   Procedure: APPLICATION OF A-CELL OF EXTREMITY;  Surgeon: Loel Lofty Dillingham, DO;  Location: [place];  Service:  Plastics;  Laterality: Right;  . APPLICATION OF A-CELL OF EXTREMITY Right 01/13/2017   Procedure: APPLICATION OF A-CELL OF EXTREMITY;  Surgeon: Wallace Going, DO;  Location: [place];  Service: Plastics;  Laterality: Right;  . APPLICATION OF WOUND VAC Right 10/09/2016   Procedure: APPLICATION OF WOUND VAC;  Surgeon: Leandrew Koyanagi, MD;  Location: [place];  Service: Orthopedics;  Laterality: Right;  . APPLICATION OF WOUND VAC Right 10/15/2016   Procedure: WOUND VAC CHANGE;  Surgeon: Loel Lofty Dillingham, DO;  Location: [place];  Service: Plastics;  Laterality: Right;  . APPLICATION OF WOUND VAC Right 11/19/2016   Procedure: APPLICATION OF WOUND VAC;  Surgeon: Loel Lofty Dillingham, DO;  Location: [place];  Service: Plastics;  Laterality: Right;  . GRAFT APPLICATION Right 5/95/6387   Procedure: GRAFT APPLICATION  RIGHT UPPER ARM FROM DONOR SITE;  Surgeon: Wallace Going, DO;  Location: [place];  Service: Plastics;  Laterality: Right;  . I&D EXTREMITY Right 10/09/2016   Procedure: IRRIGATION AND DEBRIDEMENT EXTREMITY RIGHT ARM WOUND;  Surgeon: Leandrew Koyanagi, MD;  Location: [place];  Service: Orthopedics;  Laterality: Right;  . I&D EXTREMITY N/A 10/11/2016   Procedure: IRRIGATION AND DEBRIDEMENT EXTREMITY RIGHT ARM WOUND;  Surgeon: Leandrew Koyanagi, MD;  Location: [place];  Service: Orthopedics;  Laterality: N/A;  . I&D EXTREMITY Right 10/15/2016   Procedure: IRRIGATION AND DEBRIDEMENT EXTREMITY/RIGHT ELBOW;  Surgeon: Loel Lofty Dillingham, DO;  Location: [place];  Service: Plastics;  Laterality: Right;  . INCISION AND DRAINAGE OF WOUND Right 10/09/2016   arm  . INCISION AND DRAINAGE OF WOUND Right 11/19/2016   Procedure: IRRIGATION AND DEBRIDEMENT WOUND;  Surgeon: Loel Lofty Dillingham, DO;  Location: [place];  Service: Plastics;  Laterality: Right;  . SKIN SPLIT GRAFT Right 01/13/2017   Procedure: SKIN GRAFT SPLIT THICKNESS;  Surgeon: Wallace Going, DO;  Location: [place];  Service: Plastics;  Laterality: Right;     Social History   Socioeconomic History  . Marital status: Widowed    Spouse name: Not on file  . Number of children: Not on file  . Years of education: Not on file  . Highest education level: Not on file  Social Needs  . Financial resource strain: Not on file  . Food insecurity - worry: Not on file  . Food insecurity - inability: Not on file  . Transportation needs - medical: Not on file  . Transportation needs - non-medical: Not on file  Occupational History  . Not on file  Tobacco Use  . Smoking status: Current Every Day Smoker    Packs/day: 1.50    Years: 63.00    Pack years: 94.50    Types: Cigarettes  . Smokeless tobacco: Former Systems developer    Types: Chew  Substance and Sexual Activity  . Alcohol use: Yes    Comment: 01/12/2017 "nothing in 3 years"  . Drug use: No  . Sexual activity: Not on file  Other Topics Concern  . Not on file  Social History Narrative  . Not on file   Family History  Problem Relation Age of Onset  . Diabetes Mellitus II Mother   . Diabetes Mellitus II Father   . Diabetes Mellitus II Sister   . Diabetes Mellitus II Sister       VITAL SIGNS BP 130/80   Pulse 80   Temp 98.7 F (37.1 C)   Resp 16   Ht _0  (1.702 m)   Wt 147 lb (66.7 kg)   SpO2 94%   BMI 23.02 kg/m   Outpatient Encounter Medications as of 10/26/2017  Medication Sig  . amLODipine (NORVASC) 5 MG tablet Take 1 tablet (5 mg total) by mouth daily.  Marland Kitchen aspirin EC 325 MG tablet Take 325 mg by mouth daily.  Marland Kitchen atorvastatin (LIPITOR) 10 MG tablet Take 1 tablet (10 mg total) by mouth daily at 6 PM.  . docusate sodium (COLACE) 100 MG capsule Take 1 capsule (100 mg total) by mouth 2 (two) times daily.  Marland Kitchen ENSURE (ENSURE) Take 237 mLs by mouth daily.  . ferrous sulfate 325 (65 FE) MG tablet Take 1 tablet (325 mg total) by mouth 2 (two) times daily with a meal.  . fluconazole (DIFLUCAN) 100 MG tablet Take 1 tablet (100 mg total) by mouth daily for 6 days.  Marland Kitchen gabapentin (NEURONTIN)  100 MG capsule Take 100 mg by mouth 3 (three) times daily.  Marland Kitchen HYDROcodone-acetaminophen (NORCO/VICODIN) 5-325 MG tablet Take 1-2 tablets by mouth every 4 (four) hours as needed for moderate pain (pain score 4-6).  Marland Kitchen insulin aspart (NOVOLOG PENFILL) cartridge Inject as per sliding scale: 0 - 149 = 0 units 150 - 400 = 5 units Call provider is less than or equal to 60 or greater than 400  . insulin glargine (LANTUS) 100 UNIT/ML injection Inject 0.2 mLs (20 Units total) into the skin daily.  . metoprolol succinate (  TOPROL-XL) 50 MG 24 hr tablet Take 50 mg by mouth daily.  . Multiple Vitamin (MULTIVITAMIN WITH MINERALS) TABS tablet Take 1 tablet by mouth daily.  . nicotine (NICODERM CQ - DOSED IN MG/24 HOURS) 21 mg/24hr patch Place 1 patch (21 mg total) onto the skin at bedtime.  Marland Kitchen nystatin (MYCOSTATIN) 100000 UNIT/ML suspension Take 5 mLs (500,000 Units total) by mouth 4 (four) times daily.  . polyethylene glycol (MIRALAX / GLYCOLAX) packet Take 17 g by mouth daily.  Marland Kitchen senna-docusate (SENOKOT-S) 8.6-50 MG tablet Take 1 tablet by mouth 2 (two) times daily.  . sitaGLIPtin (JANUVIA) 100 MG tablet Take 100 mg by mouth daily.  Marland Kitchen sulfamethoxazole-trimethoprim (BACTRIM DS,SEPTRA DS) 800-160 MG tablet Take 1 tablet by mouth 2 (two) times daily.  Marland Kitchen thiamine 100 MG tablet Take 1 tablet (100 mg total) by mouth daily.   No facility-administered encounter medications on file as of 10/26/2017.      SIGNIFICANT DIAGNOSTIC EXAMS  PREVIOUS:   10-10-17: right foot x-ray:  1. Ulcerative lesion at the right lateral malleolus with soft tissue emphysema tracking superiorly within the anterior and lateral leg to the level of the mid tibia. Findings are consistent with gangrenous infection. In the appropriate context, the findings would be compatible with a clinical diagnosis of necrotizing fasciitis. 2. No radiographic evidence of active infectious osteitis.  10-10-17: right tibia/fibula x-ray:  1. Ulcerative lesion  at the right lateral malleolus with soft tissue emphysema tracking superiorly within the anterior and lateral leg to the level of the mid tibia. Findings are consistent with gangrenous infection. In the appropriate context, the findings would be compatible with a clinical diagnosis of necrotizing fasciitis. 2. No radiographic evidence of active infectious osteitis.   10-16-17: ultrasound scrotum doppler: 1. The right testicle is mildly heterogeneous in echotexture with no focal mass. The mild heterogeneity is nonspecific. No evidence of abscess. Normal blood flow identified in the right testicle. 2. The left testicle is normal in appearance. 3. Normal bilateral testicular blood flow. No evidence of torsion on this study. 4. Scrotal skin thickening consistent with history.   NO NEW EXAMS   LABS REVIEWED: PREVIOUS:   10-10-17: wbc 34.6; hgb 11.3; hct 33.6; mcv 83.2; plt 670; glucose 295; bun 14; creat 0.79; k+ 4.5; na++ 129; ca 8.8; ast 50; albumin 2.4; blood culture: no growth; CRP 13.5; sed rate 92 10-11-17: hgb a1c 9.8; chol 72; ldl 38; trig 78; hdl 18; pre-albumin <5 10-13-17: wbc 26.9; hgb 10.0; hct 30.1; mcv 84.8; plt 737; glucose 159; bun 7; creat 0.49; k+ 3.5; na++ 132; ca 8.3 10-14-17: wbc 24.8; hgb 9.9; hct 30.0; mcv 83.6; plt 752; glucose 231; bun 9; creat 0.54; k+ 3.7; na++ 133; ca 8.5; ast 16; alt 26; alk phos 142; albumin 1.9; ammonia 36 10-16-17: wbc 16.0; hgb 8.2; hct 25.6; mcv 85.6; plt 7.32; k+ 134; bun 8; creat 0.58; k+ 4.0; na++ 133; ca 8.2 10-18-17: wbc 14.0; hgb 8.5; hct 26.5; mcv 84.7;; plt 779; vit B 12: 463; folate 18.9; iron 16; tibc 118; ferritin 312 10-21-17: wbc 13.0; hgb 8.7; hct 26.7; mcv 85.9; plt 762  NO NEW LABS.     Review of Systems  Reason unable to perform ROS: poor historian   Constitutional: Negative for malaise/fatigue.  Respiratory: Negative for cough.   Cardiovascular: Negative for chest pain and leg swelling.  Gastrointestinal: Negative for abdominal pain  and constipation.  Musculoskeletal: Positive for myalgias. Negative for back pain and joint pain.  Right stump pain   Skin: Negative.   Neurological: Negative for dizziness.  Endo/Heme/Allergies: Negative for polydipsia.  Psychiatric/Behavioral: The patient is not nervous/anxious.      Physical Exam  Constitutional: No distress.  Frail   Eyes:  Blind right eye  Neck: No thyromegaly present.  Cardiovascular: Normal rate, regular rhythm and intact distal pulses.  Murmur heard. 1/6  Pulmonary/Chest: Effort normal and breath sounds normal. No respiratory distress.  Abdominal: Soft. Bowel sounds are normal. He exhibits no distension.  Musculoskeletal: He exhibits no edema.  Is status post right aka Status post right thumb amputation Is able to move all extremities    Lymphadenopathy:    He has no cervical adenopathy.  Neurological: He is alert.  Skin: Skin is warm and dry. He is not diaphoretic. There is pallor.  Has scabs on bilateral hands Right stump without signs of infection present       ASSESSMENT/ PLAN:  TODAY:   1. DM type 2 with peripheral vascular complications: without change hgb a1c 9.8 will continue lantus 20 units nightly and januvia 100 mg daily will continue novolog 5 units for cbg >=150 with meals.  will monitor   is on asa and statin   MD is aware of resident's narcotic use and is in agreement with current plan of care. We will attempt to wean resident as apropriate    Ok Edwards NP [facility]  Contact [phone] Monday through Friday 8am- 5pm  After hours call [phone]

## 2017-10-28 ENCOUNTER — Ambulatory Visit (INDEPENDENT_AMBULATORY_CARE_PROVIDER_SITE_OTHER): Payer: Medicare Other | Admitting: Orthopedic Surgery

## 2017-10-28 ENCOUNTER — Encounter (INDEPENDENT_AMBULATORY_CARE_PROVIDER_SITE_OTHER): Payer: Self-pay | Admitting: Orthopedic Surgery

## 2017-10-28 VITALS — Ht 67.0 in | Wt 147.0 lb

## 2017-10-28 DIAGNOSIS — Z89611 Acquired absence of right leg above knee: Secondary | ICD-10-CM

## 2017-10-28 NOTE — Progress Notes (Signed)
Office Visit Note   Patient: Andrew [name]           Date of Birth: [birth year]           MRN: [id number] Visit Date: 10/28/2017              Requested by: Christain Sacramento, [address] [place], [place] [address] PCP: Christain Sacramento, MD  Chief Complaint  Patient presents with  . Right Leg - Routine Post Op    10/16/17 right AKA      HPI: Patient is a 72 year old gentleman resident of skilled nursing who is total assist for transfers who presents status post right above-knee amputation approximately 2 weeks out.  Assessment & Plan: Visit Diagnoses:  1. Amputee, above knee, right ([place])     Plan: Continue with wound cleansing daily, continue dry dressing changes daily.  Discussed the possibility of need for revision surgery if he has any further ischemic changes.  Follow-Up Instructions: Return in about 2 weeks (around 11/11/2017).   Ortho Exam  Patient is alert, oriented, no adenopathy, well-dressed, normal affect, normal respiratory effort. Patient is total assistance for transfers.  The transtibial amputation incision is well approximated there is no redness no cellulitis no drainage.  There is a few areas of mild ischemic changes approximately 1 cm in diameter but there is no full-thickness skin loss.  Imaging: No results found. No images are attached to the encounter.  Labs: Lab Results  Component Value Date   HGBA1C 9.8 (H) 10/11/2017   HGBA1C 6.6 (H) 11/19/2016   HGBA1C >15.5 (H) 08/31/2016   ESRSEDRATE 92 (H) 10/10/2017   ESRSEDRATE 48 (H) 10/09/2016   ESRSEDRATE 56 (H) 08/30/2016   CRP 13.5 (H) 10/10/2017   CRP <0.8 10/09/2016   CRP 26.1 (H) 08/30/2016   REPTSTATUS 10/15/2017 FINAL 10/10/2017   GRAMSTAIN  10/09/2016    MODERATE WBC PRESENT, PREDOMINANTLY PMN NO ORGANISMS SEEN    CULT  10/10/2017    NO GROWTH 5 DAYS Performed at [facility], [place] [address] [place], [place] [address]    LABORGA PSEUDOMONAS AERUGINOSA 10/09/2016   LABORGA  KLEBSIELLA OXYTOCA 10/09/2016    @LABSALLVALUES (HGBA1)@  Body mass index is 23.02 kg/m.  Orders:  No orders of the defined types were placed in this encounter.  No orders of the defined types were placed in this encounter.    Procedures: No procedures performed  Clinical Data: No additional findings.  ROS:  All other systems negative, except as noted in the HPI. Review of Systems  Objective: Vital Signs: Ht 5\' 7"  (1.702 m)   Wt 147 lb (66.7 kg)   BMI 23.02 kg/m   Specialty Comments:  No specialty comments available.  PMFS History: Patient Active Problem List   Diagnosis Date Noted  . Hypertensive heart disease, benign, with CHF ([place]) 10/22/2017  . DM (diabetes mellitus), type 2 with peripheral vascular complications ([place]) 98/92/1194  . Dyslipidemia associated with type 2 diabetes mellitus ([place]) 10/22/2017  . Oral candidiasis 10/22/2017  . Chronic iron deficiency anemia 10/22/2017  . S/P AKA (above knee amputation), right ([place])   . Malnutrition of moderate degree 10/13/2017  . Goals of care, counseling/discussion   . Palliative care encounter   . Atherosclerosis of native arteries of extremities with gangrene, right leg ([place])   . Cellulitis 10/10/2017  . Arm wound, right, initial encounter 11/19/2016  . Soft tissue infection   . Gram-negative infection   . Severe protein-calorie malnutrition (  [place]) 09/02/2016  . Elevated troponin 08/31/2016  . Pressure injury of skin 08/31/2016  . Sepsis ([place]) 08/30/2016  . Chest pain 08/30/2016  . AKI (acute kidney injury) ([place]) 08/30/2016  . Chronic diastolic CHF (congestive heart failure) ([place]) 08/30/2016  . Septic olecranon bursitis of right elbow   . Diabetic autonomic neuropathy associated with type 2 diabetes mellitus ([place]) 03/11/2016  . Legal blindness 01/16/2016  . Essential hypertension   . Hypokalemia 04/12/2015  . 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 Hypomagnesemia   .  Bacteremia due to methicillin resistant Staphylococcus aureus 04/10/2015  . Atherosclerotic peripheral vascular disease ([place]) 04/10/2015  . Acute bronchitis 04/09/2015  . Hypertension 04/09/2015  . Hyperlipidemia 04/09/2015  . Alcohol abuse 04/09/2015  . Tobacco abuse disorder 04/09/2015  . Acute encephalopathy    Past Medical History:  Diagnosis Date  . Blind right eye    S/P shotgun  . Bursitis    right Olecranon  . Chronic bronchitis ([place])   . Diabetic neuropathy ([place])   . Hyperlipemia   . Hypertension   . 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 Type II diabetes mellitus ([place])   . Wound cellulitis    right elbow open    Family History  Problem Relation Age of Onset  . Diabetes Mellitus II Mother   . Diabetes Mellitus II Father   . Diabetes Mellitus II Sister   . Diabetes Mellitus II Sister     Past Surgical History:  Procedure Laterality Date  . AMPUTATION Right 10/16/2017   Procedure: RIGHT ABOVE KNEE AMPUTATION;  Surgeon: Newt Minion, MD;  Location: [place];  Service: Orthopedics;  Laterality: Right;  . APPLICATION OF A-CELL OF EXTREMITY Right 10/15/2016   Procedure: APPLICATION OF A-CELL OF EXTREMITY;  Surgeon: Loel Lofty Dillingham, DO;  Location: [place];  Service: Plastics;  Laterality: Right;  . APPLICATION OF A-CELL OF EXTREMITY Right 11/19/2016   Procedure: APPLICATION OF A-CELL OF EXTREMITY;  Surgeon: Loel Lofty Dillingham, DO;  Location: [place];  Service: Plastics;  Laterality: Right;  . APPLICATION OF A-CELL OF EXTREMITY Right 01/13/2017   Procedure: APPLICATION OF A-CELL OF EXTREMITY;  Surgeon: Wallace Going, DO;  Location: [place];  Service: Plastics;  Laterality: Right;  . APPLICATION OF WOUND VAC Right 10/09/2016   Procedure: APPLICATION OF WOUND VAC;  Surgeon: Leandrew Koyanagi, MD;  Location: [place];  Service: Orthopedics;  Laterality: Right;  . APPLICATION OF WOUND VAC Right 10/15/2016   Procedure: WOUND VAC CHANGE;  Surgeon: Loel Lofty  Dillingham, DO;  Location: [place];  Service: Plastics;  Laterality: Right;  . APPLICATION OF WOUND VAC Right 11/19/2016   Procedure: APPLICATION OF WOUND VAC;  Surgeon: Loel Lofty Dillingham, DO;  Location: [place];  Service: Plastics;  Laterality: Right;  . GRAFT APPLICATION Right 01/21/3015   Procedure: GRAFT APPLICATION  RIGHT UPPER ARM FROM DONOR SITE;  Surgeon: Wallace Going, DO;  Location: [place];  Service: Plastics;  Laterality: Right;  . I&D EXTREMITY Right 10/09/2016   Procedure: IRRIGATION AND DEBRIDEMENT EXTREMITY RIGHT ARM WOUND;  Surgeon: Leandrew Koyanagi, MD;  Location: [place];  Service: Orthopedics;  Laterality: Right;  . I&D EXTREMITY N/A 10/11/2016   Procedure: IRRIGATION AND DEBRIDEMENT EXTREMITY RIGHT ARM WOUND;  Surgeon: Leandrew Koyanagi, MD;  Location: [place];  Service: Orthopedics;  Laterality: N/A;  . I&D EXTREMITY Right 10/15/2016   Procedure: IRRIGATION AND DEBRIDEMENT EXTREMITY/RIGHT ELBOW;  Surgeon: Lyndee Leo  S Dillingham, DO;  Location: [place];  Service: Plastics;  Laterality: Right;  . INCISION AND DRAINAGE OF WOUND Right 10/09/2016   arm  . INCISION AND DRAINAGE OF WOUND Right 11/19/2016   Procedure: IRRIGATION AND DEBRIDEMENT WOUND;  Surgeon: Loel Lofty Dillingham, DO;  Location: [place];  Service: Plastics;  Laterality: Right;  . SKIN SPLIT GRAFT Right 01/13/2017   Procedure: SKIN GRAFT SPLIT THICKNESS;  Surgeon: Wallace Going, DO;  Location: [place];  Service: Plastics;  Laterality: Right;   Social History   Occupational History  . Not on file  Tobacco Use  . Smoking status: Current Every Day Smoker    Packs/day: 1.50    Years: 63.00    Pack years: 94.50    Types: Cigarettes  . Smokeless tobacco: Former Systems developer    Types: Chew  Substance and Sexual Activity  . Alcohol use: Yes    Comment: 01/12/2017 "nothing in 3 years"  . Drug use: No  . Sexual activity: Not on file

## 2017-11-02 ENCOUNTER — Non-Acute Institutional Stay (SKILLED_NURSING_FACILITY): Payer: Medicare Other | Admitting: Adult Health

## 2017-11-02 ENCOUNTER — Encounter: Payer: Self-pay | Admitting: Adult Health

## 2017-11-02 DIAGNOSIS — W19XXXA Unspecified fall, initial encounter: Secondary | ICD-10-CM | POA: Diagnosis not present

## 2017-11-02 DIAGNOSIS — E1151 Type 2 diabetes mellitus with diabetic peripheral angiopathy without gangrene: Secondary | ICD-10-CM

## 2017-11-02 DIAGNOSIS — I110 Hypertensive heart disease with heart failure: Secondary | ICD-10-CM

## 2017-11-02 DIAGNOSIS — Y92129 Unspecified place in nursing home as the place of occurrence of the external cause: Secondary | ICD-10-CM

## 2017-11-02 NOTE — Progress Notes (Addendum)
Location:   [facility] Room Number: [place] of Service:  SNF (31)   CODE STATUS: Full Code  Allergies  Allergen Reactions  . Penicillins Rash and Other (See Comments)    Tolerated Zosyn Jan 2018  PATIENT HAS HAD A PCN REACTION WITH IMMEDIATE RASH, FACIAL/TONGUE/THROAT SWELLING, SOB, OR LIGHTHEADEDNESS WITH HYPOTENSION:  #  #  #  YES  #  #  #   Has patient had a PCN reaction causing severe rash involving mucus membranes or skin necrosis:NO Has patient had a PCN reaction that required hospitalization NO Has patient had a PCN reaction occurring within the last 73 years:NO    Chief Complaint  Patient presents with  . Acute Visit    Med review due to fall    HPI:  He had a fall yesterday without injury. He tells me that he got too close to the edge of the bed and slid out. He denies any vertigo; chest pain; or altered consciousness. The nursing staff has asked me to review his medications.   His AM cbgs are under controlled; however the rest are all over 200. He will need further medication adjustment.   Past Medical History:  Diagnosis Date  . Blind right eye    S/P shotgun  . Bursitis    right Olecranon  . Chronic bronchitis ([place])   . Diabetic neuropathy ([place])   . Hyperlipemia   . Hypertension   . 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 Type II diabetes mellitus ([place])   . Wound cellulitis    right elbow open    Past Surgical History:  Procedure Laterality Date  . AMPUTATION Right 10/16/2017   Procedure: RIGHT ABOVE KNEE AMPUTATION;  Surgeon: Newt Minion, MD;  Location: [place];  Service: Orthopedics;  Laterality: Right;  . APPLICATION OF A-CELL OF EXTREMITY Right 10/15/2016   Procedure: APPLICATION OF A-CELL OF EXTREMITY;  Surgeon: Loel Lofty Dillingham, DO;  Location: [place];  Service: Plastics;  Laterality: Right;  . APPLICATION OF A-CELL OF EXTREMITY Right 11/19/2016   Procedure: APPLICATION OF A-CELL OF EXTREMITY;   Surgeon: Loel Lofty Dillingham, DO;  Location: [place];  Service: Plastics;  Laterality: Right;  . APPLICATION OF A-CELL OF EXTREMITY Right 01/13/2017   Procedure: APPLICATION OF A-CELL OF EXTREMITY;  Surgeon: Wallace Going, DO;  Location: [place];  Service: Plastics;  Laterality: Right;  . APPLICATION OF WOUND VAC Right 10/09/2016   Procedure: APPLICATION OF WOUND VAC;  Surgeon: Leandrew Koyanagi, MD;  Location: [facility];  Service: Orthopedics;  Laterality: Right;  . APPLICATION OF WOUND VAC Right 10/15/2016   Procedure: WOUND VAC CHANGE;  Surgeon: Loel Lofty Dillingham, DO;  Location: [place];  Service: Plastics;  Laterality: Right;  . APPLICATION OF WOUND VAC Right 11/19/2016   Procedure: APPLICATION OF WOUND VAC;  Surgeon: Loel Lofty Dillingham, DO;  Location: [place];  Service: Plastics;  Laterality: Right;  . GRAFT APPLICATION Right 2/58/3462   Procedure: GRAFT APPLICATION  RIGHT UPPER ARM FROM DONOR SITE;  Surgeon: Wallace Going, DO;  Location: [facility];  Service: Plastics;  Laterality: Right;  . I&D EXTREMITY Right 10/09/2016   Procedure: IRRIGATION AND DEBRIDEMENT EXTREMITY RIGHT ARM WOUND;  Surgeon: Leandrew Koyanagi, MD;  Location: [facility];  Service: Orthopedics;  Laterality: Right;  . I&D EXTREMITY N/A 10/11/2016   Procedure: IRRIGATION AND DEBRIDEMENT EXTREMITY RIGHT ARM WOUND;  Surgeon: Leandrew Koyanagi, MD;  Location: [facility];  Service: Orthopedics;  Laterality: N/A;  . I&D EXTREMITY Right 10/15/2016   Procedure: IRRIGATION AND DEBRIDEMENT EXTREMITY/RIGHT ELBOW;  Surgeon: Loel Lofty Dillingham, DO;  Location: [place];  Service: Plastics;  Laterality: Right;  . INCISION AND DRAINAGE OF WOUND Right 10/09/2016   arm  . INCISION AND DRAINAGE OF WOUND Right 11/19/2016   Procedure: IRRIGATION AND DEBRIDEMENT WOUND;  Surgeon: Loel Lofty Dillingham, DO;  Location: [place];  Service: Plastics;  Laterality: Right;  . SKIN SPLIT GRAFT Right 01/13/2017   Procedure: SKIN GRAFT SPLIT THICKNESS;  Surgeon: Wallace Going, DO;   Location: [place];  Service: Plastics;  Laterality: Right;    Social History   Socioeconomic History  . Marital status: Widowed    Spouse name: Not on file  . Number of children: Not on file  . Years of education: Not on file  . Highest education level: Not on file  Social Needs  . Financial resource strain: Not on file  . Food insecurity - worry: Not on file  . Food insecurity - inability: Not on file  . Transportation needs - medical: Not on file  . Transportation needs - non-medical: Not on file  Occupational History  . Not on file  Tobacco Use  . Smoking status: Current Every Day Smoker    Packs/day: 1.50    Years: 63.00    Pack years: 94.50    Types: Cigarettes  . Smokeless tobacco: Former Systems developer    Types: Chew  Substance and Sexual Activity  . Alcohol use: Yes    Comment: 01/12/2017 "nothing in 3 years"  . Drug use: No  . Sexual activity: Not on file  Other Topics Concern  . Not on file  Social History Narrative  . Not on file   Family History  Problem Relation Age of Onset  . Diabetes Mellitus II Mother   . Diabetes Mellitus II Father   . Diabetes Mellitus II Sister   . Diabetes Mellitus II Sister       VITAL SIGNS BP (!) 160/78   Pulse 75   Temp 98.7 F (37.1 C)   Resp 18   Ht _0  (1.702 m)   Wt 147 lb (66.7 kg)   BMI 23.02 kg/m   Outpatient Encounter Medications as of 11/02/2017  Medication Sig  . amLODipine (NORVASC) 5 MG tablet Take 1 tablet (5 mg total) by mouth daily.  Marland Kitchen aspirin EC 325 MG tablet Take 325 mg by mouth daily.  Marland Kitchen atorvastatin (LIPITOR) 10 MG tablet Take 1 tablet (10 mg total) by mouth daily at 6 PM.  . docusate sodium (COLACE) 100 MG capsule Take 1 capsule (100 mg total) by mouth 2 (two) times daily.  Marland Kitchen ENSURE (ENSURE) Take 237 mLs by mouth daily.  . ferrous sulfate 325 (65 FE) MG tablet Take 1 tablet (325 mg total) by mouth 2 (two) times daily with a meal.  . gabapentin (NEURONTIN) 100 MG capsule Take 100 mg by mouth 3 (three)  times daily.  Marland Kitchen HYDROcodone-acetaminophen (NORCO/VICODIN) 5-325 MG tablet Take 1-2 tablets by mouth every 4 (four) hours as needed for moderate pain (pain score 4-6).  Marland Kitchen insulin aspart (NOVOLOG PENFILL) cartridge Inject as per sliding scale: 0 - 149 = 0 units 150 - 400 = 5 units Call provider is less than or equal to 60 or greater than 400  . insulin glargine (LANTUS) 100 UNIT/ML injection Inject 0.2 mLs (20 Units total) into the skin daily.  . metoprolol succinate (TOPROL-XL) 50  MG 24 hr tablet Take 50 mg by mouth daily.  . Multiple Vitamin (MULTIVITAMIN WITH MINERALS) TABS tablet Take 1 tablet by mouth daily.  . nicotine (NICODERM CQ - DOSED IN MG/24 HOURS) 21 mg/24hr patch Place 1 patch (21 mg total) onto the skin at bedtime.  Marland Kitchen nystatin (MYCOSTATIN) 100000 UNIT/ML suspension Take 5 mLs (500,000 Units total) by mouth 4 (four) times daily.  . polyethylene glycol (MIRALAX / GLYCOLAX) packet Take 17 g by mouth daily.  Marland Kitchen senna-docusate (SENOKOT-S) 8.6-50 MG tablet Take 1 tablet by mouth 2 (two) times daily.  . sitaGLIPtin (JANUVIA) 100 MG tablet Take 100 mg by mouth daily.  Marland Kitchen sulfamethoxazole-trimethoprim (BACTRIM DS,SEPTRA DS) 800-160 MG tablet Take 1 tablet by mouth 2 (two) times daily.  Marland Kitchen thiamine 100 MG tablet Take 1 tablet (100 mg total) by mouth daily.   No facility-administered encounter medications on file as of 11/02/2017.      SIGNIFICANT DIAGNOSTIC EXAMS  PREVIOUS:   10-10-17: right foot x-ray:  1. Ulcerative lesion at the right lateral malleolus with soft tissue emphysema tracking superiorly within the anterior and lateral leg to the level of the mid tibia. Findings are consistent with gangrenous infection. In the appropriate context, the findings would be compatible with a clinical diagnosis of necrotizing fasciitis. 2. No radiographic evidence of active infectious osteitis.  10-10-17: right tibia/fibula x-ray:  1. Ulcerative lesion at the right lateral malleolus with soft  tissue emphysema tracking superiorly within the anterior and lateral leg to the level of the mid tibia. Findings are consistent with gangrenous infection. In the appropriate context, the findings would be compatible with a clinical diagnosis of necrotizing fasciitis. 2. No radiographic evidence of active infectious osteitis.   10-16-17: ultrasound scrotum doppler: 1. The right testicle is mildly heterogeneous in echotexture with no focal mass. The mild heterogeneity is nonspecific. No evidence of abscess. Normal blood flow identified in the right testicle. 2. The left testicle is normal in appearance. 3. Normal bilateral testicular blood flow. No evidence of torsion on this study. 4. Scrotal skin thickening consistent with history.   NO NEW EXAMS   LABS REVIEWED: PREVIOUS:   10-10-17: wbc 34.6; hgb 11.3; hct 33.6; mcv 83.2; plt 670; glucose 295; bun 14; creat 0.79; k+ 4.5; na++ 129; ca 8.8; ast 50; albumin 2.4; blood culture: no growth; CRP 13.5; sed rate 92 10-11-17: hgb a1c 9.8; chol 72; ldl 38; trig 78; hdl 18; pre-albumin <5 10-13-17: wbc 26.9; hgb 10.0; hct 30.1; mcv 84.8; plt 737; glucose 159; bun 7; creat 0.49; k+ 3.5; na++ 132; ca 8.3 10-14-17: wbc 24.8; hgb 9.9; hct 30.0; mcv 83.6; plt 752; glucose 231; bun 9; creat 0.54; k+ 3.7; na++ 133; ca 8.5; ast 16; alt 26; alk phos 142; albumin 1.9; ammonia 36 10-16-17: wbc 16.0; hgb 8.2; hct 25.6; mcv 85.6; plt 7.32; k+ 134; bun 8; creat 0.58; k+ 4.0; na++ 133; ca 8.2 10-18-17: wbc 14.0; hgb 8.5; hct 26.5; mcv 84.7;; plt 779; vit B 12: 463; folate 18.9; iron 16; tibc 118; ferritin 312 10-21-17: wbc 13.0; hgb 8.7; hct 26.7; mcv 85.9; plt 762  NO NEW LABS.     Review of Systems  Reason unable to perform ROS: poor historian   Constitutional: Negative for malaise/fatigue.  Respiratory: Negative for cough and shortness of breath.   Cardiovascular: Negative for chest pain and leg swelling.  Gastrointestinal: Negative for abdominal pain and constipation.    Musculoskeletal: Negative for back pain, joint pain and myalgias.  Skin: Negative.  Neurological: Negative for dizziness.  Psychiatric/Behavioral: The patient is not nervous/anxious.     Physical Exam  Constitutional: No distress.  Thin   Eyes:  Blind right eye   Neck: No thyromegaly present.  Cardiovascular: Normal rate, regular rhythm and intact distal pulses.  Murmur heard. 1/6  Abdominal: Soft. Bowel sounds are normal.  Musculoskeletal: He exhibits no edema.  Is status post right aka Status post right thumb amputation Is able to move all extremities    Lymphadenopathy:    He has no cervical adenopathy.  Neurological: He is alert.  Skin: Skin is warm and dry. He is not diaphoretic.  Psychiatric: He has a normal mood and affect.     ASSESSMENT/ PLAN:  TODAY:   1. DM type 2 with peripheral vascular complications: without change hgb a1c 9.8  2. Hypertensive heart disease, benign, with CHF 3. Fall at nursing home initial encounter  Will increase norvasc to 10 mg daily  Will increase novolog to 6 units breakfast; 8 units with lunch and supper Will check b/p twice daily and will report on 11-08-17.      MD is aware of resident's narcotic use and is in agreement with current plan of care. We will attempt to wean resident as apropriate    Ok Edwards NP [facility]  Contact [phone] Monday through Friday 8am- 5pm  After hours call [phone]

## 2017-11-11 ENCOUNTER — Encounter (INDEPENDENT_AMBULATORY_CARE_PROVIDER_SITE_OTHER): Payer: Self-pay | Admitting: Orthopedic Surgery

## 2017-11-11 ENCOUNTER — Ambulatory Visit (INDEPENDENT_AMBULATORY_CARE_PROVIDER_SITE_OTHER): Payer: Medicare Other | Admitting: Orthopedic Surgery

## 2017-11-11 VITALS — Ht 67.0 in | Wt 147.0 lb

## 2017-11-11 DIAGNOSIS — Z89611 Acquired absence of right leg above knee: Secondary | ICD-10-CM

## 2017-11-11 NOTE — Progress Notes (Signed)
Office Visit Note   Patient: Andrew [name]           Date of Birth: 10-29-1945           MRN: [id number] Visit Date: 11/11/2017              Requested by: Christain Sacramento, [address] [place], [place] [address] PCP: Christain Sacramento, MD  Chief Complaint  Patient presents with  . Right Leg - Routine Post Op    10/16/17 right AKA      HPI: Patient is a 72 year old gentleman follows up status post right above-knee amputation.  Patient is currently in skilled nursing.  Assessment & Plan: Visit Diagnoses:  1. Amputee, above knee, right ([place])     Plan: Staples and sutures harvested today patient is to work with physical therapy therapy will determine if patient is a candidate for prosthetic fitting.  Prescription written for a stump shrinker.  Follow-Up Instructions: Return in about 1 month (around 12/09/2017).   Ortho Exam  Patient is alert, oriented, no adenopathy, well-dressed, normal affect, normal respiratory effort. Examination patient's incision is well-healed there is one small area of 5 mm of hyper granulation tissue this was touched with silver nitrate.  There is no drainage no odor no cellulitis no signs of infection.  Imaging: No results found. No images are attached to the encounter.  Labs: Lab Results  Component Value Date   HGBA1C 9.8 (H) 10/11/2017   HGBA1C 6.6 (H) 11/19/2016   HGBA1C >15.5 (H) 08/31/2016   ESRSEDRATE 95 10/26/2017   ESRSEDRATE 92 (H) 10/10/2017   ESRSEDRATE 48 (H) 10/09/2016   CRP 13.5 (H) 10/10/2017   CRP <0.8 10/09/2016   CRP 26.1 (H) 08/30/2016   REPTSTATUS 10/15/2017 FINAL 10/10/2017   GRAMSTAIN  10/09/2016    MODERATE WBC PRESENT, PREDOMINANTLY PMN NO ORGANISMS SEEN    CULT  10/10/2017    NO GROWTH 5 DAYS Performed at [facility], [place] [address], [place], [place] [address]    [place] 10/09/2016   LABORGA KLEBSIELLA OXYTOCA 10/09/2016    @LABSALLVALUES (HGBA1)@  Body mass index is  23.02 kg/m.  Orders:  No orders of the defined types were placed in this encounter.  No orders of the defined types were placed in this encounter.    Procedures: No procedures performed  Clinical Data: No additional findings.  ROS:  All other systems negative, except as noted in the HPI. Review of Systems  Objective: Vital Signs: Ht 5\' 7"  (1.702 m)   Wt 147 lb (66.7 kg)   BMI 23.02 kg/m   Specialty Comments:  No specialty comments available.  PMFS History: Patient Active Problem List   Diagnosis Date Noted  . Fall at nursing home 11/02/2017  . Hypertensive heart disease, benign, with CHF ([place]) 10/22/2017  . DM (diabetes mellitus), type 2 with peripheral vascular complications ([place]) 32/44/0102  . Dyslipidemia associated with type 2 diabetes mellitus ([place]) 10/22/2017  . Oral candidiasis 10/22/2017  . Chronic iron deficiency anemia 10/22/2017  . S/P AKA (above knee amputation), right ([place])   . Malnutrition of moderate degree 10/13/2017  . Goals of care, counseling/discussion   . Palliative care encounter   . Atherosclerosis of native arteries of extremities with gangrene, right leg ([place])   . Cellulitis 10/10/2017  . Arm wound, right, initial encounter 11/19/2016  . Soft tissue infection   . Gram-negative infection   . Severe protein-calorie malnutrition ([place]) 09/02/2016  . Elevated  troponin 08/31/2016  . Pressure injury of skin 08/31/2016  . Sepsis ([place]) 08/30/2016  . Chest pain 08/30/2016  . AKI (acute kidney injury) ([place]) 08/30/2016  . Chronic diastolic CHF (congestive heart failure) ([place]) 08/30/2016  . Septic olecranon bursitis of right elbow   . Diabetic autonomic neuropathy associated with type 2 diabetes mellitus ([place]) 03/11/2016  . Legal blindness 01/16/2016  . Essential hypertension   . Hypokalemia 04/12/2015  . 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 Hypomagnesemia   . Bacteremia due to methicillin resistant  Staphylococcus aureus 04/10/2015  . Atherosclerotic peripheral vascular disease ([place]) 04/10/2015  . Acute bronchitis 04/09/2015  . Hypertension 04/09/2015  . Hyperlipidemia 04/09/2015  . Alcohol abuse 04/09/2015  . Tobacco abuse disorder 04/09/2015  . Acute encephalopathy    Past Medical History:  Diagnosis Date  . Blind right eye    S/P shotgun  . Bursitis    right Olecranon  . Chronic bronchitis ([place])   . Diabetic neuropathy ([place])   . Hyperlipemia   . Hypertension   . 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 Type II diabetes mellitus ([place])   . Wound cellulitis    right elbow open    Family History  Problem Relation Age of Onset  . Diabetes Mellitus II Mother   . Diabetes Mellitus II Father   . Diabetes Mellitus II Sister   . Diabetes Mellitus II Sister     Past Surgical History:  Procedure Laterality Date  . AMPUTATION Right 10/16/2017   Procedure: RIGHT ABOVE KNEE AMPUTATION;  Surgeon: Newt Minion, MD;  Location: [facility];  Service: Orthopedics;  Laterality: Right;  . APPLICATION OF A-CELL OF EXTREMITY Right 10/15/2016   Procedure: APPLICATION OF A-CELL OF EXTREMITY;  Surgeon: Loel Lofty Dillingham, DO;  Location: [facility];  Service: Plastics;  Laterality: Right;  . APPLICATION OF A-CELL OF EXTREMITY Right 11/19/2016   Procedure: APPLICATION OF A-CELL OF EXTREMITY;  Surgeon: Loel Lofty Dillingham, DO;  Location: [facility];  Service: Plastics;  Laterality: Right;  . APPLICATION OF A-CELL OF EXTREMITY Right 01/13/2017   Procedure: APPLICATION OF A-CELL OF EXTREMITY;  Surgeon: Wallace Going, DO;  Location: [facility];  Service: Plastics;  Laterality: Right;  . APPLICATION OF WOUND VAC Right 10/09/2016   Procedure: APPLICATION OF WOUND VAC;  Surgeon: Leandrew Koyanagi, MD;  Location: [facility];  Service: Orthopedics;  Laterality: Right;  . APPLICATION OF WOUND VAC Right 10/15/2016   Procedure: WOUND VAC CHANGE;  Surgeon: Loel Lofty Dillingham, DO;  Location: [facility];   Service: Plastics;  Laterality: Right;  . APPLICATION OF WOUND VAC Right 11/19/2016   Procedure: APPLICATION OF WOUND VAC;  Surgeon: Loel Lofty Dillingham, DO;  Location: [facility];  Service: Plastics;  Laterality: Right;  . GRAFT APPLICATION Right 11/05/252   Procedure: GRAFT APPLICATION  RIGHT UPPER ARM FROM DONOR SITE;  Surgeon: Wallace Going, DO;  Location: [place];  Service: Plastics;  Laterality: Right;  . I&D EXTREMITY Right 10/09/2016   Procedure: IRRIGATION AND DEBRIDEMENT EXTREMITY RIGHT ARM WOUND;  Surgeon: Leandrew Koyanagi, MD;  Location: [facility];  Service: Orthopedics;  Laterality: Right;  . I&D EXTREMITY N/A 10/11/2016   Procedure: IRRIGATION AND DEBRIDEMENT EXTREMITY RIGHT ARM WOUND;  Surgeon: Leandrew Koyanagi, MD;  Location: [place];  Service: Orthopedics;  Laterality: N/A;  . I&D EXTREMITY Right 10/15/2016   Procedure: IRRIGATION AND DEBRIDEMENT EXTREMITY/RIGHT ELBOW;  Surgeon: Loel Lofty Dillingham, DO;  Location:  [facility];  Service: Plastics;  Laterality: Right;  . INCISION AND DRAINAGE OF WOUND Right 10/09/2016   arm  . INCISION AND DRAINAGE OF WOUND Right 11/19/2016   Procedure: IRRIGATION AND DEBRIDEMENT WOUND;  Surgeon: Loel Lofty Dillingham, DO;  Location: [facility];  Service: Plastics;  Laterality: Right;  . SKIN SPLIT GRAFT Right 01/13/2017   Procedure: SKIN GRAFT SPLIT THICKNESS;  Surgeon: Wallace Going, DO;  Location: [facility];  Service: Plastics;  Laterality: Right;   Social History   Occupational History  . Not on file  Tobacco Use  . Smoking status: Current Every Day Smoker    Packs/day: 1.50    Years: 63.00    Pack years: 94.50    Types: Cigarettes  . Smokeless tobacco: Former Systems developer    Types: Chew  Substance and Sexual Activity  . Alcohol use: Yes    Comment: 01/12/2017 "nothing in 3 years"  . Drug use: No  . Sexual activity: Not on file

## 2017-11-15 ENCOUNTER — Non-Acute Institutional Stay (SKILLED_NURSING_FACILITY): Payer: Medicare Other | Admitting: Adult Health

## 2017-11-15 ENCOUNTER — Encounter: Payer: Self-pay | Admitting: Adult Health

## 2017-11-15 DIAGNOSIS — T8131XA Disruption of external operation (surgical) wound, not elsewhere classified, initial encounter: Secondary | ICD-10-CM | POA: Diagnosis not present

## 2017-11-15 NOTE — Progress Notes (Signed)
Location:   [facility] Room Number: 226 A Place of Service:  SNF (31)   CODE STATUS: Full Code  Allergies  Allergen Reactions  . Penicillins Rash and Other (See Comments)    Tolerated Zosyn Jan 2018  PATIENT HAS HAD A PCN REACTION WITH IMMEDIATE RASH, FACIAL/TONGUE/THROAT SWELLING, SOB, OR LIGHTHEADEDNESS WITH HYPOTENSION:  #  #  #  YES  #  #  #   Has patient had a PCN reaction causing severe rash involving mucus membranes or skin necrosis:NO Has patient had a PCN reaction that required hospitalization NO Has patient had a PCN reaction occurring within the last 28 years:NO    Chief Complaint  Patient presents with  . Acute Visit    Wound care - right stump    HPI:  He had seen Dr. Sharol Given last week for his right aka incision line. He had sutures removed. Staff reports that there is dehiscence on the lateral side of the incision line. He denies pain; there are no reports of fever; there is mild purulent drainage present.   Past Medical History:  Diagnosis Date  . Blind right eye    S/P shotgun  . Bursitis    right Olecranon  . Chronic bronchitis ([place])   . Diabetic neuropathy ([place])   . Hyperlipemia   . Hypertension   . 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 Type II diabetes mellitus ([place])   . Wound cellulitis    right elbow open    Past Surgical History:  Procedure Laterality Date  . AMPUTATION Right 10/16/2017   Procedure: RIGHT ABOVE KNEE AMPUTATION;  Surgeon: Newt Minion, MD;  Location: [place];  Service: Orthopedics;  Laterality: Right;  . APPLICATION OF A-CELL OF EXTREMITY Right 10/15/2016   Procedure: APPLICATION OF A-CELL OF EXTREMITY;  Surgeon: Loel Lofty Dillingham, DO;  Location: [place];  Service: Plastics;  Laterality: Right;  . APPLICATION OF A-CELL OF EXTREMITY Right 11/19/2016   Procedure: APPLICATION OF A-CELL OF EXTREMITY;  Surgeon: Loel Lofty Dillingham, DO;  Location: [place];  Service: Plastics;  Laterality:  Right;  . APPLICATION OF A-CELL OF EXTREMITY Right 01/13/2017   Procedure: APPLICATION OF A-CELL OF EXTREMITY;  Surgeon: Wallace Going, DO;  Location: [place];  Service: Plastics;  Laterality: Right;  . APPLICATION OF WOUND VAC Right 10/09/2016   Procedure: APPLICATION OF WOUND VAC;  Surgeon: Leandrew Koyanagi, MD;  Location: [place];  Service: Orthopedics;  Laterality: Right;  . APPLICATION OF WOUND VAC Right 10/15/2016   Procedure: WOUND VAC CHANGE;  Surgeon: Loel Lofty Dillingham, DO;  Location: [place];  Service: Plastics;  Laterality: Right;  . APPLICATION OF WOUND VAC Right 11/19/2016   Procedure: APPLICATION OF WOUND VAC;  Surgeon: Loel Lofty Dillingham, DO;  Location: [place];  Service: Plastics;  Laterality: Right;  . GRAFT APPLICATION Right 4/70/9628   Procedure: GRAFT APPLICATION  RIGHT UPPER ARM FROM DONOR SITE;  Surgeon: Wallace Going, DO;  Location: [place];  Service: Plastics;  Laterality: Right;  . I&D EXTREMITY Right 10/09/2016   Procedure: IRRIGATION AND DEBRIDEMENT EXTREMITY RIGHT ARM WOUND;  Surgeon: Leandrew Koyanagi, MD;  Location: [place];  Service: Orthopedics;  Laterality: Right;  . I&D EXTREMITY N/A 10/11/2016   Procedure: IRRIGATION AND DEBRIDEMENT EXTREMITY RIGHT ARM WOUND;  Surgeon: Leandrew Koyanagi, MD;  Location: [place];  Service: Orthopedics;  Laterality: N/A;  . I&D EXTREMITY Right 10/15/2016   Procedure: IRRIGATION AND DEBRIDEMENT  EXTREMITY/RIGHT ELBOW;  Surgeon: Loel Lofty Dillingham, DO;  Location: [place];  Service: Plastics;  Laterality: Right;  . INCISION AND DRAINAGE OF WOUND Right 10/09/2016   arm  . INCISION AND DRAINAGE OF WOUND Right 11/19/2016   Procedure: IRRIGATION AND DEBRIDEMENT WOUND;  Surgeon: Loel Lofty Dillingham, DO;  Location: [place];  Service: Plastics;  Laterality: Right;  . SKIN SPLIT GRAFT Right 01/13/2017   Procedure: SKIN GRAFT SPLIT THICKNESS;  Surgeon: Wallace Going, DO;  Location: [place];  Service: Plastics;  Laterality: Right;    Social History    Socioeconomic History  . Marital status: Widowed    Spouse name: Not on file  . Number of children: Not on file  . Years of education: Not on file  . Highest education level: Not on file  Occupational History  . Not on file  Social Needs  . Financial resource strain: Not on file  . Food insecurity:    Worry: Not on file    Inability: Not on file  . Transportation needs:    Medical: Not on file    Non-medical: Not on file  Tobacco Use  . Smoking status: Current Every Day Smoker    Packs/day: 1.50    Years: 63.00    Pack years: 94.50    Types: Cigarettes  . Smokeless tobacco: Former Systems developer    Types: Chew  Substance and Sexual Activity  . Alcohol use: Yes    Comment: 01/12/2017 "nothing in 3 years"  . Drug use: No  . Sexual activity: Not on file  Lifestyle  . Physical activity:    Days per week: Not on file    Minutes per session: Not on file  . Stress: Not on file  Relationships  . Social connections:    Talks on phone: Not on file    Gets together: Not on file    Attends religious service: Not on file    Active member of club or organization: Not on file    Attends meetings of clubs or organizations: Not on file    Relationship status: Not on file  . Intimate partner violence:    Fear of current or ex partner: Not on file    Emotionally abused: Not on file    Physically abused: Not on file    Forced sexual activity: Not on file  Other Topics Concern  . Not on file  Social History Narrative  . Not on file   Family History  Problem Relation Age of Onset  . Diabetes Mellitus II Mother   . Diabetes Mellitus II Father   . Diabetes Mellitus II Sister   . Diabetes Mellitus II Sister       VITAL SIGNS BP 132/66   Pulse 70   Temp (!) 97.5 F (36.4 C)   Resp 18   Ht 5' 7"  (1.702 m)   Wt 138 lb 6.4 oz (62.8 kg)   SpO2 96%   BMI 21.68 kg/m   Outpatient Encounter Medications as of 11/15/2017  Medication Sig  . amLODipine (NORVASC) 10 MG tablet Take 10 mg  by mouth daily.  Marland Kitchen aspirin EC 325 MG tablet Take 325 mg by mouth daily.  Marland Kitchen atorvastatin (LIPITOR) 10 MG tablet Take 1 tablet (10 mg total) by mouth daily at 6 PM.  . docusate sodium (COLACE) 100 MG capsule Take 1 capsule (100 mg total) by mouth 2 (two) times daily.  Marland Kitchen ENSURE (ENSURE) Take 237 mLs by mouth daily.  . ferrous  sulfate 325 (65 FE) MG tablet Take 325 mg by mouth daily with breakfast.  . gabapentin (NEURONTIN) 100 MG capsule Take 100 mg by mouth 3 (three) times daily.  Marland Kitchen HYDROcodone-acetaminophen (NORCO/VICODIN) 5-325 MG tablet Take 1-2 tablets by mouth every 4 (four) hours as needed for moderate pain (pain score 4-6).  Marland Kitchen insulin aspart (NOVOLOG FLEXPEN) 100 UNIT/ML FlexPen Inject 6 Units into the skin daily after breakfast. Plus sliding scale  . insulin aspart (NOVOLOG FLEXPEN) 100 UNIT/ML FlexPen Inject 8 Units into the skin daily with supper. Plus sliding scale  . insulin aspart (NOVOLOG FLEXPEN) 100 UNIT/ML FlexPen Inject 8 Units into the skin daily with lunch. Plus sliding scale  . insulin aspart (NOVOLOG PENFILL) cartridge Inject as per sliding scale: 0 - 149 = 0 units 150 - 400 = 5 units Call provider is less than or equal to 60 or greater than 400  . insulin glargine (LANTUS) 100 UNIT/ML injection Inject 0.2 mLs (20 Units total) into the skin daily.  . metoprolol succinate (TOPROL-XL) 50 MG 24 hr tablet Take 50 mg by mouth daily.  . Multiple Vitamin (MULTIVITAMIN WITH MINERALS) TABS tablet Take 1 tablet by mouth daily.  Marland Kitchen nystatin (MYCOSTATIN) 100000 UNIT/ML suspension Take 5 mLs (500,000 Units total) by mouth 4 (four) times daily.  . polyethylene glycol (MIRALAX / GLYCOLAX) packet Take 17 g by mouth daily.  Marland Kitchen senna-docusate (SENOKOT-S) 8.6-50 MG tablet Take 1 tablet by mouth 2 (two) times daily.  . sitaGLIPtin (JANUVIA) 100 MG tablet Take 100 mg by mouth daily.  Marland Kitchen thiamine 100 MG tablet Take 1 tablet (100 mg total) by mouth daily.  . [DISCONTINUED] amLODipine (NORVASC) 5 MG  tablet Take 1 tablet (5 mg total) by mouth daily. (Patient not taking: Reported on 11/15/2017)  . [DISCONTINUED] ferrous sulfate 325 (65 FE) MG tablet Take 1 tablet (325 mg total) by mouth 2 (two) times daily with a meal. (Patient not taking: Reported on 11/15/2017)  . [DISCONTINUED] nicotine (NICODERM CQ - DOSED IN MG/24 HOURS) 21 mg/24hr patch Place 1 patch (21 mg total) onto the skin at bedtime. (Patient not taking: Reported on 11/15/2017)   No facility-administered encounter medications on file as of 11/15/2017.      SIGNIFICANT DIAGNOSTIC EXAMS  PREVIOUS:   10-10-17: right foot x-ray:  1. Ulcerative lesion at the right lateral malleolus with soft tissue emphysema tracking superiorly within the anterior and lateral leg to the level of the mid tibia. Findings are consistent with gangrenous infection. In the appropriate context, the findings would be compatible with a clinical diagnosis of necrotizing fasciitis. 2. No radiographic evidence of active infectious osteitis.  10-10-17: right tibia/fibula x-ray:  1. Ulcerative lesion at the right lateral malleolus with soft tissue emphysema tracking superiorly within the anterior and lateral leg to the level of the mid tibia. Findings are consistent with gangrenous infection. In the appropriate context, the findings would be compatible with a clinical diagnosis of necrotizing fasciitis. 2. No radiographic evidence of active infectious osteitis.   10-16-17: ultrasound scrotum doppler: 1. The right testicle is mildly heterogeneous in echotexture with no focal mass. The mild heterogeneity is nonspecific. No evidence of abscess. Normal blood flow identified in the right testicle. 2. The left testicle is normal in appearance. 3. Normal bilateral testicular blood flow. No evidence of torsion on this study. 4. Scrotal skin thickening consistent with history.   NO NEW EXAMS   LABS REVIEWED: PREVIOUS:   10-10-17: wbc 34.6; hgb 11.3; hct 33.6; mcv 83.2; plt  670;  glucose 295; bun 14; creat 0.79; k+ 4.5; na++ 129; ca 8.8; ast 50; albumin 2.4; blood culture: no growth; CRP 13.5; sed rate 92 10-11-17: hgb a1c 9.8; chol 72; ldl 38; trig 78; hdl 18; pre-albumin <5 10-13-17: wbc 26.9; hgb 10.0; hct 30.1; mcv 84.8; plt 737; glucose 159; bun 7; creat 0.49; k+ 3.5; na++ 132; ca 8.3 10-14-17: wbc 24.8; hgb 9.9; hct 30.0; mcv 83.6; plt 752; glucose 231; bun 9; creat 0.54; k+ 3.7; na++ 133; ca 8.5; ast 16; alt 26; alk phos 142; albumin 1.9; ammonia 36 10-16-17: wbc 16.0; hgb 8.2; hct 25.6; mcv 85.6; plt 7.32; k+ 134; bun 8; creat 0.58; k+ 4.0; na++ 133; ca 8.2 10-18-17: wbc 14.0; hgb 8.5; hct 26.5; mcv 84.7;; plt 779; vit B 12: 463; folate 18.9; iron 16; tibc 118; ferritin 312 10-21-17: wbc 13.0; hgb 8.7; hct 26.7; mcv 85.9; plt 762  NO NEW LABS.     Review of Systems  Constitutional: Negative for malaise/fatigue.  Respiratory: Negative for cough and shortness of breath.   Cardiovascular: Negative for chest pain, palpitations and leg swelling.  Gastrointestinal: Negative for abdominal pain, constipation and heartburn.  Musculoskeletal: Negative for back pain, joint pain and myalgias.  Skin:       Right stump incision line   Neurological: Negative for dizziness.  Psychiatric/Behavioral: The patient is not nervous/anxious.     Physical Exam  Constitutional: No distress.  Thin   Eyes:  Blind right eye   Neck: No thyromegaly present.  Cardiovascular: Normal rate, regular rhythm and intact distal pulses.  Murmur heard. 1/6  Pulmonary/Chest: Effort normal and breath sounds normal. No respiratory distress.  Abdominal: Soft. Bowel sounds are normal. He exhibits no distension. There is no tenderness.  Musculoskeletal: He exhibits no edema.  Is status post right aka Status post right thumb amputation Is able to move all extremities   Lymphadenopathy:    He has no cervical adenopathy.  Neurological: He is alert.  Skin: Skin is warm and dry. He is not diaphoretic.    Right stump lateral incision line with dehiscence with slough present and purulent drainage present. The periwound is not inflamed.    Psychiatric: He has a normal mood and affect.     ASSESSMENT/ PLAN:  TODAY:   1.  Right stump surgical wound dehiscence: will have him return to Dr. Sharol Given ASAP. Will clean wound; use santyl and cover with dressing. Will begin doxycycline 100 mg twice daily for 2 weeks with probiotic and will monitor his status.      MD is aware of resident's narcotic use and is in agreement with current plan of care. We will attempt to wean resident as apropriate    Ok Edwards NP [facility]  Contact [phone] Monday through Friday 8am- 5pm  After hours call [phone]

## 2017-11-16 ENCOUNTER — Ambulatory Visit (INDEPENDENT_AMBULATORY_CARE_PROVIDER_SITE_OTHER): Payer: Medicare Other | Admitting: Orthopedic Surgery

## 2017-11-16 ENCOUNTER — Encounter (INDEPENDENT_AMBULATORY_CARE_PROVIDER_SITE_OTHER): Payer: Self-pay | Admitting: Orthopedic Surgery

## 2017-11-16 VITALS — Ht 67.0 in | Wt 138.0 lb

## 2017-11-16 DIAGNOSIS — T8781 Dehiscence of amputation stump: Secondary | ICD-10-CM

## 2017-11-16 DIAGNOSIS — Z89611 Acquired absence of right leg above knee: Secondary | ICD-10-CM | POA: Insufficient documentation

## 2017-11-16 NOTE — Progress Notes (Signed)
Office Visit Note   Patient: Andrew [name]           Date of Birth: 1945-11-09           MRN: [id number] Visit Date: 11/16/2017              Requested by: Christain Sacramento, [address] [place], [place] [address] PCP: Christain Sacramento, MD  Chief Complaint  Patient presents with  . Right Leg - Routine Post Op    10/16/17 right BKA      HPI: Patient is a 72 year old gentleman who presents 1 month status post right above-the-knee amputation.  Patient has been on doxycycline for 2 weeks and has been on probiotics.  Patient has had a Band-Aid over the incision.  He is currently at skilled nursing at [facility].  Assessment & Plan: Visit Diagnoses:  1. Amputee, above knee, right ([place])   2. Dehiscence of amputation stump ([place])     Plan: Orders were written for the area of dehiscence to be washed with soap and water daily packed open with gauze and a Band-Aid applied.  Follow-Up Instructions: Return in about 1 month (around 12/14/2017).   Ortho Exam  Patient is alert, oriented, no adenopathy, well-dressed, normal affect, normal respiratory effort. Examination patient has dehiscence of the lateral aspect of the wound.  It is 2 cm x 1 cm and 1 cm deep.  Imaging: No results found. No images are attached to the encounter.  Labs: Lab Results  Component Value Date   HGBA1C 9.8 (H) 10/11/2017   HGBA1C 6.6 (H) 11/19/2016   HGBA1C >15.5 (H) 08/31/2016   ESRSEDRATE 95 10/26/2017   ESRSEDRATE 92 (H) 10/10/2017   ESRSEDRATE 48 (H) 10/09/2016   CRP 13.5 (H) 10/10/2017   CRP <0.8 10/09/2016   CRP 26.1 (H) 08/30/2016   REPTSTATUS 10/15/2017 FINAL 10/10/2017   GRAMSTAIN  10/09/2016    MODERATE WBC PRESENT, PREDOMINANTLY PMN NO ORGANISMS SEEN    CULT  10/10/2017    NO GROWTH 5 DAYS Performed at [facility], [place] [address], [place], [place] [address]    [place] 10/09/2016   LABORGA KLEBSIELLA OXYTOCA 10/09/2016     @LABSALLVALUES (HGBA1)@  Body mass index is 21.61 kg/m.  Orders:  No orders of the defined types were placed in this encounter.  No orders of the defined types were placed in this encounter.    Procedures: No procedures performed  Clinical Data: No additional findings.  ROS:  All other systems negative, except as noted in the HPI. Review of Systems  Objective: Vital Signs: Ht 5\' 7"  (1.702 m)   Wt 138 lb (62.6 kg)   BMI 21.61 kg/m   Specialty Comments:  No specialty comments available.  PMFS History: Patient Active Problem List   Diagnosis Date Noted  . Dehiscence of amputation stump ([place]) 11/16/2017  . Amputee, above knee, right ([place]) 11/16/2017  . Surgical wound dehiscence 11/15/2017  . Fall at nursing home 11/02/2017  . Hypertensive heart disease, benign, with CHF ([place]) 10/22/2017  . DM (diabetes mellitus), type 2 with peripheral vascular complications ([place]) 27/01/2375  . Dyslipidemia associated with type 2 diabetes mellitus ([place]) 10/22/2017  . Oral candidiasis 10/22/2017  . Chronic iron deficiency anemia 10/22/2017  . S/P AKA (above knee amputation), right ([place])   . Malnutrition of moderate degree 10/13/2017  . Goals of care, counseling/discussion   . Palliative care encounter   . Atherosclerosis of native arteries of  extremities with gangrene, right leg ([place])   . Cellulitis 10/10/2017  . Arm wound, right, initial encounter 11/19/2016  . Soft tissue infection   . Gram-negative infection   . Severe protein-calorie malnutrition ([place]) 09/02/2016  . Elevated troponin 08/31/2016  . Pressure injury of skin 08/31/2016  . Sepsis ([place]) 08/30/2016  . Chest pain 08/30/2016  . AKI (acute kidney injury) ([place]) 08/30/2016  . Chronic diastolic CHF (congestive heart failure) ([place]) 08/30/2016  . Septic olecranon bursitis of right elbow   . Diabetic autonomic neuropathy associated with type 2 diabetes mellitus ([place]) 03/11/2016  . Legal blindness 01/16/2016  .  Essential hypertension   . Hypokalemia 04/12/2015  . 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 Hypomagnesemia   . Bacteremia due to methicillin resistant Staphylococcus aureus 04/10/2015  . Atherosclerotic peripheral vascular disease ([place]) 04/10/2015  . Acute bronchitis 04/09/2015  . Hypertension 04/09/2015  . Hyperlipidemia 04/09/2015  . Alcohol abuse 04/09/2015  . Tobacco abuse disorder 04/09/2015  . Acute encephalopathy    Past Medical History:  Diagnosis Date  . Blind right eye    S/P shotgun  . Bursitis    right Olecranon  . Chronic bronchitis ([place])   . Diabetic neuropathy ([place])   . Hyperlipemia   . Hypertension   . 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 Type II diabetes mellitus ([place])   . Wound cellulitis    right elbow open    Family History  Problem Relation Age of Onset  . Diabetes Mellitus II Mother   . Diabetes Mellitus II Father   . Diabetes Mellitus II Sister   . Diabetes Mellitus II Sister     Past Surgical History:  Procedure Laterality Date  . AMPUTATION Right 10/16/2017   Procedure: RIGHT ABOVE KNEE AMPUTATION;  Surgeon: Newt Minion, MD;  Location: [place];  Service: Orthopedics;  Laterality: Right;  . APPLICATION OF A-CELL OF EXTREMITY Right 10/15/2016   Procedure: APPLICATION OF A-CELL OF EXTREMITY;  Surgeon: Loel Lofty Dillingham, DO;  Location: [place];  Service: Plastics;  Laterality: Right;  . APPLICATION OF A-CELL OF EXTREMITY Right 11/19/2016   Procedure: APPLICATION OF A-CELL OF EXTREMITY;  Surgeon: Loel Lofty Dillingham, DO;  Location: [place];  Service: Plastics;  Laterality: Right;  . APPLICATION OF A-CELL OF EXTREMITY Right 01/13/2017   Procedure: APPLICATION OF A-CELL OF EXTREMITY;  Surgeon: Wallace Going, DO;  Location: [place];  Service: Plastics;  Laterality: Right;  . APPLICATION OF WOUND VAC Right 10/09/2016   Procedure: APPLICATION OF WOUND VAC;  Surgeon: Leandrew Koyanagi, MD;   Location: [place];  Service: Orthopedics;  Laterality: Right;  . APPLICATION OF WOUND VAC Right 10/15/2016   Procedure: WOUND VAC CHANGE;  Surgeon: Loel Lofty Dillingham, DO;  Location: [place];  Service: Plastics;  Laterality: Right;  . APPLICATION OF WOUND VAC Right 11/19/2016   Procedure: APPLICATION OF WOUND VAC;  Surgeon: Loel Lofty Dillingham, DO;  Location: [place];  Service: Plastics;  Laterality: Right;  . GRAFT APPLICATION Right 7/82/9562   Procedure: GRAFT APPLICATION  RIGHT UPPER ARM FROM DONOR SITE;  Surgeon: Wallace Going, DO;  Location: [place];  Service: Plastics;  Laterality: Right;  . I&D EXTREMITY Right 10/09/2016   Procedure: IRRIGATION AND DEBRIDEMENT EXTREMITY RIGHT ARM WOUND;  Surgeon: Leandrew Koyanagi, MD;  Location: [place];  Service: Orthopedics;  Laterality: Right;  . I&D EXTREMITY N/A 10/11/2016   Procedure: IRRIGATION AND DEBRIDEMENT EXTREMITY RIGHT  ARM WOUND;  Surgeon: Leandrew Koyanagi, MD;  Location: [place];  Service: Orthopedics;  Laterality: N/A;  . I&D EXTREMITY Right 10/15/2016   Procedure: IRRIGATION AND DEBRIDEMENT EXTREMITY/RIGHT ELBOW;  Surgeon: Loel Lofty Dillingham, DO;  Location: [place];  Service: Plastics;  Laterality: Right;  . INCISION AND DRAINAGE OF WOUND Right 10/09/2016   arm  . INCISION AND DRAINAGE OF WOUND Right 11/19/2016   Procedure: IRRIGATION AND DEBRIDEMENT WOUND;  Surgeon: Loel Lofty Dillingham, DO;  Location: [place];  Service: Plastics;  Laterality: Right;  . SKIN SPLIT GRAFT Right 01/13/2017   Procedure: SKIN GRAFT SPLIT THICKNESS;  Surgeon: Wallace Going, DO;  Location: [place];  Service: Plastics;  Laterality: Right;   Social History   Occupational History  . Not on file  Tobacco Use  . Smoking status: Current Every Day Smoker    Packs/day: 1.50    Years: 63.00    Pack years: 94.50    Types: Cigarettes  . Smokeless tobacco: Former Systems developer    Types: Chew  Substance and Sexual Activity  . Alcohol use: Yes    Comment: 01/12/2017 "nothing in 3 years"  .  Drug use: No  . Sexual activity: Not on file

## 2017-11-25 ENCOUNTER — Encounter: Payer: Self-pay | Admitting: Adult Health

## 2017-11-25 ENCOUNTER — Non-Acute Institutional Stay (SKILLED_NURSING_FACILITY): Payer: Medicare Other | Admitting: Adult Health

## 2017-11-25 DIAGNOSIS — I110 Hypertensive heart disease with heart failure: Secondary | ICD-10-CM | POA: Diagnosis not present

## 2017-11-25 DIAGNOSIS — I5032 Chronic diastolic (congestive) heart failure: Secondary | ICD-10-CM

## 2017-11-25 DIAGNOSIS — I70261 Atherosclerosis of native arteries of extremities with gangrene, right leg: Secondary | ICD-10-CM | POA: Diagnosis not present

## 2017-11-25 DIAGNOSIS — Z89611 Acquired absence of right leg above knee: Secondary | ICD-10-CM | POA: Diagnosis not present

## 2017-11-25 DIAGNOSIS — T8781 Dehiscence of amputation stump: Secondary | ICD-10-CM | POA: Diagnosis not present

## 2017-11-25 IMAGING — CT CT ELBOW*R* W/CM
3 of 4 series · 15 of 35 positions shown, 18 images · IV contrast (agent unspecified)
Comparison: 08/30/2016 elbow radiographs

CLINICAL DATA: Pus coming out of right elbow.

EXAM:
CT OF THE UPPER RIGHT EXTREMITY WITH CONTRAST
TECHNIQUE: Multidetector CT imaging of the upper right extremity was performed
according to the standard protocol following intravenous contrast
administration.

[Series 4: 2 1.5 mm st ax · axial · 0.48mm/px · z∈[-617,-454]mm · 7 of 129 slices shown, 9 images]
[im 10/129  soft-tissue]
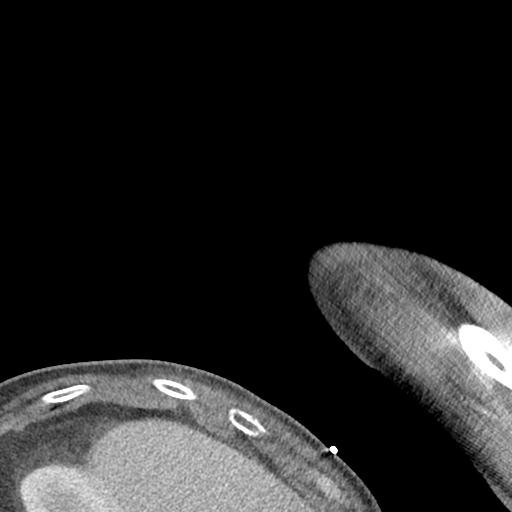
[im 10/129  bone]
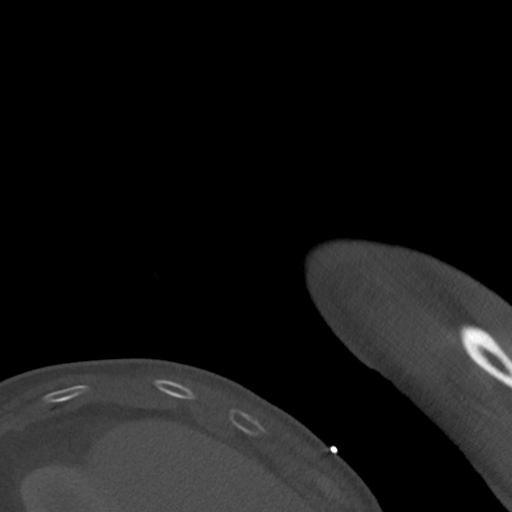
[im 30/129  bone]
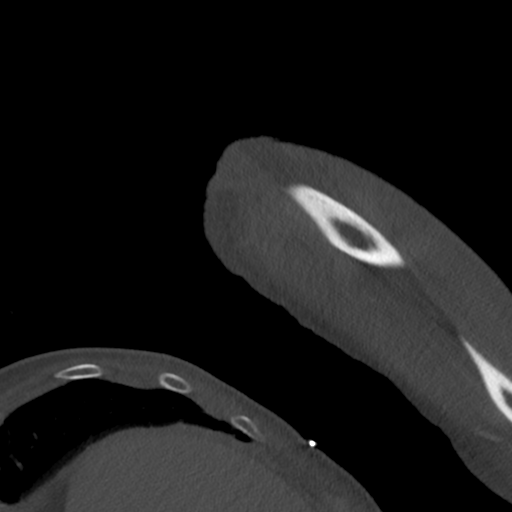
[im 50/129  bone]
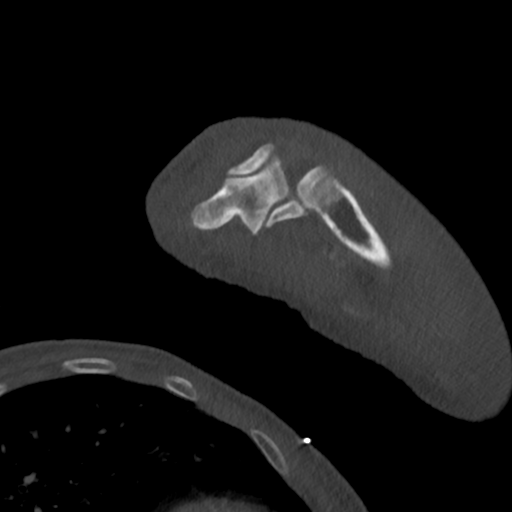
[im 69/129  bone]
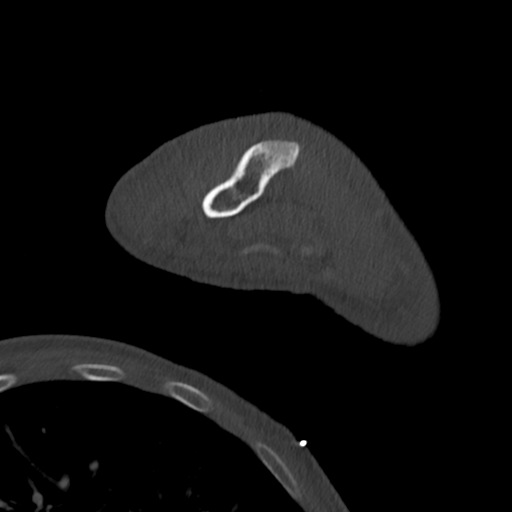
[im 79/129  soft-tissue]
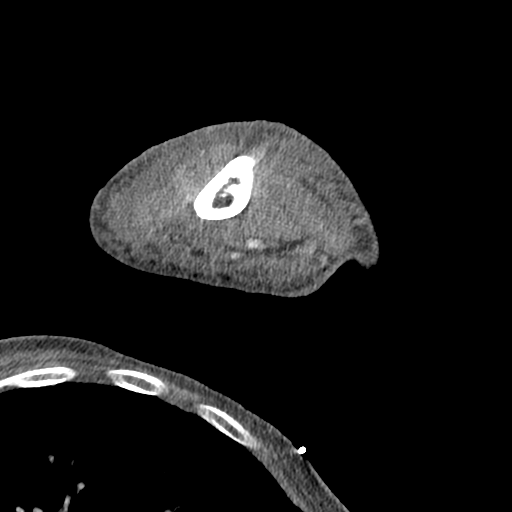
[im 79/129  bone]
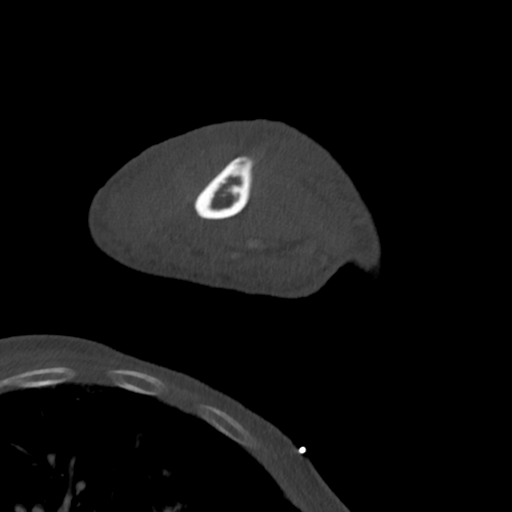
[im 99/129  bone]
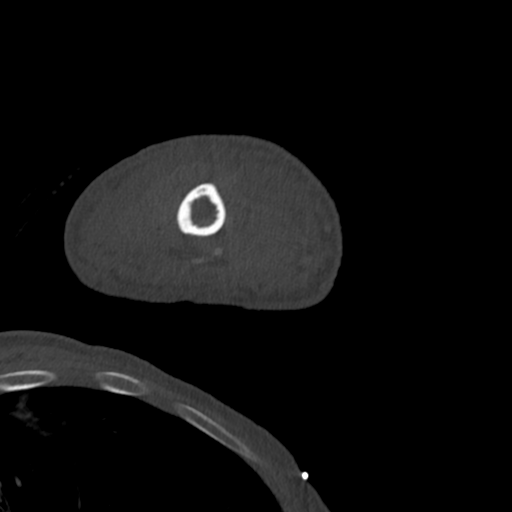
[im 119/129  bone]
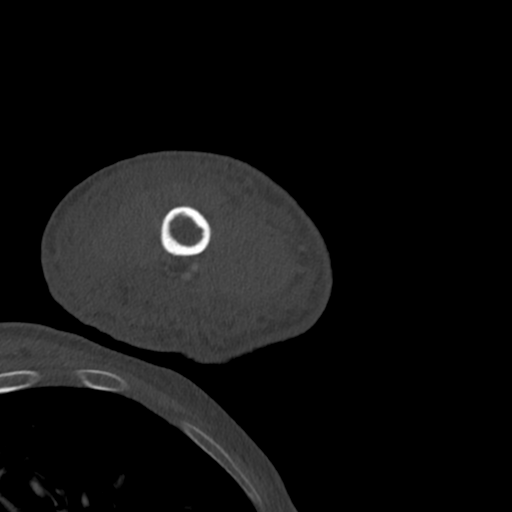

[Series 9: cor upper 1.5 mm bone · coronal · 0.37mm/px · 3 of 126 slices shown]
[im 83/126  bone]
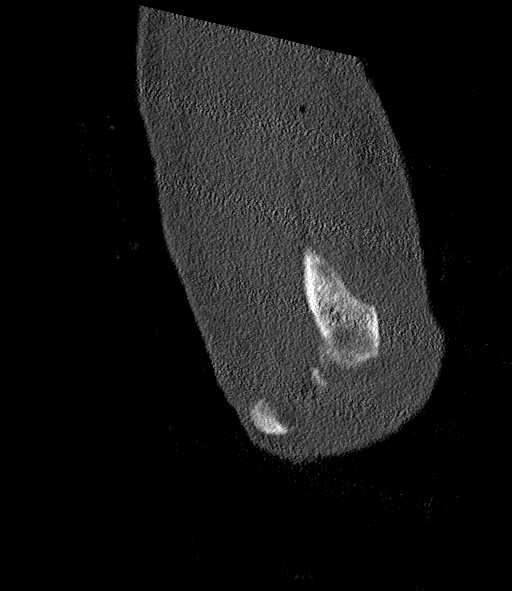
[im 97/126  bone]
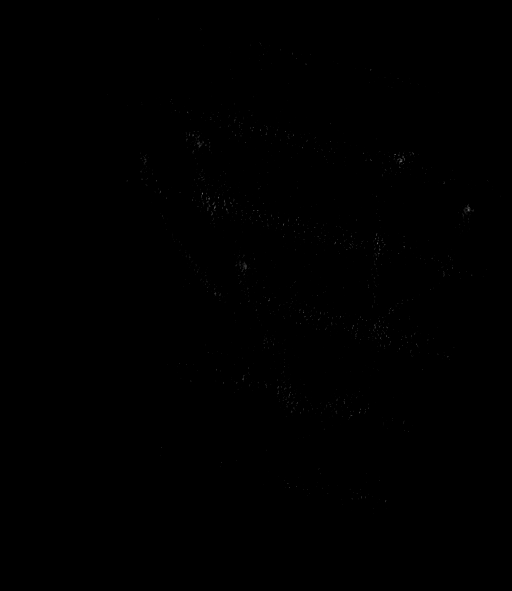
[im 111/126  bone]
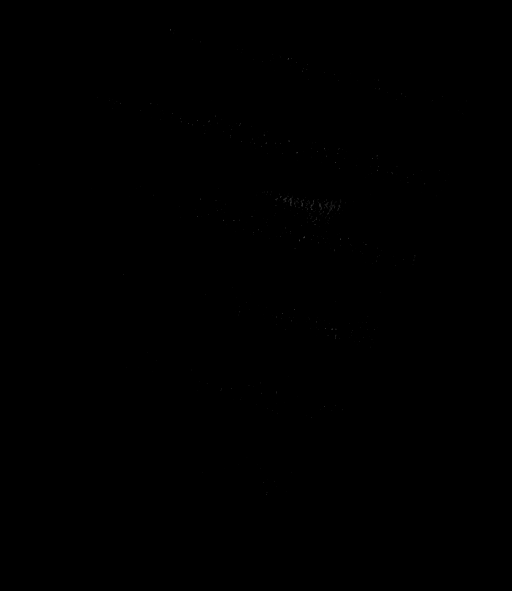

[Series 10: cor upper 1.5 mm st · sagittal · 0.34mm/px · 5 of 102 slices shown, 6 images]
[im 34/102  bone]
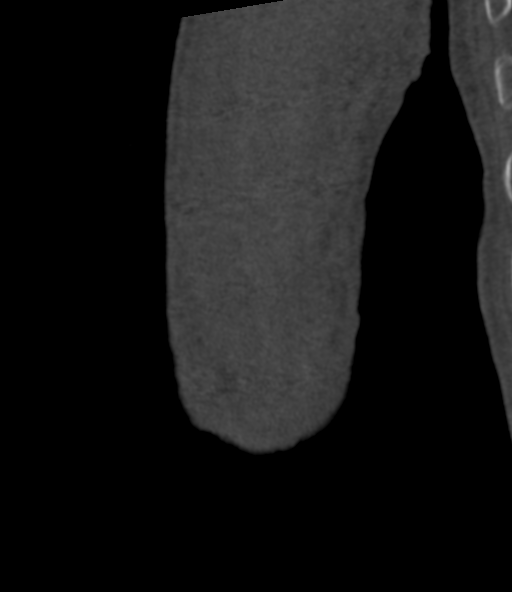
[im 43/102  bone]
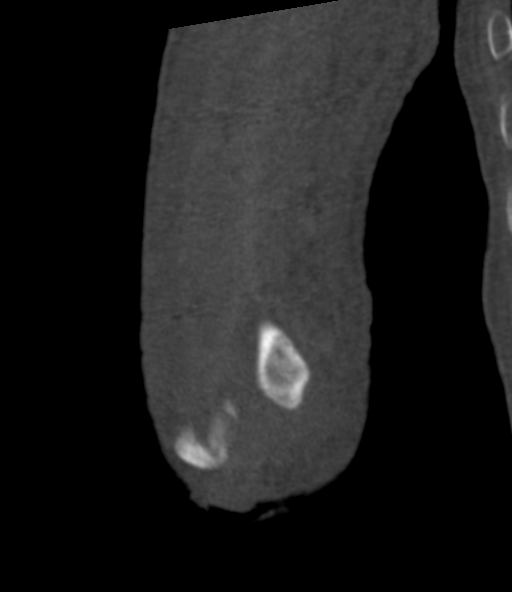
[im 51/102  soft-tissue]
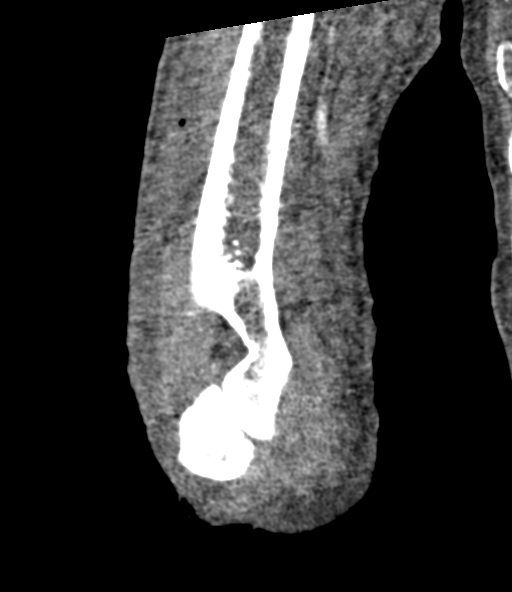
[im 51/102  bone]
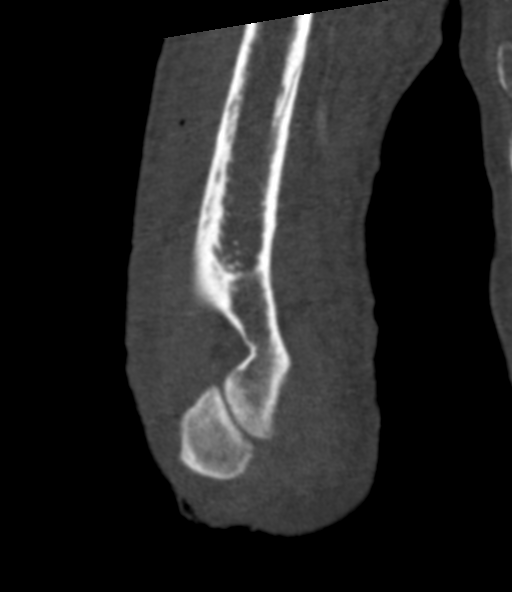
[im 59/102  bone]
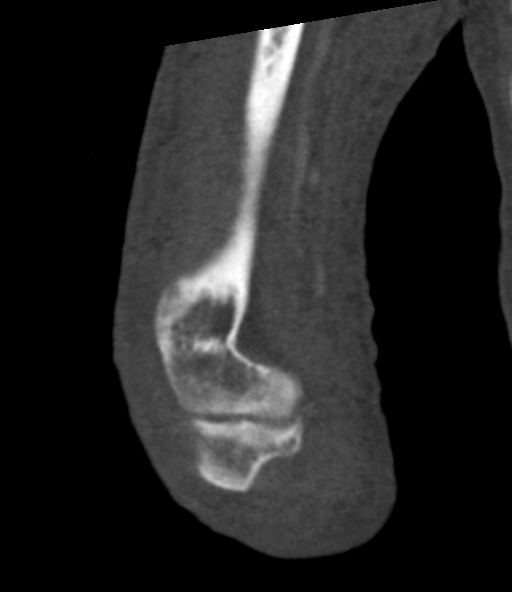
[im 68/102  bone]
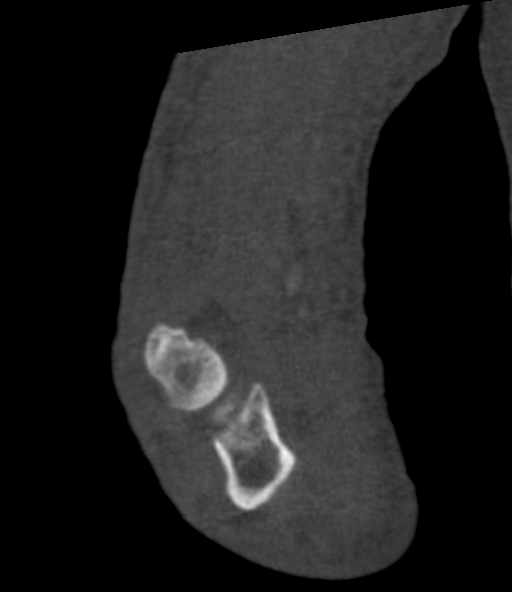

[15 of 35 positions shown; findings below may reference images not displayed]

CONTRAST:  100mL 1KKS6V-CBB IOPAMIDOL (1KKS6V-CBB) INJECTION
61%<Contrast>100mL 1KKS6V-CBB IOPAMIDOL (1KKS6V-CBB) INJECTION 61%
FINDINGS: Bones/Joint/Cartilage

Spurring at the triceps insertion on the olecranon. No fracture bone
destruction. Joint spaces are maintained about the elbow. No joint
effusion.

Ligaments

Suboptimally assessed by CT.

Muscles and Tendons

No pyomyositis.

Soft tissues

Diffuse subcutaneous edema and soft tissue induration without
drainable abscess. Tiny foci of subcutaneous emphysema along the
dorsum of the proximal forearm and distal arm. No deep or
superficial tracking of gas to suggest necrotizing fasciitis though
this is a clinical diagnosis despite imaging. Based on current
imaging findings, the findings are more likely related to extensive
cellulitis.
IMPRESSION: Diffuse soft tissue induration and edema consistent with cellulitis.
Small foci of subcutaneous emphysema are noted likely secondary to
reoprted drainage of fluid. No drainable abscess is currently
identified. No findings of osteomyelitis.
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
## 2017-12-09 ENCOUNTER — Ambulatory Visit (INDEPENDENT_AMBULATORY_CARE_PROVIDER_SITE_OTHER): Payer: Medicare Other | Admitting: Orthopedic Surgery

## 2017-12-16 ENCOUNTER — Ambulatory Visit (INDEPENDENT_AMBULATORY_CARE_PROVIDER_SITE_OTHER): Payer: Medicare Other | Admitting: Orthopedic Surgery

## 2017-12-16 ENCOUNTER — Encounter (INDEPENDENT_AMBULATORY_CARE_PROVIDER_SITE_OTHER): Payer: Self-pay | Admitting: Orthopedic Surgery

## 2017-12-16 VITALS — Ht 67.0 in | Wt 138.0 lb

## 2017-12-16 DIAGNOSIS — Z89611 Acquired absence of right leg above knee: Secondary | ICD-10-CM
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
## 2017-12-22 ENCOUNTER — Non-Acute Institutional Stay (SKILLED_NURSING_FACILITY): Payer: Medicare Other | Admitting: Adult Health

## 2017-12-22 ENCOUNTER — Encounter: Payer: Self-pay | Admitting: Adult Health

## 2017-12-22 DIAGNOSIS — I110 Hypertensive heart disease with heart failure: Secondary | ICD-10-CM

## 2017-12-22 DIAGNOSIS — I70261 Atherosclerosis of native arteries of extremities with gangrene, right leg: Secondary | ICD-10-CM | POA: Diagnosis not present

## 2017-12-22 DIAGNOSIS — I5032 Chronic diastolic (congestive) heart failure: Secondary | ICD-10-CM

## 2017-12-22 NOTE — Progress Notes (Signed)
Location:   [facility] Room Number: 226 A Place of Service:  SNF (31)   CODE STATUS: Full code  Allergies  Allergen Reactions  . Penicillins Rash and Other (See Comments)    Tolerated Zosyn Jan 2018  PATIENT HAS HAD A PCN REACTION WITH IMMEDIATE RASH, FACIAL/TONGUE/THROAT SWELLING, SOB, OR LIGHTHEADEDNESS WITH HYPOTENSION:  #  #  #  YES  #  #  #   Has patient had a PCN reaction causing severe rash involving mucus membranes or skin necrosis:NO Has patient had a PCN reaction that required hospitalization NO Has patient had a PCN reaction occurring within the last 39 years:NO    Chief Complaint  Patient presents with  . Acute Visit    Care Plan Meeting    HPI:  We have come together for his care plan meeting; with his family present. We have come together to discuss his care needs. He does not qualify for medicaid. He will need to pay privately for SNF or ALF. He will need supervision 24 hours a day at home. He is wanting to go home. We have discussed at great length about his safety. His needs will be better met in the assisted living. At this time he is in agreement with this plan. He is denying any pain; no insomnia no change in appetite. There are no nursing concerns at this time.   Past Medical History:  Diagnosis Date  . Blind right eye    S/P shotgun  . Bursitis    right Olecranon  . Chronic bronchitis ([place])   . Diabetic neuropathy ([place])   . Hyperlipemia   . Hypertension   . 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 Type II diabetes mellitus ([place])   . Wound cellulitis    right elbow open    Past Surgical History:  Procedure Laterality Date  . AMPUTATION Right 10/16/2017   Procedure: RIGHT ABOVE KNEE AMPUTATION;  Surgeon: Newt Minion, MD;  Location: [place];  Service: Orthopedics;  Laterality: Right;  . APPLICATION OF A-CELL OF EXTREMITY Right 10/15/2016   Procedure: APPLICATION OF A-CELL OF EXTREMITY;  Surgeon: Loel Lofty Dillingham, DO;  Location: [place];  Service: Plastics;  Laterality: Right;  . APPLICATION OF A-CELL OF EXTREMITY Right 11/19/2016   Procedure: APPLICATION OF A-CELL OF EXTREMITY;  Surgeon: Loel Lofty Dillingham, DO;  Location: [place];  Service: Plastics;  Laterality: Right;  . APPLICATION OF A-CELL OF EXTREMITY Right 01/13/2017   Procedure: APPLICATION OF A-CELL OF EXTREMITY;  Surgeon: Wallace Going, DO;  Location: [place];  Service: Plastics;  Laterality: Right;  . APPLICATION OF WOUND VAC Right 10/09/2016   Procedure: APPLICATION OF WOUND VAC;  Surgeon: Leandrew Koyanagi, MD;  Location: [place];  Service: Orthopedics;  Laterality: Right;  . APPLICATION OF WOUND VAC Right 10/15/2016   Procedure: WOUND VAC CHANGE;  Surgeon: Loel Lofty Dillingham, DO;  Location: [place];  Service: Plastics;  Laterality: Right;  . APPLICATION OF WOUND VAC Right 11/19/2016   Procedure: APPLICATION OF WOUND VAC;  Surgeon: Loel Lofty Dillingham, DO;  Location: [place];  Service: Plastics;  Laterality: Right;  . GRAFT APPLICATION Right 8/67/7373   Procedure: GRAFT APPLICATION  RIGHT UPPER ARM FROM DONOR SITE;  Surgeon: Wallace Going, DO;  Location: [place];  Service: Plastics;  Laterality: Right;  . I&D EXTREMITY Right 10/09/2016   Procedure: IRRIGATION AND DEBRIDEMENT EXTREMITY RIGHT ARM WOUND;  Surgeon: Leandrew Koyanagi, MD;  Location: [place];  Service: Orthopedics;  Laterality: Right;  . I&D EXTREMITY N/A 10/11/2016   Procedure: IRRIGATION AND DEBRIDEMENT EXTREMITY RIGHT ARM WOUND;  Surgeon: Leandrew Koyanagi, MD;  Location: [place];  Service: Orthopedics;  Laterality: N/A;  . I&D EXTREMITY Right 10/15/2016   Procedure: IRRIGATION AND DEBRIDEMENT EXTREMITY/RIGHT ELBOW;  Surgeon: Loel Lofty Dillingham, DO;  Location: [place];  Service: Plastics;  Laterality: Right;  . INCISION AND DRAINAGE OF WOUND Right 10/09/2016   arm  . INCISION AND DRAINAGE OF WOUND Right 11/19/2016   Procedure: IRRIGATION AND DEBRIDEMENT WOUND;  Surgeon: Loel Lofty Dillingham, DO;   Location: [place];  Service: Plastics;  Laterality: Right;  . SKIN SPLIT GRAFT Right 01/13/2017   Procedure: SKIN GRAFT SPLIT THICKNESS;  Surgeon: Wallace Going, DO;  Location: [place];  Service: Plastics;  Laterality: Right;    Social History   Socioeconomic History  . Marital status: Widowed    Spouse name: Not on file  . Number of children: Not on file  . Years of education: Not on file  . Highest education level: Not on file  Occupational History  . Not on file  Social Needs  . Financial resource strain: Not on file  . Food insecurity:    Worry: Not on file    Inability: Not on file  . Transportation needs:    Medical: Not on file    Non-medical: Not on file  Tobacco Use  . Smoking status: Current Every Day Smoker    Packs/day: 1.50    Years: 63.00    Pack years: 94.50    Types: Cigarettes  . Smokeless tobacco: Former Systems developer    Types: Chew  Substance and Sexual Activity  . Alcohol use: Yes    Comment: 01/12/2017 "nothing in 3 years"  . Drug use: No  . Sexual activity: Not on file  Lifestyle  . Physical activity:    Days per week: Not on file    Minutes per session: Not on file  . Stress: Not on file  Relationships  . Social connections:    Talks on phone: Not on file    Gets together: Not on file    Attends religious service: Not on file    Active member of club or organization: Not on file    Attends meetings of clubs or organizations: Not on file    Relationship status: Not on file  . Intimate partner violence:    Fear of current or ex partner: Not on file    Emotionally abused: Not on file    Physically abused: Not on file    Forced sexual activity: Not on file  Other Topics Concern  . Not on file  Social History Narrative  . Not on file   Family History  Problem Relation Age of Onset  . Diabetes Mellitus II Mother   . Diabetes Mellitus II Father   . Diabetes Mellitus II Sister   . Diabetes Mellitus II Sister       VITAL SIGNS BP 128/84    Pulse 74   Temp (!) 97.5 F (36.4 C)   Resp 16   Ht 5' 7"  (1.702 m)   Wt 145 lb (65.8 kg)   SpO2 98%   BMI 22.71 kg/m   Outpatient Encounter Medications as of 12/22/2017  Medication Sig  . amLODipine (NORVASC) 10 MG tablet Take 10 mg by mouth daily.  Marland Kitchen aspirin EC 325 MG tablet Take 325 mg by mouth daily.  Marland Kitchen  atorvastatin (LIPITOR) 10 MG tablet Take 1 tablet (10 mg total) by mouth daily at 6 PM.  . docusate sodium (COLACE) 100 MG capsule Take 1 capsule (100 mg total) by mouth 2 (two) times daily.  Marland Kitchen ENSURE (ENSURE) Take 237 mLs by mouth daily.  . ferrous sulfate 325 (65 FE) MG tablet Take 325 mg by mouth daily with breakfast.  . gabapentin (NEURONTIN) 100 MG capsule Take 100 mg by mouth 3 (three) times daily.  Marland Kitchen HYDROcodone-acetaminophen (NORCO/VICODIN) 5-325 MG tablet Take 1-2 tablets by mouth every 4 (four) hours as needed for moderate pain (pain score 4-6).  Marland Kitchen insulin aspart (NOVOLOG FLEXPEN) 100 UNIT/ML FlexPen Inject 6 Units into the skin daily after breakfast. Plus sliding scale  . insulin aspart (NOVOLOG FLEXPEN) 100 UNIT/ML FlexPen Inject 8 Units into the skin daily with supper. Plus sliding scale  . insulin aspart (NOVOLOG FLEXPEN) 100 UNIT/ML FlexPen Inject 8 Units into the skin daily with lunch. Plus sliding scale  . insulin aspart (NOVOLOG PENFILL) cartridge Inject as per sliding scale: 0 - 149 = 0 units 150 - 400 = 5 units Call provider is less than or equal to 60 or greater than 400  . insulin glargine (LANTUS) 100 UNIT/ML injection Inject 0.2 mLs (20 Units total) into the skin daily.  . metoprolol succinate (TOPROL-XL) 50 MG 24 hr tablet Take 50 mg by mouth daily.  . Multiple Vitamin (MULTIVITAMIN WITH MINERALS) TABS tablet Take 1 tablet by mouth daily.  Marland Kitchen nystatin (MYCOSTATIN) 100000 UNIT/ML suspension Take 5 mLs (500,000 Units total) by mouth 4 (four) times daily.  . polyethylene glycol (MIRALAX / GLYCOLAX) packet Take 17 g by mouth daily.  Marland Kitchen senna-docusate (SENOKOT-S)  8.6-50 MG tablet Take 1 tablet by mouth 2 (two) times daily.  . sitaGLIPtin (JANUVIA) 100 MG tablet Take 100 mg by mouth daily.  Marland Kitchen thiamine 100 MG tablet Take 1 tablet (100 mg total) by mouth daily.   No facility-administered encounter medications on file as of 12/22/2017.      SIGNIFICANT DIAGNOSTIC EXAMS  PREVIOUS:   10-10-17: right foot x-ray:  1. Ulcerative lesion at the right lateral malleolus with soft tissue emphysema tracking superiorly within the anterior and lateral leg to the level of the mid tibia. Findings are consistent with gangrenous infection. In the appropriate context, the findings would be compatible with a clinical diagnosis of necrotizing fasciitis. 2. No radiographic evidence of active infectious osteitis.  10-10-17: right tibia/fibula x-ray:  1. Ulcerative lesion at the right lateral malleolus with soft tissue emphysema tracking superiorly within the anterior and lateral leg to the level of the mid tibia. Findings are consistent with gangrenous infection. In the appropriate context, the findings would be compatible with a clinical diagnosis of necrotizing fasciitis. 2. No radiographic evidence of active infectious osteitis.   10-16-17: ultrasound scrotum doppler: 1. The right testicle is mildly heterogeneous in echotexture with no focal mass. The mild heterogeneity is nonspecific. No evidence of abscess. Normal blood flow identified in the right testicle. 2. The left testicle is normal in appearance. 3. Normal bilateral testicular blood flow. No evidence of torsion on this study. 4. Scrotal skin thickening consistent with history.   NO NEW EXAMS   LABS REVIEWED: PREVIOUS:   10-10-17: wbc 34.6; hgb 11.3; hct 33.6; mcv 83.2; plt 670; glucose 295; bun 14; creat 0.79; k+ 4.5; na++ 129; ca 8.8; ast 50; albumin 2.4; blood culture: no growth; CRP 13.5; sed rate 92 10-11-17: hgb a1c 9.8; chol 72; ldl 38; trig  78; hdl 18; pre-albumin <5 10-13-17: wbc 26.9; hgb 10.0; hct 30.1;  mcv 84.8; plt 737; glucose 159; bun 7; creat 0.49; k+ 3.5; na++ 132; ca 8.3 10-14-17: wbc 24.8; hgb 9.9; hct 30.0; mcv 83.6; plt 752; glucose 231; bun 9; creat 0.54; k+ 3.7; na++ 133; ca 8.5; ast 16; alt 26; alk phos 142; albumin 1.9; ammonia 36 10-16-17: wbc 16.0; hgb 8.2; hct 25.6; mcv 85.6; plt 7.32; k+ 134; bun 8; creat 0.58; k+ 4.0; na++ 133; ca 8.2 10-18-17: wbc 14.0; hgb 8.5; hct 26.5; mcv 84.7;; plt 779; vit B 12: 463; folate 18.9; iron 16; tibc 118; ferritin 312 10-21-17: wbc 13.0; hgb 8.7; hct 26.7; mcv 85.9; plt 762  NO NEW LABS.     Review of Systems  Constitutional: Negative for malaise/fatigue.  Respiratory: Negative for cough and shortness of breath.   Cardiovascular: Negative for chest pain, palpitations and leg swelling.  Gastrointestinal: Negative for abdominal pain, constipation and heartburn.  Musculoskeletal: Negative for back pain, joint pain and myalgias.  Skin: Negative.   Neurological: Negative for dizziness.  Psychiatric/Behavioral: The patient is not nervous/anxious.    Physical Exam  Constitutional: No distress.  Thin   Eyes:  Blind right eye   Neck: No thyromegaly present.  Cardiovascular: Normal rate, regular rhythm and intact distal pulses.  Murmur heard. 1/6  Pulmonary/Chest: Effort normal and breath sounds normal. No respiratory distress.  Abdominal: Soft. Bowel sounds are normal. He exhibits no distension. There is no tenderness.  Musculoskeletal: He exhibits no edema.  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  Neurological: He is alert.  Skin: Skin is warm and dry. He is not diaphoretic.  Psychiatric: He has a normal mood and affect.    ASSESSMENT/ PLAN:  TODAY:   1.   Hypertensive heart disease, benign with chf: 2. Chronic diastolic CHF: 3. 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 Verbalized understanding.     MD is aware of resident's narcotic use and is in agreement with current plan of care. We will attempt to wean resident as apropriate   Ok Edwards NP [facility]  Contact [phone] Monday through Friday 8am- 5pm  After hours call [phone]

## 2017-12-31 ENCOUNTER — Encounter: Payer: Self-pay | Admitting: Adult Health

## 2017-12-31 ENCOUNTER — Non-Acute Institutional Stay (SKILLED_NURSING_FACILITY): Payer: Medicare Other | Admitting: Adult Health

## 2017-12-31 DIAGNOSIS — E785 Hyperlipidemia, unspecified: Secondary | ICD-10-CM

## 2017-12-31 DIAGNOSIS — D509 Iron deficiency anemia, unspecified: Secondary | ICD-10-CM

## 2017-12-31 DIAGNOSIS — E1169 Type 2 diabetes mellitus with other specified complication: Secondary | ICD-10-CM | POA: Diagnosis not present

## 2017-12-31 DIAGNOSIS — E1143 Type 2 diabetes mellitus with diabetic autonomic (poly)neuropathy: Secondary | ICD-10-CM

## 2017-12-31 DIAGNOSIS — E1151 Type 2 diabetes mellitus with diabetic peripheral angiopathy without gangrene: Secondary | ICD-10-CM | POA: Diagnosis not present

## 2017-12-31 NOTE — Progress Notes (Signed)
Location:   [facility] Room Number: 226 A Place of Service:  SNF (31)   CODE STATUS: Full Code  Allergies  Allergen Reactions  . Penicillins Rash and Other (See Comments)    Tolerated Zosyn Jan 2018  PATIENT HAS HAD A PCN REACTION WITH IMMEDIATE RASH, FACIAL/TONGUE/THROAT SWELLING, SOB, OR LIGHTHEADEDNESS WITH HYPOTENSION:  #  #  #  YES  #  #  #   Has patient had a PCN reaction causing severe rash involving mucus membranes or skin necrosis:NO Has patient had a PCN reaction that required hospitalization NO Has patient had a PCN reaction occurring within the last 87 years:NO    Chief Complaint  Patient presents with  . Medical Management of Chronic Issues    Neuropathy; dyslipidemia; diabetes; anemia    HPI:  He is a 72 year old long term resident of this facility being seen for the management of his chronic illnesses: neuropathy; dyslipidemia; diabetes; anemia. His cbg readings are all elevated mostly over 200. He denies any back or joint pain; denies any change in appetite; no insomnia. There are no nursing concerns at this time.   Past Medical History:  Diagnosis Date  . Blind right eye    S/P shotgun  . Bursitis    right Olecranon  . Chronic bronchitis ([place])   . Diabetic neuropathy ([place])   . Hyperlipemia   . Hypertension   . 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 Type II diabetes mellitus ([place])   . Wound cellulitis    right elbow open    Past Surgical History:  Procedure Laterality Date  . AMPUTATION Right 10/16/2017   Procedure: RIGHT ABOVE KNEE AMPUTATION;  Surgeon: Newt Minion, MD;  Location: [place];  Service: Orthopedics;  Laterality: Right;  . APPLICATION OF A-CELL OF EXTREMITY Right 10/15/2016   Procedure: APPLICATION OF A-CELL OF EXTREMITY;  Surgeon: Loel Lofty Dillingham, DO;  Location: [place];  Service: Plastics;  Laterality: Right;  . APPLICATION OF A-CELL OF EXTREMITY Right 11/19/2016   Procedure:  APPLICATION OF A-CELL OF EXTREMITY;  Surgeon: Loel Lofty Dillingham, DO;  Location: [place];  Service: Plastics;  Laterality: Right;  . APPLICATION OF A-CELL OF EXTREMITY Right 01/13/2017   Procedure: APPLICATION OF A-CELL OF EXTREMITY;  Surgeon: Wallace Going, DO;  Location: [place];  Service: Plastics;  Laterality: Right;  . APPLICATION OF WOUND VAC Right 10/09/2016   Procedure: APPLICATION OF WOUND VAC;  Surgeon: Leandrew Koyanagi, MD;  Location: [place];  Service: Orthopedics;  Laterality: Right;  . APPLICATION OF WOUND VAC Right 10/15/2016   Procedure: WOUND VAC CHANGE;  Surgeon: Loel Lofty Dillingham, DO;  Location: [place];  Service: Plastics;  Laterality: Right;  . APPLICATION OF WOUND VAC Right 11/19/2016   Procedure: APPLICATION OF WOUND VAC;  Surgeon: Loel Lofty Dillingham, DO;  Location: [place];  Service: Plastics;  Laterality: Right;  . GRAFT APPLICATION Right 11/23/8117   Procedure: GRAFT APPLICATION  RIGHT UPPER ARM FROM DONOR SITE;  Surgeon: Wallace Going, DO;  Location: [place];  Service: Plastics;  Laterality: Right;  . I&D EXTREMITY Right 10/09/2016   Procedure: IRRIGATION AND DEBRIDEMENT EXTREMITY RIGHT ARM WOUND;  Surgeon: Leandrew Koyanagi, MD;  Location: [place];  Service: Orthopedics;  Laterality: Right;  . I&D EXTREMITY N/A 10/11/2016   Procedure: IRRIGATION AND DEBRIDEMENT EXTREMITY RIGHT ARM WOUND;  Surgeon: Leandrew Koyanagi, MD;  Location: [place];  Service: Orthopedics;  Laterality: N/A;  .  I&D EXTREMITY Right 10/15/2016   Procedure: IRRIGATION AND DEBRIDEMENT EXTREMITY/RIGHT ELBOW;  Surgeon: Loel Lofty Dillingham, DO;  Location: [place];  Service: Plastics;  Laterality: Right;  . INCISION AND DRAINAGE OF WOUND Right 10/09/2016   arm  . INCISION AND DRAINAGE OF WOUND Right 11/19/2016   Procedure: IRRIGATION AND DEBRIDEMENT WOUND;  Surgeon: Loel Lofty Dillingham, DO;  Location: [place];  Service: Plastics;  Laterality: Right;  . SKIN SPLIT GRAFT Right 01/13/2017   Procedure: SKIN GRAFT SPLIT THICKNESS;   Surgeon: Wallace Going, DO;  Location: [place];  Service: Plastics;  Laterality: Right;    Social History   Socioeconomic History  . Marital status: Widowed    Spouse name: Not on file  . Number of children: Not on file  . Years of education: Not on file  . Highest education level: Not on file  Occupational History  . Not on file  Social Needs  . Financial resource strain: Not on file  . Food insecurity:    Worry: Not on file    Inability: Not on file  . Transportation needs:    Medical: Not on file    Non-medical: Not on file  Tobacco Use  . Smoking status: Current Every Day Smoker    Packs/day: 1.50    Years: 63.00    Pack years: 94.50    Types: Cigarettes  . Smokeless tobacco: Former Systems developer    Types: Chew  Substance and Sexual Activity  . Alcohol use: Yes    Comment: 01/12/2017 "nothing in 3 years"  . Drug use: No  . Sexual activity: Not on file  Lifestyle  . Physical activity:    Days per week: Not on file    Minutes per session: Not on file  . Stress: Not on file  Relationships  . Social connections:    Talks on phone: Not on file    Gets together: Not on file    Attends religious service: Not on file    Active member of club or organization: Not on file    Attends meetings of clubs or organizations: Not on file    Relationship status: Not on file  . Intimate partner violence:    Fear of current or ex partner: Not on file    Emotionally abused: Not on file    Physically abused: Not on file    Forced sexual activity: Not on file  Other Topics Concern  . Not on file  Social History Narrative  . Not on file   Family History  Problem Relation Age of Onset  . Diabetes Mellitus II Mother   . Diabetes Mellitus II Father   . Diabetes Mellitus II Sister   . Diabetes Mellitus II Sister       VITAL SIGNS BP 128/74   Pulse 78   Temp 98.2 F (36.8 C)   Resp 18   Ht 5' 7"  (1.702 m)   Wt 144 lb (65.3 kg)   SpO2 97%   BMI 22.55 kg/m   Outpatient  Encounter Medications as of 12/31/2017  Medication Sig  . amLODipine (NORVASC) 10 MG tablet Take 10 mg by mouth daily.  Marland Kitchen aspirin EC 325 MG tablet Take 325 mg by mouth daily.  Marland Kitchen atorvastatin (LIPITOR) 10 MG tablet Take 1 tablet (10 mg total) by mouth daily at 6 PM.  . docusate sodium (COLACE) 100 MG capsule Take 1 capsule (100 mg total) by mouth 2 (two) times daily.  Marland Kitchen ENSURE (ENSURE) Take 237  mLs by mouth daily.  . ferrous sulfate 325 (65 FE) MG tablet Take 325 mg by mouth daily with breakfast.  . gabapentin (NEURONTIN) 100 MG capsule Take 100 mg by mouth 3 (three) times daily.  Marland Kitchen HYDROcodone-acetaminophen (NORCO/VICODIN) 5-325 MG tablet Take 1-2 tablets by mouth every 4 (four) hours as needed for moderate pain (pain score 4-6).  Marland Kitchen insulin aspart (NOVOLOG FLEXPEN) 100 UNIT/ML FlexPen Inject 6 Units into the skin daily after breakfast. Plus sliding scale  . insulin aspart (NOVOLOG FLEXPEN) 100 UNIT/ML FlexPen Inject 8 Units into the skin daily with supper. Plus sliding scale  . insulin aspart (NOVOLOG FLEXPEN) 100 UNIT/ML FlexPen Inject 8 Units into the skin daily with lunch. Plus sliding scale  . insulin aspart (NOVOLOG PENFILL) cartridge Inject as per sliding scale: 0 - 149 = 0 units 150 - 400 = 7 units Call provider is less than or equal to 60 or greater than 400  . insulin glargine (LANTUS) 100 UNIT/ML injection Inject 0.2 mLs (20 Units total) into the skin daily.  . metoprolol succinate (TOPROL-XL) 50 MG 24 hr tablet Take 50 mg by mouth daily.  . Multiple Vitamin (MULTIVITAMIN WITH MINERALS) TABS tablet Take 1 tablet by mouth daily.  Marland Kitchen nystatin (MYCOSTATIN) 100000 UNIT/ML suspension Take 5 mLs (500,000 Units total) by mouth 4 (four) times daily.  . polyethylene glycol (MIRALAX / GLYCOLAX) packet Take 17 g by mouth daily.  Marland Kitchen senna-docusate (SENOKOT-S) 8.6-50 MG tablet Take 1 tablet by mouth 2 (two) times daily.  . sitaGLIPtin (JANUVIA) 100 MG tablet Take 100 mg by mouth daily.  Marland Kitchen thiamine  100 MG tablet Take 1 tablet (100 mg total) by mouth daily.   No facility-administered encounter medications on file as of 12/31/2017.      SIGNIFICANT DIAGNOSTIC EXAMS   PREVIOUS:   10-10-17: right foot x-ray:  1. Ulcerative lesion at the right lateral malleolus with soft tissue emphysema tracking superiorly within the anterior and lateral leg to the level of the mid tibia. Findings are consistent with gangrenous infection. In the appropriate context, the findings would be compatible with a clinical diagnosis of necrotizing fasciitis. 2. No radiographic evidence of active infectious osteitis.  10-10-17: right tibia/fibula x-ray:  1. Ulcerative lesion at the right lateral malleolus with soft tissue emphysema tracking superiorly within the anterior and lateral leg to the level of the mid tibia. Findings are consistent with gangrenous infection. In the appropriate context, the findings would be compatible with a clinical diagnosis of necrotizing fasciitis. 2. No radiographic evidence of active infectious osteitis.   10-16-17: ultrasound scrotum doppler: 1. The right testicle is mildly heterogeneous in echotexture with no focal mass. The mild heterogeneity is nonspecific. No evidence of abscess. Normal blood flow identified in the right testicle. 2. The left testicle is normal in appearance. 3. Normal bilateral testicular blood flow. No evidence of torsion on this study. 4. Scrotal skin thickening consistent with history.   NO NEW EXAMS   LABS REVIEWED: PREVIOUS:   10-10-17: wbc 34.6; hgb 11.3; hct 33.6; mcv 83.2; plt 670; glucose 295; bun 14; creat 0.79; k+ 4.5; na++ 129; ca 8.8; ast 50; albumin 2.4; blood culture: no growth; CRP 13.5; sed rate 92 10-11-17: hgb a1c 9.8; chol 72; ldl 38; trig 78; hdl 18; pre-albumin <5 10-13-17: wbc 26.9; hgb 10.0; hct 30.1; mcv 84.8; plt 737; glucose 159; bun 7; creat 0.49; k+ 3.5; na++ 132; ca 8.3 10-14-17: wbc 24.8; hgb 9.9; hct 30.0; mcv 83.6; plt 752; glucose  231;  bun 9; creat 0.54; k+ 3.7; na++ 133; ca 8.5; ast 16; alt 26; alk phos 142; albumin 1.9; ammonia 36 10-16-17: wbc 16.0; hgb 8.2; hct 25.6; mcv 85.6; plt 7.32; k+ 134; bun 8; creat 0.58; k+ 4.0; na++ 133; ca 8.2 10-18-17: wbc 14.0; hgb 8.5; hct 26.5; mcv 84.7; plt 779; vit B 12: 463; folate 18.9; iron 16; tibc 118; ferritin 312 10-21-17: wbc 13.0; hgb 8.7; hct 26.7; mcv 85.9; plt 762  TODAY:   10-25-17: wbc 13.3; hgb 9.1; hct 27.9; mcv 86.1; plt 701      Review of Systems  Constitutional: Negative for malaise/fatigue.  Respiratory: Negative for cough and shortness of breath.   Cardiovascular: Negative for chest pain, palpitations and leg swelling.  Gastrointestinal: Negative for abdominal pain, constipation and heartburn.  Musculoskeletal: Negative for back pain, joint pain and myalgias.  Skin: Negative.   Neurological: Negative for dizziness.  Psychiatric/Behavioral: The patient is not nervous/anxious.      Physical Exam  Constitutional: No distress.  Thin   Eyes:  Blind right eye   Neck: No thyromegaly present.  Cardiovascular: Normal rate, regular rhythm and intact distal pulses.  Murmur heard. 1/6  Pulmonary/Chest: Effort normal and breath sounds normal. No respiratory distress.  Abdominal: Soft. Bowel sounds are normal. He exhibits no distension. There is no tenderness.  Musculoskeletal: He exhibits no edema.  Is status post right aka Status post right thumb amputation Is able to move all extremities    Lymphadenopathy:    He has no cervical adenopathy.  Neurological: He is alert.  Skin: Skin is warm and dry. He is not diaphoretic.  Psychiatric: He has a normal mood and affect.     ASSESSMENT/ PLAN:  TODAY:   1. Dyslipidemia associated with type 2 diabetes mellitus: stable ldl 38 will continue lipitor 10 mg daily   2. Diabetic autonomic neuropathy associated with type 2 diabetes mellitus: stable will continue neurontin  100 mg three times daily   3. DM type 2  with peripheral vascular complications: without change hgb a1c 9.8 will continue  januvia 100 mg daily; will increase lantus to 25 units nightly and will change novolog to 13 units after meals.   4. Chronic iron deficiency anemia: stable hgb 8.7; iron 16 will continue iron  daily    PREVIOUS    5. Chronic constipation: stable will continue senna s twice daily; miralax daily   6. Severe protein calorie malnutrition: without changes albumin 1.9 with pre-albumin <5 will continue supplements as directed and prostat  7. Hypertensive heart disease, benign with chf: stable: b/p 128/74 will continue norvasc 10 mg daily;toprol xl 50 mg daily   and asa 325 mg daily   8. Chronic diastolic CHF: stable will continue to monitor his status.   9. 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 Is on asa 325 mg daily     MD is aware of resident's narcotic use and is in agreement with current plan of care. We will attempt to wean resident as apropriate   Ok Edwards NP [facility]  Contact [phone] Monday through Friday 8am- 5pm  After hours call [phone]

## 2018-01-06 ENCOUNTER — Encounter: Payer: Self-pay | Admitting: Adult Health

## 2018-01-06 ENCOUNTER — Non-Acute Institutional Stay (SKILLED_NURSING_FACILITY): Payer: Medicare Other | Admitting: Adult Health

## 2018-01-06 DIAGNOSIS — E1151 Type 2 diabetes mellitus with diabetic peripheral angiopathy without gangrene: Secondary | ICD-10-CM

## 2018-01-06 NOTE — Progress Notes (Signed)
Location:   [facility] Room Number: 226 A Place of Service:  SNF ([id number])   CODE STATUS: Full code  Allergies  Allergen Reactions  . Penicillins Rash and Other (See Comments)    Tolerated Zosyn Jan 2018  PATIENT HAS HAD A PCN REACTION WITH IMMEDIATE RASH, FACIAL/TONGUE/THROAT SWELLING, SOB, OR LIGHTHEADEDNESS WITH HYPOTENSION:  #  #  #  YES  #  #  #   Has patient had a PCN reaction causing severe rash involving mucus membranes or skin necrosis:NO Has patient had a PCN reaction that required hospitalization NO Has patient had a PCN reaction occurring within the last 10 years:NO    Chief Complaint  Patient presents with  . Acute Visit    diabetes      HPI:  His cbg readings have been variable from the 90's to low 400's . He is taking januvia 100 mg daily novolog 13 units after meals with an additional 7 units for cbg >=150; and latnus 25 units nightly. There are no reports of missed doses. No reports of hypoglycemia; excessive hunger or thirst.    Past Medical History:  Diagnosis Date  . Blind right eye    S/P shotgun  . Bursitis    right Olecranon  . Chronic bronchitis (HCC)   . Diabetic neuropathy (HCC)   . Hyperlipemia   . Hypertension   . 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 Type II diabetes mellitus (HCC)   . Wound cellulitis    right elbow open    Past Surgical History:  Procedure Laterality Date  . AMPUTATION Right 10/16/2017   Procedure: RIGHT ABOVE KNEE AMPUTATION;  Surgeon: Duda, Marcus V, MD;  Location: [facility] OR;  Service: Orthopedics;  Laterality: Right;  . APPLICATION OF A-CELL OF EXTREMITY Right 10/15/2016   Procedure: APPLICATION OF A-CELL OF EXTREMITY;  Surgeon: Claire S Dillingham, DO;  Location: [facility] OR;  Service: Plastics;  Laterality: Right;  . APPLICATION OF A-CELL OF EXTREMITY Right 11/19/2016   Procedure: APPLICATION OF A-CELL OF EXTREMITY;  Surgeon: Claire S Dillingham, DO;  Location: [facility] OR;  Service:  Plastics;  Laterality: Right;  . APPLICATION OF A-CELL OF EXTREMITY Right 01/13/2017   Procedure: APPLICATION OF A-CELL OF EXTREMITY;  Surgeon: Dillingham, Claire S, DO;  Location: [facility] OR;  Service: Plastics;  Laterality: Right;  . APPLICATION OF WOUND VAC Right 10/09/2016   Procedure: APPLICATION OF WOUND VAC;  Surgeon: Naiping M Xu, MD;  Location: [facility] OR;  Service: Orthopedics;  Laterality: Right;  . APPLICATION OF WOUND VAC Right 10/15/2016   Procedure: WOUND VAC CHANGE;  Surgeon: Claire S Dillingham, DO;  Location: [facility] OR;  Service: Plastics;  Laterality: Right;  . APPLICATION OF WOUND VAC Right 11/19/2016   Procedure: APPLICATION OF WOUND VAC;  Surgeon: Claire S Dillingham, DO;  Location: [facility] OR;  Service: Plastics;  Laterality: Right;  . GRAFT APPLICATION Right 01/13/2017   Procedure: GRAFT APPLICATION  RIGHT UPPER ARM FROM DONOR SITE;  Surgeon: Dillingham, Claire S, DO;  Location: [facility] OR;  Service: Plastics;  Laterality: Right;  . I&D EXTREMITY Right 10/09/2016   Procedure: IRRIGATION AND DEBRIDEMENT EXTREMITY RIGHT ARM WOUND;  Surgeon: Naiping M Xu, MD;  Location: [facility];  Service: Orthopedics;  Laterality: Right;  . I&D EXTREMITY N/A 10/11/2016   Procedure: IRRIGATION AND DEBRIDEMENT EXTREMITY RIGHT ARM WOUND;  Surgeon: Naiping M Xu, MD;  Location: [facility];  Service: Orthopedics;  Laterality: N/A;  . I&D EXTREMITY Right 10/15/2016     Procedure: IRRIGATION AND DEBRIDEMENT EXTREMITY/RIGHT ELBOW;  Surgeon: Claire S Dillingham, DO;  Location: [facility] OR;  Service: Plastics;  Laterality: Right;  . INCISION AND DRAINAGE OF WOUND Right 10/09/2016   arm  . INCISION AND DRAINAGE OF WOUND Right 11/19/2016   Procedure: IRRIGATION AND DEBRIDEMENT WOUND;  Surgeon: Claire S Dillingham, DO;  Location: [facility] OR;  Service: Plastics;  Laterality: Right;  . SKIN SPLIT GRAFT Right 01/13/2017   Procedure: SKIN GRAFT SPLIT THICKNESS;  Surgeon: Dillingham, Claire S, DO;  Location: [facility] OR;  Service: Plastics;  Laterality: Right;     Social History   Socioeconomic History  . Marital status: Widowed    Spouse name: Not on file  . Number of children: Not on file  . Years of education: Not on file  . Highest education level: Not on file  Occupational History  . Not on file  Social Needs  . Financial resource strain: Not on file  . Food insecurity:    Worry: Not on file    Inability: Not on file  . Transportation needs:    Medical: Not on file    Non-medical: Not on file  Tobacco Use  . Smoking status: Current Every Day Smoker    Packs/day: 1.50    Years: 63.00    Pack years: 94.50    Types: Cigarettes  . Smokeless tobacco: Former User    Types: Chew  Substance and Sexual Activity  . Alcohol use: Yes    Comment: 01/12/2017 "nothing in 3 years"  . Drug use: No  . Sexual activity: Not on file  Lifestyle  . Physical activity:    Days per week: Not on file    Minutes per session: Not on file  . Stress: Not on file  Relationships  . Social connections:    Talks on phone: Not on file    Gets together: Not on file    Attends religious service: Not on file    Active member of club or organization: Not on file    Attends meetings of clubs or organizations: Not on file    Relationship status: Not on file  . Intimate partner violence:    Fear of current or ex partner: Not on file    Emotionally abused: Not on file    Physically abused: Not on file    Forced sexual activity: Not on file  Other Topics Concern  . Not on file  Social History Narrative  . Not on file   Family History  Problem Relation Age of Onset  . Diabetes Mellitus II Mother   . Diabetes Mellitus II Father   . Diabetes Mellitus II Sister   . Diabetes Mellitus II Sister       VITAL SIGNS BP 138/62   Pulse 72   Temp (!) 97.1 F (36.2 C)   Resp 18   Ht 5' 7" (1.702 m)   Wt 144 lb (65.3 kg)   SpO2 98%   BMI 22.55 kg/m   Outpatient Encounter Medications as of 01/06/2018  Medication Sig  . amLODipine (NORVASC) 10 MG  tablet Take 10 mg by mouth daily.  . aspirin EC 325 MG tablet Take 325 mg by mouth daily.  . atorvastatin (LIPITOR) 10 MG tablet Take 1 tablet (10 mg total) by mouth daily at 6 PM.  . docusate sodium (COLACE) 100 MG capsule Take 1 capsule (100 mg total) by mouth 2 (two) times daily.  . ENSURE (ENSURE) Take 237 mLs by mouth daily.  .   ferrous sulfate 325 (65 FE) MG tablet Take 325 mg by mouth daily with breakfast.  . gabapentin (NEURONTIN) 100 MG capsule Take 100 mg by mouth 3 (three) times daily.  . HYDROcodone-acetaminophen (NORCO/VICODIN) 5-325 MG tablet Take 1-2 tablets by mouth every 4 (four) hours as needed for moderate pain (pain score 4-6).  . insulin aspart (NOVOLOG FLEXPEN) 100 UNIT/ML FlexPen Inject 13 Units into the skin 3 (three) times daily after meals.  . insulin aspart (NOVOLOG PENFILL) cartridge Inject as per sliding scale: 0 - 149 = 0 units 150 - 400 = 7 units Call provider is less than or equal to 60 or greater than 400  . insulin glargine (LANTUS) 100 UNIT/ML injection Inject 25 Units into the skin at bedtime.  . metoprolol succinate (TOPROL-XL) 50 MG 24 hr tablet Take 50 mg by mouth daily.  . Multiple Vitamin (MULTIVITAMIN WITH MINERALS) TABS tablet Take 1 tablet by mouth daily.  . polyethylene glycol (MIRALAX / GLYCOLAX) packet Take 17 g by mouth daily.  . senna-docusate (SENOKOT-S) 8.6-50 MG tablet Take 1 tablet by mouth 2 (two) times daily.  . sitaGLIPtin (JANUVIA) 100 MG tablet Take 100 mg by mouth daily.  . thiamine 100 MG tablet Take 1 tablet (100 mg total) by mouth daily.  . [DISCONTINUED] insulin aspart (NOVOLOG FLEXPEN) 100 UNIT/ML FlexPen Inject 6 Units into the skin daily after breakfast. Plus sliding scale  . [DISCONTINUED] insulin aspart (NOVOLOG FLEXPEN) 100 UNIT/ML FlexPen Inject 8 Units into the skin daily with supper. Plus sliding scale  . [DISCONTINUED] insulin aspart (NOVOLOG FLEXPEN) 100 UNIT/ML FlexPen Inject 8 Units into the skin daily with lunch. Plus  sliding scale  . [DISCONTINUED] insulin glargine (LANTUS) 100 UNIT/ML injection Inject 0.2 mLs (20 Units total) into the skin daily. (Patient not taking: Reported on 01/06/2018)  . [DISCONTINUED] nystatin (MYCOSTATIN) 100000 UNIT/ML suspension Take 5 mLs (500,000 Units total) by mouth 4 (four) times daily. (Patient not taking: Reported on 01/06/2018)   No facility-administered encounter medications on file as of 01/06/2018.      SIGNIFICANT DIAGNOSTIC EXAMS  PREVIOUS:   10-10-17: right foot x-ray:  1. Ulcerative lesion at the right lateral malleolus with soft tissue emphysema tracking superiorly within the anterior and lateral leg to the level of the mid tibia. Findings are consistent with gangrenous infection. In the appropriate context, the findings would be compatible with a clinical diagnosis of necrotizing fasciitis. 2. No radiographic evidence of active infectious osteitis.  10-10-17: right tibia/fibula x-ray:  1. Ulcerative lesion at the right lateral malleolus with soft tissue emphysema tracking superiorly within the anterior and lateral leg to the level of the mid tibia. Findings are consistent with gangrenous infection. In the appropriate context, the findings would be compatible with a clinical diagnosis of necrotizing fasciitis. 2. No radiographic evidence of active infectious osteitis.   10-16-17: ultrasound scrotum doppler: 1. The right testicle is mildly heterogeneous in echotexture with no focal mass. The mild heterogeneity is nonspecific. No evidence of abscess. Normal blood flow identified in the right testicle. 2. The left testicle is normal in appearance. 3. Normal bilateral testicular blood flow. No evidence of torsion on this study. 4. Scrotal skin thickening consistent with history.   NO NEW EXAMS   LABS REVIEWED: PREVIOUS:   10-10-17: wbc 34.6; hgb 11.3; hct 33.6; mcv 83.2; plt 670; glucose 295; bun 14; creat 0.79; k+ 4.5; na++ 129; ca 8.8; ast 50; albumin 2.4; blood  culture: no growth; CRP 13.5; sed rate 92 10-11-17: hgb a1c   9.8; chol 72; ldl 38; trig 78; hdl 18; pre-albumin <5 10-13-17: wbc 26.9; hgb 10.0; hct 30.1; mcv 84.8; plt 737; glucose 159; bun 7; creat 0.49; k+ 3.5; na++ 132; ca 8.3 10-14-17: wbc 24.8; hgb 9.9; hct 30.0; mcv 83.6; plt 752; glucose 231; bun 9; creat 0.54; k+ 3.7; na++ 133; ca 8.5; ast 16; alt 26; alk phos 142; albumin 1.9; ammonia 36 10-16-17: wbc 16.0; hgb 8.2; hct 25.6; mcv 85.6; plt 7.32; k+ 134; bun 8; creat 0.58; k+ 4.0; na++ 133; ca 8.2 10-18-17: wbc 14.0; hgb 8.5; hct 26.5; mcv 84.7; plt 779; vit B 12: 463; folate 18.9; iron 16; tibc 118; ferritin 312 10-21-17: wbc 13.0; hgb 8.7; hct 26.7; mcv 85.9; plt 762 10-25-17: wbc 13.3; hgb 9.1; hct 27.9; mcv 86.1; plt 701    NO NEW LABS.    Review of Systems  Constitutional: Negative for malaise/fatigue.  Respiratory: Negative for cough and shortness of breath.   Cardiovascular: Negative for chest pain, palpitations and leg swelling.  Gastrointestinal: Negative for abdominal pain, constipation and heartburn.  Musculoskeletal: Negative for back pain, joint pain and myalgias.  Skin: Negative.   Neurological: Negative for dizziness.  Psychiatric/Behavioral: The patient is not nervous/anxious.     Physical Exam  Constitutional: No distress.  Thin   Eyes:  Blind right eye  Neck: No thyromegaly present.  Cardiovascular: Normal rate, regular rhythm and intact distal pulses.  Murmur heard. 1/6  Pulmonary/Chest: Effort normal and breath sounds normal. No respiratory distress.  Abdominal: Soft. Bowel sounds are normal. He exhibits no distension. There is no tenderness.  Musculoskeletal: He exhibits no edema.  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  Neurological: He is alert.  Skin: Skin is warm and dry. He is not diaphoretic.  Psychiatric: He has a normal mood and affect.     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 With an additional 7 units for cbg >=150. He is on asa statin. At this time will continue this plan of care; will monitor and will further adjustments as indicated.     MD is aware of resident's narcotic use and is in agreement with current plan of care. We will attempt to wean resident as apropriate   Deborah Green NP [facility]  Contact [phone] Monday through Friday 8am- 5pm  After hours call [phone]  

## 2018-01-13 ENCOUNTER — Non-Acute Institutional Stay (SKILLED_NURSING_FACILITY): Payer: Medicare Other

## 2018-01-13 DIAGNOSIS — Z0000 Encounter for general adult medical examination without abnormal findings: Secondary | ICD-10-CM

## 2018-01-13 NOTE — Patient Instructions (Addendum)
Andrew [name] , Thank you for taking time to come for your Medicare Wellness Visit. I appreciate your ongoing commitment to your health goals. Please review the following plan we discussed and let me know if I can assist you in the future.   Screening recommendations/referrals: Colonoscopy due, ordered (FOBT) Recommended yearly ophthalmology/optometry visit for glaucoma screening and checkup Recommended yearly dental visit for hygiene and checkup  Vaccinations: Influenza vaccine due 2019 fall season Pneumococcal vaccine due, ordered Tdap vaccine up to date, due 10/11/2027 Shingles vaccine not in past records    Advanced directives: need a copy for chart  Conditions/risks identified: none  Next appointment: Dr. Eulas Post makes rounds  Preventive Care 73 Years and Older, Male Preventive care refers to lifestyle choices and visits with your health care provider that can promote health and wellness. What does preventive care include?  A yearly physical exam. This is also called an annual well check.  Dental exams once or twice a year.  Routine eye exams. Ask your health care provider how often you should have your eyes checked.  Personal lifestyle choices, including:  Daily care of your teeth and gums.  Regular physical activity.  Eating a healthy diet.  Avoiding tobacco and drug use.  Limiting alcohol use.  Practicing safe sex.  Taking low doses of aspirin every day.  Taking vitamin and mineral supplements as recommended by your health care provider. What happens during an annual well check? The services and screenings done by your health care provider during your annual well check will depend on your age, overall health, lifestyle risk factors, and family history of disease. Counseling  Your health care provider may ask you questions about your:  Alcohol use.  Tobacco use.  Drug use.  Emotional well-being.  Home and relationship well-being.  Sexual  activity.  Eating habits.  History of falls.  Memory and ability to understand (cognition).  Work and work Statistician. Screening  You may have the following tests or measurements:  Height, weight, and BMI.  Blood pressure.  Lipid and cholesterol levels. These may be checked every 5 years, or more frequently if you are over 70 years old.  Skin check.  Lung cancer screening. You may have this screening every year starting at age 9 if you have a 30-pack-year history of smoking and currently smoke or have quit within the past 15 years.  Fecal occult blood test (FOBT) of the stool. You may have this test every year starting at age 73.  Flexible sigmoidoscopy or colonoscopy. You may have a sigmoidoscopy every 5 years or a colonoscopy every 10 years starting at age 73.  Prostate cancer screening. Recommendations will vary depending on your family history and other risks.  Hepatitis C blood test.  Hepatitis B blood test.  Sexually transmitted disease (STD) testing.  Diabetes screening. This is done by checking your blood sugar (glucose) after you have not eaten for a while (fasting). You may have this done every 1-3 years.  Abdominal aortic aneurysm (AAA) screening. You may need this if you are a current or former smoker.  Osteoporosis. You may be screened starting at age 73 if you are at high risk. Talk with your health care provider about your test results, treatment options, and if necessary, the need for more tests. Vaccines  Your health care provider may recommend certain vaccines, such as:  Influenza vaccine. This is recommended every year.  Tetanus, diphtheria, and acellular pertussis (Tdap, Td) vaccine. You may need a Td booster every  10 years.  Zoster vaccine. You may need this after age 73.  Pneumococcal 13-valent conjugate (PCV13) vaccine. One dose is recommended after age 73.  Pneumococcal polysaccharide (PPSV23) vaccine. One dose is recommended after age  73. Talk to your health care provider about which screenings and vaccines you need and how often you need them. This information is not intended to replace advice given to you by your health care provider. Make sure you discuss any questions you have with your health care provider. Document Released: 08/30/2015 Document Revised: 04/22/2016 Document Reviewed: 06/04/2015 Elsevier Interactive Patient Education  2017 [place] Prevention in the Home Falls can cause injuries. They can happen to people of all ages. There are many things you can do to make your home safe and to help prevent falls. What can I do on the outside of my home?  Regularly fix the edges of walkways and driveways and fix any cracks.  Remove anything that might make you trip as you walk through a door, such as a raised step or threshold.  Trim any bushes or trees on the path to your home.  Use bright outdoor lighting.  Clear any walking paths of anything that might make someone trip, such as rocks or tools.  Regularly check to see if handrails are loose or broken. Make sure that both sides of any steps have handrails.  Any raised decks and porches should have guardrails on the edges.  Have any leaves, snow, or ice cleared regularly.  Use sand or salt on walking paths during winter.  Clean up any spills in your garage right away. This includes oil or grease spills. What can I do in the bathroom?  Use night lights.  Install grab bars by the toilet and in the tub and shower. Do not use towel bars as grab bars.  Use non-skid mats or decals in the tub or shower.  If you need to sit down in the shower, use a plastic, non-slip stool.  Keep the floor dry. Clean up any water that spills on the floor as soon as it happens.  Remove soap buildup in the tub or shower regularly.  Attach bath mats securely with double-sided non-slip rug tape.  Do not have throw rugs and other things on the floor that can make  you trip. What can I do in the bedroom?  Use night lights.  Make sure that you have a light by your bed that is easy to reach.  Do not use any sheets or blankets that are too big for your bed. They should not hang down onto the floor.  Have a firm chair that has side arms. You can use this for support while you get dressed.  Do not have throw rugs and other things on the floor that can make you trip. What can I do in the kitchen?  Clean up any spills right away.  Avoid walking on wet floors.  Keep items that you use a lot in easy-to-reach places.  If you need to reach something above you, use a strong step stool that has a grab bar.  Keep electrical cords out of the way.  Do not use floor polish or wax that makes floors slippery. If you must use wax, use non-skid floor wax.  Do not have throw rugs and other things on the floor that can make you trip. What can I do with my stairs?  Do not leave any items on the stairs.  Make sure  that there are handrails on both sides of the stairs and use them. Fix handrails that are broken or loose. Make sure that handrails are as long as the stairways.  Check any carpeting to make sure that it is firmly attached to the stairs. Fix any carpet that is loose or worn.  Avoid having throw rugs at the top or bottom of the stairs. If you do have throw rugs, attach them to the floor with carpet tape.  Make sure that you have a light switch at the top of the stairs and the bottom of the stairs. If you do not have them, ask someone to add them for you. What else can I do to help prevent falls?  Wear shoes that:  Do not have high heels.  Have rubber bottoms.  Are comfortable and fit you well.  Are closed at the toe. Do not wear sandals.  If you use a stepladder:  Make sure that it is fully opened. Do not climb a closed stepladder.  Make sure that both sides of the stepladder are locked into place.  Ask someone to hold it for you, if  possible.  Clearly mark and make sure that you can see:  Any grab bars or handrails.  First and last steps.  Where the edge of each step is.  Use tools that help you move around (mobility aids) if they are needed. These include:  Canes.  Walkers.  Scooters.  Crutches.  Turn on the lights when you go into a dark area. Replace any light bulbs as soon as they burn out.  Set up your furniture so you have a clear path. Avoid moving your furniture around.  If any of your floors are uneven, fix them.  If there are any pets around you, be aware of where they are.  Review your medicines with your doctor. Some medicines can make you feel dizzy. This can increase your chance of falling. Ask your doctor what other things that you can do to help prevent falls. This information is not intended to replace advice given to you by your health care provider. Make sure you discuss any questions you have with your health care provider. Document Released: 05/30/2009 Document Revised: 01/09/2016 Document Reviewed: 09/07/2014 Elsevier Interactive Patient Education  2017 [employer].
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
## 2018-02-15 ENCOUNTER — Ambulatory Visit (INDEPENDENT_AMBULATORY_CARE_PROVIDER_SITE_OTHER): Payer: Medicare Other | Admitting: Orthopedic Surgery

## 2018-08-31 ENCOUNTER — Other Ambulatory Visit: Payer: Self-pay

## 2018-08-31 ENCOUNTER — Inpatient Hospital Stay (HOSPITAL_COMMUNITY)
Admission: EM | Admit: 2018-08-31 | Discharge: 2018-09-17 | DRG: 871 | Disposition: E | Payer: Medicare Other | Attending: Internal Medicine | Admitting: Internal Medicine

## 2018-08-31 ENCOUNTER — Emergency Department (HOSPITAL_COMMUNITY): Payer: Medicare Other

## 2018-08-31 DIAGNOSIS — Z880 Allergy status to penicillin: Secondary | ICD-10-CM

## 2018-08-31 DIAGNOSIS — E1165 Type 2 diabetes mellitus with hyperglycemia: Secondary | ICD-10-CM | POA: Diagnosis present

## 2018-08-31 DIAGNOSIS — I714 Abdominal aortic aneurysm, without rupture: Secondary | ICD-10-CM | POA: Diagnosis present

## 2018-08-31 DIAGNOSIS — X088XXA Exposure to other specified smoke, fire and flames, initial encounter: Secondary | ICD-10-CM | POA: Diagnosis present

## 2018-08-31 DIAGNOSIS — A419 Sepsis, unspecified organism: Secondary | ICD-10-CM

## 2018-08-31 DIAGNOSIS — Z794 Long term (current) use of insulin: Secondary | ICD-10-CM

## 2018-08-31 DIAGNOSIS — Z7982 Long term (current) use of aspirin: Secondary | ICD-10-CM

## 2018-08-31 DIAGNOSIS — Z79899 Other long term (current) drug therapy: Secondary | ICD-10-CM

## 2018-08-31 DIAGNOSIS — J440 Chronic obstructive pulmonary disease with acute lower respiratory infection: Secondary | ICD-10-CM | POA: Diagnosis present

## 2018-08-31 DIAGNOSIS — C801 Malignant (primary) neoplasm, unspecified: Secondary | ICD-10-CM | POA: Diagnosis present

## 2018-08-31 DIAGNOSIS — F17210 Nicotine dependence, cigarettes, uncomplicated: Secondary | ICD-10-CM | POA: Diagnosis present

## 2018-08-31 DIAGNOSIS — T23292A Burn of second degree of multiple sites of left wrist and hand, initial encounter: Secondary | ICD-10-CM | POA: Diagnosis present

## 2018-08-31 DIAGNOSIS — R739 Hyperglycemia, unspecified: Secondary | ICD-10-CM

## 2018-08-31 DIAGNOSIS — E876 Hypokalemia: Secondary | ICD-10-CM

## 2018-08-31 DIAGNOSIS — C787 Secondary malignant neoplasm of liver and intrahepatic bile duct: Secondary | ICD-10-CM | POA: Diagnosis present

## 2018-08-31 DIAGNOSIS — R748 Abnormal levels of other serum enzymes: Secondary | ICD-10-CM | POA: Diagnosis present

## 2018-08-31 DIAGNOSIS — H5461 Unqualified visual loss, right eye, normal vision left eye: Secondary | ICD-10-CM | POA: Diagnosis present

## 2018-08-31 DIAGNOSIS — Z9981 Dependence on supplemental oxygen: Secondary | ICD-10-CM

## 2018-08-31 DIAGNOSIS — R079 Chest pain, unspecified: Secondary | ICD-10-CM

## 2018-08-31 DIAGNOSIS — Z66 Do not resuscitate: Secondary | ICD-10-CM | POA: Diagnosis present

## 2018-08-31 DIAGNOSIS — Z515 Encounter for palliative care: Secondary | ICD-10-CM | POA: Diagnosis present

## 2018-08-31 DIAGNOSIS — Z89611 Acquired absence of right leg above knee: Secondary | ICD-10-CM

## 2018-08-31 DIAGNOSIS — T23291A Burn of second degree of multiple sites of right wrist and hand, initial encounter: Secondary | ICD-10-CM | POA: Diagnosis present

## 2018-08-31 DIAGNOSIS — K920 Hematemesis: Secondary | ICD-10-CM | POA: Diagnosis present

## 2018-08-31 DIAGNOSIS — I509 Heart failure, unspecified: Secondary | ICD-10-CM | POA: Diagnosis present

## 2018-08-31 DIAGNOSIS — I6340 Cerebral infarction due to embolism of unspecified cerebral artery: Secondary | ICD-10-CM | POA: Diagnosis present

## 2018-08-31 DIAGNOSIS — I110 Hypertensive heart disease with heart failure: Secondary | ICD-10-CM | POA: Diagnosis present

## 2018-08-31 DIAGNOSIS — S81802A Unspecified open wound, left lower leg, initial encounter: Secondary | ICD-10-CM

## 2018-08-31 DIAGNOSIS — X58XXXA Exposure to other specified factors, initial encounter: Secondary | ICD-10-CM | POA: Diagnosis present

## 2018-08-31 DIAGNOSIS — E119 Type 2 diabetes mellitus without complications: Secondary | ICD-10-CM

## 2018-08-31 DIAGNOSIS — J189 Pneumonia, unspecified organism: Secondary | ICD-10-CM | POA: Diagnosis present

## 2018-08-31 DIAGNOSIS — R0902 Hypoxemia: Secondary | ICD-10-CM

## 2018-08-31 DIAGNOSIS — J9601 Acute respiratory failure with hypoxia: Secondary | ICD-10-CM | POA: Diagnosis present

## 2018-08-31 DIAGNOSIS — C799 Secondary malignant neoplasm of unspecified site: Secondary | ICD-10-CM

## 2018-08-31 DIAGNOSIS — C7931 Secondary malignant neoplasm of brain: Secondary | ICD-10-CM | POA: Diagnosis present

## 2018-08-31 DIAGNOSIS — I613 Nontraumatic intracerebral hemorrhage in brain stem: Secondary | ICD-10-CM | POA: Diagnosis present

## 2018-08-31 DIAGNOSIS — J90 Pleural effusion, not elsewhere classified: Secondary | ICD-10-CM

## 2018-08-31 DIAGNOSIS — E1140 Type 2 diabetes mellitus with diabetic neuropathy, unspecified: Secondary | ICD-10-CM | POA: Diagnosis present

## 2018-08-31 DIAGNOSIS — Z833 Family history of diabetes mellitus: Secondary | ICD-10-CM

## 2018-08-31 DIAGNOSIS — E785 Hyperlipidemia, unspecified: Secondary | ICD-10-CM | POA: Diagnosis present

## 2018-08-31 LAB — CBC WITH DIFFERENTIAL/PLATELET
Abs Immature Granulocytes: 0.18 10*3/uL — ABNORMAL HIGH (ref 0.00–0.07)
BASOS PCT: 0 %
Basophils Absolute: 0 10*3/uL (ref 0.0–0.1)
EOS ABS: 0 10*3/uL (ref 0.0–0.5)
Eosinophils Relative: 0 %
HEMATOCRIT: 46.6 % (ref 39.0–52.0)
Hemoglobin: 15.5 g/dL (ref 13.0–17.0)
IMMATURE GRANULOCYTES: 1 %
Lymphocytes Relative: 3 %
Lymphs Abs: 0.5 10*3/uL — ABNORMAL LOW (ref 0.7–4.0)
MCH: 28.2 pg (ref 26.0–34.0)
MCHC: 33.3 g/dL (ref 30.0–36.0)
MCV: 84.7 fL (ref 80.0–100.0)
MONO ABS: 0.5 10*3/uL (ref 0.1–1.0)
MONOS PCT: 3 %
NEUTROS ABS: 16.4 10*3/uL — AB (ref 1.7–7.7)
NEUTROS PCT: 93 %
Platelets: 265 10*3/uL (ref 150–400)
RBC: 5.5 MIL/uL (ref 4.22–5.81)
RDW: 15.4 % (ref 11.5–15.5)
WBC MORPHOLOGY: INCREASED
WBC: 17.6 10*3/uL — ABNORMAL HIGH (ref 4.0–10.5)
nRBC: 0 % (ref 0.0–0.2)

## 2018-08-31 LAB — I-STAT VENOUS BLOOD GAS, ED
Acid-Base Excess: 2 mmol/L (ref 0.0–2.0)
Bicarbonate: 26.3 mmol/L (ref 20.0–28.0)
O2 Saturation: 79 %
TCO2: 28 mmol/L (ref 22–32)
pCO2, Ven: 40.5 mmHg — ABNORMAL LOW (ref 44.0–60.0)
pH, Ven: 7.421 (ref 7.250–7.430)
pO2, Ven: 43 mmHg (ref 32.0–45.0)

## 2018-08-31 LAB — BASIC METABOLIC PANEL
ANION GAP: 19 — AB (ref 5–15)
BUN: 26 mg/dL — ABNORMAL HIGH (ref 8–23)
CALCIUM: 8.3 mg/dL — AB (ref 8.9–10.3)
CO2: 23 mmol/L (ref 22–32)
Chloride: 89 mmol/L — ABNORMAL LOW (ref 98–111)
Creatinine, Ser: 0.9 mg/dL (ref 0.61–1.24)
GFR calc Af Amer: >60 mL/min (ref >60)
GLUCOSE: 402 mg/dL — AB (ref 70–99)
POTASSIUM: 3.5 mmol/L (ref 3.5–5.1)
SODIUM: 131 mmol/L — AB (ref 135–145)

## 2018-08-31 LAB — I-STAT CG4 LACTIC ACID, ED: Lactic Acid, Venous: 2.32 mmol/L (ref 0.5–1.9)

## 2018-08-31 LAB — TYPE AND SCREEN
ABO/RH(D): A POS
ANTIBODY SCREEN: NEGATIVE

## 2018-08-31 MED ORDER — MORPHINE SULFATE (PF) 4 MG/ML IV SOLN
4.0000 mg | Freq: Once | INTRAVENOUS | Status: AC
  Administered 2018-08-31: 4 mg via INTRAVENOUS
  Filled 2018-08-31: qty 1

## 2018-08-31 MED ORDER — VANCOMYCIN HCL 10 G IV SOLR
1500.0000 mg | INTRAVENOUS | Status: DC
  Administered 2018-09-02 – 2018-09-03 (×2): 1500 mg via INTRAVENOUS
  Filled 2018-08-31 (×3): qty 1500

## 2018-08-31 MED ORDER — SODIUM CHLORIDE 0.9 % IV SOLN
1.0000 g | Freq: Three times a day (TID) | INTRAVENOUS | Status: DC
  Administered 2018-09-01 – 2018-09-03 (×5): 1 g via INTRAVENOUS
  Filled 2018-08-31 (×8): qty 1

## 2018-08-31 MED ORDER — INSULIN ASPART 100 UNIT/ML ~~LOC~~ SOLN
10.0000 [IU] | Freq: Once | SUBCUTANEOUS | Status: AC
  Administered 2018-08-31: 10 [IU] via INTRAVENOUS

## 2018-08-31 MED ORDER — VANCOMYCIN HCL IN DEXTROSE 1-5 GM/200ML-% IV SOLN: 1000.0000 mg | Freq: Once | INTRAVENOUS | Status: DC

## 2018-08-31 MED ORDER — SODIUM CHLORIDE 0.9 % IV BOLUS
1000.0000 mL | Freq: Once | INTRAVENOUS | Status: AC
  Administered 2018-08-31: 1000 mL via INTRAVENOUS

## 2018-08-31 MED ORDER — METRONIDAZOLE IN NACL 5-0.79 MG/ML-% IV SOLN
500.0000 mg | Freq: Three times a day (TID) | INTRAVENOUS | Status: DC
  Administered 2018-08-31 – 2018-09-03 (×8): 500 mg via INTRAVENOUS
  Filled 2018-08-31 (×8): qty 100

## 2018-08-31 MED ORDER — ONDANSETRON HCL 4 MG/2ML IJ SOLN
4.0000 mg | Freq: Once | INTRAMUSCULAR | Status: AC
  Administered 2018-08-31: 4 mg via INTRAVENOUS
  Filled 2018-08-31: qty 2

## 2018-08-31 MED ORDER — VANCOMYCIN HCL 10 G IV SOLR
1500.0000 mg | Freq: Once | INTRAVENOUS | Status: AC
  Administered 2018-09-01: 1500 mg via INTRAVENOUS
  Filled 2018-08-31: qty 1500

## 2018-08-31 MED ORDER — SODIUM CHLORIDE 0.9 % IV SOLN
2.0000 g | Freq: Once | INTRAVENOUS | Status: AC
  Administered 2018-08-31: 2 g via INTRAVENOUS
  Filled 2018-08-31: qty 2

## 2018-08-31 NOTE — ED Triage Notes (Signed)
EMS called for CP and vomiting. Patient actively vomiting blood. Patient has hx of cancer.

## 2018-08-31 NOTE — Progress Notes (Signed)
Pharmacy Antibiotic Note  Andrew [name] is a 73 y.o. male admitted on 08/28/2018 with hematemesis.  Pharmacy has been consulted for vancomycin and cefepime dosing for sepsis.  SCr 0.9, CrCL 68 ml/min, afebrile, WBC 17.6.  Plan: Vanc 1500mg  IV Q24H for AUC 467 using SCr 0.9 Cefepime 2gm IV x 1, then 1gm IV Q8H Flagyl 500mg  IV Q8H per MD Monitor renal fxn, clinical progress, vanc AUC as indicated F/U updated height and weight   Height: 5\' 7"  (170.2 cm) Weight: 160 lb 15 oz (73 kg) IBW/kg (Calculated) : 66.1  Temp (24hrs), Avg:94 F (34.4 C), Min:94 F (34.4 C), Max:94 F (34.4 C)  Recent Labs  Lab 09/07/2018 2100 09/08/2018 2232  WBC 17.6*  --   CREATININE 0.90  --   LATICACIDVEN  --  2.32*    Estimated Creatinine Clearance: 68.3 mL/min (by C-G formula based on SCr of 0.9 mg/dL).    Allergies  Allergen Reactions  . Penicillins Rash and Other (See Comments)    Tolerated Zosyn Jan 2018  PATIENT HAS HAD A PCN REACTION WITH IMMEDIATE RASH, FACIAL/TONGUE/THROAT SWELLING, SOB, OR LIGHTHEADEDNESS WITH HYPOTENSION:  #  #  #  YES  #  #  #   Has patient had a PCN reaction causing severe rash involving mucus membranes or skin necrosis:NO Has patient had a PCN reaction that required hospitalization NO Has patient had a PCN reaction occurring within the last 10 years:NO    Vanc 1/15 >> Cefepime 1/15 >>  1/15 BCx -   Fareeha Evon D. Mina Marble, PharmD, BCPS, [place] 08/26/2018, 10:52 PM

## 2018-08-31 NOTE — ED Notes (Signed)
Notified Dr. Rex Kras of elevated lactic acid
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
## 2018-09-01 ENCOUNTER — Inpatient Hospital Stay (HOSPITAL_COMMUNITY): Payer: Medicare Other

## 2018-09-01 ENCOUNTER — Emergency Department (HOSPITAL_COMMUNITY): Payer: Medicare Other

## 2018-09-01 DIAGNOSIS — X088XXA Exposure to other specified smoke, fire and flames, initial encounter: Secondary | ICD-10-CM | POA: Diagnosis present

## 2018-09-01 DIAGNOSIS — E119 Type 2 diabetes mellitus without complications: Secondary | ICD-10-CM

## 2018-09-01 DIAGNOSIS — K920 Hematemesis: Secondary | ICD-10-CM | POA: Diagnosis not present

## 2018-09-01 DIAGNOSIS — Z9981 Dependence on supplemental oxygen: Secondary | ICD-10-CM | POA: Diagnosis not present

## 2018-09-01 DIAGNOSIS — Z7189 Other specified counseling: Secondary | ICD-10-CM | POA: Diagnosis not present

## 2018-09-01 DIAGNOSIS — Z515 Encounter for palliative care: Secondary | ICD-10-CM | POA: Diagnosis not present

## 2018-09-01 DIAGNOSIS — T23292A Burn of second degree of multiple sites of left wrist and hand, initial encounter: Secondary | ICD-10-CM | POA: Diagnosis present

## 2018-09-01 DIAGNOSIS — C799 Secondary malignant neoplasm of unspecified site: Secondary | ICD-10-CM

## 2018-09-01 DIAGNOSIS — S81802A Unspecified open wound, left lower leg, initial encounter: Secondary | ICD-10-CM | POA: Diagnosis present

## 2018-09-01 DIAGNOSIS — C787 Secondary malignant neoplasm of liver and intrahepatic bile duct: Secondary | ICD-10-CM | POA: Diagnosis present

## 2018-09-01 DIAGNOSIS — I509 Heart failure, unspecified: Secondary | ICD-10-CM | POA: Diagnosis present

## 2018-09-01 DIAGNOSIS — R748 Abnormal levels of other serum enzymes: Secondary | ICD-10-CM

## 2018-09-01 DIAGNOSIS — R739 Hyperglycemia, unspecified: Secondary | ICD-10-CM | POA: Diagnosis present

## 2018-09-01 DIAGNOSIS — I110 Hypertensive heart disease with heart failure: Secondary | ICD-10-CM | POA: Diagnosis present

## 2018-09-01 DIAGNOSIS — C801 Malignant (primary) neoplasm, unspecified: Secondary | ICD-10-CM | POA: Diagnosis present

## 2018-09-01 DIAGNOSIS — I613 Nontraumatic intracerebral hemorrhage in brain stem: Secondary | ICD-10-CM | POA: Diagnosis present

## 2018-09-01 DIAGNOSIS — J440 Chronic obstructive pulmonary disease with acute lower respiratory infection: Secondary | ICD-10-CM | POA: Diagnosis present

## 2018-09-01 DIAGNOSIS — X58XXXA Exposure to other specified factors, initial encounter: Secondary | ICD-10-CM | POA: Diagnosis present

## 2018-09-01 DIAGNOSIS — I6340 Cerebral infarction due to embolism of unspecified cerebral artery: Secondary | ICD-10-CM | POA: Diagnosis present

## 2018-09-01 DIAGNOSIS — E785 Hyperlipidemia, unspecified: Secondary | ICD-10-CM | POA: Diagnosis present

## 2018-09-01 DIAGNOSIS — J9601 Acute respiratory failure with hypoxia: Secondary | ICD-10-CM

## 2018-09-01 DIAGNOSIS — A419 Sepsis, unspecified organism: Secondary | ICD-10-CM | POA: Diagnosis not present

## 2018-09-01 DIAGNOSIS — E1165 Type 2 diabetes mellitus with hyperglycemia: Secondary | ICD-10-CM | POA: Diagnosis present

## 2018-09-01 DIAGNOSIS — J189 Pneumonia, unspecified organism: Secondary | ICD-10-CM | POA: Diagnosis present

## 2018-09-01 DIAGNOSIS — Z66 Do not resuscitate: Secondary | ICD-10-CM | POA: Diagnosis present

## 2018-09-01 DIAGNOSIS — J90 Pleural effusion, not elsewhere classified: Secondary | ICD-10-CM

## 2018-09-01 DIAGNOSIS — E876 Hypokalemia: Secondary | ICD-10-CM | POA: Diagnosis present

## 2018-09-01 DIAGNOSIS — I714 Abdominal aortic aneurysm, without rupture: Secondary | ICD-10-CM | POA: Diagnosis present

## 2018-09-01 DIAGNOSIS — E1140 Type 2 diabetes mellitus with diabetic neuropathy, unspecified: Secondary | ICD-10-CM | POA: Diagnosis present

## 2018-09-01 DIAGNOSIS — C7931 Secondary malignant neoplasm of brain: Secondary | ICD-10-CM | POA: Diagnosis present

## 2018-09-01 DIAGNOSIS — T23291A Burn of second degree of multiple sites of right wrist and hand, initial encounter: Secondary | ICD-10-CM | POA: Diagnosis present

## 2018-09-01 LAB — I-STAT CHEM 8, ED
BUN: 28 mg/dL — ABNORMAL HIGH (ref 8–23)
Calcium, Ion: 1.09 mmol/L — ABNORMAL LOW (ref 1.15–1.40)
Chloride: 94 mmol/L — ABNORMAL LOW (ref 98–111)
Creatinine, Ser: 0.6 mg/dL — ABNORMAL LOW (ref 0.61–1.24)
Glucose, Bld: 314 mg/dL — ABNORMAL HIGH (ref 70–99)
HCT: 44 % (ref 39.0–52.0)
Hemoglobin: 15 g/dL (ref 13.0–17.0)
Potassium: 2.9 mmol/L — ABNORMAL LOW (ref 3.5–5.1)
SODIUM: 131 mmol/L — AB (ref 135–145)
TCO2: 23 mmol/L (ref 22–32)

## 2018-09-01 LAB — HEPATIC FUNCTION PANEL
ALT: 21 U/L (ref 0–44)
AST: 22 U/L (ref 15–41)
Albumin: 1.6 g/dL — ABNORMAL LOW (ref 3.5–5.0)
Alkaline Phosphatase: 208 U/L — ABNORMAL HIGH (ref 38–126)
Bilirubin, Direct: <0.1 mg/dL (ref 0.0–0.2)
Total Bilirubin: 1 mg/dL (ref 0.3–1.2)
Total Protein: 5.1 g/dL — ABNORMAL LOW (ref 6.5–8.1)

## 2018-09-01 LAB — COMPREHENSIVE METABOLIC PANEL
ALT: 19 U/L (ref 0–44)
AST: 19 U/L (ref 15–41)
Albumin: 1.5 g/dL — ABNORMAL LOW (ref 3.5–5.0)
Alkaline Phosphatase: 175 U/L — ABNORMAL HIGH (ref 38–126)
Anion gap: 15 (ref 5–15)
BILIRUBIN TOTAL: 0.7 mg/dL (ref 0.3–1.2)
BUN: 29 mg/dL — ABNORMAL HIGH (ref 8–23)
CO2: 22 mmol/L (ref 22–32)
Calcium: 8 mg/dL — ABNORMAL LOW (ref 8.9–10.3)
Chloride: 96 mmol/L — ABNORMAL LOW (ref 98–111)
Creatinine, Ser: 0.78 mg/dL (ref 0.61–1.24)
GFR calc Af Amer: >60 mL/min (ref >60)
GFR calc non Af Amer: >60 mL/min (ref >60)
Glucose, Bld: 292 mg/dL — ABNORMAL HIGH (ref 70–99)
Potassium: 3.2 mmol/L — ABNORMAL LOW (ref 3.5–5.1)
Sodium: 133 mmol/L — ABNORMAL LOW (ref 135–145)
Total Protein: 4.7 g/dL — ABNORMAL LOW (ref 6.5–8.1)

## 2018-09-01 LAB — I-STAT ARTERIAL BLOOD GAS, ED
Acid-base deficit: 2 mmol/L (ref 0.0–2.0)
Bicarbonate: 23.6 mmol/L (ref 20.0–28.0)
O2 Saturation: 93 %
Patient temperature: 98
TCO2: 25 mmol/L (ref 22–32)
pCO2 arterial: 43.7 mmHg (ref 32.0–48.0)
pH, Arterial: 7.339 — ABNORMAL LOW (ref 7.350–7.450)
pO2, Arterial: 71 mmHg — ABNORMAL LOW (ref 83.0–108.0)

## 2018-09-01 LAB — CBC
HCT: 44 % (ref 39.0–52.0)
HEMOGLOBIN: 14.8 g/dL (ref 13.0–17.0)
MCH: 28.4 pg (ref 26.0–34.0)
MCHC: 33.6 g/dL (ref 30.0–36.0)
MCV: 84.3 fL (ref 80.0–100.0)
Platelets: 264 10*3/uL (ref 150–400)
RBC: 5.22 MIL/uL (ref 4.22–5.81)
RDW: 15.3 % (ref 11.5–15.5)
WBC: 17.7 10*3/uL — ABNORMAL HIGH (ref 4.0–10.5)
nRBC: 0 % (ref 0.0–0.2)

## 2018-09-01 LAB — PROTIME-INR
INR: 0.93
Prothrombin Time: 12.3 seconds (ref 11.4–15.2)

## 2018-09-01 LAB — LACTIC ACID, PLASMA: Lactic Acid, Venous: 1.2 mmol/L (ref 0.5–1.9)

## 2018-09-01 LAB — I-STAT CG4 LACTIC ACID, ED
Lactic Acid, Venous: 1.96 mmol/L — ABNORMAL HIGH (ref 0.5–1.9)
Lactic Acid, Venous: 2.31 mmol/L (ref 0.5–1.9)

## 2018-09-01 LAB — GLUCOSE, CAPILLARY
Glucose-Capillary: 269 mg/dL — ABNORMAL HIGH (ref 70–99)
Glucose-Capillary: 211 mg/dL — ABNORMAL HIGH (ref 70–99)
Glucose-Capillary: 162 mg/dL — ABNORMAL HIGH (ref 70–99)
Glucose-Capillary: 143 mg/dL — ABNORMAL HIGH (ref 70–99)

## 2018-09-01 LAB — MRSA PCR SCREENING: MRSA by PCR: POSITIVE — AB

## 2018-09-01 LAB — TROPONIN I
Troponin I: 0.03 ng/mL (ref <0.03)
Troponin I: 0.03 ng/mL (ref <0.03)
Troponin I: 0.03 ng/mL (ref <0.03)

## 2018-09-01 LAB — I-STAT TROPONIN, ED: TROPONIN I, POC: 0.04 ng/mL (ref 0.00–0.08)

## 2018-09-01 LAB — MAGNESIUM: Magnesium: 1.7 mg/dL (ref 1.7–2.4)

## 2018-09-01 MED ORDER — PANTOPRAZOLE SODIUM 40 MG IV SOLR: 40.0000 mg | Freq: Two times a day (BID) | INTRAVENOUS | Status: DC

## 2018-09-01 MED ORDER — INSULIN ASPART 100 UNIT/ML ~~LOC~~ SOLN
0.0000 [IU] | SUBCUTANEOUS | Status: DC
  Administered 2018-09-01: 5 [IU] via SUBCUTANEOUS
  Administered 2018-09-01: 1 [IU] via SUBCUTANEOUS
  Administered 2018-09-01: 2 [IU] via SUBCUTANEOUS
  Administered 2018-09-01: 3 [IU] via SUBCUTANEOUS
  Administered 2018-09-02: 2 [IU] via SUBCUTANEOUS
  Administered 2018-09-02 – 2018-09-03 (×4): 1 [IU] via SUBCUTANEOUS

## 2018-09-01 MED ORDER — POTASSIUM CHLORIDE 10 MEQ/100ML IV SOLN
10.0000 meq | INTRAVENOUS | Status: AC
  Administered 2018-09-01 (×4): 10 meq via INTRAVENOUS
  Filled 2018-09-01 (×3): qty 100

## 2018-09-01 MED ORDER — SODIUM CHLORIDE 0.9 % IV SOLN
INTRAVENOUS | Status: DC
  Administered 2018-09-01 (×2): via INTRAVENOUS

## 2018-09-01 MED ORDER — MUPIROCIN 2 % EX OINT
1.0000 "application " | TOPICAL_OINTMENT | Freq: Two times a day (BID) | CUTANEOUS | Status: DC
  Administered 2018-09-01 – 2018-09-03 (×5): 1 via NASAL
  Filled 2018-09-01: qty 22

## 2018-09-01 MED ORDER — INSULIN ASPART 100 UNIT/ML ~~LOC~~ SOLN: 0.0000 [IU] | Freq: Three times a day (TID) | SUBCUTANEOUS | Status: DC

## 2018-09-01 MED ORDER — SODIUM CHLORIDE 0.9 % IV SOLN
80.0000 mg | Freq: Once | INTRAVENOUS | Status: AC
  Administered 2018-09-01: 80 mg via INTRAVENOUS
  Filled 2018-09-01: qty 80

## 2018-09-01 MED ORDER — ONDANSETRON HCL 4 MG/2ML IJ SOLN
4.0000 mg | Freq: Four times a day (QID) | INTRAMUSCULAR | Status: DC | PRN
  Administered 2018-09-02: 4 mg via INTRAVENOUS
  Filled 2018-09-01: qty 2

## 2018-09-01 MED ORDER — SODIUM CHLORIDE 0.9 % IV SOLN
INTRAVENOUS | Status: DC
  Administered 2018-09-01 – 2018-09-03 (×5): via INTRAVENOUS

## 2018-09-01 MED ORDER — SODIUM CHLORIDE 0.9 % IV BOLUS
1000.0000 mL | Freq: Once | INTRAVENOUS | Status: AC
  Administered 2018-09-01: 1000 mL via INTRAVENOUS

## 2018-09-01 MED ORDER — POTASSIUM CHLORIDE 10 MEQ/100ML IV SOLN
INTRAVENOUS | Status: AC
  Administered 2018-09-01: 10 meq via INTRAVENOUS
  Filled 2018-09-01: qty 100

## 2018-09-01 MED ORDER — CHLORHEXIDINE GLUCONATE CLOTH 2 % EX PADS
6.0000 | MEDICATED_PAD | Freq: Every day | CUTANEOUS | Status: DC
  Administered 2018-09-01 – 2018-09-02 (×2): 6 via TOPICAL

## 2018-09-01 MED ORDER — SODIUM CHLORIDE 0.9 % IV SOLN
8.0000 mg/h | INTRAVENOUS | Status: DC
  Administered 2018-09-01 – 2018-09-03 (×5): 8 mg/h via INTRAVENOUS
  Filled 2018-09-01 (×8): qty 80
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
## 2018-09-01 NOTE — ED Notes (Signed)
Per Gwynneth Macleod, Charge nurse has not approved this patient for this floor. States will call me back.

## 2018-09-01 NOTE — Progress Notes (Signed)
Patient's daughter Jeanett Schlein called and stated that she had left a message for Tomi Bamberger, CM and would await a return phone call.

## 2018-09-01 NOTE — Care Management Note (Addendum)
Case Management Note  Patient Details  Name: Andrew [name] MRN: [id number] Date of Birth: 04-15-1946  Subjective/Objective:    From home with [facility], with his son and his fiance. Lawerance Bach is the Hospice RN.  Plan is to return home via ambulance at discharge and to resume [place],  he is in Hospice for Malnutrition per Hospice rep. Per Olean Ree, Son has hx of drug abuse so they don't want a lot of pain meds in the home.  Jeanett Schlein the daughter  Is the primary care giver, but patient can make his own decisions.  Per Larena Glassman with Hospice they sometimes will take pain meds to [facility] at work so that she will have them to give to patient.                   Action/Plan: DC home with hospice when ready.   Expected Discharge Date:                  Expected Discharge Plan:  Home w Hospice Care  In-House Referral:     Discharge planning Services  CM Consult  Post Acute Care Choice:    Choice offered to:     DME Arranged:    DME Agency:     HH Arranged:    [place] Agency:     Status of Service:  In process, will continue to follow  If discussed at Long Length of Stay Meetings, dates discussed:    Additional Comments:  Zenon Mayo, RN 09/01/2018, 1:15 PM
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
## 2018-09-02 DIAGNOSIS — Z515 Encounter for palliative care: Secondary | ICD-10-CM

## 2018-09-02 DIAGNOSIS — K920 Hematemesis: Secondary | ICD-10-CM

## 2018-09-02 DIAGNOSIS — Z7189 Other specified counseling: Secondary | ICD-10-CM

## 2018-09-02 LAB — GLUCOSE, CAPILLARY
GLUCOSE-CAPILLARY: 112 mg/dL — AB (ref 70–99)
Glucose-Capillary: 106 mg/dL — ABNORMAL HIGH (ref 70–99)
Glucose-Capillary: 122 mg/dL — ABNORMAL HIGH (ref 70–99)
Glucose-Capillary: 164 mg/dL — ABNORMAL HIGH (ref 70–99)
Glucose-Capillary: 124 mg/dL — ABNORMAL HIGH (ref 70–99)
Glucose-Capillary: 118 mg/dL — ABNORMAL HIGH (ref 70–99)
Glucose-Capillary: 124 mg/dL — ABNORMAL HIGH (ref 70–99)

## 2018-09-02 LAB — BASIC METABOLIC PANEL
Anion gap: 10 (ref 5–15)
BUN: 22 mg/dL (ref 8–23)
CO2: 23 mmol/L (ref 22–32)
Calcium: 7.7 mg/dL — ABNORMAL LOW (ref 8.9–10.3)
Chloride: 103 mmol/L (ref 98–111)
Creatinine, Ser: 0.65 mg/dL (ref 0.61–1.24)
GFR calc Af Amer: >60 mL/min (ref >60)
GFR calc non Af Amer: >60 mL/min (ref >60)
Glucose, Bld: 114 mg/dL — ABNORMAL HIGH (ref 70–99)
Potassium: 3.3 mmol/L — ABNORMAL LOW (ref 3.5–5.1)
SODIUM: 136 mmol/L (ref 135–145)

## 2018-09-02 LAB — CBC
HCT: 41.1 % (ref 39.0–52.0)
Hemoglobin: 14 g/dL (ref 13.0–17.0)
MCH: 28.9 pg (ref 26.0–34.0)
MCHC: 34.1 g/dL (ref 30.0–36.0)
MCV: 84.7 fL (ref 80.0–100.0)
Platelets: 275 10*3/uL (ref 150–400)
RBC: 4.85 MIL/uL (ref 4.22–5.81)
RDW: 15.9 % — ABNORMAL HIGH (ref 11.5–15.5)
WBC: 18.3 10*3/uL — ABNORMAL HIGH (ref 4.0–10.5)
nRBC: 0 % (ref 0.0–0.2)

## 2018-09-02 MED ORDER — MORPHINE SULFATE (PF) 2 MG/ML IV SOLN
1.0000 mg | INTRAVENOUS | Status: DC | PRN
  Administered 2018-09-02: 1 mg via INTRAVENOUS
  Filled 2018-09-02: qty 1

## 2018-09-02 MED ORDER — MORPHINE SULFATE (PF) 2 MG/ML IV SOLN
2.0000 mg | Freq: Once | INTRAVENOUS | Status: AC
  Administered 2018-09-02: 2 mg via INTRAVENOUS
  Filled 2018-09-02: qty 1

## 2018-09-02 MED ORDER — POTASSIUM CHLORIDE 10 MEQ/100ML IV SOLN
10.0000 meq | INTRAVENOUS | Status: AC
  Administered 2018-09-02 (×4): 10 meq via INTRAVENOUS
  Filled 2018-09-02 (×4): qty 100

## 2018-09-02 MED ORDER — MORPHINE SULFATE (PF) 2 MG/ML IV SOLN
2.0000 mg | INTRAVENOUS | Status: DC | PRN
  Administered 2018-09-02: 1 mg via INTRAVENOUS
  Administered 2018-09-03: 2 mg via INTRAVENOUS
  Filled 2018-09-02: qty 1

## 2018-09-02 NOTE — Plan of Care (Addendum)
PMT note:   Patient is confused and difficult to understand. He does not know why he is in hospice care and is not able to discuss [place]. Will reach out to family for [place] meeting.
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
## 2018-09-02 NOTE — Consult Note (Addendum)
[place] Nurse wound consult note Patient receiving care in [facility] 2W11.  No family present. Reason for Consult: LLE wound Wound type: Unknown etiology POA: Yes Measurement: 13.5 cm x 3 cm x unknown depth due to necrotic tissue in wound bed. Wound bed: 50% pink, 50% yellow/brown thick slough Drainage (amount, consistency, odor) Serous drainage on existing dry dressing. Periwound: Erythematous Dressing procedure/placement/frequency: Apply Saline Moistened Gauze INTO the wound bed on the LLE. Cover with ABD pads. Secure in place with kerlex.  This is the therapy selected in an effort to provide some debridement of the wound, and within the setting of the patient's existing Home Hospice care and current Palliative Care consult. Thank you for the consult.  Discussed plan of care with the patient and bedside nurse.  [place] nurse will not follow at this time.  Please re-consult the [place] team if needed.  Val Riles, RN, MSN, CWOCN, CNS-BC, pager [phone]

## 2018-09-03 LAB — BASIC METABOLIC PANEL
Anion gap: 14 (ref 5–15)
BUN: 16 mg/dL (ref 8–23)
CO2: 19 mmol/L — ABNORMAL LOW (ref 22–32)
Calcium: 7.6 mg/dL — ABNORMAL LOW (ref 8.9–10.3)
Chloride: 108 mmol/L (ref 98–111)
Creatinine, Ser: 0.66 mg/dL (ref 0.61–1.24)
GFR calc Af Amer: >60 mL/min (ref >60)
GFR calc non Af Amer: >60 mL/min (ref >60)
Glucose, Bld: 132 mg/dL — ABNORMAL HIGH (ref 70–99)
Potassium: 3.3 mmol/L — ABNORMAL LOW (ref 3.5–5.1)
Sodium: 141 mmol/L (ref 135–145)

## 2018-09-03 LAB — CBC
HCT: 43.1 % (ref 39.0–52.0)
Hemoglobin: 14.2 g/dL (ref 13.0–17.0)
MCH: 28 pg (ref 26.0–34.0)
MCHC: 32.9 g/dL (ref 30.0–36.0)
MCV: 84.8 fL (ref 80.0–100.0)
Platelets: 260 10*3/uL (ref 150–400)
RBC: 5.08 MIL/uL (ref 4.22–5.81)
RDW: 16.2 % — ABNORMAL HIGH (ref 11.5–15.5)
WBC: 16.4 10*3/uL — ABNORMAL HIGH (ref 4.0–10.5)
nRBC: 0 % (ref 0.0–0.2)

## 2018-09-03 LAB — GLUCOSE, CAPILLARY
Glucose-Capillary: 123 mg/dL — ABNORMAL HIGH (ref 70–99)
Glucose-Capillary: 149 mg/dL — ABNORMAL HIGH (ref 70–99)

## 2018-09-03 MED ORDER — MORPHINE SULFATE (PF) 2 MG/ML IV SOLN
2.0000 mg | INTRAVENOUS | Status: DC | PRN
  Administered 2018-09-03 – 2018-09-04 (×4): 2 mg via INTRAVENOUS
  Filled 2018-09-03 (×4): qty 1

## 2018-09-03 MED ORDER — GLYCOPYRROLATE 0.2 MG/ML IJ SOLN
0.2000 mg | Freq: Three times a day (TID) | INTRAMUSCULAR | Status: DC | PRN
  Administered 2018-09-03 (×2): 0.2 mg via INTRAVENOUS
  Filled 2018-09-03 (×2): qty 1

## 2018-09-03 MED ORDER — MORPHINE SULFATE (PF) 2 MG/ML IV SOLN
2.0000 mg | INTRAVENOUS | Status: DC | PRN
  Administered 2018-09-03: 3 mg via INTRAVENOUS
  Filled 2018-09-03: qty 2

## 2018-09-03 MED ORDER — LORAZEPAM 2 MG/ML IJ SOLN
1.0000 mg | INTRAMUSCULAR | Status: DC | PRN
  Administered 2018-09-03 – 2018-09-04 (×3): 1 mg via INTRAVENOUS
  Filled 2018-09-03 (×3): qty 1

## 2018-09-03 MED ORDER — MORPHINE SULFATE (PF) 2 MG/ML IV SOLN
2.0000 mg | INTRAVENOUS | Status: DC | PRN
  Administered 2018-09-03: 4 mg via INTRAVENOUS
  Filled 2018-09-03: qty 2

## 2018-09-03 NOTE — Progress Notes (Signed)
Son Andrew [name] is at the bedside with his fiance.  MD Bodenheimer paged so he is aware.
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
## 2018-09-03 NOTE — Progress Notes (Signed)
Patient began desaturating to 70's and 80's around 2030 this past evening and respiratory notified and placed him on HFNC from 6 L [place].  MD aware and agreeable as patient is a DNR, however, plans are made to meet with family tomorrow to discuss further goals of care.  Also, patient continued to complain of ongoing chest pain and lower left leg pain rating his patient at a 10/10, moaning.  PRN morphine given, however, no relief after 20 minutes, MD notified to request increased dose/ and / or frequency.  Additional mg of Morphine given and patient was able to rest for several hours.  Patient's wound dressing was changed and saline gauze placed in deep full thickness wound.  Strong odor from wound noted with purulent drainage.  No family at bedside, no family calls as of this time.

## 2018-09-03 NOTE — Progress Notes (Signed)
Spoke with Dr. Silas Sacramento who attempted to reach family as patient is on 15 L HFNC at this time and saturation of mid 25's.  I called daughter Madlyn Frankel, and spoke with her, and who has spoken with her brother.  The brother told her that he spoke with his father yesterday afternoon and that the patient said he was ready to die.  Madlyn Frankel said that they do not want to put him on any further breathing assistance and she agreed the most important thing at this time was to make him comfortable.  I discussed his current pain medication orders and she was agreeable to the Morphine every 3 hours.  We will continue to assess for pain and comfort.

## 2018-09-05 LAB — CULTURE, BLOOD (ROUTINE X 2)
Culture: NO GROWTH
Special Requests: ADEQUATE
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
## 2018-09-06 LAB — CULTURE, BLOOD (ROUTINE X 2)
Culture: NO GROWTH
Special Requests: ADEQUATE

## 2018-09-17 NOTE — Progress Notes (Signed)
Time of death 62, confirmed by Hiilei Gerst M. Madoline Bhatt, Therapist, sports, BSN and Joya Salm, Therapist, sports. Dr. Maylene Roes notified. Patient's daughter, Rudolpho Sevin, at the bedside.

## 2018-09-17 NOTE — Progress Notes (Signed)
Patient condition deteriorating further. He is now cheyne-stoke breathing. He shows minimal response to voice. Observed moving his hand and opened his eyes once. Emotional support provided to daughter at the bedside.
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
## 2018-09-17 DEATH — deceased
